# Patient Record
Sex: Male | Born: 1960 | Race: White | Hispanic: No | Marital: Married | Smoking: Never smoker
Health system: Southern US, Community
[De-identification: ages and names within clinical notes are randomized; demographics above are authoritative.]

## PROBLEM LIST (undated history)

## (undated) DIAGNOSIS — R1084 Generalized abdominal pain: Secondary | ICD-10-CM

## (undated) DIAGNOSIS — K759 Inflammatory liver disease, unspecified: Secondary | ICD-10-CM

## (undated) DIAGNOSIS — E7800 Pure hypercholesterolemia, unspecified: Secondary | ICD-10-CM

## (undated) DIAGNOSIS — C801 Malignant (primary) neoplasm, unspecified: Secondary | ICD-10-CM

## (undated) DIAGNOSIS — K219 Gastro-esophageal reflux disease without esophagitis: Secondary | ICD-10-CM

## (undated) DIAGNOSIS — I739 Peripheral vascular disease, unspecified: Secondary | ICD-10-CM

## (undated) DIAGNOSIS — E785 Hyperlipidemia, unspecified: Secondary | ICD-10-CM

## (undated) DIAGNOSIS — E43 Unspecified severe protein-calorie malnutrition: Secondary | ICD-10-CM

## (undated) DIAGNOSIS — J189 Pneumonia, unspecified organism: Secondary | ICD-10-CM

## (undated) DIAGNOSIS — Z8614 Personal history of Methicillin resistant Staphylococcus aureus infection: Secondary | ICD-10-CM

## (undated) DIAGNOSIS — Z98811 Dental restoration status: Secondary | ICD-10-CM

## (undated) DIAGNOSIS — R221 Localized swelling, mass and lump, neck: Secondary | ICD-10-CM

## (undated) DIAGNOSIS — I10 Essential (primary) hypertension: Secondary | ICD-10-CM

## (undated) DIAGNOSIS — J302 Other seasonal allergic rhinitis: Secondary | ICD-10-CM

## (undated) DIAGNOSIS — E559 Vitamin D deficiency, unspecified: Secondary | ICD-10-CM

## (undated) DIAGNOSIS — R011 Cardiac murmur, unspecified: Secondary | ICD-10-CM

## (undated) DIAGNOSIS — R2231 Localized swelling, mass and lump, right upper limb: Secondary | ICD-10-CM

## (undated) DIAGNOSIS — Z8619 Personal history of other infectious and parasitic diseases: Secondary | ICD-10-CM

## (undated) DIAGNOSIS — Z973 Presence of spectacles and contact lenses: Secondary | ICD-10-CM

## (undated) DIAGNOSIS — E039 Hypothyroidism, unspecified: Secondary | ICD-10-CM

## (undated) HISTORY — DX: Hyperlipidemia, unspecified: E78.5

## (undated) HISTORY — DX: Hypothyroidism, unspecified: E03.9

## (undated) HISTORY — DX: Cardiac murmur, unspecified: R01.1

## (undated) HISTORY — PX: HYDROCELE EXCISION: SHX482

## (undated) HISTORY — DX: Essential (primary) hypertension: I10

## (undated) HISTORY — DX: Pure hypercholesterolemia, unspecified: E78.00

---
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
## 1998-03-13 ENCOUNTER — Ambulatory Visit (HOSPITAL_COMMUNITY): Admission: RE | Admit: 1998-03-13 | Discharge: 1998-03-13 | Payer: Self-pay | Admitting: *Deleted

## 2000-05-07 ENCOUNTER — Ambulatory Visit (HOSPITAL_COMMUNITY): Admission: RE | Admit: 2000-05-07 | Discharge: 2000-05-07 | Payer: Self-pay | Admitting: Gastroenterology

## 2000-05-07 ENCOUNTER — Encounter: Payer: Self-pay | Admitting: Gastroenterology

## 2001-08-03 ENCOUNTER — Encounter: Payer: Self-pay | Admitting: Surgery

## 2001-08-03 ENCOUNTER — Encounter: Admission: RE | Admit: 2001-08-03 | Discharge: 2001-08-03 | Payer: Self-pay | Admitting: Surgery

## 2003-04-16 HISTORY — PX: HYDROCELE EXCISION: SHX482

## 2004-01-06 ENCOUNTER — Ambulatory Visit (HOSPITAL_BASED_OUTPATIENT_CLINIC_OR_DEPARTMENT_OTHER): Admission: RE | Admit: 2004-01-06 | Discharge: 2004-01-06 | Payer: Self-pay | Admitting: Urology

## 2004-01-06 ENCOUNTER — Ambulatory Visit (HOSPITAL_COMMUNITY): Admission: RE | Admit: 2004-01-06 | Discharge: 2004-01-06 | Payer: Self-pay | Admitting: Urology

## 2010-11-15 ENCOUNTER — Other Ambulatory Visit: Payer: Self-pay | Admitting: Emergency Medicine

## 2012-05-27 ENCOUNTER — Other Ambulatory Visit: Payer: Self-pay | Admitting: Internal Medicine

## 2012-05-27 DIAGNOSIS — R7989 Other specified abnormal findings of blood chemistry: Secondary | ICD-10-CM

## 2012-06-01 ENCOUNTER — Other Ambulatory Visit: Payer: Self-pay

## 2013-02-11 ENCOUNTER — Encounter: Payer: Self-pay | Admitting: Internal Medicine

## 2013-02-22 ENCOUNTER — Ambulatory Visit: Payer: BC Managed Care – PPO | Admitting: Internal Medicine

## 2013-02-22 ENCOUNTER — Encounter: Payer: Self-pay | Admitting: Internal Medicine

## 2013-02-22 VITALS — BP 122/84 | HR 64 | Temp 98.2°F | Resp 18 | Ht 68.5 in | Wt 151.2 lb

## 2013-02-22 DIAGNOSIS — K589 Irritable bowel syndrome without diarrhea: Secondary | ICD-10-CM

## 2013-02-22 DIAGNOSIS — E039 Hypothyroidism, unspecified: Secondary | ICD-10-CM | POA: Insufficient documentation

## 2013-02-22 DIAGNOSIS — Q2381 Bicuspid aortic valve: Secondary | ICD-10-CM | POA: Insufficient documentation

## 2013-02-22 DIAGNOSIS — Q231 Congenital insufficiency of aortic valve: Secondary | ICD-10-CM | POA: Insufficient documentation

## 2013-02-22 DIAGNOSIS — Z8774 Personal history of (corrected) congenital malformations of heart and circulatory system: Secondary | ICD-10-CM

## 2013-02-22 DIAGNOSIS — R7401 Elevation of levels of liver transaminase levels: Secondary | ICD-10-CM

## 2013-02-22 DIAGNOSIS — Z113 Encounter for screening for infections with a predominantly sexual mode of transmission: Secondary | ICD-10-CM

## 2013-02-22 DIAGNOSIS — Z1212 Encounter for screening for malignant neoplasm of rectum: Secondary | ICD-10-CM

## 2013-02-22 DIAGNOSIS — R030 Elevated blood-pressure reading, without diagnosis of hypertension: Secondary | ICD-10-CM

## 2013-02-22 DIAGNOSIS — E559 Vitamin D deficiency, unspecified: Secondary | ICD-10-CM | POA: Insufficient documentation

## 2013-02-22 DIAGNOSIS — Z0000 Encounter for general adult medical examination without abnormal findings: Secondary | ICD-10-CM

## 2013-02-22 LAB — CBC WITH DIFFERENTIAL/PLATELET
Basophils Absolute: 0 10*3/uL (ref 0.0–0.1)
Basophils Relative: 0 % (ref 0–1)
Eosinophils Absolute: 0.1 10*3/uL (ref 0.0–0.7)
Eosinophils Relative: 3 % (ref 0–5)
HCT: 44.5 % (ref 39.0–52.0)
Hemoglobin: 15.5 g/dL (ref 13.0–17.0)
Lymphocytes Relative: 25 % (ref 12–46)
Lymphs Abs: 1.3 10*3/uL (ref 0.7–4.0)
MCH: 30.5 pg (ref 26.0–34.0)
MCHC: 34.8 g/dL (ref 30.0–36.0)
MCV: 87.4 fL (ref 78.0–100.0)
Monocytes Absolute: 0.3 10*3/uL (ref 0.1–1.0)
Monocytes Relative: 6 % (ref 3–12)
Neutro Abs: 3.4 10*3/uL (ref 1.7–7.7)
Neutrophils Relative %: 66 % (ref 43–77)
Platelets: 227 10*3/uL (ref 150–400)
RBC: 5.09 MIL/uL (ref 4.22–5.81)
RDW: 13.6 % (ref 11.5–15.5)
WBC: 5.2 10*3/uL (ref 4.0–10.5)

## 2013-02-22 NOTE — Progress Notes (Signed)
Patient ID: Randy Allen, male   DOB: 22-Apr-1960, 52 y.o.   MRN: [id number]   HPI Very nice 52 yo MWM presents for complete physical.  Patient's BP has been controlled at home. Patient denies any cardiac Symptoms as chest pain, palpitations, shortness of breath, dizziness or ankle swelling.  Patient's hyperlipidemia is controlled with diet and medications. Patient denies myalgias or other medication SE's. Last cholesterol last visit was 183 , triglycerides 83 , and LDL 112 .   Patient has no hx/0  prediabetes/insulin resistance with last A1c  5.5% in 2012. Patient denies reactive hypoglycemic symptoms, visual blurring, diabetic polys, or paresthesias.   Other problems include Vitamin D Deficiency for which patient is supplementing Vitamin D.    Current outpatient prescriptions:ALPRAZolam (XANAX) 1 MG tablet, Take 1 mg by mouth 3 (three) times daily as needed for sleep., Disp: , Rfl: ;  atorvastatin (LIPITOR) 40 MG tablet, Take 20 mg by mouth every other day. , Disp: , Rfl: ;  cetirizine (ZYRTEC) 10 MG tablet, Take 10 mg by mouth as needed for allergies., Disp: , Rfl: ;  enalapril (VASOTEC) 20 MG tablet, Take 10 mg by mouth daily. , Disp: , Rfl:  levothyroxine (SYNTHROID, LEVOTHROID) 50 MCG tablet, Take 50 mcg by mouth daily before breakfast., Disp: , Rfl: .   Allergies  Allergen Reactions  . Lexapro [Escitalopram Oxalate]   . Zoloft [Sertraline Hcl]     Past Medical History  Diagnosis Date  . Hepatitis A     age 73    Past Surgical History  Procedure Laterality Date  . Eye surgery  1966    lazy eye  . Wrist surgery  1996    Family History  Problem Relation Age of Onset  . Arthritis Mother   . Depression Mother   . Migraines Mother   . Cancer Father     prostate  . Arthritis Father   . Ulcers Father   . Heart attack Father     History   Social History  . Marital Status: Married    Spouse Name: N/A    Number of Children: N/A  . Years of Education: N/A    Occupational History  . Not on file.   Social History Main Topics  . Smoking status: Never Smoker   . Smokeless tobacco: Not on file  . Alcohol Use: 1 - 1.5 oz/week    2-3 drink(s) per week  . Drug Use: Not on file  . Sexual Activity: Not on file   Other Topics Concern  . Not on file   Social History Narrative  . No narrative on file    SYSTEMS REVIEW Constitutional: Denies fever, chills, weight loss/gain, headaches, insomnia, fatigue, night sweats, and change in appetite. Eyes: Denies redness, blurred vision, diplopia, discharge, itchy, watery eyes.  ENT: Denies discharge, congestion, post nasal drip, epistaxis, sore throat, earache, hearing loss, dental pain, Tinnitus, Vertigo, Sinus pain, snoring.  Cardio: Denies chest pain, palpitations, irregular heartbeat, syncope, dyspnea, diaphoresis, orthopnea, PND, claudication, edema Respiratory: denies cough, dyspnea, DOE, pleurisy, hoarseness, laryngitis, wheezing.  Gastrointestinal: Denies dysphagia, heartburn, reflux, water brash, pain, cramps, nausea, vomiting, bloating, diarrhea, constipation, hematemesis, melena, hematochezia, jaundice, hemorrhoids Genitourinary: Denies dysuria, frequency, urgency, nocturia, hesitancy, discharge, hematuria, flank pain Musculoskeletal: Denies arthralgia, myalgia, stiffness, Jt. Swelling, pain, limp, and strain/sprain. Skin: Denies puritis, rash, hives, warts, acne, eczema, changing in skin lesion Neuro: Weakness, tremor, incoordination, spasms, paresthesia, pain Psychiatric: Denies confusion, memory loss, sensory loss Endocrine: Denies change in weight, skin,  hair change, nocturia, and paresthesia, Diabetic Polys, visual blurring, hyper /hypo glycemic episodes.  Heme/Lymph: Excessive bleeding, bruising, enlarged lymph node   Physical Exam: Filed Vitals:   02/22/13 1104  BP: 122/84  Pulse: 64  Temp: 98.2 F (36.8 C)  Resp: 18    General Appearance: Well nourished, in no apparent  distress. Eyes: PERRLA, EOMs, conjunctiva no swelling or erythema, normal fundi and vessels. Sinuses: No Frontal/maxillary tenderness ENT/Mouth: EACs Patent / TMs  nl. Nares clear without erythema, swelling, mucoid exudates. Good dentition. No erythema, swelling, or exudate on post pharynx. Tongue normal, non-obstructing. Tonsils not swollen or erythematous. Hearing normal.  Neck: Supple, thyroid normal. No bruits or JVD. Respiratory: Respiratory effort normal.  BS equal bilaterally without rales, rhonci, wheezing or stridor. Cardio: Heart sounds normal, regular rate and rhythm without murmurs, rubs or gallops. Peripheral pulses brisk and equal bilaterally, without edema. No aortic or femoral bruits. Chest: symmetric, with normal excursions and percussion. Abdomen: Flat, soft, with bowl sounds. Nontender, no guarding, rebound, hernias, masses, or organomegaly.  Lymphatics: Non tender without lymphadenopathy.  Genitourinary: No hernias. Testes nl. DRE-prostate nl for age Musculoskeletal: Full ROM all peripheral extremities, joint stability, 5/5 strength, and normal gait. Skin: Warm, dry without rashes, lesions, ecchymosis.  Neuro: Cranial nerves intact, reflexes equal bilaterally. Normal muscle tone, no cerebellar symptoms. Sensation intact.  Pysch: Awake and oriented X 3, normal affect, Insight and Judgment appropriate.   Assessment and Plan  1.Hypertension - continue diet, exercise and meds as discussed  2. Hyperlipidemia - continue low cholesterol diet, exercise and meds as discussed  3. Prediabetes/Insulin Resistance - diet, exercise, weight loss as discussed  4. Vitamin D Deficiency - Supplement as discussed   Plan routine screening labs, EKG, hemoccult, Aortoscan

## 2013-02-22 NOTE — Patient Instructions (Signed)
Continue diet and meds as discussed. Further disposition pending results of labs.   Vitamin D Deficiency Vitamin D is an important vitamin that your body needs. Having too little of it in your body is called a deficiency. A very bad deficiency can make your bones soft and can cause a condition called rickets.  Vitamin D is important to your body for different reasons, such as:   It helps your body absorb 2 minerals called calcium and phosphorus.  It helps make your bones healthy.  It may prevent some diseases, such as diabetes and multiple sclerosis.  It helps your muscles and heart. You can get vitamin D in several ways. It is a natural part of some foods. The vitamin is also added to some dairy products and cereals. Some people take vitamin D supplements. Also, your body makes vitamin D when you are in the sun. It changes the sun's rays into a form of the vitamin that your body can use. CAUSES   Not eating enough foods that contain vitamin D.  Not getting enough sunlight.  Having certain digestive system diseases that make it hard to absorb vitamin D. These diseases include Crohn's disease, chronic pancreatitis, and cystic fibrosis.  Having a surgery in which part of the stomach or small intestine is removed.  Being obese. Fat cells pull vitamin D out of your blood. That means that obese people may not have enough vitamin D left in their blood and in other body tissues.  Having chronic kidney or liver disease. RISK FACTORS Risk factors are things that make you more likely to develop a vitamin D deficiency. They include:  Being older.  Not being able to get outside very much.  Living in a nursing home.  Having had broken bones.  Having weak or thin bones (osteoporosis).  Having a disease or condition that changes how your body absorbs vitamin D.  Having dark skin.  Some medicines such as seizure medicines or steroids.  Being overweight or obese. SYMPTOMS Mild cases  of vitamin D deficiency may not have any symptoms. If you have a very bad case, symptoms may include:  Bone pain.  Muscle pain.  Falling often.  Broken bones caused by a minor injury, due to osteoporosis. DIAGNOSIS A blood test is the [place] way to tell if you have a vitamin D deficiency. TREATMENT Vitamin D deficiency can be treated in different ways. Treatment for vitamin D deficiency depends on what is causing it. Options include:  Taking vitamin D supplements.  Taking a calcium supplement. Your caregiver will suggest what dose is [place] for you. HOME CARE INSTRUCTIONS  Take any supplements that your caregiver prescribes. Follow the directions carefully. Take only the suggested amount.  Have your blood tested 2 months after you start taking supplements.  Eat foods that contain vitamin D. Healthy choices include:  Fortified dairy products, cereals, or juices. Fortified means vitamin D has been added to the food. Check the label on the package to be sure.  Fatty fish like salmon or trout.  Eggs.  Oysters.  Do not use a tanning bed.  Keep your weight at a healthy level. Lose weight if you need to.  Keep all follow-up appointments. Your caregiver will need to perform blood tests to make sure your vitamin D deficiency is going away. SEEK MEDICAL CARE IF:  You have any questions about your treatment.  You continue to have symptoms of vitamin D deficiency.  You have nausea or vomiting.  You are   constipated.  You feel confused.  You have severe abdominal or back pain. MAKE SURE YOU:  Understand these instructions.  Will watch your condition.  Will get help right away if you are not doing well or get worse. Document Released: 06/24/2011 Document Revised: 07/27/2012 Document Reviewed: 06/24/2011 [employer] Patient Information 2014 [employer].   Cholesterol Cholesterol is a white, waxy, fat-like protein needed by your body in small amounts. The liver makes all  the cholesterol you need. It is carried from the liver by the blood through the blood vessels. Deposits (plaque) may build up on blood vessel walls. This makes the arteries narrower and stiffer. Plaque increases the risk for heart attack and stroke. You cannot feel your cholesterol level even if it is very high. The only way to know is by a blood test to check your lipid (fats) levels. Once you know your cholesterol levels, you should keep a record of the test results. Work with your caregiver to to keep your levels in the desired range. WHAT THE RESULTS MEAN:  Total cholesterol is a rough measure of all the cholesterol in your blood.  LDL is the so-called bad cholesterol. This is the type that deposits cholesterol in the walls of the arteries. You want this level to be low.  HDL is the good cholesterol because it cleans the arteries and carries the LDL away. You want this level to be high.  Triglycerides are fat that the body can either burn for energy or store. High levels are closely linked to heart disease. DESIRED LEVELS:  Total cholesterol below 200.  LDL below 100 for people at risk, below 70 for very high risk.  HDL above 50 is good, above 60 is [place].  Triglycerides below 150. HOW TO LOWER YOUR CHOLESTEROL:  Diet.  Choose fish or white meat chicken and turkey, roasted or baked. Limit fatty cuts of red meat, fried foods, and processed meats, such as sausage and lunch meat.  Eat lots of fresh fruits and vegetables. Choose whole grains, beans, pasta, potatoes and cereals.  Use only small amounts of olive, corn or canola oils. Avoid butter, mayonnaise, shortening or palm kernel oils. Avoid foods with trans-fats.  Use skim/nonfat milk and low-fat/nonfat yogurt and cheeses. Avoid whole milk, cream, ice cream, egg yolks and cheeses. Healthy desserts include angel food cake, ginger snaps, animal crackers, hard candy, popsicles, and low-fat/nonfat frozen yogurt. Avoid pastries, cakes,  pies and cookies.  Exercise.  A regular program helps decrease LDL and raises HDL.  Helps with weight control.  Do things that increase your activity level like gardening, walking, or taking the stairs.  Medication.  May be prescribed by your caregiver to help lowering cholesterol and the risk for heart disease.  You may need medicine even if your levels are normal if you have several risk factors. HOME CARE INSTRUCTIONS   Follow your diet and exercise programs as suggested by your caregiver.  Take medications as directed.  Have blood work done when your caregiver feels it is necessary. MAKE SURE YOU:   Understand these instructions.  Will watch your condition.  Will get help right away if you are not doing well or get worse. Document Released: 12/25/2000 Document Revised: 06/24/2011 Document Reviewed: 06/17/2007 Exit[employer] Patient Information 2014 [employer].   Hypertension As your heart beats, it forces blood through your arteries. This force is your blood pressure. If the pressure is too high, it is called hypertension (HTN) or high blood pressure. HTN is dangerous because   you may have it and not know it. High blood pressure may mean that your heart has to work harder to pump blood. Your arteries may be narrow or stiff. The extra work puts you at risk for heart disease, stroke, and other problems.  Blood pressure consists of two numbers, a higher number over a lower, 110/72, for example. It is stated as "110 over 72." The ideal is below 120 for the top number (systolic) and under 80 for the bottom (diastolic). Write down your blood pressure today. You should pay close attention to your blood pressure if you have certain conditions such as:  Heart failure.  Prior heart attack.  Diabetes  Chronic kidney disease.  Prior stroke.  Multiple risk factors for heart disease. To see if you have HTN, your blood pressure should be measured while you are seated with your arm  held at the level of the heart. It should be measured at least twice. A one-time elevated blood pressure reading (especially in the Emergency Department) does not mean that you need treatment. There may be conditions in which the blood pressure is different between your right and left arms. It is important to see your caregiver soon for a recheck. Most people have essential hypertension which means that there is not a specific cause. This type of high blood pressure may be lowered by changing lifestyle factors such as:  Stress.  Smoking.  Lack of exercise.  Excessive weight.  Drug/tobacco/alcohol use.  Eating less salt. Most people do not have symptoms from high blood pressure until it has caused damage to the body. Effective treatment can often prevent, delay or reduce that damage. TREATMENT  When a cause has been identified, treatment for high blood pressure is directed at the cause. There are a large number of medications to treat HTN. These fall into several categories, and your caregiver will help you select the medicines that are [place] for you. Medications may have side effects. You should review side effects with your caregiver. If your blood pressure stays high after you have made lifestyle changes or started on medicines,   Your medication(s) may need to be changed.  Other problems may need to be addressed.  Be certain you understand your prescriptions, and know how and when to take your medicine.  Be sure to follow up with your caregiver within the time frame advised (usually within two weeks) to have your blood pressure rechecked and to review your medications.  If you are taking more than one medicine to lower your blood pressure, make sure you know how and at what times they should be taken. Taking two medicines at the same time can result in blood pressure that is too low. SEEK IMMEDIATE MEDICAL CARE IF:  You develop a severe headache, blurred or changing vision, or  confusion.  You have unusual weakness or numbness, or a faint feeling.  You have severe chest or abdominal pain, vomiting, or breathing problems. MAKE SURE YOU:   Understand these instructions.  Will watch your condition.  Will get help right away if you are not doing well or get worse. Document Released: 04/01/2005 Document Revised: 06/24/2011 Document Reviewed: 11/20/2007 [employer] Patient Information 2014 [employer].  

## 2013-02-23 ENCOUNTER — Ambulatory Visit: Payer: Self-pay | Admitting: Gastroenterology

## 2013-02-23 LAB — LIPID PANEL
Cholesterol: 140 mg/dL (ref 0–200)
HDL: 57 mg/dL (ref >39)
LDL Cholesterol: 70 mg/dL (ref 0–99)
Total CHOL/HDL Ratio: 2.5 Ratio
Triglycerides: 64 mg/dL (ref <150)
VLDL: 13 mg/dL (ref 0–40)

## 2013-02-23 LAB — HIV ANTIBODY (ROUTINE TESTING W REFLEX): HIV: NONREACTIVE

## 2013-02-23 LAB — HEPATIC FUNCTION PANEL
ALT: 22 U/L (ref 0–53)
AST: 21 U/L (ref 0–37)
Albumin: 4.6 g/dL (ref 3.5–5.2)
Alkaline Phosphatase: 62 U/L (ref 39–117)
Bilirubin, Direct: 0.2 mg/dL (ref 0.0–0.3)
Indirect Bilirubin: 0.5 mg/dL (ref 0.0–0.9)
Total Bilirubin: 0.7 mg/dL (ref 0.3–1.2)
Total Protein: 6.8 g/dL (ref 6.0–8.3)

## 2013-02-23 LAB — HEMOGLOBIN A1C
Hgb A1c MFr Bld: 5.5 % (ref <5.7)
Mean Plasma Glucose: 111 mg/dL (ref <117)

## 2013-02-23 LAB — URINALYSIS, COMPLETE
Bacteria, UA: NONE SEEN
Bilirubin Urine: NEGATIVE
Casts: NONE SEEN
Crystals: NONE SEEN
Glucose, UA: NEGATIVE mg/dL
Hgb urine dipstick: NEGATIVE
Ketones, ur: NEGATIVE mg/dL
Leukocytes, UA: NEGATIVE
Nitrite: NEGATIVE
Protein, ur: NEGATIVE mg/dL
Specific Gravity, Urine: 1.009 (ref 1.005–1.030)
Squamous Epithelial / HPF: NONE SEEN
Urobilinogen, UA: 0.2 mg/dL (ref 0.0–1.0)
pH: 6.5 (ref 5.0–8.0)

## 2013-02-23 LAB — MICROALBUMIN / CREATININE URINE RATIO
Creatinine, Urine: 31.7 mg/dL
Microalb Creat Ratio: 15.8 mg/g (ref 0.0–30.0)
Microalb, Ur: 0.5 mg/dL (ref 0.00–1.89)

## 2013-02-23 LAB — MAGNESIUM: Magnesium: 2 mg/dL (ref 1.5–2.5)

## 2013-02-23 LAB — PSA: PSA: 0.47 ng/mL (ref <=4.00)

## 2013-02-23 LAB — HEPATITIS A ANTIBODY, TOTAL: Hep A Total Ab: REACTIVE — AB

## 2013-02-23 LAB — BASIC METABOLIC PANEL WITH GFR
BUN: 13 mg/dL (ref 6–23)
CO2: 28 mEq/L (ref 19–32)
Calcium: 9.4 mg/dL (ref 8.4–10.5)
Chloride: 104 mEq/L (ref 96–112)
Creat: 0.95 mg/dL (ref 0.50–1.35)
GFR, Est African American: >89 mL/min
GFR, Est Non African American: >89 mL/min
Glucose, Bld: 85 mg/dL (ref 70–99)
Potassium: 4 mEq/L (ref 3.5–5.3)
Sodium: 139 mEq/L (ref 135–145)

## 2013-02-23 LAB — VITAMIN D 25 HYDROXY (VIT D DEFICIENCY, FRACTURES): Vit D, 25-Hydroxy: 57 ng/mL (ref 30–89)

## 2013-02-23 LAB — VITAMIN B12: Vitamin B-12: 475 pg/mL (ref 211–911)

## 2013-02-23 LAB — RPR

## 2013-02-23 LAB — TSH: TSH: 4.266 u[IU]/mL (ref 0.350–4.500)

## 2013-02-23 LAB — HEPATITIS B CORE ANTIBODY, TOTAL: Hep B Core Total Ab: NONREACTIVE

## 2013-02-23 LAB — TESTOSTERONE: Testosterone: 246 ng/dL — ABNORMAL LOW (ref 300–890)

## 2013-02-23 LAB — INSULIN, FASTING: Insulin fasting, serum: 10 u[IU]/mL (ref 3–28)

## 2013-02-23 LAB — HEPATITIS C ANTIBODY: HCV Ab: NEGATIVE

## 2013-02-23 LAB — HEPATITIS B SURFACE ANTIBODY,QUALITATIVE: Hep B S Ab: NEGATIVE

## 2013-02-24 LAB — TB SKIN TEST
Induration: 0 mm
TB Skin Test: NEGATIVE

## 2013-02-25 LAB — HEPATITIS B E ANTIBODY: Hepatitis Be Antibody: NEGATIVE

## 2013-03-26 ENCOUNTER — Ambulatory Visit: Payer: Self-pay | Admitting: Gastroenterology

## 2013-04-15 ENCOUNTER — Other Ambulatory Visit: Payer: Self-pay | Admitting: Internal Medicine

## 2013-04-15 DIAGNOSIS — Z8614 Personal history of Methicillin resistant Staphylococcus aureus infection: Secondary | ICD-10-CM

## 2013-04-15 HISTORY — DX: Personal history of Methicillin resistant Staphylococcus aureus infection: Z86.14

## 2013-05-18 ENCOUNTER — Other Ambulatory Visit: Payer: Self-pay | Admitting: Emergency Medicine

## 2013-05-18 MED ORDER — ALPRAZOLAM 1 MG PO TABS: ORAL_TABLET | ORAL | Status: DC

## 2013-05-31 ENCOUNTER — Ambulatory Visit: Payer: Self-pay | Admitting: Internal Medicine

## 2013-06-01 ENCOUNTER — Ambulatory Visit: Payer: Self-pay | Admitting: Internal Medicine

## 2013-07-17 ENCOUNTER — Other Ambulatory Visit: Payer: Self-pay | Admitting: Internal Medicine

## 2013-07-20 ENCOUNTER — Other Ambulatory Visit: Payer: Self-pay | Admitting: Emergency Medicine

## 2013-07-20 MED ORDER — ALPRAZOLAM 1 MG PO TABS: ORAL_TABLET | ORAL | Status: DC

## 2013-08-16 ENCOUNTER — Ambulatory Visit (INDEPENDENT_AMBULATORY_CARE_PROVIDER_SITE_OTHER): Payer: BC Managed Care – PPO | Admitting: Internal Medicine

## 2013-08-16 ENCOUNTER — Encounter: Payer: Self-pay | Admitting: Internal Medicine

## 2013-08-16 VITALS — BP 122/84 | HR 64 | Temp 98.2°F | Resp 16 | Ht 69.75 in | Wt 156.0 lb

## 2013-08-16 DIAGNOSIS — Z79899 Other long term (current) drug therapy: Secondary | ICD-10-CM | POA: Insufficient documentation

## 2013-08-16 DIAGNOSIS — R7309 Other abnormal glucose: Secondary | ICD-10-CM | POA: Insufficient documentation

## 2013-08-16 DIAGNOSIS — I10 Essential (primary) hypertension: Secondary | ICD-10-CM | POA: Insufficient documentation

## 2013-08-16 DIAGNOSIS — E782 Mixed hyperlipidemia: Secondary | ICD-10-CM | POA: Insufficient documentation

## 2013-08-16 DIAGNOSIS — E559 Vitamin D deficiency, unspecified: Secondary | ICD-10-CM

## 2013-08-16 LAB — CBC WITH DIFFERENTIAL/PLATELET
Basophils Absolute: 0 10*3/uL (ref 0.0–0.1)
Basophils Relative: 0 % (ref 0–1)
Eosinophils Absolute: 0.1 10*3/uL (ref 0.0–0.7)
Eosinophils Relative: 2 % (ref 0–5)
HCT: 42.2 % (ref 39.0–52.0)
Hemoglobin: 14.8 g/dL (ref 13.0–17.0)
Lymphocytes Relative: 29 % (ref 12–46)
Lymphs Abs: 1.7 10*3/uL (ref 0.7–4.0)
MCH: 30.3 pg (ref 26.0–34.0)
MCHC: 35.1 g/dL (ref 30.0–36.0)
MCV: 86.5 fL (ref 78.0–100.0)
Monocytes Absolute: 0.3 10*3/uL (ref 0.1–1.0)
Monocytes Relative: 6 % (ref 3–12)
Neutro Abs: 3.7 10*3/uL (ref 1.7–7.7)
Neutrophils Relative %: 63 % (ref 43–77)
Platelets: 209 10*3/uL (ref 150–400)
RBC: 4.88 MIL/uL (ref 4.22–5.81)
RDW: 14 % (ref 11.5–15.5)
WBC: 5.8 10*3/uL (ref 4.0–10.5)

## 2013-08-16 NOTE — Patient Instructions (Signed)
 Hypertension As your heart beats, it forces blood through your arteries. This force is your blood pressure. If the pressure is too high, it is called hypertension (HTN) or high blood pressure. HTN is dangerous because you may have it and not know it. High blood pressure may mean that your heart has to work harder to pump blood. Your arteries may be narrow or stiff. The extra work puts you at risk for heart disease, stroke, and other problems.  Blood pressure consists of two numbers, a higher number over a lower, 110/72, for example. It is stated as "110 over 72." The ideal is below 120 for the top number (systolic) and under 80 for the bottom (diastolic). Write down your blood pressure today. You should pay close attention to your blood pressure if you have certain conditions such as:  Heart failure.  Prior heart attack.  Diabetes  Chronic kidney disease.  Prior stroke.  Multiple risk factors for heart disease. To see if you have HTN, your blood pressure should be measured while you are seated with your arm held at the level of the heart. It should be measured at least twice. A one-time elevated blood pressure reading (especially in the Emergency Department) does not mean that you need treatment. There may be conditions in which the blood pressure is different between your right and left arms. It is important to see your caregiver soon for a recheck. Most people have essential hypertension which means that there is not a specific cause. This type of high blood pressure may be lowered by changing lifestyle factors such as:  Stress.  Smoking.  Lack of exercise.  Excessive weight.  Drug/tobacco/alcohol use.  Eating less salt. Most people do not have symptoms from high blood pressure until it has caused damage to the body. Effective treatment can often prevent, delay or reduce that damage. TREATMENT  When a cause has been identified, treatment for high blood pressure is directed at  the cause. There are a large number of medications to treat HTN. These fall into several categories, and your caregiver will help you select the medicines that are [place] for you. Medications may have side effects. You should review side effects with your caregiver. If your blood pressure stays high after you have made lifestyle changes or started on medicines,   Your medication(s) may need to be changed.  Other problems may need to be addressed.  Be certain you understand your prescriptions, and know how and when to take your medicine.  Be sure to follow up with your caregiver within the time frame advised (usually within two weeks) to have your blood pressure rechecked and to review your medications.  If you are taking more than one medicine to lower your blood pressure, make sure you know how and at what times they should be taken. Taking two medicines at the same time can result in blood pressure that is too low. SEEK IMMEDIATE MEDICAL CARE IF:  You develop a severe headache, blurred or changing vision, or confusion.  You have unusual weakness or numbness, or a faint feeling.  You have severe chest or abdominal pain, vomiting, or breathing problems. MAKE SURE YOU:   Understand these instructions.  Will watch your condition.  Will get help right away if you are not doing well or get worse.   Diabetes and Exercise Exercising regularly is important. It is not just about losing weight. It has many health benefits, such as:  Improving your overall fitness, flexibility, and   endurance.  Increasing your bone density.  Helping with weight control.  Decreasing your body fat.  Increasing your muscle strength.  Reducing stress and tension.  Improving your overall health. People with diabetes who exercise gain additional benefits because exercise:  Reduces appetite.  Improves the body's use of blood sugar (glucose).  Helps lower or control blood glucose.  Decreases blood  pressure.  Helps control blood lipids (such as cholesterol and triglycerides).  Improves the body's use of the hormone insulin by:  Increasing the body's insulin sensitivity.  Reducing the body's insulin needs.  Decreases the risk for heart disease because exercising:  Lowers cholesterol and triglycerides levels.  Increases the levels of good cholesterol (such as high-density lipoproteins [HDL]) in the body.  Lowers blood glucose levels. YOUR ACTIVITY PLAN  Choose an activity that you enjoy and set realistic goals. Your health care provider or diabetes educator can help you make an activity plan that works for you. You can break activities into 2 or 3 sessions throughout the day. Doing so is as good as one long session. Exercise ideas include:  Taking the dog for a walk.  Taking the stairs instead of the elevator.  Dancing to your favorite song.  Doing your favorite exercise with a friend. RECOMMENDATIONS FOR EXERCISING WITH TYPE 1 OR TYPE 2 DIABETES   Check your blood glucose before exercising. If blood glucose levels are greater than 240 mg/dL, check for urine ketones. Do not exercise if ketones are present.  Avoid injecting insulin into areas of the body that are going to be exercised. For example, avoid injecting insulin into:  The arms when playing tennis.  The legs when jogging.  Keep a record of:  Food intake before and after you exercise.  Expected peak times of insulin action.  Blood glucose levels before and after you exercise.  The type and amount of exercise you have done.  Review your records with your health care provider. Your health care provider will help you to develop guidelines for adjusting food intake and insulin amounts before and after exercising.  If you take insulin or oral hypoglycemic agents, watch for signs and symptoms of hypoglycemia. They include:  Dizziness.  Shaking.  Sweating.  Chills.  Confusion.  Drink plenty of water  while you exercise to prevent dehydration or heat stroke. Body water is lost during exercise and must be replaced.  Talk to your health care provider before starting an exercise program to make sure it is safe for you. Remember, almost any type of activity is better than none.    Cholesterol Cholesterol is a white, waxy, fat-like protein needed by your body in small amounts. The liver makes all the cholesterol you need. It is carried from the liver by the blood through the blood vessels. Deposits (plaque) may build up on blood vessel walls. This makes the arteries narrower and stiffer. Plaque increases the risk for heart attack and stroke. You cannot feel your cholesterol level even if it is very high. The only way to know is by a blood test to check your lipid (fats) levels. Once you know your cholesterol levels, you should keep a record of the test results. Work with your caregiver to to keep your levels in the desired range. WHAT THE RESULTS MEAN:  Total cholesterol is a rough measure of all the cholesterol in your blood.  LDL is the so-called bad cholesterol. This is the type that deposits cholesterol in the walls of the arteries. You want this   level to be low.  HDL is the good cholesterol because it cleans the arteries and carries the LDL away. You want this level to be high.  Triglycerides are fat that the body can either burn for energy or store. High levels are closely linked to heart disease. DESIRED LEVELS:  Total cholesterol below 200.  LDL below 100 for people at risk, below 70 for very high risk.  HDL above 50 is good, above 60 is [place].  Triglycerides below 150. HOW TO LOWER YOUR CHOLESTEROL:  Diet.  Choose fish or white meat chicken and turkey, roasted or baked. Limit fatty cuts of red meat, fried foods, and processed meats, such as sausage and lunch meat.  Eat lots of fresh fruits and vegetables. Choose whole grains, beans, pasta, potatoes and cereals.  Use only  small amounts of olive, corn or canola oils. Avoid butter, mayonnaise, shortening or palm kernel oils. Avoid foods with trans-fats.  Use skim/nonfat milk and low-fat/nonfat yogurt and cheeses. Avoid whole milk, cream, ice cream, egg yolks and cheeses. Healthy desserts include angel food cake, ginger snaps, animal crackers, hard candy, popsicles, and low-fat/nonfat frozen yogurt. Avoid pastries, cakes, pies and cookies.  Exercise.  A regular program helps decrease LDL and raises HDL.  Helps with weight control.  Do things that increase your activity level like gardening, walking, or taking the stairs.  Medication.  May be prescribed by your caregiver to help lowering cholesterol and the risk for heart disease.  You may need medicine even if your levels are normal if you have several risk factors. HOME CARE INSTRUCTIONS   Follow your diet and exercise programs as suggested by your caregiver.  Take medications as directed.  Have blood work done when your caregiver feels it is necessary. MAKE SURE YOU:   Understand these instructions.  Will watch your condition.  Will get help right away if you are not doing well or get worse.      Vitamin D Deficiency Vitamin D is an important vitamin that your body needs. Having too little of it in your body is called a deficiency. A very bad deficiency can make your bones soft and can cause a condition called rickets.  Vitamin D is important to your body for different reasons, such as:   It helps your body absorb 2 minerals called calcium and phosphorus.  It helps make your bones healthy.  It may prevent some diseases, such as diabetes and multiple sclerosis.  It helps your muscles and heart. You can get vitamin D in several ways. It is a natural part of some foods. The vitamin is also added to some dairy products and cereals. Some people take vitamin D supplements. Also, your body makes vitamin D when you are in the sun. It changes the  sun's rays into a form of the vitamin that your body can use. CAUSES   Not eating enough foods that contain vitamin D.  Not getting enough sunlight.  Having certain digestive system diseases that make it hard to absorb vitamin D. These diseases include Crohn's disease, chronic pancreatitis, and cystic fibrosis.  Having a surgery in which part of the stomach or small intestine is removed.  Being obese. Fat cells pull vitamin D out of your blood. That means that obese people may not have enough vitamin D left in their blood and in other body tissues.  Having chronic kidney or liver disease. RISK FACTORS Risk factors are things that make you more likely to develop a vitamin   D deficiency. They include:  Being older.  Not being able to get outside very much.  Living in a nursing home.  Having had broken bones.  Having weak or thin bones (osteoporosis).  Having a disease or condition that changes how your body absorbs vitamin D.  Having dark skin.  Some medicines such as seizure medicines or steroids.  Being overweight or obese. SYMPTOMS Mild cases of vitamin D deficiency may not have any symptoms. If you have a very bad case, symptoms may include:  Bone pain.  Muscle pain.  Falling often.  Broken bones caused by a minor injury, due to osteoporosis. DIAGNOSIS A blood test is the [place] way to tell if you have a vitamin D deficiency. TREATMENT Vitamin D deficiency can be treated in different ways. Treatment for vitamin D deficiency depends on what is causing it. Options include:  Taking vitamin D supplements.  Taking a calcium supplement. Your caregiver will suggest what dose is [place] for you. HOME CARE INSTRUCTIONS  Take any supplements that your caregiver prescribes. Follow the directions carefully. Take only the suggested amount.  Have your blood tested 2 months after you start taking supplements.  Eat foods that contain vitamin D. Healthy choices  include:  Fortified dairy products, cereals, or juices. Fortified means vitamin D has been added to the food. Check the label on the package to be sure.  Fatty fish like salmon or trout.  Eggs.  Oysters.  Do not use a tanning bed.  Keep your weight at a healthy level. Lose weight if you need to.  Keep all follow-up appointments. Your caregiver will need to perform blood tests to make sure your vitamin D deficiency is going away. SEEK MEDICAL CARE IF:  You have any questions about your treatment.  You continue to have symptoms of vitamin D deficiency.  You have nausea or vomiting.  You are constipated.  You feel confused.  You have severe abdominal or back pain. MAKE SURE YOU:  Understand these instructions.  Will watch your condition.  Will get help right away if you are not doing well or get worse.   

## 2013-08-16 NOTE — Progress Notes (Signed)
Patient ID: Randy Allen, male   DOB: 1960-09-30, 53 y.o.   MRN: [id number]    This very nice 53 y.o. MWM presents for 3 month follow up with Hypertension, Hyperlipidemia, Pre-Diabetes and Vitamin D Deficiency.    HTN predates since 1999. BP has been controlled at home. Today's BP: 122/84 mmHg. Patient also has MVP and bicuspid Aortic valve and has been followed in the past by Dr Rex Kras. Patient denies any cardiac type chest pain, palpitations, dyspnea/orthopnea/PND, dizziness, claudication, or dependent edema.   Hyperlipidemia is controlled with diet & meds. Last Lipid Profile as below was at goal. Patient denies myalgias or other med SE's.    Also, the patient is screened for  PreDiabetes and insulin resistance and last A1c was 5.5% in Oct 2014. Patient denies any symptoms of reactive hypoglycemia, diabetic polys, paresthesias or visual blurring.   Further, Patient has history of Vitamin D Deficiency of 12 in 2008with last vitamin D of49 in Oct 2014. Patient supplements vitamin D without any suspected side-effects.  Medication Sig  . ALPRAZolam (XANAX) 1 MG tablet TAKE ONE-HALF TO ONE TABLET THREE TIMES DAILY AS NEEDED  . atorvastatin (LIPITOR) 40 MG tablet TAKE ONE TABLET  EVERY DAY  . cetirizine (ZYRTEC) 10 MG tablet Take 10 mg  as needed for allergies.  Marland Kitchen enalapril (VASOTEC) 20 MG tablet TAKE ONE TABLET  EVERY DAY  . levothyroxine 50 MCG tablet TAKE ONE TABLET  EVERY DAY    Allergies  Allergen Reactions  . Lexapro [Escitalopram Oxalate]   . Zoloft [Sertraline Hcl]    PMHx:   Past Medical History  Diagnosis Date  . Hepatitis A     age 70   FHx:    Reviewed / unchanged  SHx:    Reviewed / unchanged   Systems Review: Constitutional: Denies fever, chills, wt changes, headaches, insomnia, fatigue, night sweats, change in appetite. Eyes: Denies redness, blurred vision, diplopia, discharge, itchy, watery eyes.  ENT: Denies discharge, congestion, post nasal drip, epistaxis, sore  throat, earache, hearing loss, dental pain, tinnitus, vertigo, sinus pain, snoring.  CV: Denies chest pain, palpitations, irregular heartbeat, syncope, dyspnea, diaphoresis, orthopnea, PND, claudication, edema. Respiratory: denies cough, dyspnea, DOE, pleurisy, hoarseness, laryngitis, wheezing.  Gastrointestinal: Denies dysphagia, odynophagia, heartburn, reflux, water brash, abdominal pain or cramps, nausea, vomiting, bloating, diarrhea, constipation, hematemesis, melena, hematochezia,  or hemorrhoids. Genitourinary: Denies dysuria, frequency, urgency, nocturia, hesitancy, discharge, hematuria, flank pain. Musculoskeletal: Denies arthralgias, myalgias, stiffness, jt. swelling, pain, limp, strain/sprain.  Skin: Denies pruritus, rash, hives, warts, acne, eczema, change in skin lesion(s). Neuro: No weakness, tremor, incoordination, spasms, paresthesia, or pain. Psychiatric: Denies confusion, memory loss, or sensory loss. Endo: Denies change in weight, skin, hair change.  Heme/Lymph: No excessive bleeding, bruising, orenlarged lymph nodes.  Exam:  BP 122/84  Pulse 64  Temp(Src) 98.2 F (36.8 C) (Temporal)  Resp 16  Ht 5' 9.75" (1.772 m)  Wt 156 lb (70.761 kg)  BMI 22.54 kg/m2  Appears well nourished - in no distress. Eyes: PERRLA, EOMs, conjunctiva no swelling or erythema. Sinuses: No frontal/maxillary tenderness ENT/Mouth: EAC's clear, TM's nl w/o erythema, bulging. Nares clear w/o erythema, swelling, exudates. Oropharynx clear without erythema or exudates. Oral hygiene is good. Tongue normal, non obstructing. Hearing intact.  Neck: Supple. Thyroid nl. Car 2+/2+ without bruits, nodes or JVD. Chest: Respirations nl with BS clear & equal w/o rales, rhonchi, wheezing or stridor.  Cor: Heart sounds normal w/ regular rate and rhythm with a YT0/1 systolic [place] radiating widely and  otherwise no gallops, clicks or rubs. Peripheral pulses normal and equal  without edema.  Lymphatics:  Unremarkable.  Musculoskeletal: Full ROM all peripheral extremities, joint stability, 5/5 strength, and normal gait.  Skin: Warm, dry without exposed rashes, lesions, ecchymosis apparent.  Neuro: Cranial nerves intact, reflexes equal bilaterally. Sensory-motor testing grossly intact. Tendon reflexes grossly intact.  Pysch: Alert & oriented x 3. Insight and judgement nl & appropriate. No ideations.  Assessment and Plan:  1. Hypertension - Continue monitor blood pressure at home. Continue diet/meds same  2. Hyperlipidemia - Continue diet/meds, exercise,& lifestyle modifications. Continue monitor periodic cholesterol/liver & renal functions   3. Pre-diabetes - Screening - Continue diet, exercise, lifestyle modifications. Monitor appropriate labs  4. Vitamin D Deficiency - Continue supplementation.  Recommended regular exercise, BP monitoring, weight control, and discussed med and SE's. Recommended labs to assess and monitor clinical status. Further disposition pending results of labs

## 2013-08-17 LAB — HEPATIC FUNCTION PANEL
ALT: 18 U/L (ref 0–53)
AST: 20 U/L (ref 0–37)
Albumin: 4.3 g/dL (ref 3.5–5.2)
Alkaline Phosphatase: 66 U/L (ref 39–117)
Bilirubin, Direct: 0.1 mg/dL (ref 0.0–0.3)
Indirect Bilirubin: 0.5 mg/dL (ref 0.2–1.2)
Total Bilirubin: 0.6 mg/dL (ref 0.2–1.2)
Total Protein: 6.9 g/dL (ref 6.0–8.3)

## 2013-08-17 LAB — LIPID PANEL
Cholesterol: 183 mg/dL (ref 0–200)
HDL: 57 mg/dL (ref >39)
LDL Cholesterol: 108 mg/dL — ABNORMAL HIGH (ref 0–99)
Total CHOL/HDL Ratio: 3.2 Ratio
Triglycerides: 91 mg/dL (ref <150)
VLDL: 18 mg/dL (ref 0–40)

## 2013-08-17 LAB — MAGNESIUM: Magnesium: 2.1 mg/dL (ref 1.5–2.5)

## 2013-08-17 LAB — BASIC METABOLIC PANEL WITH GFR
BUN: 16 mg/dL (ref 6–23)
CO2: 29 mEq/L (ref 19–32)
Calcium: 9.9 mg/dL (ref 8.4–10.5)
Chloride: 103 mEq/L (ref 96–112)
Creat: 1.04 mg/dL (ref 0.50–1.35)
GFR, Est African American: >89 mL/min
GFR, Est Non African American: 82 mL/min
Glucose, Bld: 81 mg/dL (ref 70–99)
Potassium: 4.1 mEq/L (ref 3.5–5.3)
Sodium: 140 mEq/L (ref 135–145)

## 2013-08-17 LAB — VITAMIN D 25 HYDROXY (VIT D DEFICIENCY, FRACTURES): Vit D, 25-Hydroxy: 70 ng/mL (ref 30–89)

## 2013-08-17 LAB — HEMOGLOBIN A1C
Hgb A1c MFr Bld: 5.2 % (ref <5.7)
Mean Plasma Glucose: 103 mg/dL (ref <117)

## 2013-08-17 LAB — INSULIN, FASTING: Insulin fasting, serum: 4 u[IU]/mL (ref 3–28)

## 2013-08-17 LAB — TSH: TSH: 4.406 u[IU]/mL (ref 0.350–4.500)

## 2013-08-30 ENCOUNTER — Ambulatory Visit (INDEPENDENT_AMBULATORY_CARE_PROVIDER_SITE_OTHER): Payer: BC Managed Care – PPO | Admitting: Emergency Medicine

## 2013-08-30 ENCOUNTER — Encounter: Payer: Self-pay | Admitting: Emergency Medicine

## 2013-08-30 VITALS — BP 130/98 | HR 78 | Temp 99.8°F | Resp 16 | Ht 69.5 in | Wt 154.0 lb

## 2013-08-30 DIAGNOSIS — T6391XA Toxic effect of contact with unspecified venomous animal, accidental (unintentional), initial encounter: Secondary | ICD-10-CM

## 2013-08-30 DIAGNOSIS — T63301A Toxic effect of unspecified spider venom, accidental (unintentional), initial encounter: Secondary | ICD-10-CM

## 2013-08-30 MED ORDER — DEXAMETHASONE SODIUM PHOSPHATE 10 MG/ML IJ SOLN
10.0000 mg | Freq: Once | INTRAMUSCULAR | Status: AC
  Administered 2013-08-30: 10 mg via INTRAMUSCULAR

## 2013-08-30 MED ORDER — DOXYCYCLINE HYCLATE 100 MG PO TABS: 100.0000 mg | ORAL_TABLET | Freq: Two times a day (BID) | ORAL | Status: DC

## 2013-08-30 MED ORDER — PREDNISONE 10 MG PO TABS: ORAL_TABLET | ORAL | Status: DC

## 2013-08-30 MED ORDER — CEFTRIAXONE SODIUM 1 G IJ SOLR
1.0000 g | Freq: Once | INTRAMUSCULAR | Status: AC
  Administered 2013-08-30: 1 g via INTRAMUSCULAR

## 2013-08-30 NOTE — Patient Instructions (Signed)
Brown Recluse Spider Bite The brown recluse spider is dark brown or light tan. It has a dark spot shaped like a violin on its back. The whole spider (with legs) can grow to be 1 inch (2.5 centimeters) long. Brown recluse spider bites can be serious and life-threatening. HOME CARE  Do not scratch the bite. Keep the bite clean and covered with a bandage.  Wash the bite every day with warm, soapy water.  Put ice on the bite.  Put ice in a plastic bag.  Place a towel between your skin and the bag.  Leave the ice on for 20 to 30 minutes, 3 to 4 times a day, or as told by your doctor.  Keep the bite raised (elevated) above the level of your heart.  Only take medicines as told by your doctor. You may need a tetanus shot if:  You cannot remember when you had your last tetanus shot.  You have never had a tetanus shot.  The injury broke your skin. If you need a tetanus shot and you choose not to have one, you may get tetanus. Sickness from tetanus can be serious. GET HELP RIGHT AWAY IF:  Your sore (lesion) is getting larger (more than  inch [5 millimeters]), growing deeper, or looks infected.  You have chills or a fever.  You feel sick to your stomach (nauseous) or throw up (vomit).  You have muscle aches, shaking (convulsions), or a red rash.  You feel weak or very tired.  You are producing less pee (urine).  You have blood in your pee, or you notice other bleeding.  Your skin turns yellow.  Your problems do not get better after 24 hours, or they get worse.  You have more pain in the bite area. MAKE SURE YOU:   Understand these instructions.  Will watch your condition.  Will get help right away if you are not doing well or get worse. Document Released: 03/21/2011 Document Revised: 06/24/2011 Document Reviewed: 03/21/2011 [facility] Patient Information 2014 [place], [place]. Spider Bite Most spider bites do not cause serious problems. HOME CARE  Do not scratch the  bite.  Keep the bite clean and dry. Wash the bite with soap and water as told by your doctor.  Put ice on the bite.  Put ice in a plastic bag.  Place a towel between your skin and the bag.  Leave the ice on for 20 minutes. Do this 4 times a day for the first 2 to 3 days or as told by your doctor.  Raise (elevate) the bite above your heart.  Only take medicine as told by your doctor.  If you are given medicines (antibiotics), take them as told. Finish them even if you start to feel better. You may need a tetanus shot if:  You cannot remember when you had your last tetanus shot.  You have never had a tetanus shot.  The bite broke your skin. If you need a tetanus shot and you choose not to have one, you may get tetanus. Sickness from tetanus can be serious. GET HELP RIGHT AWAY IF:  Your bite turns purple.  Your bite gets more puffy (swollen), painful, or red.  You are short of breath or have chest pain.  You have muscle cramps or painful muscle spasms.  You have belly (abdominal) pain.  You feel sick to your stomach (nauseous) or throw up (vomit).  You feel very tired or sleepy.  Your bite is not better after 3 days  of treatment. MAKE SURE YOU:  Understand these instructions.  Will watch your condition.  Will get help right away if you are not doing well or get worse. Document Released: 05/04/2010 Document Revised: 06/24/2011 Document Reviewed: 10/31/2010 [facility] Patient Information 2014 [place], [place].

## 2013-08-30 NOTE — Progress Notes (Signed)
   Subjective:    Patient ID: Randy Allen, male    DOB: 10-04-1960, 53 y.o.   MRN: [id number]  HPI Comments: 53 YO WM with concern for probable spider bite >2 days ago on left index finger. He noticed bite in evening with mild irritation but has had increased swelling and discomfort over last 2 days. He denies fever or loss of sensation but notes mild difficulty with ROM with increased swelling around bite.  He has cleaned area and notes actual bite opening does not seem to be getting worse.   He notes BP elevated with concern over bite.    Medication List       This list is accurate as of: 08/30/13  2:02 PM.  Always use your most recent med list.               ALPRAZolam 1 MG tablet  Commonly known as:  XANAX  TAKE ONE-HALF TO ONE TABLET BY MOUTH THREE TIMES DAILY AS NEEDED     atorvastatin 40 MG tablet  Commonly known as:  LIPITOR  TAKE ONE TABLET BY MOUTH EVERY DAY     cetirizine 10 MG tablet  Commonly known as:  ZYRTEC  Take 10 mg by mouth as needed for allergies.     enalapril 20 MG tablet  Commonly known as:  VASOTEC  TAKE ONE TABLET BY MOUTH EVERY DAY     levothyroxine 50 MCG tablet  Commonly known as:  SYNTHROID, LEVOTHROID  TAKE ONE TABLET BY MOUTH EVERY DAY       Allergies  Allergen Reactions  . Lexapro [Escitalopram Oxalate]   . Zoloft [Sertraline Hcl]    Past Medical History  Diagnosis Date  . Hepatitis A     age 29     Review of Systems  Skin: Positive for wound.  All other systems reviewed and are negative.  BP 130/98  Pulse 78  Temp(Src) 99.8 F (37.7 C) (Temporal)  Resp 16  Ht 5' 9.5" (1.765 m)  Wt 154 lb (69.854 kg)  BMI 22.42 kg/m2     Objective:   Physical Exam  Nursing note and vitals reviewed. Constitutional: He is oriented to person, place, and time. He appears well-developed and well-nourished.  Musculoskeletal: He exhibits edema and tenderness.       Hands: Decreased ROM of left index finger from MIP to palm with edema    Neurological: He is alert and oriented to person, place, and time. He has normal reflexes. No cranial nerve deficit. Coordination normal.  Skin: Skin is warm. There is erythema.  Psychiatric: He has a normal mood and affect. Judgment normal.          Assessment & Plan:  Probable Spider Bite- Rocephin 1 gm/ Dexa !0 mg given in office. Start Doxy 100 mg BID AD, If no change add Pred DP 10 mg AD tomorrow. If symptoms increase ER ASAP. Advised epsom salt soaks BID, followed by ICE and Elevation.

## 2013-09-05 ENCOUNTER — Encounter: Payer: Self-pay | Admitting: Emergency Medicine

## 2013-09-14 ENCOUNTER — Other Ambulatory Visit: Payer: Self-pay

## 2013-09-14 DIAGNOSIS — Z1211 Encounter for screening for malignant neoplasm of colon: Secondary | ICD-10-CM

## 2013-09-30 ENCOUNTER — Other Ambulatory Visit: Payer: Self-pay | Admitting: Emergency Medicine

## 2013-09-30 MED ORDER — SILDENAFIL CITRATE 100 MG PO TABS: 100.0000 mg | ORAL_TABLET | ORAL | Status: DC | PRN

## 2013-10-05 ENCOUNTER — Ambulatory Visit (AMBULATORY_SURGERY_CENTER): Payer: BC Managed Care – PPO | Admitting: *Deleted

## 2013-10-05 VITALS — Ht 69.0 in | Wt 157.6 lb

## 2013-10-05 DIAGNOSIS — Z1211 Encounter for screening for malignant neoplasm of colon: Secondary | ICD-10-CM

## 2013-10-05 MED ORDER — NA SULFATE-K SULFATE-MG SULF 17.5-3.13-1.6 GM/177ML PO SOLN: 1.0000 | Freq: Once | ORAL | Status: DC

## 2013-10-05 NOTE — Progress Notes (Signed)
No allergies to eggs or soy. No problems with anesthesia.  Pt given Emmi instructions for colonoscopy  No oxygen use  No diet drug use  

## 2013-10-06 ENCOUNTER — Encounter: Payer: Self-pay | Admitting: Gastroenterology

## 2013-10-21 ENCOUNTER — Ambulatory Visit (AMBULATORY_SURGERY_CENTER): Payer: BC Managed Care – PPO | Admitting: Gastroenterology

## 2013-10-21 ENCOUNTER — Encounter: Payer: Self-pay | Admitting: Gastroenterology

## 2013-10-21 VITALS — BP 106/79 | HR 65 | Temp 97.6°F | Resp 16 | Ht 69.0 in | Wt 157.0 lb

## 2013-10-21 DIAGNOSIS — Z1211 Encounter for screening for malignant neoplasm of colon: Secondary | ICD-10-CM

## 2013-10-21 DIAGNOSIS — K5730 Diverticulosis of large intestine without perforation or abscess without bleeding: Secondary | ICD-10-CM

## 2013-10-21 HISTORY — PX: COLONOSCOPY WITH PROPOFOL: SHX5780

## 2013-10-21 MED ORDER — SODIUM CHLORIDE 0.9 % IV SOLN: 500.0000 mL | INTRAVENOUS | Status: DC

## 2013-10-21 NOTE — Op Note (Addendum)
[place]  [address] [place], [address]   COLONOSCOPY PROCEDURE REPORT  PATIENT: Randy, Allen  MR#: [id number] BIRTHDATE: 1961-01-03 , 52  yrs. old GENDER: Male ENDOSCOPIST: Inda Castle, MD REFERRED MB:[id number] Melford Aase, M.D. PROCEDURE DATE:  10/21/2013 PROCEDURE:   Colonoscopy, diagnostic First Screening Colonoscopy - Avg.  risk and is 50 yrs.  old or older - No.  Prior Negative Screening - Now for repeat screening. 10 or more years since last screening  History of Adenoma - Now for follow-up colonoscopy & has been > or = to 3 yrs.  N/A  Polyps Removed Today? No.  Recommend repeat exam, <10 yrs? No. ASA CLASS:   Class II INDICATIONS:Average risk patient for colon cancer. MEDICATIONS: MAC sedation, administered by CRNA and Propofol (Diprivan) 240 mg IV  DESCRIPTION OF PROCEDURE:   After the risks benefits and alternatives of the procedure were thoroughly explained, informed consent was obtained.  A digital rectal exam revealed several skin tags.   The LB 1528  endoscope was introduced through the anus and advanced to the terminal ileum which was intubated for a short distance. No adverse events experienced.   The quality of the prep was excellent using Suprep  The instrument was then slowly withdrawn as the colon was fully examined.      COLON FINDINGS: Mild diverticulosis was noted in the sigmoid colon. The colon was otherwise normal.  There was no diverticulosis, inflammation, polyps or cancers unless previously stated. Retroflexed views revealed no abnormalities. The time to cecum=1 minutes 47 seconds.  Withdrawal time=6 minutes 03 seconds.  The scope was withdrawn and the procedure completed. COMPLICATIONS: There were no complications.  ENDOSCOPIC IMPRESSION: 1.   Mild diverticulosis was noted in the sigmoid colon 2.   The colon was otherwise normal  RECOMMENDATIONS: Continue current colorectal screening recommendations for  "routine risk" patients with a repeat colonoscopy in 10 years.   eSigned:  Inda Castle, MD 10/21/2013 3:01 PM   cc:   PATIENT NAME:  Randy, Allen MR#: [id number]

## 2013-10-21 NOTE — Progress Notes (Signed)
A/ox3, pleased with MAC, report to RN 

## 2013-10-21 NOTE — Patient Instructions (Signed)
YOU HAD AN ENDOSCOPIC PROCEDURE TODAY AT [facility]: Refer to the procedure report that was given to you for any specific questions about what was found during the examination.  If the procedure report does not answer your questions, please call your gastroenterologist to clarify.  If you requested that your care partner not be given the details of your procedure findings, then the procedure report has been included in a sealed envelope for you to review at your convenience later.  YOU SHOULD EXPECT: Some feelings of bloating in the abdomen. Passage of more gas than usual.  Walking can help get rid of the air that was put into your GI tract during the procedure and reduce the bloating. If you had a lower endoscopy (such as a colonoscopy or flexible sigmoidoscopy) you may notice spotting of blood in your stool or on the toilet paper. If you underwent a bowel prep for your procedure, then you may not have a normal bowel movement for a few days.  DIET: Your first meal following the procedure should be a light meal and then it is ok to progress to your normal diet.  A half-sandwich or bowl of soup is an example of a good first meal.  Heavy or fried foods are harder to digest and may make you feel nauseous or bloated.  Likewise meals heavy in dairy and vegetables can cause extra gas to form and this can also increase the bloating.  Drink plenty of fluids but you should avoid alcoholic beverages for 24 hours. Try to eat plenty of fiber.  ACTIVITY: Your care partner should take you home directly after the procedure.  You should plan to take it easy, moving slowly for the rest of the day.  You can resume normal activity the day after the procedure however you should NOT DRIVE or use heavy machinery for 24 hours (because of the sedation medicines used during the test).    SYMPTOMS TO REPORT IMMEDIATELY: A gastroenterologist can be reached at any hour.  During normal business hours, 8:30 AM to 5:00  PM Monday through Friday, call [phone].  After hours and on weekends, please call the GI answering service at [phone] who will take a message and have the physician on call contact you.   Following lower endoscopy (colonoscopy or flexible sigmoidoscopy):  Excessive amounts of blood in the stool  Significant tenderness or worsening of abdominal pains  Swelling of the abdomen that is new, acute  Fever of 100F or higher  FOLLOW UP: If any biopsies were taken you will be contacted by phone or by letter within the next 1-3 weeks.  Call your gastroenterologist if you have not heard about the biopsies in 3 weeks.  Our staff will call the home number listed on your records the next business day following your procedure to check on you and address any questions or concerns that you may have at that time regarding the information given to you following your procedure. This is a courtesy call and so if there is no answer at the home number and we have not heard from you through the emergency physician on call, we will assume that you have returned to your regular daily activities without incident.  Read handouts given to you by your recovery room nurse.  SIGNATURES/CONFIDENTIALITY: You and/or your care partner have signed paperwork which will be entered into your electronic medical record.  These signatures attest to the fact that that the information above on your  After Visit Summary has been reviewed and is understood.  Full responsibility of the confidentiality of this discharge information lies with you and/or your care-partner.

## 2013-10-22 ENCOUNTER — Telehealth: Payer: Self-pay | Admitting: *Deleted

## 2013-10-22 NOTE — Telephone Encounter (Signed)
  Follow up Call-  Call back number 10/21/2013  Post procedure Call Back phone  # [phone]  Permission to leave phone message Yes     Patient questions:  Do you have a fever, pain , or abdominal swelling? No. Pain Score  0 *  Have you tolerated food without any problems?yes  Have you been able to return to your normal activities? Yes.    Do you have any questions about your discharge instructions: Diet   No. Medications  No. Follow up visit  No.  Do you have questions or concerns about your Care? No.  Actions: * If pain score is 4 or above: No action needed, pain <4.

## 2013-10-27 ENCOUNTER — Encounter: Payer: Self-pay | Admitting: Emergency Medicine

## 2013-10-27 ENCOUNTER — Ambulatory Visit (INDEPENDENT_AMBULATORY_CARE_PROVIDER_SITE_OTHER): Payer: BC Managed Care – PPO | Admitting: Emergency Medicine

## 2013-10-27 VITALS — BP 126/88 | HR 74 | Temp 98.6°F | Resp 16 | Ht 69.75 in | Wt 155.0 lb

## 2013-10-27 DIAGNOSIS — T63301A Toxic effect of unspecified spider venom, accidental (unintentional), initial encounter: Secondary | ICD-10-CM

## 2013-10-27 DIAGNOSIS — N390 Urinary tract infection, site not specified: Secondary | ICD-10-CM

## 2013-10-27 MED ORDER — DEXAMETHASONE SODIUM PHOSPHATE 100 MG/10ML IJ SOLN
10.0000 mg | Freq: Once | INTRAMUSCULAR | Status: AC
  Administered 2013-10-27: 10 mg via INTRAMUSCULAR

## 2013-10-27 MED ORDER — CEFTRIAXONE SODIUM 1 G IJ SOLR
1.0000 g | Freq: Once | INTRAMUSCULAR | Status: AC
  Administered 2013-10-27: 1 g via INTRAMUSCULAR

## 2013-10-27 MED ORDER — DOXYCYCLINE HYCLATE 100 MG PO TABS: 100.0000 mg | ORAL_TABLET | Freq: Two times a day (BID) | ORAL | Status: DC

## 2013-10-27 NOTE — Patient Instructions (Signed)
Brown Recluse Spider Bite A brown recluse spider may be dark brown to light tan in color. It has a band of darker color shaped like a violin on its back. The whole spider (with legs) may grow to the size of 1 inch (2.5 cm). These spiders live undercover, outdoors, and in out-of-the-way places indoors. They can be found in the [place] on the [place], [place], and mostly in the [place]. Brown recluse spider bites can be serious and life-threatening. SYMPTOMS  Symptoms can get worse over several days and may include:  Pain at the bite site. This may begin as a small, painful blister with redness around it. The pain and sore (lesion) caused by the bite can increase and spread over time. This may result in an area of tissue death up to 12 inches (30 cm) wide.  General feeling of illness (malaise).  Nausea and vomiting.  Fever.  Body aches. TREATMENT  Your caregiver may prescribe a drug to prevent tissue death or to treat your lesion. If a large lesion develops, surgery may be needed to remove the damaged tissue. Certain medicines and antibiotics may be prescribed depending on the severity of your illness. However, there is no single antidote to treat this bite. Treatment focuses on caring for your wound. HOME CARE INSTRUCTIONS   Do not scratch the bite area. Keep the area clean and covered with an adhesive bandage or sterile gauze bandage.  Wash the area daily in warm, soapy water.  Put ice or cool compresses on the bite area.  Put ice in a plastic bag.  Place a towel between your skin and the bag.  Leave the ice on for 20 to 30 minutes, 3 to 4 times a day or as directed.  Keep the bite area elevated above the level of your heart. This helps reduce swelling.  Take medicines as directed by your caregiver. You may need a tetanus shot if:  You cannot remember when you had your last tetanus shot.  You have never had a tetanus shot.  The injury broke your skin. If you get a tetanus  shot, your arm may swell, get red, and feel warm to the touch. This is common and not a problem. If you need a tetanus shot and you choose not to have one, there is a rare chance of getting tetanus. Sickness from tetanus can be serious. SEEK MEDICAL CARE IF:   Your symptoms do not improve in 24 hours or are getting worse.  You have increasing pain in the bite area. SEEK IMMEDIATE MEDICAL CARE IF:   Your lesion appears to be getting larger (more than  inch [5 mm]), growing deeper, or looks infected.  You have chills or a fever.  You feel nauseous, vomit, have muscle aches, weakness, extreme tiredness, convulsions, or a red rash.  Your urine output decreases.  You have blood in your urine or notice other unusual bleeding.  Your skin turns yellow. MAKE SURE YOU:   Understand these instructions.  Will watch your condition.  Will get help right away if you are not doing well or get worse. Document Released: 04/01/2005 Document Revised: 06/24/2011 Document Reviewed: 10/31/2010 [facility] Patient Information 2015 [place], [place]. This information is not intended to replace advice given to you by your health care provider. Make sure you discuss any questions you have with your health care provider.

## 2013-10-27 NOTE — Progress Notes (Signed)
   Subjective:    Patient ID: Randy Allen, male    DOB: 10/20/1960, 53 y.o.   MRN: [id number]  HPI Comments: 53 yo male with history of probable spider bite on hand 2 months ago now with bite on right great toe. He noticed swelling on Monday and redness with pus on TUE. He thinks he may have been bitten on SUN when he was working in basement. He notes full recovery from previous bite.     Medication List       This list is accurate as of: 10/27/13 12:12 PM.  Always use your most recent med list.               ALPRAZolam 1 MG tablet  Commonly known as:  XANAX  TAKE ONE-HALF TO ONE TABLET BY MOUTH THREE TIMES DAILY AS NEEDED     atorvastatin 40 MG tablet  Commonly known as:  LIPITOR  TAKE ONE TABLET BY MOUTH EVERY DAY     cetirizine 10 MG tablet  Commonly known as:  ZYRTEC  Take 10 mg by mouth as needed for allergies.     doxycycline 100 MG tablet  Commonly known as:  VIBRA-TABS  Take 1 tablet (100 mg total) by mouth 2 (two) times daily.     enalapril 20 MG tablet  Commonly known as:  VASOTEC  TAKE ONE TABLET BY MOUTH EVERY DAY     levothyroxine 50 MCG tablet  Commonly known as:  SYNTHROID, LEVOTHROID  TAKE ONE TABLET BY MOUTH EVERY DAY       Allergies  Allergen Reactions  . Zoloft [Sertraline Hcl] Shortness Of Breath    jittery  . Lexapro [Escitalopram Oxalate]     Per pt: unknown   Past Medical History  Diagnosis Date  . Hepatitis A     age 63  . Allergy   . Heart murmur   . Hypertension   . Hyperlipidemia   . Hypothyroidism       Review of Systems  Skin: Positive for wound.  All other systems reviewed and are negative.  BP 126/88  Pulse 74  Temp(Src) 98.6 F (37 C) (Temporal)  Resp 16  Ht 5' 9.75" (1.772 m)  Wt 155 lb (70.308 kg)  BMI 22.39 kg/m2     Objective:   Physical Exam  Nursing note and vitals reviewed. Constitutional: He appears well-developed and well-nourished.  Musculoskeletal: Normal range of motion. He exhibits edema.   Neurological: He is alert.  Skin: Skin is warm and dry. There is erythema.  Right great toe with erythematous with edema of entire toe, with 5 mm area of yellowing mid toe          Assessment & Plan:  1. Right great toe probably spider bite- area cleaned with Alcohol and #10 blade used to make small incision with yellow exudate expressed and sent to pathology. Rocephin 1 gm, Decadron 10 mg IM given. Doxy 100 mg AD, continue epsom salt soaks. w/c if SX increase or ER. Advised get house sprayed for spiders, keep leg elevated as much as possible.

## 2013-10-30 LAB — WOUND CULTURE
Gram Stain: NONE SEEN
Gram Stain: NONE SEEN
Gram Stain: NONE SEEN

## 2013-10-31 ENCOUNTER — Other Ambulatory Visit: Payer: Self-pay | Admitting: Emergency Medicine

## 2013-10-31 MED ORDER — MUPIROCIN CALCIUM 2 % EX CREA: TOPICAL_CREAM | CUTANEOUS | Status: DC

## 2013-11-16 ENCOUNTER — Ambulatory Visit: Payer: Self-pay | Admitting: Emergency Medicine

## 2013-11-22 ENCOUNTER — Ambulatory Visit (INDEPENDENT_AMBULATORY_CARE_PROVIDER_SITE_OTHER): Payer: BC Managed Care – PPO | Admitting: Physician Assistant

## 2013-11-22 ENCOUNTER — Encounter: Payer: Self-pay | Admitting: Physician Assistant

## 2013-11-22 VITALS — BP 110/78 | HR 68 | Temp 98.2°F | Resp 16 | Ht 69.0 in | Wt 155.0 lb

## 2013-11-22 DIAGNOSIS — E559 Vitamin D deficiency, unspecified: Secondary | ICD-10-CM

## 2013-11-22 DIAGNOSIS — E782 Mixed hyperlipidemia: Secondary | ICD-10-CM

## 2013-11-22 DIAGNOSIS — E039 Hypothyroidism, unspecified: Secondary | ICD-10-CM

## 2013-11-22 DIAGNOSIS — R7309 Other abnormal glucose: Secondary | ICD-10-CM

## 2013-11-22 DIAGNOSIS — I10 Essential (primary) hypertension: Secondary | ICD-10-CM

## 2013-11-22 DIAGNOSIS — Z79899 Other long term (current) drug therapy: Secondary | ICD-10-CM

## 2013-11-22 LAB — CBC WITH DIFFERENTIAL/PLATELET
Basophils Absolute: 0 10*3/uL (ref 0.0–0.1)
Basophils Relative: 0 % (ref 0–1)
Eosinophils Absolute: 0.2 10*3/uL (ref 0.0–0.7)
Eosinophils Relative: 4 % (ref 0–5)
HCT: 43.7 % (ref 39.0–52.0)
Hemoglobin: 14.9 g/dL (ref 13.0–17.0)
Lymphocytes Relative: 40 % (ref 12–46)
Lymphs Abs: 2.2 10*3/uL (ref 0.7–4.0)
MCH: 30.1 pg (ref 26.0–34.0)
MCHC: 34.1 g/dL (ref 30.0–36.0)
MCV: 88.3 fL (ref 78.0–100.0)
Monocytes Absolute: 0.3 10*3/uL (ref 0.1–1.0)
Monocytes Relative: 6 % (ref 3–12)
Neutro Abs: 2.8 10*3/uL (ref 1.7–7.7)
Neutrophils Relative %: 50 % (ref 43–77)
Platelets: 215 10*3/uL (ref 150–400)
RBC: 4.95 MIL/uL (ref 4.22–5.81)
RDW: 13.9 % (ref 11.5–15.5)
WBC: 5.5 10*3/uL (ref 4.0–10.5)

## 2013-11-22 NOTE — Progress Notes (Signed)
Assessment and Plan:  Hypertension: Continue medication, monitor blood pressure at home. Continue DASH diet. Cholesterol: Continue diet and exercise. Check cholesterol.  Pre-diabetes-Continue diet and exercise. Check A1C Vitamin D Def- check level and continue medications.  Allergies- will add nasonex to zyrtec  Continue diet and meds as discussed. Further disposition pending results of labs.  HPI 53 y.o. male  presents for 3 month follow up with hypertension, hyperlipidemia, prediabetes and vitamin D. His blood pressure has been controlled at home, today their BP is BP: 110/78 mmHg He does workout, walks every other day. He denies chest pain, shortness of breath, dizziness.  He is on cholesterol medication and denies myalgias. His cholesterol is at goal. The cholesterol last visit was:   Lab Results  Component Value Date   CHOL 183 08/16/2013   HDL 57 08/16/2013   LDLCALC 108* 08/16/2013   TRIG 91 08/16/2013   CHOLHDL 3.2 08/16/2013    Last A1C in the office was:  Lab Results  Component Value Date   HGBA1C 5.2 08/16/2013   Patient is on Vitamin D supplement.   Lab Results  Component Value Date   VD25OH 67 08/16/2013     He is on thyroid medication. His medication was not changed last visit. Patient denies nervousness, palpitations and weight changes.  Lab Results  Component Value Date   TSH 4.406 08/16/2013  .  Recent doxycyline use for spider bite which has resolved.   Current Medications:  Current Outpatient Prescriptions on File Prior to Visit  Medication Sig Dispense Refill  . ALPRAZolam (XANAX) 1 MG tablet TAKE ONE-HALF TO ONE TABLET BY MOUTH THREE TIMES DAILY AS NEEDED  60 tablet  0  . atorvastatin (LIPITOR) 40 MG tablet TAKE ONE TABLET BY MOUTH EVERY DAY  30 tablet  0  . cetirizine (ZYRTEC) 10 MG tablet Take 10 mg by mouth as needed for allergies.      Marland Kitchen doxycycline (VIBRA-TABS) 100 MG tablet Take 1 tablet (100 mg total) by mouth 2 (two) times daily.  20 tablet  0  . enalapril  (VASOTEC) 20 MG tablet TAKE ONE TABLET BY MOUTH EVERY DAY  30 tablet  0  . levothyroxine (SYNTHROID, LEVOTHROID) 50 MCG tablet TAKE ONE TABLET BY MOUTH EVERY DAY  30 tablet  0  . mupirocin cream (BACTROBAN) 2 % Apply to affected area and each nostril twice daily x 5 days  30 g  0   No current facility-administered medications on file prior to visit.   Medical History:  Past Medical History  Diagnosis Date  . Hepatitis A     age 15  . Allergy   . Heart murmur   . Hypertension   . Hyperlipidemia   . Hypothyroidism    Allergies:  Allergies  Allergen Reactions  . Zoloft [Sertraline Hcl] Shortness Of Breath    jittery  . Lexapro [Escitalopram Oxalate]     Per pt: unknown     Review of Systems: [X]  = complains of  [ ]  = denies  General: Fatigue [ ]  Fever [ ]  Chills [ ]  Weakness [ ]   Insomnia [ ]  Eyes: Redness [ ]  Blurred vision [ ]  Diplopia [ ]   ENT: Congestion [ ]  Sinus Pain [ ]  Post Nasal Drip Valu.Nieves ] Sore Throat [ ]  Earache [ ]   Cardiac: Chest pain/pressure [ ]  SOB [ ]  Orthopnea [ ]   Palpitations [ ]   Paroxysmal nocturnal dyspnea[ ]  Claudication [ ]  Edema [ ]   Pulmonary: Cough [ ]  Wheezing[ ]   SOB [ ]   Snoring [ ]   GI: Nausea [ ]  Vomiting[ ]  Dysphagia[ ]  Heartburn[ ]  Abdominal pain [ ]  Constipation [ ] ; Diarrhea [ ] ; BRBPR [ ]  Melena[ ]  GU: Hematuria[ ]  Dysuria [ ]  Nocturia[ ]  Urgency [ ]   Hesitancy [ ]  Discharge [ ]  Neuro: Headaches[ ]  Vertigo[ ]  Paresthesias[ ]  Spasm [ ]  Speech changes [ ]  Incoordination [ ]   Ortho: Arthritis [ ]  Joint pain [ ]  Muscle pain [ ]  Joint swelling [ ]  Back Pain [ ]  Skin:  Rash [ ]   Pruritis [ ]  Change in skin lesion [ ]   Psych: Depression[ ]  Anxiety[ ]  Confusion [ ]  Memory loss [ ]   Heme/Lypmh: Bleeding [ ]  Bruising [ ]  Enlarged lymph nodes [ ]   Endocrine: Visual blurring [ ]  Paresthesia [ ]  Polyuria [ ]  Polydypsea [ ]    Heat/cold intolerance [ ]  Hypoglycemia [ ]   Family history- Review and unchanged Social history- Review and unchanged Physical  Exam: BP 110/78  Pulse 68  Temp(Src) 98.2 F (36.8 C)  Resp 16  Ht 5\' 9"  (1.753 m)  Wt 155 lb (70.308 kg)  BMI 22.88 kg/m2 Wt Readings from Last 3 Encounters:  11/22/13 155 lb (70.308 kg)  10/27/13 155 lb (70.308 kg)  10/21/13 157 lb (71.215 kg)   General Appearance: Well nourished, in no apparent distress. Eyes: PERRLA, EOMs, conjunctiva no swelling or erythema Sinuses: No Frontal/maxillary tenderness ENT/Mouth: Ext aud canals clear, TMs without erythema, bulging. No erythema, swelling, or exudate on post pharynx.  Tonsils not swollen or erythematous. Hearing normal.  Neck: Supple, thyroid normal.  Respiratory: Respiratory effort normal, BS equal bilaterally without rales, rhonchi, wheezing or stridor.  Cardio: RRR with no MRGs. Brisk peripheral pulses without edema.  Abdomen: Soft, + BS.  Non tender, no guarding, rebound, hernias, masses. Lymphatics: Non tender without lymphadenopathy.  Musculoskeletal: Full ROM, 5/5 strength, normal gait.  Skin: Warm, dry without rashes, lesions, ecchymosis.  Neuro: Cranial nerves intact. Normal muscle tone, no cerebellar symptoms. Sensation intact.  Psych: Awake and oriented X 3, normal affect, Insight and Judgment appropriate.    Vicie Mutters 12:19 PM

## 2013-11-22 NOTE — Patient Instructions (Addendum)
Allergies  Allergies may happen from anything your body is sensitive to. This may be food, medicines, pollens, chemicals, and many other things. Food allergies can be severe and deadly.  HOME CARE  If you do not know what causes a reaction, keep a diary. Write down the foods you ate and the symptoms that followed. Avoid foods that cause reactions.  If you have red raised spots (hives) or a rash:  Take medicine as told by your doctor.  Use medicines for red raised spots and itching as needed.  Apply cold cloths (compresses) to the skin. Take a cool bath. Avoid hot baths or showers.  If you are severely allergic:  It is often necessary to go to the hospital after you have treated your reaction.  Wear your medical alert jewelry.  You and your family must learn how to give a allergy shot or use an allergy kit (anaphylaxis kit).  Always carry your allergy kit or shot with you. Use this medicine as told by your doctor if a severe reaction is occurring. GET HELP RIGHT AWAY IF:  You have trouble breathing or are making high-pitched whistling sounds (wheezing).  You have a tight feeling in your chest or throat.  You have a puffy (swollen) mouth.  You have red raised spots, puffiness (swelling), or itching all over your body.  You have had a severe reaction that was helped by your allergy kit or shot. The reaction can return once the medicine has worn off.  You think you are having a food allergy. Symptoms most often happen within 30 minutes of eating a food.  Your symptoms have not gone away within 2 days or are getting worse.  You have new symptoms.  You want to retest yourself with a food or drink you think causes an allergic reaction. Only do this under the care of a doctor. MAKE SURE YOU:   Understand these instructions.  Will watch your condition.  Will get help right away if you are not doing well or get worse. Document Released: 07/27/2012 Document Reviewed:  07/27/2012 [facility] Patient Information 2015 [place]. This information is not intended to replace advice given to you by your health care provider. Make sure you discuss any questions you have with your health care provider.  Your LDL is the bad cholesterol that can lead to heart attack and stroke. To lower your number you can decrease your fatty foods, red meat, cheese, milk and increase fiber like whole grains and veggies. You can also add a fiber supplement like Metamucil or Benefiber.

## 2013-11-23 LAB — LIPID PANEL
Cholesterol: 129 mg/dL (ref 0–200)
HDL: 53 mg/dL (ref >39)
LDL Cholesterol: 60 mg/dL (ref 0–99)
Total CHOL/HDL Ratio: 2.4 Ratio
Triglycerides: 82 mg/dL (ref <150)
VLDL: 16 mg/dL (ref 0–40)

## 2013-11-23 LAB — BASIC METABOLIC PANEL WITH GFR
BUN: 15 mg/dL (ref 6–23)
CO2: 29 mEq/L (ref 19–32)
Calcium: 9.2 mg/dL (ref 8.4–10.5)
Chloride: 104 mEq/L (ref 96–112)
Creat: 0.99 mg/dL (ref 0.50–1.35)
GFR, Est African American: >89 mL/min
GFR, Est Non African American: 87 mL/min
Glucose, Bld: 77 mg/dL (ref 70–99)
Potassium: 4.1 mEq/L (ref 3.5–5.3)
Sodium: 140 mEq/L (ref 135–145)

## 2013-11-23 LAB — HEPATIC FUNCTION PANEL
ALT: 26 U/L (ref 0–53)
AST: 23 U/L (ref 0–37)
Albumin: 4.4 g/dL (ref 3.5–5.2)
Alkaline Phosphatase: 66 U/L (ref 39–117)
Bilirubin, Direct: 0.1 mg/dL (ref 0.0–0.3)
Indirect Bilirubin: 0.5 mg/dL (ref 0.2–1.2)
Total Bilirubin: 0.6 mg/dL (ref 0.2–1.2)
Total Protein: 6.6 g/dL (ref 6.0–8.3)

## 2013-11-23 LAB — TSH: TSH: 5.852 u[IU]/mL — ABNORMAL HIGH (ref 0.350–4.500)

## 2013-11-23 LAB — VITAMIN D 25 HYDROXY (VIT D DEFICIENCY, FRACTURES): Vit D, 25-Hydroxy: 51 ng/mL (ref 30–89)

## 2013-11-23 LAB — MAGNESIUM: Magnesium: 2 mg/dL (ref 1.5–2.5)

## 2014-02-24 ENCOUNTER — Ambulatory Visit (INDEPENDENT_AMBULATORY_CARE_PROVIDER_SITE_OTHER): Payer: BC Managed Care – PPO | Admitting: Internal Medicine

## 2014-02-24 ENCOUNTER — Encounter: Payer: Self-pay | Admitting: Internal Medicine

## 2014-02-24 VITALS — BP 136/82 | HR 72 | Temp 98.1°F | Resp 16 | Ht 69.5 in | Wt 161.8 lb

## 2014-02-24 DIAGNOSIS — R7989 Other specified abnormal findings of blood chemistry: Secondary | ICD-10-CM

## 2014-02-24 DIAGNOSIS — Z125 Encounter for screening for malignant neoplasm of prostate: Secondary | ICD-10-CM

## 2014-02-24 DIAGNOSIS — R6889 Other general symptoms and signs: Secondary | ICD-10-CM

## 2014-02-24 DIAGNOSIS — E782 Mixed hyperlipidemia: Secondary | ICD-10-CM

## 2014-02-24 DIAGNOSIS — Z113 Encounter for screening for infections with a predominantly sexual mode of transmission: Secondary | ICD-10-CM

## 2014-02-24 DIAGNOSIS — I10 Essential (primary) hypertension: Secondary | ICD-10-CM

## 2014-02-24 DIAGNOSIS — R945 Abnormal results of liver function studies: Secondary | ICD-10-CM

## 2014-02-24 DIAGNOSIS — E559 Vitamin D deficiency, unspecified: Secondary | ICD-10-CM

## 2014-02-24 DIAGNOSIS — E039 Hypothyroidism, unspecified: Secondary | ICD-10-CM

## 2014-02-24 DIAGNOSIS — Z111 Encounter for screening for respiratory tuberculosis: Secondary | ICD-10-CM

## 2014-02-24 DIAGNOSIS — Z79899 Other long term (current) drug therapy: Secondary | ICD-10-CM

## 2014-02-24 DIAGNOSIS — Z1212 Encounter for screening for malignant neoplasm of rectum: Secondary | ICD-10-CM

## 2014-02-24 DIAGNOSIS — R7309 Other abnormal glucose: Secondary | ICD-10-CM

## 2014-02-24 DIAGNOSIS — Z0001 Encounter for general adult medical examination with abnormal findings: Secondary | ICD-10-CM

## 2014-02-24 LAB — CBC WITH DIFFERENTIAL/PLATELET
Basophils Absolute: 0 10*3/uL (ref 0.0–0.1)
Basophils Relative: 0 % (ref 0–1)
Eosinophils Absolute: 0.2 10*3/uL (ref 0.0–0.7)
Eosinophils Relative: 4 % (ref 0–5)
HCT: 43.4 % (ref 39.0–52.0)
Hemoglobin: 14.7 g/dL (ref 13.0–17.0)
Lymphocytes Relative: 34 % (ref 12–46)
Lymphs Abs: 1.9 10*3/uL (ref 0.7–4.0)
MCH: 30.2 pg (ref 26.0–34.0)
MCHC: 33.9 g/dL (ref 30.0–36.0)
MCV: 89.3 fL (ref 78.0–100.0)
Monocytes Absolute: 0.3 10*3/uL (ref 0.1–1.0)
Monocytes Relative: 6 % (ref 3–12)
Neutro Abs: 3.1 10*3/uL (ref 1.7–7.7)
Neutrophils Relative %: 56 % (ref 43–77)
Platelets: 202 10*3/uL (ref 150–400)
RBC: 4.86 MIL/uL (ref 4.22–5.81)
RDW: 14 % (ref 11.5–15.5)
WBC: 5.5 10*3/uL (ref 4.0–10.5)

## 2014-02-24 NOTE — Progress Notes (Signed)
Patient ID: Randy Allen, male   DOB: Jan 15, 1961, 53 y.o.   MRN: [id number]  Annual Screening Comprehensive Examination  This very nice 53 y.o.MWM presents for complete physical.  Patient has been followed for HTN,  Prediabetes, Hyperlipidemia, and Vitamin D Deficiency. Patient has been on thyroid replacement 06/2009.    HTN predates since 1999. Patient's BP has been controlled and today's BP: 136/82 mmHg.  Patient does have hx/o bicuspid AoV. Patient denies any cardiac symptoms as chest pain, palpitations, shortness of breath, dizziness or ankle swelling.   Patient's hyperlipidemia is controlled with diet and medications. Patient denies myalgias or other medication SE's. Last lipids were at goal - Total Chol 129; HDL 53; LDL 60; Trig 82 on  11/22/2013.   Patient is screened for  prediabetes and patient denies reactive hypoglycemic symptoms, visual blurring, diabetic polys or paresthesias. Last A1c was 5.2% on  08/16/2013.    Finally, patient has history of Vitamin D Deficiency of 12 in 2008 and last vitamin D was  51 on  11/22/2013.  Medication Sig  . ALPRAZolam (XANAX) 1 MG tablet TAKE ONE-HALF TO ONE TABLET BY MOUTH THREE TIMES DAILY AS NEEDED  . atorvastatin (LIPITOR) 40 MG tablet TAKE ONE TABLET BY MOUTH EVERY DAY  . cetirizine (ZYRTEC) 10 MG tablet Take 10 mg by mouth as needed for allergies.  Marland Kitchen doxycycline (VIBRA-TABS) 100 MG tablet Take 1 tablet (100 mg total) by mouth 2 (two) times daily.  . enalapril (VASOTEC) 20 MG tablet TAKE ONE TABLET BY MOUTH EVERY DAY  . levothyroxine (SYNTHROID, LEVOTHROID) 50 MCG tablet TAKE ONE TABLET BY MOUTH EVERY DAY  . mupirocin cream (BACTROBAN) 2 % Apply to affected area and each nostril twice daily x 5 days   Allergies  Allergen Reactions  . Zoloft [Sertraline Hcl] Shortness Of Breath    jittery  . Lexapro [Escitalopram Oxalate]     Per pt: unknown   Past Medical History  Diagnosis Date  . Hepatitis A     age 41  . Allergy   . Heart murmur   .  Hypertension   . Hyperlipidemia   . Hypothyroidism    Health Maintenance  Topic Date Due  . INFLUENZA VACCINE  11/13/2013  . COLONOSCOPY  10/27/2023  . TETANUS/TDAP  10/28/2023   Immunization History  Administered Date(s) Administered  . PPD Test 02/22/2013, 02/24/2014  . Td 04/15/2005  . Tdap 10/27/2013   Past Surgical History  Procedure Laterality Date  . Eye surgery Right 1966    lazy eye  . Wrist surgery Right 1996  . Hydrocele excision     Family History  Problem Relation Age of Onset  . Arthritis Mother   . Depression Mother   . Migraines Mother   . Cancer Father     prostate  . Arthritis Father   . Ulcers Father   . Heart attack Father   . Colon cancer Maternal Uncle 62   History   Social History  . Marital Status: Married    Spouse Name: N/A    Number of Children: N/A  . Years of Education: N/A   Occupational History  . Not on file.   Social History Main Topics  . Smoking status: Never Smoker   . Smokeless tobacco: Never Used  . Alcohol Use: 1.0 - 1.5 oz/week    2-3 drink(s) per week  . Drug Use: No  . Sexual Activity: Not on file    ROS Constitutional: Denies fever, chills, weight loss/gain, headaches,  insomnia, fatigue, night sweats or change in appetite. Eyes: Denies redness, blurred vision, diplopia, discharge, itchy or watery eyes.  ENT: Denies discharge, congestion, post nasal drip, epistaxis, sore throat, earache, hearing loss, dental pain, Tinnitus, Vertigo, Sinus pain or snoring.  Cardio: Denies chest pain, palpitations, irregular heartbeat, syncope, dyspnea, diaphoresis, orthopnea, PND, claudication or edema Respiratory: denies cough, dyspnea, DOE, pleurisy, hoarseness, laryngitis or wheezing.  Gastrointestinal: Denies dysphagia, heartburn, reflux, water brash, pain, cramps, nausea, vomiting, bloating, diarrhea, constipation, hematemesis, melena, hematochezia, jaundice or hemorrhoids Genitourinary: Denies dysuria, frequency, urgency,  nocturia, hesitancy, discharge, hematuria or flank pain Musculoskeletal: Denies arthralgia, myalgia, stiffness, Jt. Swelling, pain, limp or strain/sprain. Denies Falls. Skin: Denies puritis, rash, hives, warts, acne, eczema or change in skin lesion Neuro: No weakness, tremor, incoordination, spasms, paresthesia or pain Psychiatric: Denies confusion, memory loss or sensory loss. Denies Depression. Endocrine: Denies change in weight, skin, hair change, nocturia, and paresthesia, diabetic polys, visual blurring or hyper / hypo glycemic episodes.  Heme/Lymph: No excessive bleeding, bruising or enlarged lymph nodes.  Physical Exam  BP 136/82  Pulse 72  Temp 98.1 F   Resp 16  Ht 5' 9.5"   Wt 161 lb 12.8 oz   BMI 23.56 kg/m2  General Appearance: Well nourished, in no apparent distress. Eyes: PERRLA, EOMs, conjunctiva no swelling or erythema, normal fundi and vessels. Sinuses: No frontal/maxillary tenderness ENT/Mouth: EACs patent / TMs  nl. Nares clear without erythema, swelling, mucoid exudates. Oral hygiene is good. No erythema, swelling, or exudate. Tongue normal, non-obstructing. Tonsils not swollen or erythematous. Hearing normal.  Neck: Supple, thyroid normal. No bruits, nodes or JVD. Respiratory: Respiratory effort normal.  BS equal and clear bilateral without rales, rhonci, wheezing or stridor. Cardio: Heart sounds are normal with regular rate and rhythm and no murmurs, rubs or gallops. Peripheral pulses are normal and equal bilaterally without edema. No aortic or femoral bruits. Chest: symmetric with normal excursions and percussion.  Abdomen: Flat, soft, with bowl sounds. Nontender, no guarding, rebound, hernias, masses, or organomegaly.  Lymphatics: Non tender without lymphadenopathy.  Genitourinary: No hernias.Testes nl. Colonoscopy in July was negative & patient deferred DRE. Musculoskeletal: Full ROM all peripheral extremities, joint stability, 5/5 strength, and normal  gait. Skin: Warm and dry without rashes, lesions, cyanosis, clubbing or  ecchymosis.  Neuro: Cranial nerves intact, reflexes equal bilaterally. Normal muscle tone, no cerebellar symptoms. Sensation intact.  Pysch: Awake and oriented X 3with normal affect, insight and judgment appropriate.   Assessment and Plan  1. Annual Screening Examination 2. Hypertension  3. Hyperlipidemia 4. Pre Diabetes 5. Vitamin D Deficiency 6. Hypothyroidism   Continue prudent diet as discussed, weight control, BP monitoring, regular exercise, and medications as discussed.  Discussed med effects and SE's. Routine screening labs and tests as requested with regular follow-up as recommended.

## 2014-02-24 NOTE — Patient Instructions (Signed)

## 2014-02-25 LAB — LIPID PANEL
Cholesterol: 121 mg/dL (ref 0–200)
HDL: 56 mg/dL (ref >39)
LDL Cholesterol: 52 mg/dL (ref 0–99)
Total CHOL/HDL Ratio: 2.2 Ratio
Triglycerides: 65 mg/dL (ref <150)
VLDL: 13 mg/dL (ref 0–40)

## 2014-02-25 LAB — BASIC METABOLIC PANEL WITH GFR
BUN: 12 mg/dL (ref 6–23)
CO2: 32 mEq/L (ref 19–32)
Calcium: 9.3 mg/dL (ref 8.4–10.5)
Chloride: 105 mEq/L (ref 96–112)
Creat: 0.8 mg/dL (ref 0.50–1.35)
GFR, Est African American: >89 mL/min
GFR, Est Non African American: >89 mL/min
Glucose, Bld: 91 mg/dL (ref 70–99)
Potassium: 4.3 mEq/L (ref 3.5–5.3)
Sodium: 139 mEq/L (ref 135–145)

## 2014-02-25 LAB — MAGNESIUM: Magnesium: 2 mg/dL (ref 1.5–2.5)

## 2014-02-25 LAB — INSULIN, FASTING: Insulin fasting, serum: 4.5 u[IU]/mL (ref 2.0–19.6)

## 2014-02-25 LAB — URINALYSIS, MICROSCOPIC ONLY
Bacteria, UA: NONE SEEN
Casts: NONE SEEN
Crystals: NONE SEEN
Squamous Epithelial / LPF: NONE SEEN

## 2014-02-25 LAB — MICROALBUMIN / CREATININE URINE RATIO
Creatinine, Urine: 74.6 mg/dL
Microalb Creat Ratio: 2.7 mg/g (ref 0.0–30.0)
Microalb, Ur: 0.2 mg/dL (ref <2.0)

## 2014-02-25 LAB — TSH: TSH: 2.146 u[IU]/mL (ref 0.350–4.500)

## 2014-02-25 LAB — HEPATIC FUNCTION PANEL
ALT: 39 U/L (ref 0–53)
AST: 28 U/L (ref 0–37)
Albumin: 4.3 g/dL (ref 3.5–5.2)
Alkaline Phosphatase: 68 U/L (ref 39–117)
Bilirubin, Direct: 0.2 mg/dL (ref 0.0–0.3)
Indirect Bilirubin: 0.5 mg/dL (ref 0.2–1.2)
Total Bilirubin: 0.7 mg/dL (ref 0.2–1.2)
Total Protein: 6.7 g/dL (ref 6.0–8.3)

## 2014-02-25 LAB — HEMOGLOBIN A1C
Hgb A1c MFr Bld: 5.6 % (ref <5.7)
Mean Plasma Glucose: 114 mg/dL (ref <117)

## 2014-02-25 LAB — PSA: PSA: 0.47 ng/mL (ref <=4.00)

## 2014-02-25 LAB — VITAMIN B12: Vitamin B-12: 437 pg/mL (ref 211–911)

## 2014-02-25 LAB — VITAMIN D 25 HYDROXY (VIT D DEFICIENCY, FRACTURES): Vit D, 25-Hydroxy: 59 ng/mL (ref 30–89)

## 2014-02-25 LAB — TESTOSTERONE: Testosterone: 371 ng/dL (ref 300–890)

## 2014-02-28 LAB — TB SKIN TEST
Induration: 0 mm
TB Skin Test: NEGATIVE

## 2014-05-02 ENCOUNTER — Other Ambulatory Visit: Payer: Self-pay | Admitting: Internal Medicine

## 2014-05-02 DIAGNOSIS — F411 Generalized anxiety disorder: Secondary | ICD-10-CM

## 2014-05-02 DIAGNOSIS — E782 Mixed hyperlipidemia: Secondary | ICD-10-CM

## 2014-05-02 DIAGNOSIS — I10 Essential (primary) hypertension: Secondary | ICD-10-CM

## 2014-05-02 MED ORDER — ENALAPRIL MALEATE 20 MG PO TABS: 20.0000 mg | ORAL_TABLET | Freq: Every day | ORAL | Status: DC

## 2014-05-02 MED ORDER — ATORVASTATIN CALCIUM 40 MG PO TABS: 40.0000 mg | ORAL_TABLET | Freq: Every day | ORAL | Status: DC

## 2014-05-02 MED ORDER — ALPRAZOLAM 1 MG PO TABS: ORAL_TABLET | ORAL | Status: AC

## 2014-06-02 ENCOUNTER — Ambulatory Visit (INDEPENDENT_AMBULATORY_CARE_PROVIDER_SITE_OTHER): Payer: BLUE CROSS/BLUE SHIELD | Admitting: Physician Assistant

## 2014-06-02 ENCOUNTER — Encounter: Payer: Self-pay | Admitting: Physician Assistant

## 2014-06-02 VITALS — BP 122/78 | HR 68 | Temp 98.1°F | Resp 16 | Ht 69.5 in | Wt 163.0 lb

## 2014-06-02 DIAGNOSIS — E039 Hypothyroidism, unspecified: Secondary | ICD-10-CM

## 2014-06-02 DIAGNOSIS — Z8774 Personal history of (corrected) congenital malformations of heart and circulatory system: Secondary | ICD-10-CM

## 2014-06-02 DIAGNOSIS — I10 Essential (primary) hypertension: Secondary | ICD-10-CM

## 2014-06-02 DIAGNOSIS — E559 Vitamin D deficiency, unspecified: Secondary | ICD-10-CM

## 2014-06-02 DIAGNOSIS — Z79899 Other long term (current) drug therapy: Secondary | ICD-10-CM

## 2014-06-02 DIAGNOSIS — R7309 Other abnormal glucose: Secondary | ICD-10-CM

## 2014-06-02 DIAGNOSIS — E782 Mixed hyperlipidemia: Secondary | ICD-10-CM

## 2014-06-02 NOTE — Progress Notes (Signed)
Assessment and Plan:  Hypertension: Continue medication, monitor blood pressure at home. Continue DASH diet.  Reminder to go to the ER if any CP, SOB, nausea, dizziness, severe HA, changes vision/speech, left arm numbness and tingling, and jaw pain. Cholesterol: Continue diet and exercise. Check cholesterol.  Hypothyroidism-check TSH level, continue medications the same, reminded to take on an empty stomach 30-65mins before food.  Vitamin D Def- check level and continue medications.  Bicuspid valve/AS- no CV sx's at this time, monitor  Continue diet and meds as discussed. Further disposition pending results of labs.  HPI 54 y.o. male  presents for 3 month follow up with hypertension, hyperlipidemia, prediabetes and vitamin D.  His blood pressure has been controlled at home, today their BP is BP: 122/78 mmHg  He does not workout. He denies chest pain, shortness of breath, dizziness.  He is on cholesterol medication, lipitor 40 1/2 QD and denies myalgias. His cholesterol is at goal. The cholesterol last visit was:   Lab Results  Component Value Date   CHOL 121 02/24/2014   HDL 56 02/24/2014   LDLCALC 52 02/24/2014   TRIG 65 02/24/2014   CHOLHDL 2.2 02/24/2014  Last A1C in the office was:  Lab Results  Component Value Date   HGBA1C 5.6 02/24/2014  Patient is on Vitamin D supplement.   Lab Results  Component Value Date   VD25OH 49 02/24/2014   He  is on thyroid medication. His medication was not changed last visit.   Lab Results  Component Value Date   TSH 2.146 02/24/2014  .    Current Medications:  Current Outpatient Prescriptions on File Prior to Visit  Medication Sig Dispense Refill  . ALPRAZolam (XANAX) 1 MG tablet TAKE ONE-HALF TO ONE TABLET BY MOUTH THREE TIMES DAILY AS NEEDED 90 tablet 0  . atorvastatin (LIPITOR) 40 MG tablet Take 1 tablet (40 mg total) by mouth daily. 30 tablet 0  . cetirizine (ZYRTEC) 10 MG tablet Take 10 mg by mouth as needed for allergies.    Marland Kitchen  enalapril (VASOTEC) 20 MG tablet Take 1 tablet (20 mg total) by mouth daily. 30 tablet 0  . levothyroxine (SYNTHROID, LEVOTHROID) 50 MCG tablet TAKE ONE TABLET BY MOUTH EVERY DAY 30 tablet 0   No current facility-administered medications on file prior to visit.   Medical History:  Past Medical History  Diagnosis Date  . Hepatitis A     age 54  . Allergy   . Heart murmur   . Hypertension   . Hyperlipidemia   . Hypothyroidism    Allergies:  Allergies  Allergen Reactions  . Zoloft [Sertraline Hcl] Shortness Of Breath    jittery  . Lexapro [Escitalopram Oxalate]     Per pt: unknown     Review of Systems:  Review of Systems  Constitutional: Negative.   HENT: Negative.   Eyes: Negative.   Respiratory: Negative.   Cardiovascular: Negative.   Gastrointestinal: Negative.   Genitourinary: Negative.   Musculoskeletal: Negative.   Skin: Negative.   Neurological: Negative.   Endo/Heme/Allergies: Negative.   Psychiatric/Behavioral: Negative.     Family history- Review and unchanged Social history- Review and unchanged Physical Exam: BP 122/78 mmHg  Pulse 68  Temp(Src) 98.1 F (36.7 C)  Resp 16  Ht 5' 9.5" (1.765 m)  Wt 163 lb (73.936 kg)  BMI 23.73 kg/m2 Wt Readings from Last 3 Encounters:  06/02/14 163 lb (73.936 kg)  02/24/14 161 lb 12.8 oz (73.392 kg)  11/22/13 155 lb (  70.308 kg)   General Appearance: Well nourished, in no apparent distress. Eyes: PERRLA, EOMs, conjunctiva no swelling or erythema Sinuses: No Frontal/maxillary tenderness ENT/Mouth: Ext aud canals clear, TMs without erythema, bulging. No erythema, swelling, or exudate on post pharynx.  Tonsils not swollen or erythematous. Hearing normal.  Neck: Supple, thyroid normal.  Respiratory: Respiratory effort normal, BS equal bilaterally without rales, rhonchi, wheezing or stridor.  Cardio: RRR with 4/6 systolic murmur. Brisk peripheral pulses without edema.  Abdomen: Soft, + BS,  Non tender, no guarding,  rebound, hernias, masses. Lymphatics: Non tender without lymphadenopathy.  Musculoskeletal: Full ROM, 5/5 strength, Normal gait Skin: Warm, dry without rashes, lesions, ecchymosis.  Neuro: Cranial nerves intact. Normal muscle tone, no cerebellar symptoms. Psych: Awake and oriented X 3, normal affect, Insight and Judgment appropriate.    Vicie Mutters, PA-C 11:52 AM [facility]

## 2014-06-02 NOTE — Patient Instructions (Signed)
Benefiber is good for constipation/diarrhea/irritable bowel syndrome, it helps with weight loss and can help lower your bad cholesterol. Please do 1-2 TBSP in the morning in water, coffee, or tea. It can take up to a month before you can see a difference with your bowel movements. It is cheapest from [employer], [employer], [employer].      Bad carbs also include fruit juice, alcohol, and sweet tea. These are empty calories that do not signal to your brain that you are full.   Please remember the good carbs are still carbs which convert into sugar. So please measure them out no more than 1/2-1 cup of rice, oatmeal, pasta, and beans  Veggies are however free foods! Pile them on.   Not all fruit is created equal. Please see the list below, the fruit at the bottom is higher in sugars than the fruit at the top. Please avoid all dried fruits.

## 2014-06-03 LAB — CBC WITH DIFFERENTIAL/PLATELET
Basophils Absolute: 0 10*3/uL (ref 0.0–0.1)
Basophils Relative: 0 % (ref 0–1)
Eosinophils Absolute: 0.2 10*3/uL (ref 0.0–0.7)
Eosinophils Relative: 4 % (ref 0–5)
HCT: 44 % (ref 39.0–52.0)
Hemoglobin: 15.1 g/dL (ref 13.0–17.0)
Lymphocytes Relative: 30 % (ref 12–46)
Lymphs Abs: 1.8 10*3/uL (ref 0.7–4.0)
MCH: 30.5 pg (ref 26.0–34.0)
MCHC: 34.3 g/dL (ref 30.0–36.0)
MCV: 88.9 fL (ref 78.0–100.0)
MPV: 9 fL (ref 8.6–12.4)
Monocytes Absolute: 0.4 10*3/uL (ref 0.1–1.0)
Monocytes Relative: 6 % (ref 3–12)
Neutro Abs: 3.5 10*3/uL (ref 1.7–7.7)
Neutrophils Relative %: 60 % (ref 43–77)
Platelets: 223 10*3/uL (ref 150–400)
RBC: 4.95 MIL/uL (ref 4.22–5.81)
RDW: 14 % (ref 11.5–15.5)
WBC: 5.9 10*3/uL (ref 4.0–10.5)

## 2014-06-03 LAB — HEMOGLOBIN A1C
Hgb A1c MFr Bld: 5.4 % (ref <5.7)
Mean Plasma Glucose: 108 mg/dL (ref <117)

## 2014-06-03 LAB — BASIC METABOLIC PANEL WITH GFR
BUN: 12 mg/dL (ref 6–23)
CO2: 27 mEq/L (ref 19–32)
Calcium: 9.4 mg/dL (ref 8.4–10.5)
Chloride: 102 mEq/L (ref 96–112)
Creat: 0.98 mg/dL (ref 0.50–1.35)
GFR, Est African American: >89 mL/min
GFR, Est Non African American: 88 mL/min
Glucose, Bld: 79 mg/dL (ref 70–99)
Potassium: 4.1 mEq/L (ref 3.5–5.3)
Sodium: 141 mEq/L (ref 135–145)

## 2014-06-03 LAB — HEPATIC FUNCTION PANEL
ALT: 51 U/L (ref 0–53)
AST: 34 U/L (ref 0–37)
Albumin: 4.3 g/dL (ref 3.5–5.2)
Alkaline Phosphatase: 64 U/L (ref 39–117)
Bilirubin, Direct: 0.1 mg/dL (ref 0.0–0.3)
Indirect Bilirubin: 0.5 mg/dL (ref 0.2–1.2)
Total Bilirubin: 0.6 mg/dL (ref 0.2–1.2)
Total Protein: 6.8 g/dL (ref 6.0–8.3)

## 2014-06-03 LAB — VITAMIN D 25 HYDROXY (VIT D DEFICIENCY, FRACTURES): Vit D, 25-Hydroxy: 48 ng/mL (ref 30–100)

## 2014-06-03 LAB — LIPID PANEL
Cholesterol: 130 mg/dL (ref 0–200)
HDL: 50 mg/dL (ref >39)
LDL Cholesterol: 62 mg/dL (ref 0–99)
Total CHOL/HDL Ratio: 2.6 Ratio
Triglycerides: 88 mg/dL (ref <150)
VLDL: 18 mg/dL (ref 0–40)

## 2014-06-03 LAB — TSH: TSH: 3.458 u[IU]/mL (ref 0.350–4.500)

## 2014-06-03 LAB — MAGNESIUM: Magnesium: 1.9 mg/dL (ref 1.5–2.5)

## 2014-08-23 ENCOUNTER — Encounter: Payer: Self-pay | Admitting: Internal Medicine

## 2014-08-23 ENCOUNTER — Ambulatory Visit (INDEPENDENT_AMBULATORY_CARE_PROVIDER_SITE_OTHER): Payer: BLUE CROSS/BLUE SHIELD | Admitting: Internal Medicine

## 2014-08-23 VITALS — BP 138/92 | HR 60 | Temp 98.4°F | Resp 16 | Ht 69.5 in | Wt 160.0 lb

## 2014-08-23 DIAGNOSIS — J029 Acute pharyngitis, unspecified: Secondary | ICD-10-CM

## 2014-08-23 LAB — POCT RAPID STREP A (OFFICE): Rapid Strep A Screen: NEGATIVE

## 2014-08-23 MED ORDER — PREDNISONE 20 MG PO TABS: ORAL_TABLET | ORAL | Status: DC

## 2014-08-23 MED ORDER — LIDOCAINE VISCOUS 2 % MT SOLN: 20.0000 mL | OROMUCOSAL | Status: DC | PRN

## 2014-08-23 MED ORDER — FLUTICASONE PROPIONATE 50 MCG/ACT NA SUSP: 2.0000 | Freq: Every day | NASAL | Status: DC

## 2014-08-23 MED ORDER — AZITHROMYCIN 250 MG PO TABS: ORAL_TABLET | ORAL | Status: AC

## 2014-08-23 NOTE — Progress Notes (Signed)
   Subjective:    Patient ID: Randy Allen, male    DOB: [birth year], 54 y.o.   MRN: [id number]  Sore Throat  This is a new problem. The current episode started in the past 7 days. The problem has been unchanged. The pain is worse on the right side. Maximum temperature: tactile fever. The pain is at a severity of 5/10. The pain is moderate. Associated symptoms include congestion, coughing, ear pain, headaches, a plugged ear sensation, swollen glands and trouble swallowing (painful to swallow but able to tolerate eating and drinking). Pertinent negatives include no abdominal pain, ear discharge, hoarse voice, neck pain, shortness of breath, stridor or vomiting. He has had no exposure to strep or mono. Treatments tried: allergy medicine. The treatment provided no relief.      Review of Systems  Constitutional: Negative for fever, chills and fatigue.  HENT: Positive for congestion, ear pain and trouble swallowing (painful to swallow but able to tolerate eating and drinking). Negative for ear discharge and hoarse voice.   Respiratory: Positive for cough. Negative for shortness of breath and stridor.   Cardiovascular: Negative for chest pain, palpitations and leg swelling.  Gastrointestinal: Negative for nausea, vomiting and abdominal pain.  Musculoskeletal: Negative for neck pain.  Neurological: Positive for headaches.  All other systems reviewed and are negative.      Objective:   Physical Exam  Constitutional: He is oriented to person, place, and time. He appears well-developed and well-nourished. No distress.  HENT:  Head: Normocephalic and atraumatic.  Mouth/Throat: Oropharynx is clear and moist. No oropharyngeal exudate.  Eyes: Conjunctivae and EOM are normal. Pupils are equal, round, and reactive to light. No scleral icterus.  Neck: Normal range of motion. Neck supple. No JVD present. No thyromegaly present.  Cardiovascular: Normal rate, regular rhythm, normal heart sounds and intact  distal pulses.  Exam reveals no gallop and no friction rub.   No murmur heard. Pulmonary/Chest: Effort normal and breath sounds normal. No respiratory distress. He has no wheezes. He has no rales. He exhibits no tenderness.  Musculoskeletal: Normal range of motion.  Lymphadenopathy:    He has no cervical adenopathy.  Neurological: He is alert and oriented to person, place, and time.  Skin: Skin is warm and dry. He is not diaphoretic.  Psychiatric: He has a normal mood and affect. His behavior is normal. Judgment and thought content normal.  Nursing note and vitals reviewed.   Filed Vitals:   08/23/14 0853  BP: 138/92  Pulse: 60  Temp: 98.4 F (36.9 C)  Resp: 16          Assessment & Plan:    1. Acute pharyngitis, unspecified pharyngitis type  -rapid strep negative -cont allergy meds -nasal saline -salt water gargles -return for trismus or difficulty with swallowing. - predniSONE (DELTASONE) 20 MG tablet; 3 tabs po day one, then 2 tabs daily x 4 days  Dispense: 11 tablet; Refill: 0 - lidocaine (XYLOCAINE) 2 % solution; Use as directed 20 mLs in the mouth or throat as needed for mouth pain.  Dispense: 100 mL; Refill: 0 - fluticasone (FLONASE) 50 MCG/ACT nasal spray; Place 2 sprays into both nostrils daily.  Dispense: 16 g; Refill: 0 - azithromycin (ZITHROMAX Z-PAK) 250 MG tablet; Take 2 tablets (500 mg) on  Day 1,  followed by 1 tablet (250 mg) once daily on Days 2 through 5.  Dispense: 6 each; Refill: 0

## 2014-08-23 NOTE — Addendum Note (Signed)
Addended by: Gregary Blackard A on: 08/23/2014 09:29 AM   Modules accepted: Orders

## 2014-08-23 NOTE — Patient Instructions (Signed)

## 2014-09-01 ENCOUNTER — Ambulatory Visit (INDEPENDENT_AMBULATORY_CARE_PROVIDER_SITE_OTHER): Payer: BLUE CROSS/BLUE SHIELD | Admitting: Internal Medicine

## 2014-09-01 ENCOUNTER — Encounter: Payer: Self-pay | Admitting: Internal Medicine

## 2014-09-01 VITALS — BP 116/84 | HR 72 | Temp 97.0°F | Resp 16 | Ht 69.5 in | Wt 159.2 lb

## 2014-09-01 DIAGNOSIS — R7309 Other abnormal glucose: Secondary | ICD-10-CM

## 2014-09-01 DIAGNOSIS — Z79899 Other long term (current) drug therapy: Secondary | ICD-10-CM

## 2014-09-01 DIAGNOSIS — E782 Mixed hyperlipidemia: Secondary | ICD-10-CM

## 2014-09-01 DIAGNOSIS — L989 Disorder of the skin and subcutaneous tissue, unspecified: Secondary | ICD-10-CM

## 2014-09-01 DIAGNOSIS — I10 Essential (primary) hypertension: Secondary | ICD-10-CM

## 2014-09-01 DIAGNOSIS — E559 Vitamin D deficiency, unspecified: Secondary | ICD-10-CM

## 2014-09-01 DIAGNOSIS — E039 Hypothyroidism, unspecified: Secondary | ICD-10-CM

## 2014-09-01 LAB — BASIC METABOLIC PANEL WITH GFR
BUN: 12 mg/dL (ref 6–23)
CO2: 28 mEq/L (ref 19–32)
Calcium: 9.8 mg/dL (ref 8.4–10.5)
Chloride: 102 mEq/L (ref 96–112)
Creat: 1.01 mg/dL (ref 0.50–1.35)
GFR, Est African American: >89 mL/min
GFR, Est Non African American: 85 mL/min
Glucose, Bld: 88 mg/dL (ref 70–99)
Potassium: 4.4 mEq/L (ref 3.5–5.3)
Sodium: 139 mEq/L (ref 135–145)

## 2014-09-01 LAB — CBC WITH DIFFERENTIAL/PLATELET
Basophils Absolute: 0 10*3/uL (ref 0.0–0.1)
Basophils Relative: 0 % (ref 0–1)
Eosinophils Absolute: 0.2 10*3/uL (ref 0.0–0.7)
Eosinophils Relative: 3 % (ref 0–5)
HCT: 43.4 % (ref 39.0–52.0)
Hemoglobin: 14.9 g/dL (ref 13.0–17.0)
Lymphocytes Relative: 28 % (ref 12–46)
Lymphs Abs: 1.7 10*3/uL (ref 0.7–4.0)
MCH: 30.3 pg (ref 26.0–34.0)
MCHC: 34.3 g/dL (ref 30.0–36.0)
MCV: 88.2 fL (ref 78.0–100.0)
MPV: 8.8 fL (ref 8.6–12.4)
Monocytes Absolute: 0.4 10*3/uL (ref 0.1–1.0)
Monocytes Relative: 7 % (ref 3–12)
Neutro Abs: 3.8 10*3/uL (ref 1.7–7.7)
Neutrophils Relative %: 62 % (ref 43–77)
Platelets: 281 10*3/uL (ref 150–400)
RBC: 4.92 MIL/uL (ref 4.22–5.81)
RDW: 13.8 % (ref 11.5–15.5)
WBC: 6.2 10*3/uL (ref 4.0–10.5)

## 2014-09-01 LAB — HEPATIC FUNCTION PANEL
ALT: 27 U/L (ref 0–53)
AST: 24 U/L (ref 0–37)
Albumin: 4.4 g/dL (ref 3.5–5.2)
Alkaline Phosphatase: 74 U/L (ref 39–117)
Bilirubin, Direct: 0.2 mg/dL (ref 0.0–0.3)
Indirect Bilirubin: 0.4 mg/dL (ref 0.2–1.2)
Total Bilirubin: 0.6 mg/dL (ref 0.2–1.2)
Total Protein: 7.5 g/dL (ref 6.0–8.3)

## 2014-09-01 LAB — LIPID PANEL
Cholesterol: 110 mg/dL (ref 0–200)
HDL: 48 mg/dL (ref >=40)
LDL Cholesterol: 49 mg/dL (ref 0–99)
Total CHOL/HDL Ratio: 2.3 Ratio
Triglycerides: 65 mg/dL (ref <150)
VLDL: 13 mg/dL (ref 0–40)

## 2014-09-01 LAB — TSH: TSH: 3.051 u[IU]/mL (ref 0.350–4.500)

## 2014-09-01 LAB — MAGNESIUM: Magnesium: 2.1 mg/dL (ref 1.5–2.5)

## 2014-09-01 LAB — HEMOGLOBIN A1C
Hgb A1c MFr Bld: 5.5 % (ref <5.7)
Mean Plasma Glucose: 111 mg/dL (ref <117)

## 2014-09-01 NOTE — Progress Notes (Signed)
Patient ID: Randy Allen, male   DOB: 09-26-1960, 54 y.o.   MRN: [id number]   This very nice 54 y.o. MWM presents for 3 month follow up with Hypertension, Hyperlipidemia, Pre-Diabetes and Vitamin D Deficiency.    Patient is treated for HTN & BP has been controlled at home. Today's BP: 116/84 mmHg. Patient has had no complaints of any cardiac type chest pain, palpitations, dyspnea/orthopnea/PND, dizziness, claudication, or dependent edema.   Hyperlipidemia is controlled with diet & meds. Patient denies myalgias or other med SE's. Last Lipids were Total  Chol 130; HDL 50; LDL 62; Trig 88 on 06/02/2014. He reports no excessive muscle aches or myalgias.  He does feel that he does have some more fatigue.   Also, the patient has history of T2_NIDDM PreDiabetes and has had no symptoms of reactive hypoglycemia, diabetic polys, paresthesias or visual blurring.  Last A1c was  5.4% on 06/02/2014.    Further, the patient also has history of Vitamin D Deficiency and supplements vitamin D without any suspected side-effects. Last vitamin D was  48 on 06/02/2014.  Medication Sig  . atorvastatin  40 MG tablet Take 1 tablet (40 mg total) by mouth daily.  Marland Kitchen VITAMIN B COMPLEX  Take by mouth daily.  . cetirizine  10 MG tablet Take 10 mg by mouth as needed for allergies.  Marland Kitchen VITAMIN D  Take 5,000 Int'l Units by mouth daily.  . enalapril  20 MG tablet Take 1 tablet (20 mg total) by mouth daily.  . fluticasone (FLONASE)  nasal spray Place 2 sprays into both nostrils daily.  Marland Kitchen levothyroxine  50 MCG tablet TAKE ONE TABLET BY MOUTH EVERY DAY   Allergies  Allergen Reactions  . Zoloft [Sertraline Hcl] Shortness Of Breath    jittery  . Lexapro [Escitalopram Oxalate]     Per pt: unknown   PMHx:   Past Medical History  Diagnosis Date  . Hepatitis A     age 51  . Allergy   . Heart murmur   . Hypertension   . Hyperlipidemia   . Hypothyroidism    Immunization History  Administered Date(s) Administered  . PPD Test  02/22/2013, 02/24/2014  . Td 04/15/2005  . Tdap 10/27/2013   Past Surgical History  Procedure Laterality Date  . Eye surgery Right 1966    lazy eye  . Wrist surgery Right 1996  . Hydrocele excision     FHx:    Reviewed / unchanged  SHx:    Reviewed / unchanged   Review of Systems  Constitutional: Negative for fever, chills and malaise/fatigue.  HENT: Positive for sore throat. Negative for congestion and ear pain.   Respiratory: Negative for cough, shortness of breath and wheezing.   Cardiovascular: Negative for chest pain, palpitations and leg swelling.  Gastrointestinal: Negative for heartburn, nausea, vomiting, diarrhea, constipation, blood in stool and melena.  Genitourinary: Negative for dysuria, urgency and frequency.  Musculoskeletal: Negative.   Skin: Negative.   Neurological: Negative for dizziness, sensory change, loss of consciousness and headaches.  Psychiatric/Behavioral: Negative for depression. The patient is not nervous/anxious and does not have insomnia.      Physical Exam  BP 116/84 mmHg  Pulse 72  Temp(Src) 97 F (36.1 C)  Resp 16  Ht 5' 9.5" (1.765 m)  Wt 159 lb 3.2 oz (72.213 kg)  BMI 23.18 kg/m2  Appears well nourished and in no distress. Eyes: PERRLA, EOMs, conjunctiva no swelling or erythema. Sinuses: No frontal/maxillary tenderness ENT/Mouth: EAC's clear, TM's  nl w/o erythema, bulging. Nares clear w/o erythema, swelling, exudates. Oropharynx clear without erythema or exudates. Oral hygiene is good. Tongue normal, non obstructing. Hearing intact.  Neck: Supple. Thyroid nl. Car 2+/2+ without bruits, nodes or JVD. Chest: Respirations nl with BS clear & equal w/o rales, rhonchi, wheezing or stridor.  Cor: Heart sounds normal w/ regular rate and rhythm without sig. murmurs, gallops, clicks, or rubs. Peripheral pulses normal and equal  without edema.  Abdomen: Soft & bowel sounds normal. Non-tender w/o guarding, rebound, hernias, masses, or  organomegaly.  Lymphatics: Unremarkable.  Musculoskeletal: Full ROM all peripheral extremities, joint stability, 5/5 strength, and normal gait.  Skin: 2 large soft well demarcated mobile cysts greater than 5 cm which are located in the central posterior neck and also on the right trapezius.  Warm, dry without exposed rashes, lesions or ecchymosis apparent.  Neuro: Cranial nerves intact, reflexes equal bilaterally. Sensory-motor testing grossly intact. Tendon reflexes grossly intact.  Pysch: Alert & oriented x 3.  Insight and judgement nl & appropriate. No ideations.  Assessment and Plan:   1. Essential hypertension -well controlled -cont meds -monitor at home -Diet and exercise - TSH  2. Hyperlipidemia -change zocor to T,Th, Sat - Lipid panel  3. Abnormal glucose  - Hemoglobin A1c - Insulin, random  4. Vitamin D deficiency -cont supplmement - Vit D  25 hydroxy (rtn osteoporosis monitoring)  5. Hypothyroidism, unspecified hypothyroidism type -TSH  6. Medication management  - CBC with Differential/Platelet - BASIC METABOLIC PANEL WITH GFR - Hepatic function panel - Magnesium  7. Lesion of neck -two soft cystic lesions which have already been evaluated by Dr. Hassell Done who felt that due to the closeness to the neck and the brachial plexus that it likely needed to be removed by neurosurgery. - Ambulatory referral to Neurosurgery      Recommended regular exercise, BP monitoring, weight control, and discussed med and SE's. Recommended labs to assess and monitor clinical status. Further disposition pending results of labs. Over 30 minutes of exam, counseling, chart review was performed

## 2014-09-02 LAB — VITAMIN D 25 HYDROXY (VIT D DEFICIENCY, FRACTURES): Vit D, 25-Hydroxy: 63 ng/mL (ref 30–100)

## 2014-09-02 LAB — INSULIN, RANDOM: Insulin: 4.6 u[IU]/mL (ref 2.0–19.6)

## 2014-12-05 ENCOUNTER — Ambulatory Visit (INDEPENDENT_AMBULATORY_CARE_PROVIDER_SITE_OTHER): Payer: BLUE CROSS/BLUE SHIELD | Admitting: Internal Medicine

## 2014-12-05 ENCOUNTER — Encounter: Payer: Self-pay | Admitting: Internal Medicine

## 2014-12-05 ENCOUNTER — Other Ambulatory Visit: Payer: Self-pay | Admitting: Internal Medicine

## 2014-12-05 VITALS — BP 128/84 | HR 72 | Temp 97.8°F | Resp 18 | Ht 69.5 in | Wt 160.0 lb

## 2014-12-05 DIAGNOSIS — R7309 Other abnormal glucose: Secondary | ICD-10-CM

## 2014-12-05 DIAGNOSIS — G4700 Insomnia, unspecified: Secondary | ICD-10-CM

## 2014-12-05 DIAGNOSIS — E039 Hypothyroidism, unspecified: Secondary | ICD-10-CM

## 2014-12-05 DIAGNOSIS — E559 Vitamin D deficiency, unspecified: Secondary | ICD-10-CM

## 2014-12-05 DIAGNOSIS — Z79899 Other long term (current) drug therapy: Secondary | ICD-10-CM

## 2014-12-05 DIAGNOSIS — E782 Mixed hyperlipidemia: Secondary | ICD-10-CM

## 2014-12-05 DIAGNOSIS — I10 Essential (primary) hypertension: Secondary | ICD-10-CM

## 2014-12-05 LAB — HEPATIC FUNCTION PANEL
ALT: 20 U/L (ref 9–46)
AST: 20 U/L (ref 10–35)
Albumin: 4.2 g/dL (ref 3.6–5.1)
Alkaline Phosphatase: 64 U/L (ref 40–115)
Bilirubin, Direct: 0.1 mg/dL (ref <=0.2)
Indirect Bilirubin: 0.5 mg/dL (ref 0.2–1.2)
Total Bilirubin: 0.6 mg/dL (ref 0.2–1.2)
Total Protein: 7.1 g/dL (ref 6.1–8.1)

## 2014-12-05 LAB — LIPID PANEL
Cholesterol: 219 mg/dL — ABNORMAL HIGH (ref 125–200)
HDL: 55 mg/dL (ref >=40)
LDL Cholesterol: 144 mg/dL — ABNORMAL HIGH (ref <130)
Total CHOL/HDL Ratio: 4 Ratio (ref <=5.0)
Triglycerides: 101 mg/dL (ref <150)
VLDL: 20 mg/dL (ref <30)

## 2014-12-05 LAB — TSH: TSH: 6.612 u[IU]/mL — ABNORMAL HIGH (ref 0.350–4.500)

## 2014-12-05 LAB — BASIC METABOLIC PANEL WITH GFR
BUN: 16 mg/dL (ref 7–25)
CO2: 27 mmol/L (ref 20–31)
Calcium: 9.3 mg/dL (ref 8.6–10.3)
Chloride: 105 mmol/L (ref 98–110)
Creat: 0.97 mg/dL (ref 0.70–1.33)
GFR, Est African American: >89 mL/min (ref >=60)
GFR, Est Non African American: 89 mL/min (ref >=60)
Glucose, Bld: 89 mg/dL (ref 65–99)
Potassium: 4.4 mmol/L (ref 3.5–5.3)
Sodium: 140 mmol/L (ref 135–146)

## 2014-12-05 LAB — CBC WITH DIFFERENTIAL/PLATELET
Basophils Absolute: 0 10*3/uL (ref 0.0–0.1)
Basophils Relative: 0 % (ref 0–1)
Eosinophils Absolute: 0.2 10*3/uL (ref 0.0–0.7)
Eosinophils Relative: 3 % (ref 0–5)
HCT: 43.2 % (ref 39.0–52.0)
Hemoglobin: 14.8 g/dL (ref 13.0–17.0)
Lymphocytes Relative: 31 % (ref 12–46)
Lymphs Abs: 1.7 10*3/uL (ref 0.7–4.0)
MCH: 30.3 pg (ref 26.0–34.0)
MCHC: 34.3 g/dL (ref 30.0–36.0)
MCV: 88.3 fL (ref 78.0–100.0)
MPV: 9.1 fL (ref 8.6–12.4)
Monocytes Absolute: 0.3 10*3/uL (ref 0.1–1.0)
Monocytes Relative: 6 % (ref 3–12)
Neutro Abs: 3.2 10*3/uL (ref 1.7–7.7)
Neutrophils Relative %: 60 % (ref 43–77)
Platelets: 207 10*3/uL (ref 150–400)
RBC: 4.89 MIL/uL (ref 4.22–5.81)
RDW: 13.9 % (ref 11.5–15.5)
WBC: 5.4 10*3/uL (ref 4.0–10.5)

## 2014-12-05 LAB — HEMOGLOBIN A1C
Hgb A1c MFr Bld: 5.4 % (ref <5.7)
Mean Plasma Glucose: 108 mg/dL (ref <117)

## 2014-12-05 LAB — MAGNESIUM: Magnesium: 1.9 mg/dL (ref 1.5–2.5)

## 2014-12-05 MED ORDER — TRAZODONE HCL 50 MG PO TABS: 50.0000 mg | ORAL_TABLET | Freq: Every day | ORAL | Status: DC

## 2014-12-05 NOTE — Patient Instructions (Signed)
Trazodone tablets What is this medicine? TRAZODONE (TRAZ oh done) is used to treat depression. This medicine may be used for other purposes; ask your health care provider or pharmacist if you have questions. COMMON BRAND NAME(S): Desyrel What should I tell my health care provider before I take this medicine? They need to know if you have any of these conditions: -attempted suicide or thinking about it -bipolar disorder -bleeding problems -glaucoma -heart disease, or previous heart attack -irregular heart beat -kidney or liver disease -low levels of sodium in the blood -an unusual or allergic reaction to trazodone, other medicines, foods, dyes or preservatives -pregnant or trying to get pregnant -breast-feeding How should I use this medicine? Take this medicine by mouth with a glass of water. Follow the directions on the prescription label. Take this medicine shortly after a meal or a light snack. Take your medicine at regular intervals. Do not take your medicine more often than directed. Do not stop taking this medicine suddenly except upon the advice of your doctor. Stopping this medicine too quickly may cause serious side effects or your condition may worsen. A special MedGuide will be given to you by the pharmacist with each prescription and refill. Be sure to read this information carefully each time. Talk to your pediatrician regarding the use of this medicine in children. Special care may be needed. Overdosage: If you think you have taken too much of this medicine contact a poison control center or emergency room at once. NOTE: This medicine is only for you. Do not share this medicine with others. What if I miss a dose? If you miss a dose, take it as soon as you can. If it is almost time for your next dose, take only that dose. Do not take double or extra doses. What may interact with this medicine? Do not take this medicine with any of the following medications: -certain medicines  for fungal infections like fluconazole, itraconazole, ketoconazole, posaconazole, voriconazole -cisapride -dofetilide -dronedarone -linezolid -MAOIs like Carbex, Eldepryl, Marplan, Nardil, and Parnate -mesoridazine -methylene blue (injected into a vein) -pimozide -saquinavir -thioridazine -ziprasidone This medicine may also interact with the following medications: -alcohol -antiviral medicines for HIV or AIDS -aspirin and aspirin-like medicines -barbiturates like phenobarbital -certain medicines for blood pressure, heart disease, irregular heart beat -certain medicines for depression, anxiety, or psychotic disturbances -certain medicines for migraine headache like almotriptan, eletriptan, frovatriptan, naratriptan, rizatriptan, sumatriptan, zolmitriptan -certain medicines for seizures like carbamazepine and phenytoin -certain medicines for sleep -certain medicines that treat or prevent blood clots like dalteparin, enoxaparin, warfarin -digoxin -fentanyl -lithium -NSAIDS, medicines for pain and inflammation, like ibuprofen or naproxen -other medicines that prolong the QT interval (cause an abnormal heart rhythm) -rasagiline -supplements like St. John's wort, kava kava, valerian -tramadol -tryptophan This list may not describe all possible interactions. Give your health care provider a list of all the medicines, herbs, non-prescription drugs, or dietary supplements you use. Also tell them if you smoke, drink alcohol, or use illegal drugs. Some items may interact with your medicine. What should I watch for while using this medicine? Tell your doctor if your symptoms do not get better or if they get worse. Visit your doctor or health care professional for regular checks on your progress. Because it may take several weeks to see the full effects of this medicine, it is important to continue your treatment as prescribed by your doctor. Patients and their families should watch out for new  or worsening thoughts of suicide or depression. Also   watch out for sudden changes in feelings such as feeling anxious, agitated, panicky, irritable, hostile, aggressive, impulsive, severely restless, overly excited and hyperactive, or not being able to sleep. If this happens, especially at the beginning of treatment or after a change in dose, call your health care professional. Dennis Bast may get drowsy or dizzy. Do not drive, use machinery, or do anything that needs mental alertness until you know how this medicine affects you. Do not stand or sit up quickly, especially if you are an older patient. This reduces the risk of dizzy or fainting spells. Alcohol may interfere with the effect of this medicine. Avoid alcoholic drinks. This medicine may cause dry eyes and blurred vision. If you wear contact lenses you may feel some discomfort. Lubricating drops may help. See your eye doctor if the problem does not go away or is severe. Your mouth may get dry. Chewing sugarless gum, sucking hard candy and drinking plenty of water may help. Contact your doctor if the problem does not go away or is severe. What side effects may I notice from receiving this medicine? Side effects that you should report to your doctor or health care professional as soon as possible: -allergic reactions like skin rash, itching or hives, swelling of the face, lips, or tongue -fast, irregular heartbeat -feeling faint or lightheaded, falls -painful erections or other sexual dysfunction -suicidal thoughts or other mood changes -trembling Side effects that usually do not require medical attention (report to your doctor or health care professional if they continue or are bothersome): -constipation -headache -muscle aches or pains -nausea, vomiting -unusually weak or tired This list may not describe all possible side effects. Call your doctor for medical advice about side effects. You may report side effects to FDA at 1-800-FDA-1088. Where  should I keep my medicine? Keep out of the reach of children. Store at room temperature between 15 and 30 degrees C (59 to 86 degrees F). Protect from light. Keep container tightly closed. Throw away any unused medicine after the expiration date. NOTE: This sheet is a summary. It may not cover all possible information. If you have questions about this medicine, talk to your doctor, pharmacist, or health care provider.  2015, [employer]vier/Gold Standard. (2012-11-02 15:46:28)

## 2014-12-05 NOTE — Progress Notes (Signed)
Patient ID: Randy Allen, male   DOB: 01/15/1961, 54 y.o.   MRN: [id number]  Assessment and Plan:  Hypertension:  -Continue medication,  -monitor blood pressure at home.  -Continue DASH diet.   -Reminder to go to the ER if any CP, SOB, nausea, dizziness, severe HA, changes vision/speech, left arm numbness and tingling, and jaw pain.  Cholesterol: -Continue diet and exercise.  -Check cholesterol.   Pre-diabetes: -Continue diet and exercise.  -Check A1C  Vitamin D Def: -check level -continue medications.   Insomnia -trazodone  Continue diet and meds as discussed. Further disposition pending results of labs.  HPI 54 y.o. male  presents for 3 month follow up with hypertension, hyperlipidemia, prediabetes and vitamin D.   His blood pressure has been controlled at home, today their BP is BP: 128/84 mmHg.   He does not workout. He denies chest pain, shortness of breath, dizziness.  He has been trying to add in a little bit of walking.     He is on cholesterol medication and denies myalgias. His cholesterol is at goal. The cholesterol last visit was:   Lab Results  Component Value Date   CHOL 110 09/01/2014   HDL 48 09/01/2014   LDLCALC 49 09/01/2014   TRIG 65 09/01/2014   CHOLHDL 2.3 09/01/2014     He has been working on diet and exercise for prediabetes, and denies foot ulcerations, hyperglycemia, hypoglycemia , increased appetite, nausea, paresthesia of the feet, polydipsia, polyuria, visual disturbances, vomiting and weight loss. Last A1C in the office was:  Lab Results  Component Value Date   HGBA1C 5.5 09/01/2014    Patient is on Vitamin D supplement.  Lab Results  Component Value Date   VD25OH 63 09/01/2014     Patient reports that he is having a hard time falling asleep and staying asleep.  He is having a hard time with daytime sleepiness due to this.     Current Medications:  Current Outpatient Prescriptions on File Prior to Visit  Medication Sig Dispense  Refill  . atorvastatin (LIPITOR) 40 MG tablet Take 1 tablet (40 mg total) by mouth daily. 30 tablet 0  . B Complex Vitamins (VITAMIN B COMPLEX PO) Take by mouth daily.    . cetirizine (ZYRTEC) 10 MG tablet Take 10 mg by mouth as needed for allergies.    . Cholecalciferol (VITAMIN D PO) Take 5,000 Int'l Units by mouth daily.    . enalapril (VASOTEC) 20 MG tablet Take 1 tablet (20 mg total) by mouth daily. 30 tablet 0  . fluticasone (FLONASE) 50 MCG/ACT nasal spray Place 2 sprays into both nostrils daily. 16 g 0  . levothyroxine (SYNTHROID, LEVOTHROID) 50 MCG tablet TAKE ONE TABLET BY MOUTH EVERY DAY 30 tablet 0   No current facility-administered medications on file prior to visit.    Medical History:  Past Medical History  Diagnosis Date  . Hepatitis A     age 55  . Allergy   . Heart murmur   . Hypertension   . Hyperlipidemia   . Hypothyroidism     Allergies:  Allergies  Allergen Reactions  . Zoloft [Sertraline Hcl] Shortness Of Breath    jittery  . Lexapro [Escitalopram Oxalate]     Per pt: unknown     Review of Systems:  Review of Systems  Constitutional: Negative for fever, chills and malaise/fatigue.  HENT: Negative for congestion, ear pain and sore throat.   Eyes: Negative.   Respiratory: Negative for cough, shortness of  breath and wheezing.   Cardiovascular: Negative for chest pain, palpitations and leg swelling.  Gastrointestinal: Negative for heartburn, diarrhea, constipation, blood in stool and melena.  Genitourinary: Negative.   Skin: Negative.   Neurological: Negative for dizziness, sensory change, loss of consciousness and headaches.  Psychiatric/Behavioral: Negative for depression. The patient is not nervous/anxious and does not have insomnia.     Family history- Review and unchanged  Social history- Review and unchanged  Physical Exam: BP 128/84 mmHg  Pulse 72  Temp(Src) 97.8 F (36.6 C) (Temporal)  Resp 18  Ht 5' 9.5" (1.765 m)  Wt 160 lb (72.576  kg)  BMI 23.30 kg/m2 Wt Readings from Last 3 Encounters:  12/05/14 160 lb (72.576 kg)  09/01/14 159 lb 3.2 oz (72.213 kg)  08/23/14 160 lb (72.576 kg)    General Appearance: Well nourished well developed, in no apparent distress. Eyes: PERRLA, EOMs, conjunctiva no swelling or erythema ENT/Mouth: Ear canals normal without obstruction, swelling, erythma, discharge.  TMs normal bilaterally.  Oropharynx moist, clear, without exudate, or postoropharyngeal swelling. Neck: Supple, thyroid normal,no cervical adenopathy  Respiratory: Respiratory effort normal, Breath sounds clear A&P without rhonchi, wheeze, or rale.  No retractions, no accessory usage. Cardio: RRR with no MRGs. Brisk peripheral pulses without edema.  Abdomen: Soft, + BS,  Non tender, no guarding, rebound, hernias, masses. Musculoskeletal: Full ROM, 5/5 strength, Normal gait Skin: Warm, dry without rashes, lesions, ecchymosis.  Neuro: Awake and oriented X 3, Cranial nerves intact. Normal muscle tone, no cerebellar symptoms. Psych: Normal affect, Insight and Judgment appropriate.    Starlyn Skeans, PA-C 12:10 PM [facility]

## 2014-12-06 ENCOUNTER — Other Ambulatory Visit: Payer: Self-pay | Admitting: Internal Medicine

## 2014-12-06 LAB — INSULIN, RANDOM: Insulin: 4.9 u[IU]/mL (ref 2.0–19.6)

## 2014-12-06 LAB — VITAMIN D 25 HYDROXY (VIT D DEFICIENCY, FRACTURES): Vit D, 25-Hydroxy: 41 ng/mL (ref 30–100)

## 2014-12-06 MED ORDER — LEVOTHYROXINE SODIUM 75 MCG PO TABS: 75.0000 ug | ORAL_TABLET | Freq: Every day | ORAL | Status: DC

## 2015-03-08 ENCOUNTER — Encounter: Payer: Self-pay | Admitting: Internal Medicine

## 2015-03-08 ENCOUNTER — Ambulatory Visit (INDEPENDENT_AMBULATORY_CARE_PROVIDER_SITE_OTHER): Payer: BLUE CROSS/BLUE SHIELD | Admitting: Internal Medicine

## 2015-03-08 VITALS — BP 136/90 | HR 72 | Temp 97.5°F | Resp 16 | Ht 69.25 in | Wt 160.0 lb

## 2015-03-08 DIAGNOSIS — R7309 Other abnormal glucose: Secondary | ICD-10-CM

## 2015-03-08 DIAGNOSIS — E559 Vitamin D deficiency, unspecified: Secondary | ICD-10-CM | POA: Diagnosis not present

## 2015-03-08 DIAGNOSIS — E039 Hypothyroidism, unspecified: Secondary | ICD-10-CM

## 2015-03-08 DIAGNOSIS — D170 Benign lipomatous neoplasm of skin and subcutaneous tissue of head, face and neck: Secondary | ICD-10-CM

## 2015-03-08 DIAGNOSIS — Z1212 Encounter for screening for malignant neoplasm of rectum: Secondary | ICD-10-CM

## 2015-03-08 DIAGNOSIS — Z111 Encounter for screening for respiratory tuberculosis: Secondary | ICD-10-CM

## 2015-03-08 DIAGNOSIS — Z79899 Other long term (current) drug therapy: Secondary | ICD-10-CM

## 2015-03-08 DIAGNOSIS — Z8774 Personal history of (corrected) congenital malformations of heart and circulatory system: Secondary | ICD-10-CM

## 2015-03-08 DIAGNOSIS — Z125 Encounter for screening for malignant neoplasm of prostate: Secondary | ICD-10-CM

## 2015-03-08 DIAGNOSIS — Z0001 Encounter for general adult medical examination with abnormal findings: Secondary | ICD-10-CM

## 2015-03-08 DIAGNOSIS — Z0000 Encounter for general adult medical examination without abnormal findings: Secondary | ICD-10-CM | POA: Diagnosis not present

## 2015-03-08 DIAGNOSIS — Z6823 Body mass index (BMI) 23.0-23.9, adult: Secondary | ICD-10-CM

## 2015-03-08 DIAGNOSIS — I10 Essential (primary) hypertension: Secondary | ICD-10-CM

## 2015-03-08 DIAGNOSIS — E782 Mixed hyperlipidemia: Secondary | ICD-10-CM

## 2015-03-08 DIAGNOSIS — R5383 Other fatigue: Secondary | ICD-10-CM

## 2015-03-08 DIAGNOSIS — Z6824 Body mass index (BMI) 24.0-24.9, adult: Secondary | ICD-10-CM | POA: Insufficient documentation

## 2015-03-08 DIAGNOSIS — K589 Irritable bowel syndrome without diarrhea: Secondary | ICD-10-CM

## 2015-03-08 LAB — BASIC METABOLIC PANEL WITH GFR
BUN: 14 mg/dL (ref 7–25)
CO2: 27 mmol/L (ref 20–31)
Calcium: 9.5 mg/dL (ref 8.6–10.3)
Chloride: 102 mmol/L (ref 98–110)
Creat: 0.87 mg/dL (ref 0.70–1.33)
GFR, Est African American: >89 mL/min (ref >=60)
GFR, Est Non African American: >89 mL/min (ref >=60)
Glucose, Bld: 87 mg/dL (ref 65–99)
Potassium: 4.4 mmol/L (ref 3.5–5.3)
Sodium: 139 mmol/L (ref 135–146)

## 2015-03-08 LAB — CBC WITH DIFFERENTIAL/PLATELET
Basophils Absolute: 0 10*3/uL (ref 0.0–0.1)
Basophils Relative: 0 % (ref 0–1)
Eosinophils Absolute: 0.2 10*3/uL (ref 0.0–0.7)
Eosinophils Relative: 4 % (ref 0–5)
HCT: 46 % (ref 39.0–52.0)
Hemoglobin: 15.7 g/dL (ref 13.0–17.0)
Lymphocytes Relative: 31 % (ref 12–46)
Lymphs Abs: 1.7 10*3/uL (ref 0.7–4.0)
MCH: 30.5 pg (ref 26.0–34.0)
MCHC: 34.1 g/dL (ref 30.0–36.0)
MCV: 89.5 fL (ref 78.0–100.0)
MPV: 9.6 fL (ref 8.6–12.4)
Monocytes Absolute: 0.3 10*3/uL (ref 0.1–1.0)
Monocytes Relative: 6 % (ref 3–12)
Neutro Abs: 3.2 10*3/uL (ref 1.7–7.7)
Neutrophils Relative %: 59 % (ref 43–77)
Platelets: 220 10*3/uL (ref 150–400)
RBC: 5.14 MIL/uL (ref 4.22–5.81)
RDW: 13.6 % (ref 11.5–15.5)
WBC: 5.4 10*3/uL (ref 4.0–10.5)

## 2015-03-08 LAB — MAGNESIUM: Magnesium: 2.1 mg/dL (ref 1.5–2.5)

## 2015-03-08 LAB — HEPATIC FUNCTION PANEL
ALT: 33 U/L (ref 9–46)
AST: 25 U/L (ref 10–35)
Albumin: 4.5 g/dL (ref 3.6–5.1)
Alkaline Phosphatase: 69 U/L (ref 40–115)
Bilirubin, Direct: 0.1 mg/dL (ref <=0.2)
Indirect Bilirubin: 0.6 mg/dL (ref 0.2–1.2)
Total Bilirubin: 0.7 mg/dL (ref 0.2–1.2)
Total Protein: 7.4 g/dL (ref 6.1–8.1)

## 2015-03-08 LAB — HEMOGLOBIN A1C
Hgb A1c MFr Bld: 5 % (ref <5.7)
Mean Plasma Glucose: 97 mg/dL (ref <117)

## 2015-03-08 LAB — IRON AND TIBC
%SAT: 30 % (ref 15–60)
Iron: 106 ug/dL (ref 50–180)
TIBC: 355 ug/dL (ref 250–425)
UIBC: 249 ug/dL (ref 125–400)

## 2015-03-08 LAB — VITAMIN B12: Vitamin B-12: 420 pg/mL (ref 211–911)

## 2015-03-08 LAB — TSH: TSH: 3.83 u[IU]/mL (ref 0.350–4.500)

## 2015-03-08 LAB — LIPID PANEL
Cholesterol: 133 mg/dL (ref 125–200)
HDL: 59 mg/dL (ref >=40)
LDL Cholesterol: 57 mg/dL (ref <130)
Total CHOL/HDL Ratio: 2.3 Ratio (ref <=5.0)
Triglycerides: 84 mg/dL (ref <150)
VLDL: 17 mg/dL (ref <30)

## 2015-03-08 NOTE — Patient Instructions (Signed)

## 2015-03-08 NOTE — Progress Notes (Signed)
Patient ID: Randy Allen, male   DOB: May 15, 1960, 54 y.o.   MRN: [id number]  Annual  Screening/Preventative Visit And Comprehensive Evaluation & Examination  This very nice 54 y.o. MWM presents for presents for a Wellness/Preventative Visit & comprehensive evaluation and management of multiple medical co-morbidities.  Patient has been followed for HTN, Prediabetes screening, Hyperlipidemia, Hypothyroidism and Vitamin D Deficiency. Patient also requests referral for excision of 2 very large lipomas of his lower posterior neck & left shoulder areas which occasionally are uncomfortable.   HTN predates since 1999. Patient's BP has been controlled and today's BP: 136/90 mmHg. Patient denies any cardiac symptoms as chest pain, palpitations, shortness of breath, dizziness or ankle swelling.   Patient's hyperlipidemia is generally controlled with diet and medications, but he had been off of meds at last lipid check. Patient denies myalgias or other medication SE's. Last lipids were not at goal off meds with Cholesterol 219*; HDL 55; LDL 144*; Triglycerides 101 on 12/05/2014.   Patient is screened proactively for prediabetes and patient denies reactive hypoglycemic symptoms, visual blurring, diabetic polys or paresthesias. Last A1c was 12/05/2014: Hgb A1c MFr Bld 5.4% on      Patient has been on Thyroid replacement since 2011. Finally, patient has history of Vitamin D Deficiency of "12" in 2008 and last vitamin D was still relatively low at 41 on 12/05/2014.   Medication Sig  . atorvastatin  40 MG tablet Take 1 tablet (40 mg total) by mouth daily.  . B Complex Vitamins  Take by mouth daily.  . Cetirizine 10 MG tablet Take 10 mg by mouth as needed for allergies.  Marland Kitchen VITAMIN D  Take 5,000 Int'l Units by mouth daily.  . enalapril  20 MG tablet Take 1 tablet (20 mg total) by mouth daily.  Marland Kitchen FLONASE  nasal spray Place 2 sprays into both nostrils daily.  Marland Kitchen levothyroxine 75 MCG tablet Take 1 tablet (75 mcg total) by  mouth daily before breakfast.   Allergies  Allergen Reactions  . Zoloft [Sertraline Hcl] Shortness Of Breath    jittery  . Lexapro [Escitalopram Oxalate]     Per pt: unknown   Past Medical History  Diagnosis Date  . Hepatitis A     age 29  . Allergy   . Heart murmur   . Hypertension   . Hyperlipidemia   . Hypothyroidism    Health Maintenance  Topic Date Due  . INFLUENZA VACCINE  11/14/2014  . COLONOSCOPY  10/27/2023  . TETANUS/TDAP  10/28/2023  . Hepatitis C Screening  Completed  . HIV Screening  Completed   Immunization History  Administered Date(s) Administered  . PPD Test 02/22/2013, 02/24/2014  . Td 04/15/2005  . Tdap 10/27/2013   Past Surgical History  Procedure Laterality Date  . Eye surgery Right 1966    lazy eye  . Wrist surgery Right 1996  . Hydrocele excision     Family History  Problem Relation Age of Onset  . Arthritis Mother   . Depression Mother   . Migraines Mother   . Cancer Father     prostate  . Arthritis Father   . Ulcers Father   . Heart attack Father   . Colon cancer Maternal Uncle 61    Social History   Social History  . Marital Status: Married    Spouse Name: N/A  . Number of Children: N/A  . Years of Education: N/A   Occupational History  . Not on file.   Social  History Main Topics  . Smoking status: Never Smoker   . Smokeless tobacco: Never Used  . Alcohol Use: 1.0 - 1.5 oz/week    2-3 drink(s) per week  . Drug Use: No  . Sexual Activity: Active    ROS Constitutional: Denies fever, chills, weight loss/gain, headaches, insomnia,  night sweats or change in appetite. Does c/o fatigue. Eyes: Denies redness, blurred vision, diplopia, discharge, itchy or watery eyes.  ENT: Denies discharge, congestion, post nasal drip, epistaxis, sore throat, earache, hearing loss, dental pain, Tinnitus, Vertigo, Sinus pain or snoring.  Cardio: Denies chest pain, palpitations, irregular heartbeat, syncope, dyspnea, diaphoresis, orthopnea,  PND, claudication or edema Respiratory: denies cough, dyspnea, DOE, pleurisy, hoarseness, laryngitis or wheezing.  Gastrointestinal: Denies dysphagia, heartburn, reflux, water brash, pain, cramps, nausea, vomiting, bloating, diarrhea, constipation, hematemesis, melena, hematochezia, jaundice or hemorrhoids Genitourinary: Denies dysuria, frequency, urgency, nocturia, hesitancy, discharge, hematuria or flank pain Musculoskeletal: Denies arthralgia, myalgia, stiffness, Jt. Swelling, pain, limp or strain/sprain. Denies Falls. Skin: Denies puritis, rash, hives, warts, acne, eczema or change in skin lesion Neuro: No weakness, tremor, incoordination, spasms, paresthesia or pain Psychiatric: Denies confusion, memory loss or sensory loss. Denies Depression. Endocrine: Denies change in weight, skin, hair change, nocturia, and paresthesia, diabetic polys, visual blurring or hyper / hypo glycemic episodes.  Heme/Lymph: No excessive bleeding, bruising or enlarged lymph nodes.  Physical Exam  BP 136/90 mmHg  Pulse 72  Temp(Src) 97.5 F (36.4 C)  Resp 16  Ht 5' 9.25" (1.759 m)  Wt 160 lb (72.576 kg)  BMI 23.46 kg/m2  General Appearance: Well nourished, in no apparent distress. Eyes: PERRLA, EOMs, conjunctiva no swelling or erythema, normal fundi and vessels. Sinuses: No frontal/maxillary tenderness ENT/Mouth: EACs patent / TMs  nl. Nares clear without erythema, swelling, mucoid exudates. Oral hygiene is good. No erythema, swelling, or exudate. Tongue normal, non-obstructing. Tonsils not swollen or erythematous. Hearing normal.  Neck: Supple, thyroid normal. No bruits, nodes or JVD. Respiratory: Respiratory effort normal.  BS equal and clear bilateral without rales, rhonci, wheezing or stridor. Cardio: Heart sounds are normal with regular rate and rhythm and no murmurs, rubs or gallops. Peripheral pulses are normal and equal bilaterally without edema. No aortic or femoral bruits. Chest: symmetric with  normal excursions and percussion.  Abdomen: Flat, soft, with bowl sounds. Nontender, no guarding, rebound, hernias, masses, or organomegaly.  Lymphatics: Non tender without lymphadenopathy.  Genitourinary: No hernias.Testes nl. DRE - prostate nl for age - smooth & firm w/o nodules. Musculoskeletal: Full ROM all peripheral extremities, joint stability, 5/5 strength, and normal gait. Skin: Warm and dry without rashes, lesions, cyanosis, clubbing or  ecchymosis. Has 2 large soft mobile lipomas  approx 3"+  x  3"+  over posterior base of neck & left shoulder area.  Neuro: Cranial nerves intact, reflexes equal bilaterally. Normal muscle tone, no cerebellar symptoms. Sensation intact.  Pysch: Awake and oriented X 3 with normal affect, insight and judgment appropriate.   Assessment and Plan  1. Annual Preventative/Screening Exam   - Ambulatory referral to General Surgery - Microalbumin / creatinine urine ratio - EKG 12-Lead - [place], RETROPERITNL ABD,  LTD - POC Hemoccult Bld/Stl  - Urinalysis, Routine w reflex microscopic  - Vitamin B12 - Iron and TIBC - PSA - Testosterone - CBC with Differential/Platelet - BASIC METABOLIC PANEL WITH GFR - Hepatic function panel - Magnesium - Lipid panel - TSH - Hemoglobin A1c - Insulin, random - VITAMIN D 25 Hydroxy   2. Essential hypertension  -  Microalbumin / creatinine urine ratio - EKG 12-Lead - [place], RETROPERITNL ABD,  LTD - TSH  3. Hyperlipidemia  - Lipid panel - TSH  4. Abnormal glucose  - Hemoglobin A1c - Insulin, random  5. Vitamin D deficiency  - VITAMIN D 25 Hydroxy   6. Hypothyroidism, unspecified hypothyroidism type   7. Irritable bowel syndrome, unspecified type   8. History of bicuspid aortic valve   9. Body mass index (BMI) of 23.0-23.9 in adult   10. Screening for rectal cancer  - POC Hemoccult Bld/Stl   11. Prostate cancer screening  - PSA  12. Other fatigue  - Vitamin B12 - Iron and TIBC -  Testosterone - TSH  13. Medication management  - Urinalysis, Routine w reflex microscopic  - CBC with Differential/Platelet - BASIC METABOLIC PANEL WITH GFR - Hepatic function panel - Magnesium  14. Lipoma of neck  - Ambulatory referral to General Surgery  15. Screening examination for pulmonary tuberculosis  - PPD   Continue prudent diet as discussed, weight control, BP monitoring, regular exercise, and medications as discussed.  Discussed med effects and SE's. Routine screening labs and tests as requested with regular follow-up as recommended.

## 2015-03-09 LAB — PSA: PSA: 0.84 ng/mL (ref <=4.00)

## 2015-03-09 LAB — URINALYSIS, ROUTINE W REFLEX MICROSCOPIC
Bilirubin Urine: NEGATIVE
Glucose, UA: NEGATIVE
Hgb urine dipstick: NEGATIVE
Ketones, ur: NEGATIVE
Leukocytes, UA: NEGATIVE
Nitrite: NEGATIVE
Protein, ur: NEGATIVE
Specific Gravity, Urine: 1.005 (ref 1.001–1.035)
pH: 6.5 (ref 5.0–8.0)

## 2015-03-09 LAB — MICROALBUMIN / CREATININE URINE RATIO
Creatinine, Urine: 30 mg/dL (ref 20–370)
Microalb, Ur: <0.2 mg/dL

## 2015-03-09 LAB — VITAMIN D 25 HYDROXY (VIT D DEFICIENCY, FRACTURES): Vit D, 25-Hydroxy: 58 ng/mL (ref 30–100)

## 2015-03-09 LAB — TESTOSTERONE: Testosterone: 413 ng/dL (ref 300–890)

## 2015-03-10 LAB — INSULIN, RANDOM: Insulin: 5 u[IU]/mL (ref 2.0–19.6)

## 2015-03-13 ENCOUNTER — Encounter: Payer: Self-pay | Admitting: Internal Medicine

## 2015-04-11 ENCOUNTER — Other Ambulatory Visit: Payer: Self-pay | Admitting: *Deleted

## 2015-04-11 DIAGNOSIS — Z1212 Encounter for screening for malignant neoplasm of rectum: Secondary | ICD-10-CM

## 2015-04-11 DIAGNOSIS — Z0001 Encounter for general adult medical examination with abnormal findings: Secondary | ICD-10-CM

## 2015-04-11 LAB — POC HEMOCCULT BLD/STL (HOME/3-CARD/SCREEN)
Card #2 Fecal Occult Blod, POC: NEGATIVE
Card #3 Fecal Occult Blood, POC: NEGATIVE
Fecal Occult Blood, POC: NEGATIVE

## 2015-04-16 DIAGNOSIS — R221 Localized swelling, mass and lump, neck: Secondary | ICD-10-CM

## 2015-04-16 DIAGNOSIS — R2231 Localized swelling, mass and lump, right upper limb: Secondary | ICD-10-CM

## 2015-04-16 HISTORY — DX: Localized swelling, mass and lump, neck: R22.1

## 2015-04-16 HISTORY — DX: Localized swelling, mass and lump, right upper limb: R22.31

## 2015-04-20 ENCOUNTER — Encounter (HOSPITAL_BASED_OUTPATIENT_CLINIC_OR_DEPARTMENT_OTHER): Payer: Self-pay | Admitting: *Deleted

## 2015-04-25 ENCOUNTER — Ambulatory Visit: Payer: Self-pay | Admitting: Surgery

## 2015-04-25 NOTE — H&P (Signed)
Randy Allen 04/06/2015 2:45 PM Location: [facility] Patient #: [id number] DOB: 1961/03/18 Married / Language: Cleophus Molt / Race: White Male   History of Present Illness Randy Key B. Hassell Done MD; 04/06/2015 3:45 PM) The patient is a 55 year old male who presents with a soft tissue mass. The patient is referred by a primary care provider, Dr. Melford Aase. He had two lipomata when I saw him in the office when these were small. They have increased in size and they are now uncomfortable. Ready to have them removed. Will do under general at day surgery.    Other Problems Elbert Ewings, CMA; 04/06/2015 2:45 PM) Heart murmur Hepatitis High blood pressure Hypercholesterolemia Thyroid Disease  Diagnostic Studies History Elbert Ewings, CMA; 04/06/2015 2:45 PM) Colonoscopy 1-5 years ago  Allergies Elbert Ewings, CMA; 04/06/2015 2:46 PM) Zoloft *ANTIDEPRESSANTS* Lexapro *ANTIDEPRESSANTS*  Medication History Elbert Ewings, CMA; 04/06/2015 2:47 PM) Atorvastatin Calcium (40MG  Tablet, Oral) Active. Cetirizine HCl (10MG  Tablet, Oral) Active. Enalapril Maleate (20MG  Tablet, Oral) Active. Levothyroxine Sodium (75MCG Capsule, Oral) Active. Vitamin B1 (100MG  Tablet, Oral) Active. Vitamin D (2000UNIT Tablet, Oral) Active. Flonase (50MCG/ACT Suspension, Nasal) Active. Medications Reconciled  Social History Elbert Ewings, [place]; 04/06/2015 2:45 PM) Alcohol use Moderate alcohol use. Caffeine use Carbonated beverages, Coffee, Tea. No drug use Tobacco use Never smoker.  Family History Elbert Ewings, [place]; 04/06/2015 2:45 PM) Arthritis Father. Cancer Father, Mother. Depression Mother. Heart Disease Father. Heart disease in male family member before age 6 Hypertension Father. Migraine Headache Mother. Prostate Cancer Father. Respiratory Condition Father, Mother. Thyroid problems Mother.    Review of Systems Elbert Ewings CMA; 04/06/2015 2:45 PM) General Not  Present- Appetite Loss, Chills, Fatigue, Fever, Night Sweats, Weight Gain and Weight Loss. Skin Not Present- Change in Wart/Mole, Dryness, Hives, Jaundice, New Lesions, Non-Healing Wounds, Rash and Ulcer. HEENT Present- Seasonal Allergies and Wears glasses/contact lenses. Not Present- Earache, Hearing Loss, Hoarseness, Nose Bleed, Oral Ulcers, Ringing in the Ears, Sinus Pain, Sore Throat, Visual Disturbances and Yellow Eyes. Respiratory Not Present- Bloody sputum, Chronic Cough, Difficulty Breathing, Snoring and Wheezing. Breast Not Present- Breast Mass, Breast Pain, Nipple Discharge and Skin Changes. Cardiovascular Not Present- Chest Pain, Difficulty Breathing Lying Down, Leg Cramps, Palpitations, Rapid Heart Rate, Shortness of Breath and Swelling of Extremities. Gastrointestinal Not Present- Abdominal Pain, Bloating, Bloody Stool, Change in Bowel Habits, Chronic diarrhea, Constipation, Difficulty Swallowing, Excessive gas, Gets full quickly at meals, Hemorrhoids, Indigestion, Nausea, Rectal Pain and Vomiting. Male Genitourinary Not Present- Blood in Urine, Change in Urinary Stream, Frequency, Impotence, Nocturia, Painful Urination, Urgency and Urine Leakage. Musculoskeletal Not Present- Back Pain, Joint Pain, Joint Stiffness, Muscle Pain, Muscle Weakness and Swelling of Extremities. Neurological Not Present- Decreased Memory, Fainting, Headaches, Numbness, Seizures, Tingling, Tremor, Trouble walking and Weakness. Psychiatric Not Present- Anxiety, Bipolar, Change in Sleep Pattern, Depression, Fearful and Frequent crying. Endocrine Not Present- Cold Intolerance, Excessive Hunger, Hair Changes, Heat Intolerance, Hot flashes and New Diabetes. Hematology Not Present- Easy Bruising, Excessive bleeding, Gland problems, HIV and Persistent Infections.  Vitals Elbert Ewings CMA; 04/06/2015 2:48 PM) 04/06/2015 2:47 PM Weight: 155 lb Height: 69in Body Surface Area: 1.85 m Body Mass Index: 22.89 kg/m   Temp.: 98.19F(Temporal)  Pulse: 84 (Regular)  BP: 126/76 (Sitting, Left Arm, Standard)       Physical Exam (Lauro Manlove B. Hassell Done MD; 04/06/2015 3:51 PM) General Note: WDthin WM NAD 4 cm masses -- one is in the midline posteriorly and the other is on the right shoulder-soft and moveable. Lungs clear Heart SR  Assessment & Plan Randy Key B. 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

## 2015-04-26 ENCOUNTER — Ambulatory Visit (HOSPITAL_BASED_OUTPATIENT_CLINIC_OR_DEPARTMENT_OTHER): Payer: BLUE CROSS/BLUE SHIELD | Admitting: Anesthesiology

## 2015-04-26 ENCOUNTER — Encounter (HOSPITAL_BASED_OUTPATIENT_CLINIC_OR_DEPARTMENT_OTHER)
Admission: RE | Disposition: A | Discharge status: Home / Self Care | Payer: Self-pay | Source: Ambulatory Visit | Attending: Surgery

## 2015-04-26 ENCOUNTER — Ambulatory Visit (HOSPITAL_BASED_OUTPATIENT_CLINIC_OR_DEPARTMENT_OTHER)
Admission: RE | Admit: 2015-04-26 | Discharge: 2015-04-26 | Disposition: A | Discharge status: Home / Self Care | Payer: BLUE CROSS/BLUE SHIELD | Source: Ambulatory Visit | Attending: Surgery | Admitting: Surgery

## 2015-04-26 ENCOUNTER — Encounter (HOSPITAL_BASED_OUTPATIENT_CLINIC_OR_DEPARTMENT_OTHER): Payer: Self-pay | Admitting: Anesthesiology

## 2015-04-26 DIAGNOSIS — D171 Benign lipomatous neoplasm of skin and subcutaneous tissue of trunk: Secondary | ICD-10-CM | POA: Insufficient documentation

## 2015-04-26 DIAGNOSIS — Z79899 Other long term (current) drug therapy: Secondary | ICD-10-CM | POA: Insufficient documentation

## 2015-04-26 DIAGNOSIS — E7800 Pure hypercholesterolemia, unspecified: Secondary | ICD-10-CM | POA: Insufficient documentation

## 2015-04-26 DIAGNOSIS — I10 Essential (primary) hypertension: Secondary | ICD-10-CM | POA: Insufficient documentation

## 2015-04-26 DIAGNOSIS — E039 Hypothyroidism, unspecified: Secondary | ICD-10-CM | POA: Insufficient documentation

## 2015-04-26 DIAGNOSIS — D170 Benign lipomatous neoplasm of skin and subcutaneous tissue of head, face and neck: Secondary | ICD-10-CM | POA: Insufficient documentation

## 2015-04-26 HISTORY — DX: Localized swelling, mass and lump, right upper limb: R22.31

## 2015-04-26 HISTORY — DX: Vitamin D deficiency, unspecified: E55.9

## 2015-04-26 HISTORY — DX: Localized swelling, mass and lump, neck: R22.1

## 2015-04-26 HISTORY — DX: Personal history of other infectious and parasitic diseases: Z86.19

## 2015-04-26 HISTORY — DX: Personal history of Methicillin resistant Staphylococcus aureus infection: Z86.14

## 2015-04-26 HISTORY — DX: Dental restoration status: Z98.811

## 2015-04-26 HISTORY — DX: Other seasonal allergic rhinitis: J30.2

## 2015-04-26 HISTORY — PX: MASS EXCISION: SHX2000

## 2015-04-26 SURGERY — EXCISION MASS
Anesthesia: General | Site: Back | Laterality: Right

## 2015-04-26 MED ORDER — PROPOFOL 10 MG/ML IV BOLUS
INTRAVENOUS | Status: DC | PRN
  Administered 2015-04-26: 200 mg via INTRAVENOUS

## 2015-04-26 MED ORDER — ONDANSETRON HCL 4 MG/2ML IJ SOLN: 4.0000 mg | Freq: Once | INTRAMUSCULAR | Status: DC | PRN

## 2015-04-26 MED ORDER — FENTANYL CITRATE (PF) 100 MCG/2ML IJ SOLN: 25.0000 ug | INTRAMUSCULAR | Status: DC | PRN

## 2015-04-26 MED ORDER — GLYCOPYRROLATE 0.2 MG/ML IJ SOLN: 0.2000 mg | Freq: Once | INTRAMUSCULAR | Status: DC | PRN

## 2015-04-26 MED ORDER — CHLORHEXIDINE GLUCONATE 4 % EX LIQD: 1.0000 "application " | Freq: Once | CUTANEOUS | Status: DC

## 2015-04-26 MED ORDER — MIDAZOLAM HCL 2 MG/2ML IJ SOLN
INTRAMUSCULAR | Status: AC
  Filled 2015-04-26: qty 2

## 2015-04-26 MED ORDER — LIDOCAINE HCL (CARDIAC) 20 MG/ML IV SOLN
INTRAVENOUS | Status: DC | PRN
  Administered 2015-04-26: 50 mg via INTRAVENOUS

## 2015-04-26 MED ORDER — FENTANYL CITRATE (PF) 100 MCG/2ML IJ SOLN
INTRAMUSCULAR | Status: AC
  Filled 2015-04-26: qty 2

## 2015-04-26 MED ORDER — CEFAZOLIN SODIUM-DEXTROSE 2-3 GM-% IV SOLR
INTRAVENOUS | Status: DC | PRN
  Administered 2015-04-26: 2 g via INTRAVENOUS

## 2015-04-26 MED ORDER — DEXAMETHASONE SODIUM PHOSPHATE 10 MG/ML IJ SOLN
INTRAMUSCULAR | Status: AC
  Filled 2015-04-26: qty 1

## 2015-04-26 MED ORDER — PHENYLEPHRINE HCL 10 MG/ML IJ SOLN
INTRAMUSCULAR | Status: DC | PRN
  Administered 2015-04-26: 40 ug via INTRAVENOUS

## 2015-04-26 MED ORDER — DEXAMETHASONE SODIUM PHOSPHATE 4 MG/ML IJ SOLN
INTRAMUSCULAR | Status: DC | PRN
  Administered 2015-04-26: 10 mg via INTRAVENOUS

## 2015-04-26 MED ORDER — LACTATED RINGERS IV SOLN
INTRAVENOUS | Status: DC
  Administered 2015-04-26 (×2): via INTRAVENOUS

## 2015-04-26 MED ORDER — MIDAZOLAM HCL 5 MG/5ML IJ SOLN
INTRAMUSCULAR | Status: DC | PRN
Start: 2015-04-26 — End: 2015-04-26
  Administered 2015-04-26: 2 mg via INTRAVENOUS

## 2015-04-26 MED ORDER — PHENYLEPHRINE 40 MCG/ML (10ML) SYRINGE FOR IV PUSH (FOR BLOOD PRESSURE SUPPORT)
PREFILLED_SYRINGE | INTRAVENOUS | Status: AC
  Filled 2015-04-26: qty 10

## 2015-04-26 MED ORDER — PROPOFOL 10 MG/ML IV BOLUS
INTRAVENOUS | Status: AC
  Filled 2015-04-26: qty 20

## 2015-04-26 MED ORDER — ONDANSETRON HCL 4 MG/2ML IJ SOLN
INTRAMUSCULAR | Status: DC | PRN
  Administered 2015-04-26: 4 mg via INTRAVENOUS

## 2015-04-26 MED ORDER — HYDROCODONE-ACETAMINOPHEN 5-325 MG PO TABS: 1.0000 | ORAL_TABLET | ORAL | Status: DC | PRN

## 2015-04-26 MED ORDER — SUCCINYLCHOLINE CHLORIDE 20 MG/ML IJ SOLN
INTRAMUSCULAR | Status: AC
  Filled 2015-04-26: qty 1

## 2015-04-26 MED ORDER — OXYCODONE HCL 5 MG PO TABS: 5.0000 mg | ORAL_TABLET | Freq: Once | ORAL | Status: DC | PRN

## 2015-04-26 MED ORDER — CEFAZOLIN SODIUM 1 G IJ SOLR
INTRAMUSCULAR | Status: AC
  Filled 2015-04-26: qty 10

## 2015-04-26 MED ORDER — SODIUM CHLORIDE 0.9 % IJ SOLN
INTRAMUSCULAR | Status: AC
  Filled 2015-04-26: qty 10

## 2015-04-26 MED ORDER — FENTANYL CITRATE (PF) 100 MCG/2ML IJ SOLN
INTRAMUSCULAR | Status: DC | PRN
  Administered 2015-04-26: 100 ug via INTRAVENOUS

## 2015-04-26 MED ORDER — LIDOCAINE HCL (CARDIAC) 20 MG/ML IV SOLN
INTRAVENOUS | Status: AC
  Filled 2015-04-26: qty 5

## 2015-04-26 MED ORDER — SUCCINYLCHOLINE CHLORIDE 20 MG/ML IJ SOLN
INTRAMUSCULAR | Status: DC | PRN
  Administered 2015-04-26: 50 mg via INTRAVENOUS

## 2015-04-26 MED ORDER — BUPIVACAINE HCL (PF) 0.5 % IJ SOLN
INTRAMUSCULAR | Status: DC | PRN
  Administered 2015-04-26: 10 mL

## 2015-04-26 MED ORDER — SCOPOLAMINE 1 MG/3DAYS TD PT72: 1.0000 | MEDICATED_PATCH | Freq: Once | TRANSDERMAL | Status: DC

## 2015-04-26 MED ORDER — OXYCODONE HCL 5 MG/5ML PO SOLN: 5.0000 mg | Freq: Once | ORAL | Status: DC | PRN

## 2015-04-26 SURGICAL SUPPLY — 51 items
APL SKNCLS STERI-STRIP NONHPOA (GAUZE/BANDAGES/DRESSINGS)
BENZOIN TINCTURE PRP APPL 2/3 (GAUZE/BANDAGES/DRESSINGS) IMPLANT
BLADE CLIPPER SURG (BLADE) IMPLANT
BLADE SURG 15 STRL LF DISP TIS (BLADE) ×1 IMPLANT
BLADE SURG 15 STRL SS (BLADE) ×2
CANISTER SUCT 1200ML W/VALVE (MISCELLANEOUS) IMPLANT
CLEANER CAUTERY TIP 5X5 PAD (MISCELLANEOUS) ×1 IMPLANT
COVER BACK TABLE 60X90IN (DRAPES) ×2 IMPLANT
COVER MAYO STAND STRL (DRAPES) ×2 IMPLANT
DECANTER SPIKE VIAL GLASS SM (MISCELLANEOUS) IMPLANT
DRAPE LAPAROTOMY 100X72 PEDS (DRAPES) ×1 IMPLANT
DRAPE U-SHAPE 76X120 STRL (DRAPES) IMPLANT
DRSG TEGADERM 2-3/8X2-3/4 SM (GAUZE/BANDAGES/DRESSINGS) IMPLANT
ELECT REM PT RETURN 9FT ADLT (ELECTROSURGICAL) ×2
ELECTRODE REM PT RTRN 9FT ADLT (ELECTROSURGICAL) ×1 IMPLANT
GLOVE BIO SURGEON STRL SZ7 (GLOVE) ×1 IMPLANT
GLOVE BIO SURGEON STRL SZ8 (GLOVE) ×2 IMPLANT
GLOVE BIOGEL PI IND STRL 7.0 (GLOVE) IMPLANT
GLOVE BIOGEL PI IND STRL 7.5 (GLOVE) IMPLANT
GLOVE BIOGEL PI INDICATOR 7.0 (GLOVE) ×2
GLOVE BIOGEL PI INDICATOR 7.5 (GLOVE) ×1
GOWN STRL REUS W/ TWL LRG LVL3 (GOWN DISPOSABLE) ×1 IMPLANT
GOWN STRL REUS W/ TWL XL LVL3 (GOWN DISPOSABLE) ×1 IMPLANT
GOWN STRL REUS W/TWL LRG LVL3 (GOWN DISPOSABLE) ×2
GOWN STRL REUS W/TWL XL LVL3 (GOWN DISPOSABLE) ×2
LIQUID BAND (GAUZE/BANDAGES/DRESSINGS) ×1 IMPLANT
NDL HYPO 25X1 1.5 SAFETY (NEEDLE) ×1 IMPLANT
NDL PRECISIONGLIDE 27X1.5 (NEEDLE) IMPLANT
NEEDLE HYPO 25X1 1.5 SAFETY (NEEDLE) ×2 IMPLANT
NEEDLE PRECISIONGLIDE 27X1.5 (NEEDLE) IMPLANT
NS IRRIG 1000ML POUR BTL (IV SOLUTION) IMPLANT
PACK BASIN DAY SURGERY FS (CUSTOM PROCEDURE TRAY) ×2 IMPLANT
PAD CLEANER CAUTERY TIP 5X5 (MISCELLANEOUS) ×1
PENCIL BUTTON HOLSTER BLD 10FT (ELECTRODE) ×2 IMPLANT
SPONGE GAUZE 2X2 8PLY STRL LF (GAUZE/BANDAGES/DRESSINGS) ×4 IMPLANT
SPONGE GAUZE 4X4 12PLY STER LF (GAUZE/BANDAGES/DRESSINGS) ×2 IMPLANT
STRIP CLOSURE SKIN 1/2X4 (GAUZE/BANDAGES/DRESSINGS) IMPLANT
SUT ETHILON 3 0 FSL (SUTURE) IMPLANT
SUT ETHILON 5 0 PS 2 18 (SUTURE) IMPLANT
SUT VIC AB 3-0 SH 27 (SUTURE)
SUT VIC AB 3-0 SH 27X BRD (SUTURE) IMPLANT
SUT VIC AB 4-0 SH 18 (SUTURE) ×2 IMPLANT
SUT VIC AB 5-0 PS2 18 (SUTURE) IMPLANT
SUT VICRYL 3-0 CR8 SH (SUTURE) IMPLANT
SYR BULB 3OZ (MISCELLANEOUS) ×2 IMPLANT
SYR CONTROL 10ML LL (SYRINGE) ×2 IMPLANT
TOWEL OR 17X24 6PK STRL BLUE (TOWEL DISPOSABLE) ×2 IMPLANT
TRAY DSU PREP LF (CUSTOM PROCEDURE TRAY) ×2 IMPLANT
TUBE CONNECTING 20X1/4 (TUBING) ×1 IMPLANT
UNDERPAD 30X30 (UNDERPADS AND DIAPERS) IMPLANT
YANKAUER SUCT BULB TIP NO VENT (SUCTIONS) ×2 IMPLANT

## 2015-04-26 NOTE — Transfer of Care (Signed)
Immediate Anesthesia Transfer of Care Note  Patient: Randy Allen  Procedure(s) Performed: Procedure(s): EXCISION MASS NECK AND RIGHT SHOULDER  (Right)  Patient Location: PACU  Anesthesia Type:General  Level of Consciousness: awake and patient cooperative  Airway & Oxygen Therapy: Patient Spontanous Breathing and Patient connected to face mask oxygen  Post-op Assessment: Report given to RN and Post -op Vital signs reviewed and stable  Post vital signs: Reviewed and stable  Last Vitals:  Filed Vitals:   04/26/15 0843  BP: 125/85  Pulse: 61  Temp: 36.6 C  Resp: 16    Complications: No apparent anesthesia complications

## 2015-04-26 NOTE — Anesthesia Procedure Notes (Signed)
Procedure Name: Intubation Performed by: Tawni Millers Pre-anesthesia Checklist: Patient identified, Emergency Drugs available, Suction available and Patient being monitored Patient Re-evaluated:Patient Re-evaluated prior to inductionOxygen Delivery Method: Circle System Utilized Preoxygenation: Pre-oxygenation with 100% oxygen Intubation Type: IV induction Ventilation: Mask ventilation without difficulty Laryngoscope Size: Miller and 2 Grade View: Grade I Tube type: Oral Tube size: 7.0 mm Number of attempts: 1 Airway Equipment and Method: Stylet and Oral airway Placement Confirmation: ETT inserted through vocal cords under direct vision,  positive ETCO2 and breath sounds checked- equal and bilateral Tube secured with: Tape Dental Injury: Teeth and Oropharynx as per pre-operative assessment

## 2015-04-26 NOTE — Op Note (Signed)
Surgeon: Kaylyn Lim, MD, FACS  Asst:  none  Anes:  General in the prone position  Procedure: Excision of a 4 cm lipoma from the mid neck and a 6 cm lipoma from the right shoulder  Diagnosis: lipomata  Complications: none  EBL:   minimal cc  Drains: none  Description of Procedure:  The patient was taken to OR 8 at [facility].  After anesthesia was administered and the patient was prepped a timeout was performed.  The areas had been marked and I attended the nect mass first.  A transverse incision was made and a well encapsulated lipoma was shelled out in toto.  It was 4 cm in greatest dimension.  The wound was closed with 4-0 vicryl and subsequently Liquiban.  In the same operative field, I made a slightly obliquie incision over the large lipoma and encountered a capsule but it was more adherent to the surrounding tissue and I used blunt dissection to remove the mass.  The wound was closed with 4-0 vicryl.  In both excisions the bovie was used for hemostasis and Liquiban was applied at the end.    The patient tolerated the procedure well and was taken to the PACU in stable condition.     Matt B. Hassell Done, [place], [facility], [place]

## 2015-04-26 NOTE — Interval H&P Note (Signed)
History and Physical Interval Note:  04/26/2015 9:39 AM  Randy Allen  has presented today for surgery, with the diagnosis of Posterior neck and right shoulder mass   The various methods of treatment have been discussed with the patient and family. After consideration of risks, benefits and other options for treatment, the patient has consented to  Procedure(s): EXCISION MASS NECK AND RIGHT SHOULDER  (Right) as a surgical intervention .  The patient's history has been reviewed, patient examined, no change in status, stable for surgery.  I have reviewed the patient's chart and labs.  Questions were answered to the patient's satisfaction.     Kainoa Swoboda B

## 2015-04-26 NOTE — Discharge Instructions (Signed)
May shower tomorrow    Post Anesthesia Home Care Instructions  Activity: Get plenty of rest for the remainder of the day. A responsible adult should stay with you for 24 hours following the procedure.  For the next 24 hours, DO NOT: -Drive a car -Paediatric nurse -Drink alcoholic beverages -Take any medication unless instructed by your physician -Make any legal decisions or sign important papers.  Meals: Start with liquid foods such as gelatin or soup. Progress to regular foods as tolerated. Avoid greasy, spicy, heavy foods. If nausea and/or vomiting occur, drink only clear liquids until the nausea and/or vomiting subsides. Call your physician if vomiting continues.  Special Instructions/Symptoms: Your throat may feel dry or sore from the anesthesia or the breathing tube placed in your throat during surgery. If this causes discomfort, gargle with warm salt water. The discomfort should disappear within 24 hours.  If you had a scopolamine patch placed behind your ear for the management of post- operative nausea and/or vomiting:  1. The medication in the patch is effective for 72 hours, after which it should be removed.  Wrap patch in a tissue and discard in the trash. Wash hands thoroughly with soap and water. 2. You may remove the patch earlier than 72 hours if you experience unpleasant side effects which may include dry mouth, dizziness or visual disturbances. 3. Avoid touching the patch. Wash your hands with soap and water after contact with the patch.

## 2015-04-26 NOTE — Anesthesia Preprocedure Evaluation (Addendum)
Anesthesia Evaluation  Patient identified by MRN, date of birth, ID band Patient awake    Reviewed: Allergy & Precautions, NPO status , Patient's Chart, lab work & pertinent test results  History of Anesthesia Complications Negative for: history of anesthetic complications  Airway Mallampati: II  TM Distance: >3 FB Neck ROM: Full    Dental no notable dental hx. (+) Dental Advisory Given   Pulmonary neg pulmonary ROS,    Pulmonary exam normal breath sounds clear to auscultation       Cardiovascular hypertension, Pt. on medications Normal cardiovascular exam Rhythm:Regular Rate:Normal     Neuro/Psych negative neurological ROS  negative psych ROS   GI/Hepatic negative GI ROS, Neg liver ROS,   Endo/Other  Hypothyroidism   Renal/GU negative Renal ROS  negative genitourinary   Musculoskeletal negative musculoskeletal ROS (+)   Abdominal   Peds negative pediatric ROS (+)  Hematology negative hematology ROS (+)   Anesthesia Other Findings   Reproductive/Obstetrics negative OB ROS                            Anesthesia Physical Anesthesia Plan  ASA: II  Anesthesia Plan: General   Post-op Pain Management:    Induction: Intravenous  Airway Management Planned: Oral ETT  Additional Equipment:   Intra-op Plan:   Post-operative Plan: Extubation in OR  Informed Consent: I have reviewed the patients History and Physical, chart, labs and discussed the procedure including the risks, benefits and alternatives for the proposed anesthesia with the patient or authorized representative who has indicated his/her understanding and acceptance.   Dental advisory given  Plan Discussed with: CRNA  Anesthesia Plan Comments: (His "allergy" to propofol sounds more like an adverse reaction. He reports that he has had prior general anesthetics with no problems but during a colonoscopy, he reported feeling  burning in his arm while the propofol was being administered and then feeling like it was difficult to continue to breath and then he was "out". He did not wake up with any problems. Denied swelling, hives, or any additional reactions. He was told by the CRNA or anesthesiologist that these symptoms were "normal" and was not advised to avoid this medication in the future by that anesthesiologist. Given these symptoms, I am treating this as more of an adverse reaction and will use propofol for the case.)       Anesthesia Quick Evaluation

## 2015-04-26 NOTE — Anesthesia Postprocedure Evaluation (Signed)
Anesthesia Post Note  Patient: Randy Allen  Procedure(s) Performed: Procedure(s) (LRB): EXCISION MASS NECK AND RIGHT SHOULDER  (Right)  Patient location during evaluation: PACU Anesthesia Type: General Level of consciousness: awake and alert Pain management: pain level controlled Vital Signs Assessment: post-procedure vital signs reviewed and stable Respiratory status: spontaneous breathing, nonlabored ventilation, respiratory function stable and patient connected to nasal cannula oxygen Cardiovascular status: blood pressure returned to baseline and stable Postop Assessment: no signs of nausea or vomiting Anesthetic complications: no    Last Vitals:  Filed Vitals:   04/26/15 1115 04/26/15 1130  BP: 126/92   Pulse: 72 77  Temp: 36.4 C   Resp: 18 13    Last Pain: There were no vitals filed for this visit.               Mattea Seger JENNETTE

## 2015-04-26 NOTE — H&P (View-Only) (Signed)
Randy Allen 04/06/2015 2:45 PM Location: [facility] Patient #: [id number] DOB: [birth year] Married / Language: Randy Allen / Race: White Male   History of Present Illness Randy Key B. Hassell Done MD; 04/06/2015 3:45 PM) The patient is a 55 year old male who presents with a soft tissue mass. The patient is referred by a primary care provider, Dr. Melford Aase. He had two lipomata when I saw him in the office when these were small. They have increased in size and they are now uncomfortable. Ready to have them removed. Will do under general at day surgery.    Other Problems Elbert Ewings, CMA; 04/06/2015 2:45 PM) Heart murmur Hepatitis High blood pressure Hypercholesterolemia Thyroid Disease  Diagnostic Studies History Elbert Ewings, CMA; 04/06/2015 2:45 PM) Colonoscopy 1-5 years ago  Allergies Elbert Ewings, CMA; 04/06/2015 2:46 PM) Zoloft *ANTIDEPRESSANTS* Lexapro *ANTIDEPRESSANTS*  Medication History Elbert Ewings, CMA; 04/06/2015 2:47 PM) Atorvastatin Calcium (40MG  Tablet, Oral) Active. Cetirizine HCl (10MG  Tablet, Oral) Active. Enalapril Maleate (20MG  Tablet, Oral) Active. Levothyroxine Sodium (75MCG Capsule, Oral) Active. Vitamin B1 (100MG  Tablet, Oral) Active. Vitamin D (2000UNIT Tablet, Oral) Active. Flonase (50MCG/ACT Suspension, Nasal) Active. Medications Reconciled  Social History Elbert Ewings, [place]; 04/06/2015 2:45 PM) Alcohol use Moderate alcohol use. Caffeine use Carbonated beverages, Coffee, Tea. No drug use Tobacco use Never smoker.  Family History Elbert Ewings, [place]; 04/06/2015 2:45 PM) Arthritis Father. Cancer Father, Mother. Depression Mother. Heart Disease Father. Heart disease in male family member before age 49 Hypertension Father. Migraine Headache Mother. Prostate Cancer Father. Respiratory Condition Father, Mother. Thyroid problems Mother.    Review of Systems Elbert Ewings CMA; 04/06/2015 2:45 PM) General Not  Present- Appetite Loss, Chills, Fatigue, Fever, Night Sweats, Weight Gain and Weight Loss. Skin Not Present- Change in Wart/Mole, Dryness, Hives, Jaundice, New Lesions, Non-Healing Wounds, Rash and Ulcer. HEENT Present- Seasonal Allergies and Wears glasses/contact lenses. Not Present- Earache, Hearing Loss, Hoarseness, Nose Bleed, Oral Ulcers, Ringing in the Ears, Sinus Pain, Sore Throat, Visual Disturbances and Yellow Eyes. Respiratory Not Present- Bloody sputum, Chronic Cough, Difficulty Breathing, Snoring and Wheezing. Breast Not Present- Breast Mass, Breast Pain, Nipple Discharge and Skin Changes. Cardiovascular Not Present- Chest Pain, Difficulty Breathing Lying Down, Leg Cramps, Palpitations, Rapid Heart Rate, Shortness of Breath and Swelling of Extremities. Gastrointestinal Not Present- Abdominal Pain, Bloating, Bloody Stool, Change in Bowel Habits, Chronic diarrhea, Constipation, Difficulty Swallowing, Excessive gas, Gets full quickly at meals, Hemorrhoids, Indigestion, Nausea, Rectal Pain and Vomiting. Male Genitourinary Not Present- Blood in Urine, Change in Urinary Stream, Frequency, Impotence, Nocturia, Painful Urination, Urgency and Urine Leakage. Musculoskeletal Not Present- Back Pain, Joint Pain, Joint Stiffness, Muscle Pain, Muscle Weakness and Swelling of Extremities. Neurological Not Present- Decreased Memory, Fainting, Headaches, Numbness, Seizures, Tingling, Tremor, Trouble walking and Weakness. Psychiatric Not Present- Anxiety, Bipolar, Change in Sleep Pattern, Depression, Fearful and Frequent crying. Endocrine Not Present- Cold Intolerance, Excessive Hunger, Hair Changes, Heat Intolerance, Hot flashes and New Diabetes. Hematology Not Present- Easy Bruising, Excessive bleeding, Gland problems, HIV and Persistent Infections.  Vitals Elbert Ewings CMA; 04/06/2015 2:48 PM) 04/06/2015 2:47 PM Weight: 155 lb Height: 69in Body Surface Area: 1.85 m Body Mass Index: 22.89 kg/m   Temp.: 98.68F(Temporal)  Pulse: 84 (Regular)  BP: 126/76 (Sitting, Left Arm, Standard)       Physical Exam (Randy Allen B. Hassell Done MD; 04/06/2015 3:51 PM) General Note: WDthin WM NAD 4 cm masses -- one is in the midline posteriorly and the other is on the right shoulder-soft and moveable. Lungs clear Heart SR  Assessment & Plan Randy Key B. 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

## 2015-04-27 ENCOUNTER — Encounter (HOSPITAL_BASED_OUTPATIENT_CLINIC_OR_DEPARTMENT_OTHER): Payer: Self-pay | Admitting: Surgery

## 2015-06-08 ENCOUNTER — Ambulatory Visit: Payer: Self-pay | Admitting: Internal Medicine

## 2015-09-08 ENCOUNTER — Ambulatory Visit: Payer: Self-pay | Admitting: Internal Medicine

## 2016-04-18 ENCOUNTER — Ambulatory Visit (INDEPENDENT_AMBULATORY_CARE_PROVIDER_SITE_OTHER): Payer: BLUE CROSS/BLUE SHIELD | Admitting: Internal Medicine

## 2016-04-18 ENCOUNTER — Encounter: Payer: Self-pay | Admitting: Internal Medicine

## 2016-04-18 VITALS — BP 142/100 | HR 68 | Temp 97.5°F | Resp 16 | Ht 69.5 in | Wt 167.0 lb

## 2016-04-18 DIAGNOSIS — Z125 Encounter for screening for malignant neoplasm of prostate: Secondary | ICD-10-CM

## 2016-04-18 DIAGNOSIS — Z0000 Encounter for general adult medical examination without abnormal findings: Secondary | ICD-10-CM

## 2016-04-18 DIAGNOSIS — E559 Vitamin D deficiency, unspecified: Secondary | ICD-10-CM | POA: Diagnosis not present

## 2016-04-18 DIAGNOSIS — Z136 Encounter for screening for cardiovascular disorders: Secondary | ICD-10-CM

## 2016-04-18 DIAGNOSIS — Z111 Encounter for screening for respiratory tuberculosis: Secondary | ICD-10-CM

## 2016-04-18 DIAGNOSIS — R5383 Other fatigue: Secondary | ICD-10-CM

## 2016-04-18 DIAGNOSIS — B351 Tinea unguium: Secondary | ICD-10-CM

## 2016-04-18 DIAGNOSIS — R7303 Prediabetes: Secondary | ICD-10-CM

## 2016-04-18 DIAGNOSIS — Z79899 Other long term (current) drug therapy: Secondary | ICD-10-CM | POA: Diagnosis not present

## 2016-04-18 DIAGNOSIS — Z0001 Encounter for general adult medical examination with abnormal findings: Secondary | ICD-10-CM

## 2016-04-18 DIAGNOSIS — Z1212 Encounter for screening for malignant neoplasm of rectum: Secondary | ICD-10-CM

## 2016-04-18 DIAGNOSIS — I10 Essential (primary) hypertension: Secondary | ICD-10-CM

## 2016-04-18 DIAGNOSIS — E782 Mixed hyperlipidemia: Secondary | ICD-10-CM

## 2016-04-18 LAB — LIPID PANEL
Cholesterol: 132 mg/dL (ref <200)
HDL: 52 mg/dL (ref >40)
LDL Cholesterol: 56 mg/dL (ref <100)
Total CHOL/HDL Ratio: 2.5 Ratio (ref <5.0)
Triglycerides: 118 mg/dL (ref <150)
VLDL: 24 mg/dL (ref <30)

## 2016-04-18 LAB — HEMOGLOBIN A1C
Hgb A1c MFr Bld: 5 % (ref <5.7)
Mean Plasma Glucose: 97 mg/dL

## 2016-04-18 LAB — CBC WITH DIFFERENTIAL/PLATELET
Basophils Absolute: 0 cells/uL (ref 0–200)
Basophils Relative: 0 %
Eosinophils Absolute: 272 cells/uL (ref 15–500)
Eosinophils Relative: 4 %
HCT: 48.1 % (ref 38.5–50.0)
Hemoglobin: 16.4 g/dL (ref 13.2–17.1)
Lymphocytes Relative: 30 %
Lymphs Abs: 2040 cells/uL (ref 850–3900)
MCH: 30.3 pg (ref 27.0–33.0)
MCHC: 34.1 g/dL (ref 32.0–36.0)
MCV: 88.7 fL (ref 80.0–100.0)
MPV: 9 fL (ref 7.5–12.5)
Monocytes Absolute: 544 cells/uL (ref 200–950)
Monocytes Relative: 8 %
Neutro Abs: 3944 cells/uL (ref 1500–7800)
Neutrophils Relative %: 58 %
Platelets: 211 10*3/uL (ref 140–400)
RBC: 5.42 MIL/uL (ref 4.20–5.80)
RDW: 14.1 % (ref 11.0–15.0)
WBC: 6.8 10*3/uL (ref 3.8–10.8)

## 2016-04-18 LAB — URINALYSIS, ROUTINE W REFLEX MICROSCOPIC
Bilirubin Urine: NEGATIVE
Glucose, UA: NEGATIVE
Hgb urine dipstick: NEGATIVE
Ketones, ur: NEGATIVE
Leukocytes, UA: NEGATIVE
Nitrite: NEGATIVE
Protein, ur: NEGATIVE
Specific Gravity, Urine: 1.014 (ref 1.001–1.035)
pH: 5.5 (ref 5.0–8.0)

## 2016-04-18 LAB — HEPATIC FUNCTION PANEL
ALT: 49 U/L — ABNORMAL HIGH (ref 9–46)
AST: 31 U/L (ref 10–35)
Albumin: 4.4 g/dL (ref 3.6–5.1)
Alkaline Phosphatase: 71 U/L (ref 40–115)
Bilirubin, Direct: 0.2 mg/dL (ref <=0.2)
Indirect Bilirubin: 0.5 mg/dL (ref 0.2–1.2)
Total Bilirubin: 0.7 mg/dL (ref 0.2–1.2)
Total Protein: 7 g/dL (ref 6.1–8.1)

## 2016-04-18 LAB — BASIC METABOLIC PANEL WITH GFR
BUN: 18 mg/dL (ref 7–25)
CO2: 23 mmol/L (ref 20–31)
Calcium: 9.5 mg/dL (ref 8.6–10.3)
Chloride: 105 mmol/L (ref 98–110)
Creat: 1.05 mg/dL (ref 0.70–1.33)
GFR, Est African American: >89 mL/min (ref >=60)
GFR, Est Non African American: 80 mL/min (ref >=60)
Glucose, Bld: 78 mg/dL (ref 65–99)
Potassium: 4.1 mmol/L (ref 3.5–5.3)
Sodium: 141 mmol/L (ref 135–146)

## 2016-04-18 LAB — IRON AND TIBC
%SAT: 34 % (ref 15–60)
Iron: 110 ug/dL (ref 50–180)
TIBC: 321 ug/dL (ref 250–425)
UIBC: 211 ug/dL (ref 125–400)

## 2016-04-18 LAB — PSA: PSA: 0.6 ng/mL (ref <=4.0)

## 2016-04-18 LAB — TSH: TSH: 6.22 mIU/L — ABNORMAL HIGH (ref 0.40–4.50)

## 2016-04-18 LAB — VITAMIN B12: Vitamin B-12: 463 pg/mL (ref 200–1100)

## 2016-04-18 LAB — MAGNESIUM: Magnesium: 2 mg/dL (ref 1.5–2.5)

## 2016-04-18 MED ORDER — TERBINAFINE HCL 250 MG PO TABS
250.0000 mg | ORAL_TABLET | Freq: Every day | ORAL | 1 refills | Status: DC
Start: 2016-04-18 — End: 2016-07-17

## 2016-04-18 NOTE — Progress Notes (Signed)
[facility]   Unk Pinto, M.D.    Randy Allen. Silverio Lay, P.A.-C      Starlyn Skeans, P.A.-C  [facility]                [address] [place], [place] [id number] Telephone [phone] Telefax [phone] Annual  Screening/Preventative Visit  & Comprehensive Evaluation & Examination     This very nice 56 y.o. MWM presents for a Screening/Preventative Visit & comprehensive evaluation and management of multiple medical co-morbidities.  Patient has been followed for HTN, Prediabetes, Hyperlipidemia, Hypothyroidism  and Vitamin D Deficiency.     HTN predates circa 1999. Patient's BP has been controlled at home.  Today's BP is elevated at 142/100. Patient denies any cardiac symptoms as chest pain, palpitations, shortness of breath, dizziness or ankle swelling. Patient was dx'd with a bicuspid AoV in 1988 with a 2D echo by Dr Aldona Bar and has a neg ETT at that time.      Patient's hyperlipidemia is controlled with diet and medications. Patient denies myalgias or other medication SE's. Last lipids were at goal: Lab Results  Component Value Date   CHOL 133 03/08/2015   HDL 59 03/08/2015   LDLCALC 57 03/08/2015   TRIG 84 03/08/2015   CHOLHDL 2.3 03/08/2015      Patient has been on Thyroid rep;lacement since 2011. Patient has prediabetes since    and patient denies reactive hypoglycemic symptoms, visual blurring, diabetic polys or paresthesias. Last A1c was nat goal:  Lab Results  Component Value Date   HGBA1C 5.0 03/08/2015       Finally, patient has history of Vitamin D Deficiency in 2008  of "12" and last vitamin D was at goal: Lab Results  Component Value Date   VD25OH 58 03/08/2015   Current Outpatient Prescriptions on File Prior to Visit  Medication Sig  . atorvastatin (LIPITOR) 20 MG tablet Take 20 mg by mouth daily.  . B Complex Vitamins (VITAMIN B COMPLEX PO) Take by mouth daily.  .  Cholecalciferol (VITAMIN D PO) Take 5,000 Int'l Units by mouth 2 (two) times daily.   . enalapril (VASOTEC) 10 MG tablet Take 10 mg by mouth daily.  Marland Kitchen levothyroxine (SYNTHROID, LEVOTHROID) 75 MCG tablet Take 1 tablet (75 mcg total) by mouth daily before breakfast.   No current facility-administered medications on file prior to visit.    Allergies  Allergen Reactions  . Propofol Shortness Of Breath    CHEST TIGHTNESS  . Zoloft [Sertraline Hcl] Other (See Comments)    HYPERACTIVE   Past Medical History:  Diagnosis Date  . Dental crowns present   . Heart murmur    states need for antibiotics prior to dental procedures; no cardiologist  . History of hepatitis A    age 16  . History of MRSA infection 2015   right great toe  . Hyperlipidemia   . Hypertension    states under control with med., has been on med. > 10 yr.  . Hypothyroidism   . Mass of skin of right shoulder 04/2015  . Neck mass 04/2015   posterior  . Seasonal allergies   . Vitamin D deficiency    Health Maintenance  Topic Date Due  . INFLUENZA VACCINE  11/14/2015  . COLONOSCOPY  10/27/2023  . TETANUS/TDAP  10/28/2023  . Hepatitis C Screening  Completed  . HIV Screening  Completed   Immunization History  Administered Date(s) Administered  . PPD Test 02/22/2013, 02/24/2014, 03/08/2015  . Td 04/15/2005  . Tdap 10/27/2013   Past Surgical History:  Procedure Laterality Date  . COLONOSCOPY WITH PROPOFOL  10/21/2013  . EYE SURGERY Right 1966  . HYDROCELE EXCISION    . MASS EXCISION Right 04/26/2015   Procedure: EXCISION MASS NECK AND RIGHT SHOULDER ;  Surgeon: Johnathan Hausen, MD;  Location: [facility];  Service: General;  Laterality: Right;   Family History  Problem Relation Age of Onset  . Arthritis Mother   . Depression Mother   . Migraines Mother   . Cancer Father     prostate  . Arthritis Father   . Ulcers Father   . Heart attack Father   . Colon cancer Maternal Uncle 66   Social History    Social History  . Marital status: Married    Spouse name: N/A  . Number of children: N/A  . Years of education: N/A   Occupational History  . Not on file.   Social History Main Topics  . Smoking status: Never Smoker  . Smokeless tobacco: Never Used  . Alcohol use Yes     Comment: socially  . Drug use: No  . Sexual activity: Not on file    ROS Constitutional: Denies fever, chills, weight loss/gain, headaches, insomnia,  night sweats or change in appetite. Does c/o fatigue. Eyes: Denies redness, blurred vision, diplopia, discharge, itchy or watery eyes.  ENT: Denies discharge, congestion, post nasal drip, epistaxis, sore throat, earache, hearing loss, dental pain, Tinnitus, Vertigo, Sinus pain or snoring.  Cardio: Denies chest pain, palpitations, irregular heartbeat, syncope, dyspnea, diaphoresis, orthopnea, PND, claudication or edema Respiratory: denies cough, dyspnea, DOE, pleurisy, hoarseness, laryngitis or wheezing.  Gastrointestinal: Denies dysphagia, heartburn, reflux, water brash, pain, cramps, nausea, vomiting, bloating, diarrhea, constipation, hematemesis, melena, hematochezia, jaundice or hemorrhoids Genitourinary: Denies dysuria, frequency, urgency, nocturia, hesitancy, discharge, hematuria or flank pain Musculoskeletal: Denies arthralgia, myalgia, stiffness, Jt. Swelling, pain, limp or strain/sprain. Denies Falls. Skin: Denies puritis, rash, hives, warts, acne, eczema or change in skin lesion Neuro: No weakness, tremor, incoordination, spasms, paresthesia or pain Psychiatric: Denies confusion, memory loss or sensory loss. Denies Depression. Endocrine: Denies change in weight, skin, hair change, nocturia, and paresthesia, diabetic polys, visual blurring or hyper / hypo glycemic episodes.  Heme/Lymph: No excessive bleeding, bruising or enlarged lymph nodes.  Physical Exam  BP (!) 142/100   Pulse 68   Temp 97.5 F (36.4 C)   Resp 16   Ht 5' 9.5" (1.765 m)   Wt 167  lb (75.8 kg)   BMI 24.31 kg/m   General Appearance: Well nourished, in no apparent distress.  Eyes: PERRLA, EOMs, conjunctiva no swelling or erythema, normal fundi and vessels. Sinuses: No frontal/maxillary tenderness ENT/Mouth: EACs patent / TMs  nl. Nares clear without erythema, swelling, mucoid exudates. Oral hygiene is good. No erythema, swelling, or exudate. Tongue normal, non-obstructing. Tonsils not swollen or erythematous. Hearing normal.  Neck: Supple, thyroid normal. No bruits, nodes or JVD. Respiratory: Respiratory effort normal.  BS equal and clear bilateral without rales, rhonci, wheezing or stridor. Cardio: Heart sounds are normal with regular rate and rhythm and soft Gr 1-2/4 blowing AoSys M max at the 2sd R ICS. Peripheral pulses are normal and equal bilaterally without edema. No aortic or femoral bruits. Chest: symmetric with normal excursions and percussion.  Abdomen: Soft, with Nl bowel sounds. Nontender, no guarding, rebound, hernias, masses, or  organomegaly.  Lymphatics: Non tender without lymphadenopathy.  Genitourinary: No hernias.Testes nl. DRE - prostate nl for age - smooth & firm w/o nodules. Musculoskeletal: Full ROM all peripheral extremities, joint stability, 5/5 strength, and normal gait. Skin: Warm and dry without rashes, lesions, cyanosis, clubbing or  ecchymosis. Dystrophic thickened chalky toenails 1, 2  & 5th toes bilat.  Neuro: Cranial nerves intact, reflexes equal bilaterally. Normal muscle tone, no cerebellar symptoms. Sensation intact.  Pysch: Alert and oriented X 3 with normal affect, insight and judgment appropriate.   Assessment and Plan  1. Annual Preventative/Screening Exam    2. Essential hypertension  - Microalbumin / creatinine urine ratio - EKG 12-Lead - Urinalysis, Routine w reflex microscopic - CBC with Differential/Platelet - BASIC METABOLIC PANEL WITH GFR - TSH  3. Hyperlipidemia  - EKG 12-Lead - Hepatic function panel - Lipid  panel - TSH  4. Prediabetes  - EKG 12-Lead - Hemoglobin A1c - Insulin, random  5. Vitamin D deficiency  - VITAMIN D 25 Hydroxy   6. Screening examination for pulmonary tuberculosis  - PPD  7. Onychomycosis  - terbinafine (LAMISIL) 250 MG tablet; Take 1 tablet (250 mg total) by mouth daily.  Dispense: 90 tablet; Refill: 1  8. Screening for rectal cancer  - POC Hemoccult Bld/Stl   9. Prostate cancer screening  - PSA  10. Screening for ischemic heart disease  - EKG 12-Lead  11. Screening for AAA (aortic abdominal aneurysm)   12. Fatigue, unspecified type  - Vitamin B12 - Iron and TIBC - Testosterone - CBC with Differential/Platelet  13. Medication management  - Urinalysis, Routine w reflex microscopic - CBC with Differential/Platelet - BASIC METABOLIC PANEL WITH GFR - Hepatic function panel - Magnesium       Continue prudent diet as discussed, weight control, BP monitoring, regular exercise, and medications as discussed.  Discussed med effects and SE's. Routine screening labs and tests as requested with regular follow-up as recommended. Over 40 minutes of exam, counseling, chart review and high complex critical decision making was performed

## 2016-04-18 NOTE — Patient Instructions (Signed)

## 2016-04-19 LAB — MICROALBUMIN / CREATININE URINE RATIO
Creatinine, Urine: 100 mg/dL (ref 20–370)
Microalb Creat Ratio: 3 mcg/mg creat (ref <30)
Microalb, Ur: 0.3 mg/dL

## 2016-04-19 LAB — VITAMIN D 25 HYDROXY (VIT D DEFICIENCY, FRACTURES): Vit D, 25-Hydroxy: 62 ng/mL (ref 30–100)

## 2016-04-19 LAB — TESTOSTERONE: Testosterone: 551 ng/dL (ref 250–827)

## 2016-04-19 LAB — INSULIN, RANDOM: Insulin: 5.9 u[IU]/mL (ref 2.0–19.6)

## 2016-04-22 ENCOUNTER — Encounter: Payer: Self-pay | Admitting: Internal Medicine

## 2016-04-22 LAB — TB SKIN TEST
Induration: 0 mm
TB Skin Test: NEGATIVE

## 2016-05-30 ENCOUNTER — Other Ambulatory Visit: Payer: Self-pay | Admitting: Internal Medicine

## 2016-05-30 ENCOUNTER — Other Ambulatory Visit: Payer: Self-pay

## 2016-05-30 ENCOUNTER — Other Ambulatory Visit: Payer: BLUE CROSS/BLUE SHIELD

## 2016-05-30 DIAGNOSIS — R7989 Other specified abnormal findings of blood chemistry: Secondary | ICD-10-CM

## 2016-05-30 DIAGNOSIS — E039 Hypothyroidism, unspecified: Secondary | ICD-10-CM

## 2016-05-30 DIAGNOSIS — I10 Essential (primary) hypertension: Secondary | ICD-10-CM | POA: Diagnosis not present

## 2016-05-30 DIAGNOSIS — R945 Abnormal results of liver function studies: Secondary | ICD-10-CM

## 2016-05-30 DIAGNOSIS — E782 Mixed hyperlipidemia: Secondary | ICD-10-CM | POA: Diagnosis not present

## 2016-05-30 DIAGNOSIS — Z79899 Other long term (current) drug therapy: Secondary | ICD-10-CM

## 2016-05-30 LAB — HEPATIC FUNCTION PANEL
ALT: 38 U/L (ref 9–46)
AST: 25 U/L (ref 10–35)
Albumin: 4.3 g/dL (ref 3.6–5.1)
Alkaline Phosphatase: 72 U/L (ref 40–115)
Bilirubin, Direct: 0.1 mg/dL (ref <=0.2)
Indirect Bilirubin: 0.3 mg/dL (ref 0.2–1.2)
Total Bilirubin: 0.4 mg/dL (ref 0.2–1.2)
Total Protein: 7 g/dL (ref 6.1–8.1)

## 2016-05-30 LAB — TSH: TSH: 4.03 mIU/L (ref 0.40–4.50)

## 2016-05-30 LAB — GAMMA GT: GGT: 49 U/L (ref 7–51)

## 2016-06-10 ENCOUNTER — Other Ambulatory Visit: Payer: Self-pay | Admitting: Internal Medicine

## 2016-07-17 ENCOUNTER — Ambulatory Visit (INDEPENDENT_AMBULATORY_CARE_PROVIDER_SITE_OTHER): Payer: BLUE CROSS/BLUE SHIELD | Admitting: Internal Medicine

## 2016-07-17 ENCOUNTER — Encounter: Payer: Self-pay | Admitting: Internal Medicine

## 2016-07-17 VITALS — BP 126/78 | HR 68 | Temp 98.2°F | Resp 14 | Ht 69.5 in | Wt 170.0 lb

## 2016-07-17 DIAGNOSIS — Z79899 Other long term (current) drug therapy: Secondary | ICD-10-CM | POA: Diagnosis not present

## 2016-07-17 DIAGNOSIS — I10 Essential (primary) hypertension: Secondary | ICD-10-CM

## 2016-07-17 DIAGNOSIS — E039 Hypothyroidism, unspecified: Secondary | ICD-10-CM

## 2016-07-17 DIAGNOSIS — B351 Tinea unguium: Secondary | ICD-10-CM

## 2016-07-17 DIAGNOSIS — J309 Allergic rhinitis, unspecified: Secondary | ICD-10-CM

## 2016-07-17 DIAGNOSIS — E782 Mixed hyperlipidemia: Secondary | ICD-10-CM | POA: Diagnosis not present

## 2016-07-17 DIAGNOSIS — R0683 Snoring: Secondary | ICD-10-CM | POA: Diagnosis not present

## 2016-07-17 LAB — CBC WITH DIFFERENTIAL/PLATELET
Basophils Absolute: 57 cells/uL (ref 0–200)
Basophils Relative: 1 %
Eosinophils Absolute: 285 cells/uL (ref 15–500)
Eosinophils Relative: 5 %
HCT: 42.2 % (ref 38.5–50.0)
Hemoglobin: 14.3 g/dL (ref 13.2–17.1)
Lymphocytes Relative: 28 %
Lymphs Abs: 1596 cells/uL (ref 850–3900)
MCH: 30.4 pg (ref 27.0–33.0)
MCHC: 33.9 g/dL (ref 32.0–36.0)
MCV: 89.6 fL (ref 80.0–100.0)
MPV: 9 fL (ref 7.5–12.5)
Monocytes Absolute: 399 cells/uL (ref 200–950)
Monocytes Relative: 7 %
Neutro Abs: 3363 cells/uL (ref 1500–7800)
Neutrophils Relative %: 59 %
Platelets: 221 10*3/uL (ref 140–400)
RBC: 4.71 MIL/uL (ref 4.20–5.80)
RDW: 14 % (ref 11.0–15.0)
WBC: 5.7 10*3/uL (ref 3.8–10.8)

## 2016-07-17 LAB — BASIC METABOLIC PANEL WITH GFR
BUN: 12 mg/dL (ref 7–25)
CO2: 27 mmol/L (ref 20–31)
Calcium: 9.4 mg/dL (ref 8.6–10.3)
Chloride: 106 mmol/L (ref 98–110)
Creat: 0.99 mg/dL (ref 0.70–1.33)
GFR, Est African American: >89 mL/min (ref >=60)
GFR, Est Non African American: 85 mL/min (ref >=60)
Glucose, Bld: 97 mg/dL (ref 65–99)
Potassium: 4.4 mmol/L (ref 3.5–5.3)
Sodium: 140 mmol/L (ref 135–146)

## 2016-07-17 LAB — HEPATIC FUNCTION PANEL
ALT: 30 U/L (ref 9–46)
AST: 25 U/L (ref 10–35)
Albumin: 4.1 g/dL (ref 3.6–5.1)
Alkaline Phosphatase: 67 U/L (ref 40–115)
Bilirubin, Direct: 0.1 mg/dL (ref <=0.2)
Indirect Bilirubin: 0.3 mg/dL (ref 0.2–1.2)
Total Bilirubin: 0.4 mg/dL (ref 0.2–1.2)
Total Protein: 6.6 g/dL (ref 6.1–8.1)

## 2016-07-17 MED ORDER — PROMETHAZINE-DM 6.25-15 MG/5ML PO SYRP: ORAL_SOLUTION | ORAL | 1 refills | Status: DC

## 2016-07-17 MED ORDER — AZELASTINE-FLUTICASONE 137-50 MCG/ACT NA SUSP: 2.0000 | Freq: Every day | NASAL | 2 refills | Status: DC

## 2016-07-17 MED ORDER — TERBINAFINE HCL 250 MG PO TABS: 250.0000 mg | ORAL_TABLET | Freq: Every day | ORAL | 1 refills | Status: DC

## 2016-07-17 MED ORDER — TRAZODONE HCL 100 MG PO TABS: 100.0000 mg | ORAL_TABLET | Freq: Every day | ORAL | 1 refills | Status: DC

## 2016-07-17 NOTE — Progress Notes (Signed)
Assessment and Plan:  Hypertension:  -well controlled -Continue medication,  -monitor blood pressure at home.  -Continue DASH diet.   -Reminder to go to the ER if any CP, SOB, nausea, dizziness, severe HA, changes vision/speech, left arm numbness and tingling, and jaw pain.  Cholesterol: -cont lipitor -at goal -Continue diet and exercise.  -Check cholesterol.   Onychomycosis -stop lamasil for 1 month, then take every other month -stop after next 90 day supply finished  Hypothyroidism -TSH -cont levothyroxine -dose adjust if necessary  Allergic rhinitis -cont daily anithistamine -dymista -if not covered may need to split to astelin and flonase  Snoring -nasal saline prior to bed -tighter control of allergies -trazodone for sleep instead of benzodiazepines -at home sleep study for sleep apnea -avoid alcohol  Vitamin D Def: -continue medications.     Continue diet and meds as discussed. Further disposition pending results of labs.  HPI 56 y.o. male  presents for 3 month follow up with hypertension, hyperlipidemia, prediabetes and vitamin D.   His blood pressure has been controlled at home, today their BP is BP: 126/78.   He does workout. He denies chest pain, shortness of breath, dizziness.   He is on cholesterol medication and denies myalgias. His cholesterol is at goal. The cholesterol last visit was:   Lab Results  Component Value Date   CHOL 132 04/18/2016   HDL 52 04/18/2016   LDLCALC 56 04/18/2016   TRIG 118 04/18/2016   CHOLHDL 2.5 04/18/2016  He is taking his lipitor.  He does not need refills.  He reports that he has a stock pile of medications.     He is taking the lamasil for his toenails.  He is getting clearance at the bottom of his nails.  He has been on this for the last 3 months.   Patient is on Vitamin D supplement.  Lab Results  Component Value Date   VD25OH 19 04/18/2016      He reports that his allergies are bothering him.  He reports  that he is having congestion, clear runny nose, and dry sore throat.  He reports that he rubs his eyes lot.  Her has been using flonase but he did run out.  He also is taking zyrtec.    He reports that his wife tells him that he is snoring a lot.  He feels like it is due to congestion.  The snoring is positional sometimes.  He does not wake himself up.  He reports that he doesn't gasp for air.  His wife reports some apneic episodes.  He does not have morning headaches.  He generally sleeps on 1 pillow.  He has a movable base.  He just got it.  He reports that he does occasionally drink alcohol prior to bed.  He has 1-2 glasses of wine at the most.  He reports that he has had to take ambien in the past.  He reports that he has tried other stuff.  He has also tried xanax.    He is still taking thyroid medication first thing on an empty stomach.  He occasionally forgets it on the weekends.        Current Medications:  Current Outpatient Prescriptions on File Prior to Visit  Medication Sig Dispense Refill  . atorvastatin (LIPITOR) 20 MG tablet Take 20 mg by mouth daily.    . B Complex Vitamins (VITAMIN B COMPLEX PO) Take by mouth daily.    . Cholecalciferol (VITAMIN D PO) Take 5,000  Int'l Units by mouth 2 (two) times daily.     . enalapril (VASOTEC) 10 MG tablet Take 10 mg by mouth daily.    Marland Kitchen levothyroxine (SYNTHROID, LEVOTHROID) 50 MCG tablet TAKE ONE TABLET BY MOUTH EVERY DAY 90 tablet 0  . terbinafine (LAMISIL) 250 MG tablet Take 1 tablet (250 mg total) by mouth daily. 90 tablet 1   No current facility-administered medications on file prior to visit.     Medical History:  Past Medical History:  Diagnosis Date  . Dental crowns present   . Heart murmur    states need for antibiotics prior to dental procedures; no cardiologist  . History of hepatitis A    age 62  . History of MRSA infection 2015   right great toe  . Hyperlipidemia   . Hypertension    states under control with med., has  been on med. > 10 yr.  . Hypothyroidism   . Mass of skin of right shoulder 04/2015  . Neck mass 04/2015   posterior  . Seasonal allergies   . Vitamin D deficiency     Allergies:  Allergies  Allergen Reactions  . Propofol Shortness Of Breath    CHEST TIGHTNESS  . Zoloft [Sertraline Hcl] Other (See Comments)    HYPERACTIVE     Review of Systems:  Review of Systems  Constitutional: Negative for chills, fever and malaise/fatigue.  HENT: Negative for congestion, ear pain and sore throat.   Eyes: Negative.   Respiratory: Negative for cough, shortness of breath and wheezing.   Cardiovascular: Negative for chest pain, palpitations and leg swelling.  Gastrointestinal: Negative for abdominal pain, blood in stool, constipation, diarrhea, heartburn and melena.  Genitourinary: Negative.   Skin: Negative.   Neurological: Negative for dizziness, sensory change, loss of consciousness and headaches.  Psychiatric/Behavioral: Negative for depression. The patient is not nervous/anxious and does not have insomnia.     Family history- Review and unchanged  Social history- Review and unchanged  Physical Exam: BP 126/78   Pulse 68   Temp 98.2 F (36.8 C) (Temporal)   Resp 14   Ht 5' 9.5" (1.765 m)   Wt 170 lb (77.1 kg)   BMI 24.74 kg/m  Wt Readings from Last 3 Encounters:  07/17/16 170 lb (77.1 kg)  04/18/16 167 lb (75.8 kg)  04/26/15 157 lb 4 oz (71.3 kg)    General Appearance: Well nourished well developed, in no apparent distress. Eyes: PERRLA, EOMs, conjunctiva no swelling or erythema ENT/Mouth: Ear canals normal without obstruction, swelling, erythma, discharge.  TMs normal bilaterally.  Oropharynx moist, clear, without exudate, or postoropharyngeal swelling. Neck: Supple, thyroid normal,no cervical adenopathy  Respiratory: Respiratory effort normal, Breath sounds clear A&P without rhonchi, wheeze, or rale.  No retractions, no accessory usage. Cardio: RRR with no MRGs. Brisk  peripheral pulses without edema.  Abdomen: Soft, + BS,  Non tender, no guarding, rebound, hernias, masses. Musculoskeletal: Full ROM, 5/5 strength, Normal gait Skin: Warm, dry without rashes, lesions, ecchymosis.  Neuro: Awake and oriented X 3, Cranial nerves intact. Normal muscle tone, no cerebellar symptoms. Psych: Normal affect, Insight and Judgment appropriate.    Starlyn Skeans, PA-C 5:08 PM [facility]

## 2016-07-18 LAB — TSH: TSH: 3.23 mIU/L (ref 0.40–4.50)

## 2016-10-22 ENCOUNTER — Ambulatory Visit: Payer: Self-pay | Admitting: Internal Medicine

## 2017-05-20 ENCOUNTER — Encounter: Payer: Self-pay | Admitting: Internal Medicine

## 2017-05-20 ENCOUNTER — Ambulatory Visit (INDEPENDENT_AMBULATORY_CARE_PROVIDER_SITE_OTHER): Payer: BLUE CROSS/BLUE SHIELD | Admitting: Internal Medicine

## 2017-05-20 VITALS — BP 152/94 | HR 60 | Temp 97.5°F | Resp 16 | Ht 69.0 in | Wt 169.6 lb

## 2017-05-20 DIAGNOSIS — E559 Vitamin D deficiency, unspecified: Secondary | ICD-10-CM

## 2017-05-20 DIAGNOSIS — I10 Essential (primary) hypertension: Secondary | ICD-10-CM

## 2017-05-20 DIAGNOSIS — Z0001 Encounter for general adult medical examination with abnormal findings: Secondary | ICD-10-CM

## 2017-05-20 DIAGNOSIS — Z8249 Family history of ischemic heart disease and other diseases of the circulatory system: Secondary | ICD-10-CM | POA: Insufficient documentation

## 2017-05-20 DIAGNOSIS — Z0000 Encounter for general adult medical examination without abnormal findings: Secondary | ICD-10-CM | POA: Diagnosis not present

## 2017-05-20 DIAGNOSIS — Z136 Encounter for screening for cardiovascular disorders: Secondary | ICD-10-CM

## 2017-05-20 DIAGNOSIS — E039 Hypothyroidism, unspecified: Secondary | ICD-10-CM

## 2017-05-20 DIAGNOSIS — R7303 Prediabetes: Secondary | ICD-10-CM

## 2017-05-20 DIAGNOSIS — E782 Mixed hyperlipidemia: Secondary | ICD-10-CM

## 2017-05-20 DIAGNOSIS — Z79899 Other long term (current) drug therapy: Secondary | ICD-10-CM

## 2017-05-20 DIAGNOSIS — R5383 Other fatigue: Secondary | ICD-10-CM

## 2017-05-20 LAB — HEPATIC FUNCTION PANEL
AG Ratio: 1.7 (calc) (ref 1.0–2.5)
ALT: 27 U/L (ref 9–46)
AST: 25 U/L (ref 10–35)
Albumin: 4.4 g/dL (ref 3.6–5.1)
Alkaline phosphatase (APISO): 75 U/L (ref 40–115)
Bilirubin, Direct: 0.1 mg/dL (ref 0.0–0.2)
Globulin: 2.6 g/dL (calc) (ref 1.9–3.7)
Indirect Bilirubin: 0.6 mg/dL (calc) (ref 0.2–1.2)
Total Bilirubin: 0.7 mg/dL (ref 0.2–1.2)
Total Protein: 7 g/dL (ref 6.1–8.1)

## 2017-05-20 LAB — LIPID PANEL
Cholesterol: 134 mg/dL (ref <200)
HDL: 52 mg/dL (ref >40)
LDL Cholesterol (Calc): 65 mg/dL (calc)
Non-HDL Cholesterol (Calc): 82 mg/dL (calc) (ref <130)
Total CHOL/HDL Ratio: 2.6 (calc) (ref <5.0)
Triglycerides: 83 mg/dL (ref <150)

## 2017-05-20 LAB — BASIC METABOLIC PANEL WITH GFR
BUN: 13 mg/dL (ref 7–25)
CO2: 32 mmol/L (ref 20–32)
Calcium: 9.6 mg/dL (ref 8.6–10.3)
Chloride: 106 mmol/L (ref 98–110)
Creat: 0.99 mg/dL (ref 0.70–1.33)
GFR, Est African American: 98 mL/min/{1.73_m2} (ref >=60)
GFR, Est Non African American: 85 mL/min/{1.73_m2} (ref >=60)
Glucose, Bld: 89 mg/dL (ref 65–99)
Potassium: 4.2 mmol/L (ref 3.5–5.3)
Sodium: 143 mmol/L (ref 135–146)

## 2017-05-20 LAB — TSH: TSH: 4.54 mIU/L — ABNORMAL HIGH (ref 0.40–4.50)

## 2017-05-20 NOTE — Progress Notes (Signed)
[facility]   Unk Pinto, M.D.     Uvaldo Bristle. Silverio Lay, P.A.-C Liane Comber, [place]                [address] [place] [address] Telephone [phone] Telefax [phone] Annual  Screening/Preventative Visit  & Comprehensive Evaluation & Examination     This very nice 57 y.o. MWM presents for a Screening/Preventative Visit & comprehensive evaluation and management of multiple medical co-morbidities.  Patient has been followed for HTN, Prediabetes, Hyperlipidemia and Vitamin D Deficiency.     HTN predates since 1999. Patient's BP has been controlled at home.  Today's BP is elevated at 152/94, altho he reports home BP's usu range about "128/84". Patient denies any cardiac symptoms as chest pain, palpitations, shortness of breath, dizziness or ankle swelling. Patient was dx'd with a bicuspid AoV in 1988 with a 2D echo by Dr Aldona Bar and has a neg ETT at that time.      Patient's hyperlipidemia is controlled with diet and medications. Patient denies myalgias or other medication SE's. Last lipids were  Lab Results  Component Value Date   CHOL 132 04/18/2016   HDL 52 04/18/2016   LDLCALC 56 04/18/2016   TRIG 118 04/18/2016   CHOLHDL 2.5 04/18/2016      Patient is followed expectantly for  prediabetes and patient denies reactive hypoglycemic symptoms, visual blurring, diabetic polys or paresthesias. Last A1c was Normal & at goal: Lab Results  Component Value Date   HGBA1C 5.0 04/18/2016       Patient has been on Thyroid replacement since 2011. Finally, patient has history of Vitamin D Deficiency ("12"/2008) and last vitamin D was at goal: Lab Results  Component Value Date   VD25OH 62 04/18/2016   Current Outpatient Medications on File Prior to Visit  Medication Sig  . atorvastatin (LIPITOR) 40 MG tablet Take 40 mg by mouth daily. Takes 1/2 to 1 daily  . Azelastine-Fluticasone  137-50 MCG/ACT SUSP Place 2 sprays into the nose daily.  . B Complex Vitamins (VITAMIN B COMPLEX PO) Take by mouth daily.  . cetirizine (ZYRTEC) 10 MG tablet Take 10 mg by mouth daily. OTC  . Cholecalciferol (VITAMIN D PO) Take 5,000 Int'l Units by mouth daily.   . enalapril (VASOTEC) 20 MG tablet Take 20 mg by mouth daily. Takes 1/2 to 1 daily  . famotidine (PEPCID) 20 MG tablet Take 20 mg by mouth daily. OTC  . levothyroxine (SYNTHROID, LEVOTHROID) 50 MCG tablet TAKE ONE TABLET BY MOUTH EVERY DAY   No current facility-administered medications on file prior to visit.    Allergies  Allergen Reactions  . Propofol Shortness Of Breath    CHEST TIGHTNESS  . Zoloft [Sertraline Hcl] Other (See Comments)    HYPERACTIVE   Past Medical History:  Diagnosis Date  . Dental crowns present   . Heart murmur    states need for antibiotics prior to dental procedures; no cardiologist  . History of hepatitis A    age 53  . History of MRSA infection 2015   right great toe  . Hyperlipidemia   . Hypertension    states under control with med., has been on med. > 10 yr.  . Hypothyroidism   . Mass of skin of right shoulder 04/2015  . Neck mass 04/2015   posterior  . Seasonal allergies   .  Vitamin D deficiency    Health Maintenance  Topic Date Due  . INFLUENZA VACCINE  11/13/2016  . COLONOSCOPY  10/27/2023  . TETANUS/TDAP  10/28/2023  . Hepatitis C Screening  Completed  . HIV Screening  Completed   Immunization History  Administered Date(s) Administered  . PPD Test 02/22/2013, 02/24/2014, 03/08/2015, 04/18/2016  . Td 04/15/2005  . Tdap 10/27/2013   Last Colon - 10/16/2013 - Dr Deatra Ina - recc 10 yr f/u  & due 7/ 2025. Past Surgical History:  Procedure Laterality Date  . COLONOSCOPY WITH PROPOFOL  10/21/2013  . EYE SURGERY Right 1966  . HYDROCELE EXCISION    . MASS EXCISION Right 04/26/2015   Procedure: EXCISION MASS NECK AND RIGHT SHOULDER ;  Surgeon: Johnathan Hausen, MD;  Location: [facility];  Service: General;  Laterality: Right;   Family History  Problem Relation Age of Onset  . Arthritis Mother   . Depression Mother   . Migraines Mother   . Cancer Father        prostate  . Arthritis Father   . Ulcers Father   . Heart attack Father   . Colon cancer Maternal Uncle 42   Social History   Socioeconomic History  . Marital status: Married    Spouse name: Geni Bers   Occupational History  . Not on file  Tobacco Use  . Smoking status: Never Smoker  . Smokeless tobacco: Never Used  Substance and Sexual Activity  . Alcohol use: Yes,  socially  . Drug use: No  . Sexual activity: Active    ROS Constitutional: Denies fever, chills, weight loss/gain, headaches, insomnia,  night sweats or change in appetite. Does c/o fatigue. Eyes: Denies redness, blurred vision, diplopia, discharge, itchy or watery eyes.  ENT: Denies discharge, congestion, post nasal drip, epistaxis, sore throat, earache, hearing loss, dental pain, Tinnitus, Vertigo, Sinus pain or snoring.  Cardio: Denies chest pain, palpitations, irregular heartbeat, syncope, dyspnea, diaphoresis, orthopnea, PND, claudication or edema Respiratory: denies cough, dyspnea, DOE, pleurisy, hoarseness, laryngitis or wheezing.  Gastrointestinal: Denies dysphagia, heartburn, reflux, water brash, pain, cramps, nausea, vomiting, bloating, diarrhea, constipation, hematemesis, melena, hematochezia, jaundice or hemorrhoids Genitourinary: Denies dysuria, frequency, urgency, nocturia, hesitancy, discharge, hematuria or flank pain Musculoskeletal: Denies arthralgia, myalgia, stiffness, Jt. Swelling, pain, limp or strain/sprain. Denies Falls. Skin: Denies puritis, rash, hives, warts, acne, eczema or change in skin lesion Neuro: No weakness, tremor, incoordination, spasms, paresthesia or pain Psychiatric: Denies confusion, memory loss or sensory loss. Denies Depression. Endocrine: Denies change in weight, skin, hair change,  nocturia, and paresthesia, diabetic polys, visual blurring or hyper / hypo glycemic episodes.  Heme/Lymph: No excessive bleeding, bruising or enlarged lymph nodes.  Physical Exam  BP (!) 152/94   Pulse 60   Temp (!) 97.5 F (36.4 C)   Resp 16   Ht 5\' 9"  (1.753 m)   Wt 169 lb 9.6 oz (76.9 kg)   BMI 25.05 kg/m   General Appearance: Well nourished and well groomed and in no apparent distress.  Eyes: PERRLA, EOMs, conjunctiva no swelling or erythema, normal fundi and vessels. Sinuses: No frontal/maxillary tenderness ENT/Mouth: EACs patent / TMs  nl. Nares clear without erythema, swelling, mucoid exudates. Oral hygiene is good. No erythema, swelling, or exudate. Tongue normal, non-obstructing. Tonsils not swollen or erythematous. Hearing normal.  Neck: Supple, thyroid normal. No bruits, nodes or JVD. Respiratory: Respiratory effort normal.  BS equal and clear bilateral without rales, rhonci, wheezing or stridor. Cardio: Heart sounds are normal with  regular rate and rhythm and no murmurs, rubs or gallops. Peripheral pulses are normal and equal bilaterally without edema. No aortic or femoral bruits. Chest: symmetric with normal excursions and percussion.  Abdomen: Soft, with Nl bowel sounds. Nontender, no guarding, rebound, hernias, masses, or organomegaly.  Lymphatics: Non tender without lymphadenopathy.  Genitourinary: No hernias. DRE - prostate nl for age - smooth & firm w/o nodules. Musculoskeletal: Full ROM all peripheral extremities, joint stability, 5/5 strength, and normal gait. Skin: Warm and dry without rashes, lesions, cyanosis, clubbing or  ecchymosis.  Neuro: Cranial nerves intact, reflexes equal bilaterally. Normal muscle tone, no cerebellar symptoms. Sensation intact.  Pysch: Alert and oriented X 3 with normal affect, insight and judgment appropriate.   Assessment and Plan  1. Annual Preventative/Screening Exam   2. Essential hypertension  - BASIC METABOLIC PANEL WITH  GFR - EKG 12-Lead - [place], RETROPERITNL ABD,  LTD - TSH  3. Hyperlipidemia, mixed  - Hepatic function panel - Lipid panel - EKG 12-Lead - [place], RETROPERITNL ABD,  LTD  4. Prediabetes  5. Vitamin D deficiency  6. Hypothyroidism  - TSH  7. Screening for ischemic heart disease  - EKG 12-Lead  8. Screening for AAA (aortic abdominal aneurysm)  - [place], RETROPERITNL ABD,  LTD  9. Family history of ischemic heart disease  - EKG 12-Lead - [place], RETROPERITNL ABD,  LTD  10. Fatigue  - TSH  11. Medication management  - Hepatic function panel - Lipid panel - BASIC METABOLIC PANEL WITH GFR - TSH         Patient was counseled in prudent diet, weight control to achieve/maintain BMI less than 25, BP monitoring, regular exercise and medications as discussed.  Discussed med effects and SE's. Routine screening labs and tests as allowed by his Insurance co. Over 40 minutes of exam, counseling, chart review and high complex critical decision making was performed

## 2017-05-20 NOTE — Patient Instructions (Addendum)

## 2017-06-26 ENCOUNTER — Other Ambulatory Visit: Payer: Self-pay | Admitting: *Deleted

## 2017-06-26 MED ORDER — ATORVASTATIN CALCIUM 40 MG PO TABS: 40.0000 mg | ORAL_TABLET | Freq: Every day | ORAL | 1 refills | Status: DC

## 2017-06-30 ENCOUNTER — Other Ambulatory Visit: Payer: Self-pay | Admitting: *Deleted

## 2017-06-30 MED ORDER — ATORVASTATIN CALCIUM 40 MG PO TABS: 40.0000 mg | ORAL_TABLET | Freq: Every day | ORAL | 1 refills | Status: DC

## 2017-11-18 ENCOUNTER — Ambulatory Visit: Payer: Self-pay | Admitting: Adult Health

## 2018-01-05 ENCOUNTER — Other Ambulatory Visit: Payer: Self-pay | Admitting: Internal Medicine

## 2018-01-05 ENCOUNTER — Other Ambulatory Visit: Payer: Self-pay | Admitting: Physician Assistant

## 2018-06-03 ENCOUNTER — Encounter: Payer: Self-pay | Admitting: *Deleted

## 2018-06-04 ENCOUNTER — Ambulatory Visit (INDEPENDENT_AMBULATORY_CARE_PROVIDER_SITE_OTHER): Payer: BLUE CROSS/BLUE SHIELD | Admitting: Internal Medicine

## 2018-06-04 ENCOUNTER — Other Ambulatory Visit: Payer: Self-pay | Admitting: *Deleted

## 2018-06-04 ENCOUNTER — Encounter: Payer: Self-pay | Admitting: Internal Medicine

## 2018-06-04 VITALS — BP 146/92 | HR 76 | Temp 97.1°F | Resp 16 | Ht 69.0 in | Wt 162.4 lb

## 2018-06-04 DIAGNOSIS — Z79899 Other long term (current) drug therapy: Secondary | ICD-10-CM | POA: Diagnosis not present

## 2018-06-04 DIAGNOSIS — Z13220 Encounter for screening for lipoid disorders: Secondary | ICD-10-CM | POA: Diagnosis not present

## 2018-06-04 DIAGNOSIS — Z1389 Encounter for screening for other disorder: Secondary | ICD-10-CM | POA: Diagnosis not present

## 2018-06-04 DIAGNOSIS — Z0001 Encounter for general adult medical examination with abnormal findings: Secondary | ICD-10-CM

## 2018-06-04 DIAGNOSIS — Z111 Encounter for screening for respiratory tuberculosis: Secondary | ICD-10-CM | POA: Diagnosis not present

## 2018-06-04 DIAGNOSIS — R350 Frequency of micturition: Secondary | ICD-10-CM | POA: Diagnosis not present

## 2018-06-04 DIAGNOSIS — R7309 Other abnormal glucose: Secondary | ICD-10-CM

## 2018-06-04 DIAGNOSIS — Z1211 Encounter for screening for malignant neoplasm of colon: Secondary | ICD-10-CM

## 2018-06-04 DIAGNOSIS — Z136 Encounter for screening for cardiovascular disorders: Secondary | ICD-10-CM | POA: Diagnosis not present

## 2018-06-04 DIAGNOSIS — Z1329 Encounter for screening for other suspected endocrine disorder: Secondary | ICD-10-CM | POA: Diagnosis not present

## 2018-06-04 DIAGNOSIS — Q2381 Bicuspid aortic valve: Secondary | ICD-10-CM

## 2018-06-04 DIAGNOSIS — Z0000 Encounter for general adult medical examination without abnormal findings: Secondary | ICD-10-CM

## 2018-06-04 DIAGNOSIS — Z125 Encounter for screening for malignant neoplasm of prostate: Secondary | ICD-10-CM | POA: Diagnosis not present

## 2018-06-04 DIAGNOSIS — Q231 Congenital insufficiency of aortic valve: Secondary | ICD-10-CM

## 2018-06-04 DIAGNOSIS — Z131 Encounter for screening for diabetes mellitus: Secondary | ICD-10-CM

## 2018-06-04 DIAGNOSIS — Z130 Encounter for screening for diseases of the blood and blood-forming organs and certain disorders involving the immune mechanism: Secondary | ICD-10-CM

## 2018-06-04 DIAGNOSIS — N401 Enlarged prostate with lower urinary tract symptoms: Secondary | ICD-10-CM | POA: Diagnosis not present

## 2018-06-04 DIAGNOSIS — R5383 Other fatigue: Secondary | ICD-10-CM

## 2018-06-04 DIAGNOSIS — E039 Hypothyroidism, unspecified: Secondary | ICD-10-CM

## 2018-06-04 DIAGNOSIS — E559 Vitamin D deficiency, unspecified: Secondary | ICD-10-CM | POA: Diagnosis not present

## 2018-06-04 DIAGNOSIS — K219 Gastro-esophageal reflux disease without esophagitis: Secondary | ICD-10-CM

## 2018-06-04 DIAGNOSIS — Z8249 Family history of ischemic heart disease and other diseases of the circulatory system: Secondary | ICD-10-CM

## 2018-06-04 DIAGNOSIS — I10 Essential (primary) hypertension: Secondary | ICD-10-CM | POA: Diagnosis not present

## 2018-06-04 DIAGNOSIS — Z1212 Encounter for screening for malignant neoplasm of rectum: Secondary | ICD-10-CM

## 2018-06-04 DIAGNOSIS — E782 Mixed hyperlipidemia: Secondary | ICD-10-CM

## 2018-06-04 MED ORDER — ENALAPRIL MALEATE 20 MG PO TABS: 20.0000 mg | ORAL_TABLET | Freq: Every day | ORAL | 0 refills | Status: DC

## 2018-06-04 MED ORDER — ATORVASTATIN CALCIUM 40 MG PO TABS: ORAL_TABLET | ORAL | 1 refills | Status: DC

## 2018-06-04 MED ORDER — ENALAPRIL MALEATE 20 MG PO TABS: 20.0000 mg | ORAL_TABLET | Freq: Every day | ORAL | 1 refills | Status: DC

## 2018-06-04 MED ORDER — LEVOTHYROXINE SODIUM 50 MCG PO TABS: 50.0000 ug | ORAL_TABLET | Freq: Every day | ORAL | 0 refills | Status: DC

## 2018-06-04 MED ORDER — LEVOTHYROXINE SODIUM 50 MCG PO TABS: 50.0000 ug | ORAL_TABLET | Freq: Every day | ORAL | 1 refills | Status: DC

## 2018-06-04 MED ORDER — ATORVASTATIN CALCIUM 40 MG PO TABS: ORAL_TABLET | ORAL | 0 refills | Status: DC

## 2018-06-04 NOTE — Patient Instructions (Signed)

## 2018-06-04 NOTE — Progress Notes (Signed)
[facility]   Unk Pinto, M.D.     Uvaldo Bristle. Silverio Lay, P.A.-C Liane Comber, Linda                [address] [place] [address] Telephone [phone] Telefax [phone] Annual  Screening/Preventative Visit  & Comprehensive Evaluation & Examination     This very nice 58 y.o. MWM presents for a Screening /Preventative Visit & comprehensive evaluation and management of multiple medical co-morbidities.  Patient has been followed for labile  HTN, HLD, Hypothyroidism, screening for Prediabetes and Vitamin D Deficiency. Patient has GERD controlled oin his OTC meds. Patient has not been seen in the last year as recommended as he had cancelled & failed to reschedule appointments.      HTN predates circa 1999. Patient's BP has been controlled at home.  Today's BP is not at goal at 146/92. Patient has hx/o Bicuspid AoV by Echo since 1988 by 2 D echocardiogram.  Patient denies any cardiac symptoms as chest pain, palpitations, shortness of breath, dizziness or ankle swelling.     Patient's hyperlipidemia is controlled with diet and medications. Patient denies myalgias or other medication SE's. Current lipids are at goal: Lab Results  Component Value Date   CHOL 134 05/20/2017   HDL 52 05/20/2017   LDLCALC 65 05/20/2017   TRIG 83 05/20/2017   CHOLHDL 2.6 05/20/2017      Patient is monitored expectantly for glucose intolerance and patient denies reactive hypoglycemic symptoms, visual blurring, diabetic polys or paresthesias. Last A1c was Normal & at goal: Lab Results  Component Value Date   HGBA1C 5.0 04/18/2016       Patient was initated on Thyroid replacement in 2011.      Finally, patient has history of Vitamin D Deficiency ("12" / 2008) and last vitamin D was  Lab Results  Component Value Date   VD25OH 80 04/18/2016   Current Outpatient Medications on File Prior to Visit   Medication Sig  . Azelastine-Fluticasone 137-50 MCG/ACT SUSP Place 2 sprays into the nose daily.  . B Complex Vitamins (VITAMIN B COMPLEX PO) Take by mouth daily.  . cetirizine (ZYRTEC) 10 MG tablet Take 10 mg by mouth daily. OTC  . Cholecalciferol (VITAMIN D PO) Take 5,000 Int'l Units by mouth daily.   . famotidine (PEPCID) 20 MG tablet Take 20 mg by mouth daily. OTC   No current facility-administered medications on file prior to visit.    Allergies  Allergen Reactions  . Propofol Shortness Of Breath    CHEST TIGHTNESS  . Zoloft [Sertraline Hcl] Other (See Comments)    HYPERACTIVE   Past Medical History:  Diagnosis Date  . Dental crowns present   . Heart murmur    states need for antibiotics prior to dental procedures; no cardiologist  . History of hepatitis A    age 3  . History of MRSA infection 2015   right great toe  . Hyperlipidemia   . Hypertension    states under control with med., has been on med. > 10 yr.  . Hypothyroidism   . Mass of skin of right shoulder 04/2015  . Neck mass 04/2015   posterior  . Seasonal allergies   . Vitamin D deficiency    Health Maintenance  Topic Date Due  . INFLUENZA VACCINE  11/13/2017  . COLONOSCOPY  10/27/2023  .  TETANUS/TDAP  10/28/2023  . Hepatitis C Screening  Completed  . HIV Screening  Completed   Immunization History  Administered Date(s) Administered  . PPD Test 02/22/2013, 02/24/2014, 03/08/2015, 04/18/2016  . Td 04/15/2005  . Tdap 10/27/2013   Last Colon - 10/26/2013 - Dr Deatra Ina - recc f/u 10 yrs - July 2025  Past Surgical History:  Procedure Laterality Date  . COLONOSCOPY WITH PROPOFOL  10/21/2013  . EYE SURGERY Right 1966  . HYDROCELE EXCISION    . MASS EXCISION Right 04/26/2015   Procedure: EXCISION MASS NECK AND RIGHT SHOULDER ;  Surgeon: Johnathan Hausen, MD;  Location: [facility];  Service: General;  Laterality: Right;   Family History  Problem Relation Age of Onset  . Arthritis Mother   .  Depression Mother   . Migraines Mother   . Cancer Father        prostate  . Arthritis Father   . Ulcers Father   . Heart attack Father   . Colon cancer Maternal Uncle 75   Social History   Socioeconomic History  . Marital status: Married    Spouse name: Jacqueine  . Number of children: None  Occupational History  . Wellsite geologist  Tobacco Use  . Smoking status: Never Smoker  . Smokeless tobacco: Never Used  Substance and Sexual Activity  . Alcohol use: Yes    Comment: socially  . Drug use: No  . Sexual activity: Not on file    ROS Constitutional: Denies fever, chills, weight loss/gain, headaches, insomnia,  night sweats or change in appetite. Does c/o fatigue. Eyes: Denies redness, blurred vision, diplopia, discharge, itchy or watery eyes.  ENT: Denies discharge, congestion, post nasal drip, epistaxis, sore throat, earache, hearing loss, dental pain, Tinnitus, Vertigo, Sinus pain or snoring.  Cardio: Denies chest pain, palpitations, irregular heartbeat, syncope, dyspnea, diaphoresis, orthopnea, PND, claudication or edema Respiratory: denies cough, dyspnea, DOE, pleurisy, hoarseness, laryngitis or wheezing.  Gastrointestinal: Denies dysphagia, heartburn, reflux, water brash, pain, cramps, nausea, vomiting, bloating, diarrhea, constipation, hematemesis, melena, hematochezia, jaundice or hemorrhoids Genitourinary: Denies dysuria, frequency, urgency, nocturia, hesitancy, discharge, hematuria or flank pain Musculoskeletal: Denies arthralgia, myalgia, stiffness, Jt. Swelling, pain, limp or strain/sprain. Denies Falls. Skin: Denies puritis, rash, hives, warts, acne, eczema or change in skin lesion Neuro: No weakness, tremor, incoordination, spasms, paresthesia or pain Psychiatric: Denies confusion, memory loss or sensory loss. Denies Depression. Endocrine: Denies change in weight, skin, hair change, nocturia, and paresthesia, diabetic polys, visual blurring or hyper / hypo  glycemic episodes.  Heme/Lymph: No excessive bleeding, bruising or enlarged lymph nodes.  Physical Exam  BP (!) 146/92   Pulse 76   Temp (!) 97.1 F (36.2 C)   Resp 16   Ht 5\' 9"  (1.753 m)   Wt 162 lb 6.4 oz (73.7 kg)   BMI 23.98 kg/m   General Appearance: Well nourished and well groomed and in no apparent distress.  Eyes: PERRLA, EOMs, conjunctiva no swelling or erythema, normal fundi and vessels. Sinuses: No frontal/maxillary tenderness ENT/Mouth: EACs patent / TMs  nl. Nares clear without erythema, swelling, mucoid exudates. Oral hygiene is good. No erythema, swelling, or exudate. Tongue normal, non-obstructing. Tonsils not swollen or erythematous. Hearing normal.  Neck: Supple, thyroid not palpable. No bruits, nodes or JVD. Respiratory: Respiratory effort normal.  BS equal and clear bilateral without rales, rhonci, wheezing or stridor. Cardio: Heart sounds are normal with regular rate and rhythm and a soft Gr 1-2/4 blowing AoSys M max at the  2sd Rt  ICS. Peripheral pulses are normal and equal bilaterally without edema. No aortic or femoral bruits. Chest: symmetric with normal excursions and percussion.  Abdomen: Soft, with Nl bowel sounds. Nontender, no guarding, rebound, hernias, masses, or organomegaly.  Lymphatics: Non tender without lymphadenopathy.  Genitourinary: No hernias.Testes nl. DRE - prostate nl for age - smooth & firm w/o nodules. Musculoskeletal: Full ROM all peripheral extremities, joint stability, 5/5 strength, and normal gait. Skin: Warm and dry without rashes, lesions, cyanosis, clubbing or  ecchymosis.  Neuro: Cranial nerves intact, reflexes equal bilaterally. Normal muscle tone, no cerebellar symptoms. Sensation intact.  Pysch: Alert and oriented X 3 with normal affect, insight and judgment appropriate.   Assessment and Plan  1. Annual Preventative/Screening Exam   2. Essential hypertension  - EKG 12-Lead - [place], RETROPERITNL ABD,  LTD - Urinalysis,  Routine w reflex microscopic - Microalbumin / creatinine urine ratio - CBC with Differential/Platelet - COMPLETE METABOLIC PANEL WITH GFR - Magnesium - TSH  3. Hyperlipidemia, mixed  - EKG 12-Lead - [place], RETROPERITNL ABD,  LTD - Lipid panel - TSH  4. Abnormal glucose  - EKG 12-Lead - [place], RETROPERITNL ABD,  LTD - Hemoglobin A1c - Insulin, random  5. Vitamin D deficiency  - VITAMIN D 25 Hydroxy  6. Bicuspid aortic valve  - discussed with patient need for f/u echocardiograms  - Ambulatory referral to Cardiology  7. Hypothyroidism  - TSH  8. Gastroesophageal reflux disease  - CBC with Differential/Platelet  9. Screening for colorectal cancer  - POC Hemoccult Bld/Stl  10. Prostate cancer screening  - PSA  11. Screening-pulmonary TB  - TB Skin Test  12. Screening for ischemic heart disease  - EKG 12-Lead  13. FHx: heart disease  - EKG 12-Lead - [place], RETROPERITNL ABD,  LTD  14. Screening for AAA (aortic abdominal aneurysm)  - [place], RETROPERITNL ABD,  LTD  15. Fatigue, unspecified type  - Iron,Total/Total Iron Binding Cap - Vitamin B12  16. Medication management  - Urinalysis, Routine w reflex microscopic - Microalbumin / creatinine urine ratio - CBC with Differential/Platelet - COMPLETE METABOLIC PANEL WITH GFR - Magnesium - Lipid panel - TSH - Hemoglobin A1c - Insulin, random - VITAMIN D 25 Hydroxy       Patient was counseled in prudent diet, weight control to achieve/maintain BMI less than 25, BP monitoring, regular exercise and medications as discussed.  Discussed med effects and SE's. Routine screening labs and tests as requested with regular follow-up as recommended. Over 40 minutes of exam, counseling, chart review and high complex critical decision making was performed

## 2018-06-05 LAB — COMPLETE METABOLIC PANEL WITH GFR
AG Ratio: 1.9 (calc) (ref 1.0–2.5)
ALT: 44 U/L (ref 9–46)
AST: 27 U/L (ref 10–35)
Albumin: 4.7 g/dL (ref 3.6–5.1)
Alkaline phosphatase (APISO): 82 U/L (ref 35–144)
BUN: 14 mg/dL (ref 7–25)
CO2: 28 mmol/L (ref 20–32)
Calcium: 9.9 mg/dL (ref 8.6–10.3)
Chloride: 105 mmol/L (ref 98–110)
Creat: 1 mg/dL (ref 0.70–1.33)
GFR, Est African American: 96 mL/min/{1.73_m2} (ref >=60)
GFR, Est Non African American: 83 mL/min/{1.73_m2} (ref >=60)
Globulin: 2.5 g/dL (calc) (ref 1.9–3.7)
Glucose, Bld: 77 mg/dL (ref 65–99)
Potassium: 4.2 mmol/L (ref 3.5–5.3)
Sodium: 141 mmol/L (ref 135–146)
Total Bilirubin: 0.6 mg/dL (ref 0.2–1.2)
Total Protein: 7.2 g/dL (ref 6.1–8.1)

## 2018-06-05 LAB — URINALYSIS, ROUTINE W REFLEX MICROSCOPIC
Bilirubin Urine: NEGATIVE
Glucose, UA: NEGATIVE
Hgb urine dipstick: NEGATIVE
Ketones, ur: NEGATIVE
Leukocytes,Ua: NEGATIVE
Nitrite: NEGATIVE
Protein, ur: NEGATIVE
Specific Gravity, Urine: 1.016 (ref 1.001–1.03)
pH: 6.5 (ref 5.0–8.0)

## 2018-06-05 LAB — MICROALBUMIN / CREATININE URINE RATIO
Creatinine, Urine: 97 mg/dL (ref 20–320)
Microalb Creat Ratio: 2 mcg/mg creat (ref <30)
Microalb, Ur: 0.2 mg/dL

## 2018-06-05 LAB — LIPID PANEL
Cholesterol: 121 mg/dL (ref <200)
HDL: 48 mg/dL (ref >=40)
LDL Cholesterol (Calc): 55 mg/dL (calc)
Non-HDL Cholesterol (Calc): 73 mg/dL (calc) (ref <130)
Total CHOL/HDL Ratio: 2.5 (calc) (ref <5.0)
Triglycerides: 99 mg/dL (ref <150)

## 2018-06-05 LAB — CBC WITH DIFFERENTIAL/PLATELET
Absolute Monocytes: 449 cells/uL (ref 200–950)
Basophils Absolute: 39 cells/uL (ref 0–200)
Basophils Relative: 0.6 %
Eosinophils Absolute: 228 cells/uL (ref 15–500)
Eosinophils Relative: 3.5 %
HCT: 48.3 % (ref 38.5–50.0)
Hemoglobin: 16.5 g/dL (ref 13.2–17.1)
Lymphs Abs: 1547 cells/uL (ref 850–3900)
MCH: 30.7 pg (ref 27.0–33.0)
MCHC: 34.2 g/dL (ref 32.0–36.0)
MCV: 89.8 fL (ref 80.0–100.0)
MPV: 9.5 fL (ref 7.5–12.5)
Monocytes Relative: 6.9 %
Neutro Abs: 4238 cells/uL (ref 1500–7800)
Neutrophils Relative %: 65.2 %
Platelets: 252 10*3/uL (ref 140–400)
RBC: 5.38 10*6/uL (ref 4.20–5.80)
RDW: 12.8 % (ref 11.0–15.0)
Total Lymphocyte: 23.8 %
WBC: 6.5 10*3/uL (ref 3.8–10.8)

## 2018-06-05 LAB — PSA: PSA: 0.9 ng/mL (ref <=4.0)

## 2018-06-05 LAB — VITAMIN D 25 HYDROXY (VIT D DEFICIENCY, FRACTURES): Vit D, 25-Hydroxy: 68 ng/mL (ref 30–100)

## 2018-06-05 LAB — TSH: TSH: 3.73 mIU/L (ref 0.40–4.50)

## 2018-06-05 LAB — IRON, TOTAL/TOTAL IRON BINDING CAP
%SAT: 34 % (calc) (ref 20–48)
Iron: 107 ug/dL (ref 50–180)
TIBC: 315 mcg/dL (calc) (ref 250–425)

## 2018-06-05 LAB — HEMOGLOBIN A1C
Hgb A1c MFr Bld: 5.1 % of total Hgb (ref <5.7)
Mean Plasma Glucose: 100 (calc)
eAG (mmol/L): 5.5 (calc)

## 2018-06-05 LAB — INSULIN, RANDOM: Insulin: 4.1 u[IU]/mL

## 2018-06-05 LAB — VITAMIN B12: Vitamin B-12: 396 pg/mL (ref 200–1100)

## 2018-06-05 LAB — MAGNESIUM: Magnesium: 2.1 mg/dL (ref 1.5–2.5)

## 2018-09-08 ENCOUNTER — Ambulatory Visit: Payer: Self-pay | Admitting: Adult Health

## 2018-09-14 ENCOUNTER — Ambulatory Visit: Payer: BLUE CROSS/BLUE SHIELD | Admitting: Internal Medicine

## 2018-12-14 ENCOUNTER — Ambulatory Visit: Payer: Self-pay | Admitting: Internal Medicine

## 2019-04-16 DIAGNOSIS — I350 Nonrheumatic aortic (valve) stenosis: Secondary | ICD-10-CM

## 2019-04-16 DIAGNOSIS — I714 Abdominal aortic aneurysm, without rupture, unspecified: Secondary | ICD-10-CM

## 2019-04-16 HISTORY — DX: Abdominal aortic aneurysm, without rupture, unspecified: I71.40

## 2019-04-16 HISTORY — DX: Nonrheumatic aortic (valve) stenosis: I35.0

## 2019-05-23 ENCOUNTER — Other Ambulatory Visit: Payer: Self-pay | Admitting: Internal Medicine

## 2019-05-24 ENCOUNTER — Other Ambulatory Visit: Payer: Self-pay | Admitting: Internal Medicine

## 2019-05-24 MED ORDER — AMOXICILLIN 500 MG PO CAPS: ORAL_CAPSULE | ORAL | 0 refills | Status: DC

## 2019-06-23 ENCOUNTER — Encounter: Payer: Self-pay | Admitting: Internal Medicine

## 2019-06-23 NOTE — Patient Instructions (Signed)
Vit D  & Vit C 1,000 mg   are recommended to help protect  against the Covid-19 and other Corona viruses.    Also it's recommended  to take  Zinc 50 mg  to help  protect against the Covid-19   and [place] place to get  is also on [employer]  and don't pay more than 6-8 cents /pill !  =============================== Coronavirus (COVID-19) Are you at risk?  Are you at risk for the Coronavirus (COVID-19)?  To be considered HIGH RISK for Coronavirus (COVID-19), you have to meet the following criteria:  . Traveled to [place], [place], [place], [place] or [place]; or in the [place] to [place], [place], [place]  . or [place]; and have fever, cough, and shortness of breath within the last 2 weeks of travel OR . Been in close contact with a person diagnosed with COVID-19 within the last 2 weeks and have  . fever, cough,and shortness of breath .  . IF YOU DO NOT MEET THESE CRITERIA, YOU ARE CONSIDERED LOW RISK FOR COVID-19.  What to do if you are HIGH RISK for COVID-19?  . If you are having a medical emergency, call 911. . Seek medical care right away. Before you go to a doctor's office, urgent care or emergency department, .  call ahead and tell them about your recent travel, contact with someone diagnosed with COVID-19  .  and your symptoms.  . You should receive instructions from your physician's office regarding next steps of care.  . When you arrive at healthcare provider, tell the healthcare staff immediately you have returned from  . visiting [place], [place], [place], [place] or [place]; or traveled in the [place] to [place], [place],  . [place] or [place] in the last two weeks or you have been in close contact with a person diagnosed with  . COVID-19 in the last 2 weeks.   . Tell the health care staff about your symptoms: fever, cough and shortness of breath. . After you have been seen by a medical provider, you will be either: o Tested for (COVID-19) and  discharged home on quarantine except to seek medical care if  o symptoms worsen, and asked to  - Stay home and avoid contact with others until you get your results (4-5 days)  - Avoid travel on public transportation if possible (such as bus, train, or airplane) or o Sent to the Emergency Department by EMS for evaluation, COVID-19 testing  and  o possible admission depending on your condition and test results.  What to do if you are LOW RISK for COVID-19?  Reduce your risk of any infection by using the same precautions used for avoiding the common cold or flu:  . Wash your hands often with soap and warm water for at least 20 seconds.  If soap and water are not readily available,  . use an alcohol-based hand sanitizer with at least 60% alcohol.  . If coughing or sneezing, cover your mouth and nose by coughing or sneezing into the elbow areas of your shirt or coat, .  into a tissue or into your sleeve (not your hands). . Avoid shaking hands with others and consider head nods or verbal greetings only. . Avoid touching your eyes, nose, or mouth with unwashed hands.  . Avoid close contact with people who are sick. . Avoid places or events with large numbers of people in one location, like concerts or sporting events. .   Carefully consider travel plans you have or are making. . If you are planning any travel outside or inside the [place], visit the CDC's Travelers' Health webpage for the latest health notices. . If you have some symptoms but not all symptoms, continue to monitor at home and seek medical attention  . if your symptoms worsen. . If you are having a medical emergency, call 911. >>>>>>>>>>>>>>>>>>>>>>>>>>>> Preventive Care for Adults  A healthy lifestyle and preventive care can promote health and wellness. Preventive health guidelines for men include the following key practices:  A routine yearly physical is a good way to check with your health care provider about your health and  preventative screening. It is a chance to share any concerns and updates on your health and to receive a thorough exam.  Visit your dentist for a routine exam and preventative care every 6 months. Brush your teeth twice a day and floss once a day. Good oral hygiene prevents tooth decay and gum disease.  The frequency of eye exams is based on your age, health, family medical history, use of contact lenses, and other factors. Follow your health care provider's recommendations for frequency of eye exams.  Eat a healthy diet. Foods such as vegetables, fruits, whole grains, low-fat dairy products, and lean protein foods contain the nutrients you need without too many calories. Decrease your intake of foods high in solid fats, added sugars, and salt. Eat the right amount of calories for you. Get information about a proper diet from your health care provider, if necessary.  Regular physical exercise is one of the most important things you can do for your health. Most adults should get at least 150 minutes of moderate-intensity exercise (any activity that increases your heart rate and causes you to sweat) each week. In addition, most adults need muscle-strengthening exercises on 2 or more days a week.  Maintain a healthy weight. The body mass index (BMI) is a screening tool to identify possible weight problems. It provides an estimate of body fat based on height and weight. Your health care provider can find your BMI and can help you achieve or maintain a healthy weight. For adults 20 years and older:  A BMI below 18.5 is considered underweight.  A BMI of 18.5 to 24.9 is normal.  A BMI of 25 to 29.9 is considered overweight.  A BMI of 30 and above is considered obese.  Maintain normal blood lipids and cholesterol levels by exercising and minimizing your intake of saturated fat. Eat a balanced diet with plenty of fruit and vegetables. Blood tests for lipids and cholesterol should begin at age 20 and be  repeated every 5 years. If your lipid or cholesterol levels are high, you are over 50, or you are at high risk for heart disease, you may need your cholesterol levels checked more frequently. Ongoing high lipid and cholesterol levels should be treated with medicines if diet and exercise are not working.  If you smoke, find out from your health care provider how to quit. If you do not use tobacco, do not start.  Lung cancer screening is recommended for adults aged 55-80 years who are at high risk for developing lung cancer because of a history of smoking. A yearly low-dose CT scan of the lungs is recommended for people who have at least a 30-pack-year history of smoking and are a current smoker or have quit within the past 15 years. A pack year of smoking is smoking an average of 1   pack of cigarettes a day for 1 year (for example: 1 pack a day for 30 years or 2 packs a day for 15 years). Yearly screening should continue until the smoker has stopped smoking for at least 15 years. Yearly screening should be stopped for people who develop a health problem that would prevent them from having lung cancer treatment.  If you choose to drink alcohol, do not have more than 2 drinks per day. One drink is considered to be 12 ounces (355 mL) of beer, 5 ounces (148 mL) of wine, or 1.5 ounces (44 mL) of liquor.  Avoid use of street drugs. Do not share needles with anyone. Ask for help if you need support or instructions about stopping the use of drugs.  High blood pressure causes heart disease and increases the risk of stroke. Your blood pressure should be checked at least every 1-2 years. Ongoing high blood pressure should be treated with medicines, if weight loss and exercise are not effective.  If you are 45-79 years old, ask your health care provider if you should take aspirin to prevent heart disease.  Diabetes screening involves taking a blood sample to check your fasting blood sugar level. This should be done  once every 3 years, after age 45, if you are within normal weight and without risk factors for diabetes. Testing should be considered at a younger age or be carried out more frequently if you are overweight and have at least 1 risk factor for diabetes.  Colorectal cancer can be detected and often prevented. Most routine colorectal cancer screening begins at the age of 50 and continues through age 75. However, your health care provider may recommend screening at an earlier age if you have risk factors for colon cancer. On a yearly basis, your health care provider may provide home test kits to check for hidden blood in the stool. Use of a small camera at the end of a tube to directly examine the colon (sigmoidoscopy or colonoscopy) can detect the earliest forms of colorectal cancer. Talk to your health care provider about this at age 50, when routine screening begins. Direct exam of the colon should be repeated every 5-10 years through age 75, unless early forms of precancerous polyps or small growths are found.   Talk with your health care provider about prostate cancer screening.  Testicular cancer screening isrecommended for adult males. Screening includes self-exam, a health care provider exam, and other screening tests. Consult with your health care provider about any symptoms you have or any concerns you have about testicular cancer.  Use sunscreen. Apply sunscreen liberally and repeatedly throughout the day. You should seek shade when your shadow is shorter than you. Protect yourself by wearing long sleeves, pants, a wide-brimmed hat, and sunglasses year round, whenever you are outdoors.  Once a month, do a whole-body skin exam, using a mirror to look at the skin on your back. Tell your health care provider about new moles, moles that have irregular borders, moles that are larger than a pencil eraser, or moles that have changed in shape or color.  Stay current with required vaccines  (immunizations).  Influenza vaccine. All adults should be immunized every year.  Tetanus, diphtheria, and acellular pertussis (Td, Tdap) vaccine. An adult who has not previously received Tdap or who does not know his vaccine status should receive 1 dose of Tdap. This initial dose should be followed by tetanus and diphtheria toxoids (Td) booster doses every 10 years. Adults with an   unknown or incomplete history of completing a 3-dose immunization series with Td-containing vaccines should begin or complete a primary immunization series including a Tdap dose. Adults should receive a Td booster every 10 years.  Varicella vaccine. An adult without evidence of immunity to varicella should receive 2 doses or a second dose if he has previously received 1 dose.  Human papillomavirus (HPV) vaccine. Males aged 13-21 years who have not received the vaccine previously should receive the 3-dose series. Males aged 22-26 years may be immunized. Immunization is recommended through the age of 26 years for any male who has sex with males and did not get any or all doses earlier. Immunization is recommended for any person with an immunocompromised condition through the age of 26 years if he did not get any or all doses earlier. During the 3-dose series, the second dose should be obtained 4-8 weeks after the first dose. The third dose should be obtained 24 weeks after the first dose and 16 weeks after the second dose.  Zoster vaccine. One dose is recommended for adults aged 60 years or older unless certain conditions are present.    PREVNAR  - Pneumococcal 13-valent conjugate (PCV13) vaccine. When indicated, a person who is uncertain of his immunization history and has no record of immunization should receive the PCV13 vaccine. An adult aged 19 years or older who has certain medical conditions and has not been previously immunized should receive 1 dose of PCV13 vaccine. This PCV13 should be followed with a dose of  pneumococcal polysaccharide (PPSV23) vaccine. The PPSV23 vaccine dose should be obtained at least 1 r more year(s) after the dose of PCV13 vaccine. An adult aged 19 years or older who has certain medical conditions and previously received 1 or more doses of PPSV23 vaccine should receive 1 dose of PCV13. The PCV13 vaccine dose should be obtained 1 or more years after the last PPSV23 vaccine dose.    PNEUMOVAX - Pneumococcal polysaccharide (PPSV23) vaccine. When PCV13 is also indicated, PCV13 should be obtained first. All adults aged 65 years and older should be immunized. An adult younger than age 65 years who has certain medical conditions should be immunized. Any person who resides in a nursing home or long-term care facility should be immunized. An adult smoker should be immunized. People with an immunocompromised condition and certain other conditions should receive both PCV13 and PPSV23 vaccines. People with human immunodeficiency virus (HIV) infection should be immunized as soon as possible after diagnosis. Immunization during chemotherapy or radiation therapy should be avoided. Routine use of PPSV23 vaccine is not recommended for American Indians, [place] Natives, or people younger than 65 years unless there are medical conditions that require PPSV23 vaccine. When indicated, people who have unknown immunization and have no record of immunization should receive PPSV23 vaccine. One-time revaccination 5 years after the first dose of PPSV23 is recommended for people aged 19-64 years who have chronic kidney failure, nephrotic syndrome, asplenia, or immunocompromised conditions. People who received 1-2 doses of PPSV23 before age 65 years should receive another dose of PPSV23 vaccine at age 65 years or later if at least 5 years have passed since the previous dose. Doses of PPSV23 are not needed for people immunized with PPSV23 at or after age 65 years.    Hepatitis A vaccine. Adults who wish to be protected  from this disease, have certain high-risk conditions, work with hepatitis A-infected animals, work in hepatitis A research labs, or travel to or work in countries with   a high rate of hepatitis A should be immunized. Adults who were previously unvaccinated and who anticipate close contact with an international adoptee during the first 60 days after arrival in the [place] from a country with a high rate of hepatitis A should be immunized.    Hepatitis B vaccine. Adults should be immunized if they wish to be protected from this disease, have certain high-risk conditions, may be exposed to blood or other infectious body fluids, are household contacts or sex partners of hepatitis B positive people, are clients or workers in certain care facilities, or travel to or work in countries with a high rate of hepatitis B.   Preventive Service / Frequency   Ages 40 to 64  Blood pressure check.  Lipid and cholesterol check  Lung cancer screening. / Every year if you are aged 55-80 years and have a 30-pack-year history of smoking and currently smoke or have quit within the past 15 years. Yearly screening is stopped once you have quit smoking for at least 15 years or develop a health problem that would prevent you from having lung cancer treatment.  Fecal occult blood test (FOBT) of stool. / Every year beginning at age 50 and continuing until age 75. You may not have to do this test if you get a colonoscopy every 10 years.  Flexible sigmoidoscopy** or colonoscopy.** / Every 5 years for a flexible sigmoidoscopy or every 10 years for a colonoscopy beginning at age 50 and continuing until age 75. Screening for abdominal aortic aneurysm (AAA)  by ultrasound is recommended for people who have history of high blood pressure or who are current or former smokers. +++++++++++ Recommend Adult Low Dose Aspirin or  coated  Aspirin 81 mg daily  To reduce risk of Colon Cancer 40 %,  Skin Cancer 26 % ,  Malignant  Melanoma 46%  and  Pancreatic cancer 60% ++++++++++++++++++++ Vitamin D goal  is between 70-100.  Please make sure that you are taking your Vitamin D as directed.  It is very important as a natural anti-inflammatory  helping hair, skin, and nails, as well as reducing stroke and heart attack risk.  It helps your bones and helps with mood. It also decreases numerous cancer risks so please take it as directed.  Low Vit D is associated with a 200-300% higher risk for CANCER  and 200-300% higher risk for HEART   ATTACK  &  STROKE.   ...................................... It is also associated with higher death rate at younger ages,  autoimmune diseases like Rheumatoid arthritis, Lupus, Multiple Sclerosis.    Also many other serious conditions, like depression, Alzheimer's Dementia, infertility, muscle aches, fatigue, fibromyalgia - just to name a few. +++++++++++++++++++++ Recommend the book "The END of DIETING" by Dr Joel Fuhrman  & the book "The END of DIABETES " by Dr Joel Fuhrman At [employer] - get book & Audio CD's    Being diabetic has a  300% increased risk for heart attack, stroke, cancer, and alzheimer- type vascular dementia. It is very important that you work harder with diet by avoiding all foods that are white. Avoid white rice (brown & wild rice is OK), white potatoes (sweetpotatoes in moderation is OK), White bread or wheat bread or anything made out of white flour like bagels, donuts, rolls, buns, biscuits, cakes, pastries, cookies, pizza crust, and pasta (made from white flour & egg whites) - vegetarian pasta or spinach or wheat pasta is OK. Multigrain breads like Arnold's or [employer],   or multigrain sandwich thins or flatbreads.  Diet, exercise and weight loss can reverse and cure diabetes in the early stages.  Diet, exercise and weight loss is very important in the control and prevention of complications of diabetes which affects every system in your body, ie. Brain -  dementia/stroke, eyes - glaucoma/blindness, heart - heart attack/heart failure, kidneys - dialysis, stomach - gastric paralysis, intestines - malabsorption, nerves - severe painful neuritis, circulation - gangrene & loss of a leg(s), and finally cancer and Alzheimers.    I recommend avoid fried & greasy foods,  sweets/candy, white rice (brown or wild rice or Quinoa is OK), white potatoes (sweet potatoes are OK) - anything made from white flour - bagels, doughnuts, rolls, buns, biscuits,white and wheat breads, pizza crust and traditional pasta made of white flour & egg white(vegetarian pasta or spinach or wheat pasta is OK).  Multi-grain bread is OK - like multi-grain flat bread or sandwich thins. Avoid alcohol in excess. Exercise is also important.    Eat all the vegetables you want - avoid meat, especially red meat and dairy - especially cheese.  Cheese is the most concentrated form of trans-fats which is the worst thing to clog up our arteries. Veggie cheese is OK which can be found in the fresh produce section at [employer] or [employer] or [employer]  ++++++++++++++++++++++ DASH Eating Plan  DASH stands for "Dietary Approaches to Stop Hypertension."   The DASH eating plan is a healthy eating plan that has been shown to reduce high blood pressure (hypertension). Additional health benefits may include reducing the risk of type 2 diabetes mellitus, heart disease, and stroke. The DASH eating plan may also help with weight loss. WHAT DO I NEED TO KNOW ABOUT THE DASH EATING PLAN? For the DASH eating plan, you will follow these general guidelines:  Choose foods with a percent daily value for sodium of less than 5% (as listed on the food label).  Use salt-free seasonings or herbs instead of table salt or sea salt.  Check with your health care provider or pharmacist before using salt substitutes.  Eat lower-sodium products, often labeled as "lower sodium" or "no salt added."  Eat fresh  foods.  Eat more vegetables, fruits, and low-fat dairy products.  Choose whole grains. Look for the word "whole" as the first word in the ingredient list.  Choose fish   Limit sweets, desserts, sugars, and sugary drinks.  Choose heart-healthy fats.  Eat veggie cheese   Eat more home-cooked food and less restaurant, buffet, and fast food.  Limit fried foods.  Cook foods using methods other than frying.  Limit canned vegetables. If you do use them, rinse them well to decrease the sodium.  When eating at a restaurant, ask that your food be prepared with less salt, or no salt if possible.                      WHAT FOODS CAN I EAT? Read Dr Joel Fuhrman's books on The End of Dieting & The End of Diabetes  Grains Whole grain or whole wheat bread. Brown rice. Whole grain or whole wheat pasta. Quinoa, bulgur, and whole grain cereals. Low-sodium cereals. Corn or whole wheat flour tortillas. Whole grain cornbread. Whole grain crackers. Low-sodium crackers.  Vegetables Fresh or frozen vegetables (raw, steamed, roasted, or grilled). Low-sodium or reduced-sodium tomato and vegetable juices. Low-sodium or reduced-sodium tomato sauce and paste. Low-sodium or reduced-sodium canned vegetables.   Fruits All fresh, canned (  in natural juice), or frozen fruits.  Protein Products  All fish and seafood.  Dried beans, peas, or lentils. Unsalted nuts and seeds. Unsalted canned beans.  Dairy Low-fat dairy products, such as skim or 1% milk, 2% or reduced-fat cheeses, low-fat ricotta or cottage cheese, or plain low-fat yogurt. Low-sodium or reduced-sodium cheeses.  Fats and Oils Tub margarines without trans fats. Light or reduced-fat mayonnaise and salad dressings (reduced sodium). Avocado. Safflower, olive, or canola oils. Natural peanut or almond butter.  Other Unsalted popcorn and pretzels. The items listed above may not be a complete list of recommended foods or beverages. Contact your  dietitian for more options.  +++++++++++++++++++  WHAT FOODS ARE NOT RECOMMENDED? Grains/ White flour or wheat flour White bread. White pasta. White rice. Refined cornbread. Bagels and croissants. Crackers that contain trans fat.  Vegetables  Creamed or fried vegetables. Vegetables in a . Regular canned vegetables. Regular canned tomato sauce and paste. Regular tomato and vegetable juices.  Fruits Dried fruits. Canned fruit in light or heavy syrup. Fruit juice.  Meat and Other Protein Products Meat in general - RED meat & White meat.  Fatty cuts of meat. Ribs, chicken wings, all processed meats as bacon, sausage, bologna, salami, fatback, hot dogs, bratwurst and packaged luncheon meats.  Dairy Whole or 2% milk, cream, half-and-half, and cream cheese. Whole-fat or sweetened yogurt. Full-fat cheeses or blue cheese. Non-dairy creamers and whipped toppings. Processed cheese, cheese spreads, or cheese curds.  Condiments Onion and garlic salt, seasoned salt, table salt, and sea salt. Canned and packaged gravies. Worcestershire sauce. Tartar sauce. Barbecue sauce. Teriyaki sauce. Soy sauce, including reduced sodium. Steak sauce. Fish sauce. Oyster sauce. Cocktail sauce. Horseradish. Ketchup and mustard. Meat flavorings and tenderizers. Bouillon cubes. Hot sauce. Tabasco sauce. Marinades. Taco seasonings. Relishes.  Fats and Oils Butter, stick margarine, lard, shortening and bacon fat. Coconut, palm kernel, or palm oils. Regular salad dressings.  Pickles and olives. Salted popcorn and pretzels.  The items listed above may not be a complete list of foods and beverages to avoid.    

## 2019-06-23 NOTE — Progress Notes (Signed)
Annual  Screening/Preventative Visit  & Comprehensive Evaluation & Examination     This very nice 59 y.o. MWM presents for a Screening /Preventative Visit & comprehensive evaluation and management of multiple medical co-morbidities.  Patient has been followed for labile HTN, HLD, hypothyroidism, Prediabetes screening and Vitamin D Deficiency. Patient has hx/o GERD controlled with diet & OTC meds.      HTN predates since 1999 and patient is on . In Feb 2020, his BP was recorded at 146/92. Today's BP is slightly elevated at 144/84.  In 1988, 2 D cardiac echo showed a Bicuspid Aortic Valve By Dr Aldona Bar).  Patient was referred to Cardiology last year, but he did not follow thru with scheduled app't (reports he deferred due to Covid & would like a re-evaluation).  Patient denies any cardiac symptoms as chest pain, palpitations, shortness of breath, dizziness or ankle swelling.     Patient's hyperlipidemia is controlled with diet and Atorvastatin. Patient has  not followed-up at recommended intervals as requested.  Last lipids were at goal:  Lab Results  Component Value Date   CHOL 121 06/04/2018   HDL 48 06/04/2018   LDLCALC 55 06/04/2018   TRIG 99 06/04/2018   CHOLHDL 2.5 06/04/2018       Patient is monitored proactively for glucose intolerance.  Patient denies reactive hypoglycemic symptoms, visual blurring, diabetic polys or paresthesias. Last A1c was Normal & at goal:  Lab Results  Component Value Date   HGBA1C 5.1 06/04/2018       Patient was dx'd Hypothyroid in 2011 and initiated on replacement.     Finally, patient has history of Vitamin D Deficiency ("12" / 2008)  and last vitamin D was at goal:  Lab Results  Component Value Date   VD25OH 68 06/04/2018    Current Outpatient Medications on File Prior to Visit  Medication Sig   amoxicillin (AMOXIL) 500 MG capsule Take 4 capsules 1 hour before Dental Procedure for Bicuspid Aortic Valve   aspirin EC 81 MG tablet Take 81 mg  by mouth daily.   atorvastatin (LIPITOR) 40 MG tablet Take 1 tablet Daily for Cholesterol   B Complex Vitamins (VITAMIN B COMPLEX PO) Take by mouth daily.   cetirizine (ZYRTEC) 10 MG tablet Take 10 mg by mouth daily. OTC   Cholecalciferol (VITAMIN D PO) Take 5,000 Int'l Units by mouth daily.    enalapril (VASOTEC) 20 MG tablet Take 1 tablet Daily for BP   famotidine (PEPCID) 20 MG tablet Take 20 mg by mouth daily. OTC   levothyroxine (SYNTHROID) 50 MCG tablet Take 1 tablet daily on an empty stomach with only water for 30 minutes & no Antacid meds, Calcium or Magnesium for 4 hours & avoid Biotin   OVER THE COUNTER MEDICATION OTC Flonase PRN   No current facility-administered medications on file prior to visit.   Allergies  Allergen Reactions   Propofol Shortness Of Breath    CHEST TIGHTNESS   Zoloft [Sertraline Hcl] Other (See Comments)    HYPERACTIVE   Past Medical History:  Diagnosis Date   Dental crowns present    Heart murmur    states need for antibiotics prior to dental procedures; no cardiologist   History of hepatitis A    age 21   History of MRSA infection 2015   right great toe   Hyperlipidemia    Hypertension    states under control with med., has been on med. > 10 yr.   Hypothyroidism  Mass of skin of right shoulder 04/2015   Neck mass 04/2015   posterior   Seasonal allergies    Vitamin D deficiency    Health Maintenance  Topic Date Due   INFLUENZA VACCINE  Never done   COLONOSCOPY  10/27/2023   TETANUS/TDAP  10/28/2023   Hepatitis C Screening  Completed   HIV Screening  Completed   Immunization History  Administered Date(s) Administered   PPD Test 02/22/2013, 02/24/2014, 03/08/2015, 04/18/2016, 06/04/2018, 06/24/2019   Pneumococcal-Unspecified 12/16/2008   Td 04/15/2005   Tdap 10/27/2013   Last Colon - 07.09.2015 - Dr Deatra Ina - Recc 10 year f/u due July 2025  Past Surgical History:  Procedure Laterality Date    COLONOSCOPY WITH PROPOFOL  10/21/2013   EYE SURGERY Right 1966   HYDROCELE EXCISION     MASS EXCISION Right 04/26/2015   Procedure: EXCISION MASS NECK AND RIGHT SHOULDER ;  Surgeon: Johnathan Hausen, MD;  Location: [facility];  Service: General;  Laterality: Right;   Family History  Problem Relation Age of Onset   Arthritis Mother    Depression Mother    Migraines Mother    Cancer Father        prostate   Arthritis Father    Ulcers Father    Heart attack Father    Colon cancer Maternal Uncle 62   Social History   Socioeconomic History   Marital status: Married    Spouse name: Geni Bers   Number of children: None  Occupational History   Psychologist, clinical  Tobacco Use   Smoking status: Never Smoker   Smokeless tobacco: Never Used  Substance and Sexual Activity   Alcohol use: Yes    Comment: socially   Drug use: No   Sexual activity: Not on file   ROS Constitutional: Denies fever, chills, weight loss/gain, headaches, insomnia,  night sweats or change in appetite. Does c/o fatigue. Eyes: Denies redness, blurred vision, diplopia, discharge, itchy or watery eyes.  ENT: Denies discharge, congestion, post nasal drip, epistaxis, sore throat, earache, hearing loss, dental pain, Tinnitus, Vertigo, Sinus pain or snoring.  Cardio: Denies chest pain, palpitations, irregular heartbeat, syncope, dyspnea, diaphoresis, orthopnea, PND, claudication or edema Respiratory: denies cough, dyspnea, DOE, pleurisy, hoarseness, laryngitis or wheezing.  Gastrointestinal: Denies dysphagia, heartburn, reflux, water brash, pain, cramps, nausea, vomiting, bloating, diarrhea, constipation, hematemesis, melena, hematochezia, jaundice or hemorrhoids Genitourinary: Denies dysuria, frequency, urgency, nocturia, hesitancy, discharge, hematuria or flank pain Musculoskeletal: Denies arthralgia, myalgia, stiffness, Jt. Swelling, pain, limp or strain/sprain. Denies Falls. Skin:  Denies puritis, rash, hives, warts, acne, eczema or change in skin lesion Neuro: No weakness, tremor, incoordination, spasms, paresthesia or pain Psychiatric: Denies confusion, memory loss or sensory loss. Denies Depression. Endocrine: Denies change in weight, skin, hair change, nocturia, and paresthesia, diabetic polys, visual blurring or hyper / hypo glycemic episodes.  Heme/Lymph: No excessive bleeding, bruising or enlarged lymph nodes.  Physical Exam  BP (!) 144/84    Pulse 68    Temp (!) 96.7 F (35.9 C)    Resp 16    Ht 5\' 9"  (1.753 m)    Wt 170 lb 3.2 oz (77.2 kg)    BMI 25.13 kg/m   General Appearance: Well nourished and well groomed and in no apparent distress.  Eyes: PERRLA, EOMs, conjunctiva no swelling or erythema, normal fundi and vessels. Sinuses: No frontal/maxillary tenderness ENT/Mouth: EACs patent / TMs  nl. Nares clear without erythema, swelling, mucoid exudates. Oral hygiene is good. No erythema, swelling, or  exudate. Tongue normal, non-obstructing. Tonsils not swollen or erythematous. Hearing normal.  Neck: Supple, thyroid not palpable. No bruits, nodes or JVD. Respiratory: Respiratory effort normal.  BS equal and clear bilateral without rales, rhonci, wheezing or stridor. Cardio: Heart sounds are normal with regular rate and rhythm with a soft short blowing Ao sys M. Peripheral pulses are normal and equal bilaterally without edema. No aortic or femoral bruits. Chest: symmetric with normal excursions and percussion.  Abdomen: Soft, with Nl bowel sounds. Nontender, no guarding, rebound, hernias, masses, or organomegaly.  Lymphatics: Non tender without lymphadenopathy.  Musculoskeletal: Full ROM all peripheral extremities, joint stability, 5/5 strength, and normal gait. Skin: Warm and dry without rashes, lesions, cyanosis, clubbing or  ecchymosis.  Neuro: Cranial nerves intact, reflexes equal bilaterally. Normal muscle tone, no cerebellar symptoms. Sensation intact.    Pysch: Alert and oriented X 3 with normal affect, insight and judgment appropriate.   Assessment and Plan  1. Annual Preventative/Screening Exam   2. Essential hypertension  - EKG 12-Lead - [place], RETROPERITNL ABD,  LTD - CBC with Differential/Platelet - COMPLETE METABOLIC PANEL WITH GFR - Magnesium - TSH  3. Hyperlipidemia, mixed  - EKG 12-Lead - [place], RETROPERITNL ABD,  LTD - Lipid panel - TSH  4. Abnormal glucose  - EKG 12-Lead - [place], RETROPERITNL ABD,  LTD - Hemoglobin A1c - Insulin, random  5. Vitamin D deficiency  - EKG 12-Lead  6. Bicuspid aortic valve  - EKG 12-Lead - Ambulatory referral to Cardiology  7. Hypothyroidism  8. Screening for colorectal cancer  - POC Hemoccult Bld/Stl  9. Prostate cancer screening  - PSA  10. Screening-pulmonary TB  - TB Skin Test  11. Screening for ischemic heart disease  - EKG 12-Lead  12. FHx: heart disease  - [place], RETROPERITNL ABD,  LTD  13. Screening for AAA (aortic abdominal aneurysm)  - [place], RETROPERITNL ABD,  LTD  14. Fatigue  - Urinalysis, Routine w reflex microscopic - Microalbumin / creatinine urine ratio - Iron,Total/Total Iron Binding Cap - Vitamin B12 - Testosterone - CBC with Differential/Platelet - TSH  15. Medication management  - CBC with Differential/Platelet - COMPLETE METABOLIC PANEL WITH GFR - Magnesium - Lipid panel - TSH - Hemoglobin A1c - Insulin, random - VITAMIN D 25 Hydroxy        Patient was counseled in prudent diet, weight control to achieve/maintain BMI less than 25, BP monitoring, regular exercise and medications as discussed.  Discussed med effects and SE's. Routine screening labs and tests as requested with regular follow-up as recommended. Over 40 minutes of exam, counseling, chart review and high complex critical decision making was performed   Kirtland Bouchard, MD

## 2019-06-24 ENCOUNTER — Other Ambulatory Visit: Payer: Self-pay

## 2019-06-24 ENCOUNTER — Ambulatory Visit (INDEPENDENT_AMBULATORY_CARE_PROVIDER_SITE_OTHER): Payer: BC Managed Care – PPO | Admitting: Internal Medicine

## 2019-06-24 VITALS — BP 144/84 | HR 68 | Temp 96.7°F | Resp 16 | Ht 69.0 in | Wt 170.2 lb

## 2019-06-24 DIAGNOSIS — R5383 Other fatigue: Secondary | ICD-10-CM

## 2019-06-24 DIAGNOSIS — Z136 Encounter for screening for cardiovascular disorders: Secondary | ICD-10-CM | POA: Diagnosis not present

## 2019-06-24 DIAGNOSIS — Z1329 Encounter for screening for other suspected endocrine disorder: Secondary | ICD-10-CM | POA: Diagnosis not present

## 2019-06-24 DIAGNOSIS — Z130 Encounter for screening for diseases of the blood and blood-forming organs and certain disorders involving the immune mechanism: Secondary | ICD-10-CM

## 2019-06-24 DIAGNOSIS — N401 Enlarged prostate with lower urinary tract symptoms: Secondary | ICD-10-CM

## 2019-06-24 DIAGNOSIS — Z0000 Encounter for general adult medical examination without abnormal findings: Secondary | ICD-10-CM | POA: Diagnosis not present

## 2019-06-24 DIAGNOSIS — Z8249 Family history of ischemic heart disease and other diseases of the circulatory system: Secondary | ICD-10-CM

## 2019-06-24 DIAGNOSIS — Z111 Encounter for screening for respiratory tuberculosis: Secondary | ICD-10-CM | POA: Diagnosis not present

## 2019-06-24 DIAGNOSIS — I10 Essential (primary) hypertension: Secondary | ICD-10-CM | POA: Diagnosis not present

## 2019-06-24 DIAGNOSIS — E782 Mixed hyperlipidemia: Secondary | ICD-10-CM

## 2019-06-24 DIAGNOSIS — R350 Frequency of micturition: Secondary | ICD-10-CM

## 2019-06-24 DIAGNOSIS — E559 Vitamin D deficiency, unspecified: Secondary | ICD-10-CM | POA: Diagnosis not present

## 2019-06-24 DIAGNOSIS — Z1211 Encounter for screening for malignant neoplasm of colon: Secondary | ICD-10-CM

## 2019-06-24 DIAGNOSIS — Z1389 Encounter for screening for other disorder: Secondary | ICD-10-CM | POA: Diagnosis not present

## 2019-06-24 DIAGNOSIS — Q231 Congenital insufficiency of aortic valve: Secondary | ICD-10-CM

## 2019-06-24 DIAGNOSIS — Z125 Encounter for screening for malignant neoplasm of prostate: Secondary | ICD-10-CM | POA: Diagnosis not present

## 2019-06-24 DIAGNOSIS — Z13220 Encounter for screening for lipoid disorders: Secondary | ICD-10-CM | POA: Diagnosis not present

## 2019-06-24 DIAGNOSIS — Z131 Encounter for screening for diabetes mellitus: Secondary | ICD-10-CM | POA: Diagnosis not present

## 2019-06-24 DIAGNOSIS — Z0001 Encounter for general adult medical examination with abnormal findings: Secondary | ICD-10-CM

## 2019-06-24 DIAGNOSIS — R7309 Other abnormal glucose: Secondary | ICD-10-CM

## 2019-06-24 DIAGNOSIS — Z79899 Other long term (current) drug therapy: Secondary | ICD-10-CM | POA: Diagnosis not present

## 2019-06-24 DIAGNOSIS — E039 Hypothyroidism, unspecified: Secondary | ICD-10-CM

## 2019-06-25 LAB — IRON, TOTAL/TOTAL IRON BINDING CAP
%SAT: 30 % (calc) (ref 20–48)
Iron: 89 ug/dL (ref 50–180)
TIBC: 293 mcg/dL (calc) (ref 250–425)

## 2019-06-25 LAB — VITAMIN B12: Vitamin B-12: 358 pg/mL (ref 200–1100)

## 2019-06-25 LAB — TESTOSTERONE: Testosterone: 348 ng/dL (ref 250–827)

## 2019-06-25 LAB — CBC WITH DIFFERENTIAL/PLATELET
Absolute Monocytes: 410 cells/uL (ref 200–950)
Basophils Absolute: 50 cells/uL (ref 0–200)
Basophils Relative: 0.8 %
Eosinophils Absolute: 277 cells/uL (ref 15–500)
Eosinophils Relative: 4.4 %
HCT: 45 % (ref 38.5–50.0)
Hemoglobin: 15.2 g/dL (ref 13.2–17.1)
Lymphs Abs: 1676 cells/uL (ref 850–3900)
MCH: 30.3 pg (ref 27.0–33.0)
MCHC: 33.8 g/dL (ref 32.0–36.0)
MCV: 89.8 fL (ref 80.0–100.0)
MPV: 9.4 fL (ref 7.5–12.5)
Monocytes Relative: 6.5 %
Neutro Abs: 3887 cells/uL (ref 1500–7800)
Neutrophils Relative %: 61.7 %
Platelets: 230 10*3/uL (ref 140–400)
RBC: 5.01 10*6/uL (ref 4.20–5.80)
RDW: 13.1 % (ref 11.0–15.0)
Total Lymphocyte: 26.6 %
WBC: 6.3 10*3/uL (ref 3.8–10.8)

## 2019-06-25 LAB — URINALYSIS, ROUTINE W REFLEX MICROSCOPIC
Bilirubin Urine: NEGATIVE
Glucose, UA: NEGATIVE
Hgb urine dipstick: NEGATIVE
Ketones, ur: NEGATIVE
Leukocytes,Ua: NEGATIVE
Nitrite: NEGATIVE
Protein, ur: NEGATIVE
Specific Gravity, Urine: 1.01 (ref 1.001–1.03)
pH: 6.5 (ref 5.0–8.0)

## 2019-06-25 LAB — COMPLETE METABOLIC PANEL WITH GFR
AG Ratio: 2 (calc) (ref 1.0–2.5)
ALT: 41 U/L (ref 9–46)
AST: 24 U/L (ref 10–35)
Albumin: 4.4 g/dL (ref 3.6–5.1)
Alkaline phosphatase (APISO): 75 U/L (ref 35–144)
BUN: 19 mg/dL (ref 7–25)
CO2: 31 mmol/L (ref 20–32)
Calcium: 10.1 mg/dL (ref 8.6–10.3)
Chloride: 106 mmol/L (ref 98–110)
Creat: 1.05 mg/dL (ref 0.70–1.33)
GFR, Est African American: 90 mL/min/{1.73_m2} (ref >=60)
GFR, Est Non African American: 78 mL/min/{1.73_m2} (ref >=60)
Globulin: 2.2 g/dL (calc) (ref 1.9–3.7)
Glucose, Bld: 86 mg/dL (ref 65–99)
Potassium: 5 mmol/L (ref 3.5–5.3)
Sodium: 142 mmol/L (ref 135–146)
Total Bilirubin: 0.6 mg/dL (ref 0.2–1.2)
Total Protein: 6.6 g/dL (ref 6.1–8.1)

## 2019-06-25 LAB — MICROALBUMIN / CREATININE URINE RATIO
Creatinine, Urine: 39 mg/dL (ref 20–320)
Microalb, Ur: <0.2 mg/dL

## 2019-06-25 LAB — LIPID PANEL
Cholesterol: 118 mg/dL (ref <200)
HDL: 46 mg/dL (ref >=40)
LDL Cholesterol (Calc): 53 mg/dL (calc)
Non-HDL Cholesterol (Calc): 72 mg/dL (calc) (ref <130)
Total CHOL/HDL Ratio: 2.6 (calc) (ref <5.0)
Triglycerides: 111 mg/dL (ref <150)

## 2019-06-25 LAB — PSA: PSA: 0.6 ng/mL (ref <=4.0)

## 2019-06-25 LAB — MAGNESIUM: Magnesium: 2.1 mg/dL (ref 1.5–2.5)

## 2019-06-25 LAB — HEMOGLOBIN A1C
Hgb A1c MFr Bld: 5.3 % of total Hgb (ref <5.7)
Mean Plasma Glucose: 105 (calc)
eAG (mmol/L): 5.8 (calc)

## 2019-06-25 LAB — INSULIN, RANDOM: Insulin: 16.4 u[IU]/mL

## 2019-06-25 LAB — VITAMIN D 25 HYDROXY (VIT D DEFICIENCY, FRACTURES): Vit D, 25-Hydroxy: 62 ng/mL (ref 30–100)

## 2019-06-25 LAB — TSH: TSH: 4.86 mIU/L — ABNORMAL HIGH (ref 0.40–4.50)

## 2019-06-29 LAB — TB SKIN TEST
Induration: 0 mm
TB Skin Test: NEGATIVE

## 2019-07-08 DIAGNOSIS — Z23 Encounter for immunization: Secondary | ICD-10-CM | POA: Diagnosis not present

## 2019-07-31 DIAGNOSIS — Z23 Encounter for immunization: Secondary | ICD-10-CM | POA: Diagnosis not present

## 2019-08-23 ENCOUNTER — Other Ambulatory Visit: Payer: Self-pay

## 2019-08-23 ENCOUNTER — Ambulatory Visit (INDEPENDENT_AMBULATORY_CARE_PROVIDER_SITE_OTHER): Payer: BC Managed Care – PPO | Admitting: Internal Medicine

## 2019-08-23 ENCOUNTER — Encounter: Payer: Self-pay | Admitting: Internal Medicine

## 2019-08-23 VITALS — BP 122/90 | HR 67 | Ht 69.0 in | Wt 169.2 lb

## 2019-08-23 DIAGNOSIS — Q231 Congenital insufficiency of aortic valve: Secondary | ICD-10-CM | POA: Diagnosis not present

## 2019-08-23 NOTE — Progress Notes (Signed)
Cardiology Office Note   Date:  08/23/2019   ID:  Randy Allen, DOB [birth year], MRN [id number]  PCP:  Unk Pinto, MD  Cardiologist:   Dorris Carnes, MD   Pt referred by Dr Melford Aase for eval of aortic valve     History of Present Illness: Randy Allen is a 59 y.o. male with a history of HTN, HL, GERD, hypothyroidism.   He follows with Dr Melford Aase.  He also has a history of bicuspid AV and was followed by [facility] in the past  Never had an echo done  Pt dids say Dr Melford Aase has imaged his descending aorta   No CP  No SOB  Pt sits in front a computer screen daily     Does mow yard with push mower   Some weed eating   No dizziness or presyncope      Current Meds  Medication Sig  . amoxicillin (AMOXIL) 500 MG capsule Take 4 capsules 1 hour before Dental Procedure for Bicuspid Aortic Valve  . aspirin EC 81 MG tablet Take 81 mg by mouth daily.  Marland Kitchen atorvastatin (LIPITOR) 40 MG tablet Take 1 tablet Daily for Cholesterol  . B Complex Vitamins (VITAMIN B COMPLEX PO) Take by mouth daily.  . cetirizine (ZYRTEC) 10 MG tablet Take 10 mg by mouth daily. OTC  . Cholecalciferol (VITAMIN D PO) Take 5,000 Int'l Units by mouth daily.   . enalapril (VASOTEC) 20 MG tablet Take 1 tablet Daily for BP  . famotidine (PEPCID) 20 MG tablet Take 20 mg by mouth daily. OTC  . levothyroxine (SYNTHROID) 50 MCG tablet Take 1 tablet daily on an empty stomach with only water for 30 minutes & no Antacid meds, Calcium or Magnesium for 4 hours & avoid Biotin  . OVER THE COUNTER MEDICATION OTC Flonase PRN     Allergies:   Propofol and Zoloft [sertraline hcl]   Past Medical History:  Diagnosis Date  . Dental crowns present   . Heart murmur    states need for antibiotics prior to dental procedures; no cardiologist  . History of hepatitis A    age 63  . History of MRSA infection 2015   right great toe  . Hyperlipidemia   . Hypertension    states under control with med., has been on med. > 10 yr.  .  Hypothyroidism   . Mass of skin of right shoulder 04/2015  . Neck mass 04/2015   posterior  . Seasonal allergies   . Vitamin D deficiency     Past Surgical History:  Procedure Laterality Date  . COLONOSCOPY WITH PROPOFOL  10/21/2013  . EYE SURGERY Right 1966  . HYDROCELE EXCISION    . MASS EXCISION Right 04/26/2015   Procedure: EXCISION MASS NECK AND RIGHT SHOULDER ;  Surgeon: Johnathan Hausen, MD;  Location: [facility];  Service: General;  Laterality: Right;     Social History:  The patient  reports that he has never smoked. He has never used smokeless tobacco. He reports current alcohol use. He reports that he does not use drugs.   Family History:  The patient's family history includes Arthritis in his father and mother; Cancer in his father; Colon cancer (age of onset: 47) in his maternal uncle; Depression in his mother; Heart attack in his father; Migraines in his mother; Ulcers in his father.    ROS:  Please see the history of present illness. All other systems are reviewed and  Negative to  the above problem except as noted.    PHYSICAL EXAM: VS:  BP 122/90   Pulse 67   Ht 5\' 9"  (1.753 m)   Wt 169 lb 3.2 oz (76.7 kg)   SpO2 95%   BMI 24.99 kg/m   GEN: Well nourished, well developed, in no acute distress  HEENT: normal  Neck: no JVD, carotid bruits Cardiac: RRR; Gr II-III/VI systolic murmur LSB to base   No LE  edema  Respiratory:  clear to auscultation bilaterally, normal work of breathing GI: soft, nontender, nondistended, + BS  No hepatomegaly  MS: no deformity Moving all extremities   Skin: warm and dry, no rash Neuro:  Strength and sensation are intact Psych: euthymic mood, full affect   EKG:  EKG is not ordered today.   Lipid Panel    Component Value Date/Time   CHOL 118 06/24/2019 0852   TRIG 111 06/24/2019 0852   HDL 46 06/24/2019 0852   CHOLHDL 2.6 06/24/2019 0852   VLDL 24 04/18/2016 1109   LDLCALC 53 06/24/2019 0852      Wt Readings  from Last 3 Encounters:  08/23/19 169 lb 3.2 oz (76.7 kg)  06/24/19 170 lb 3.2 oz (77.2 kg)  06/04/18 162 lb 6.4 oz (73.7 kg)      ASSESSMENT AND PLAN:  1  AV disease   Pt with hx reported bicuspid AV  Never had echo   [place] supports this   WOuld set up pt for an echo to evaluate valve and proximal aorta  2  HTN  Diastolic is a little high   Follow for now    Goal:   Systolic less that [id number]  Diastolic les than 90  3  HL  Excellent control  LDL 53   Follow  Stay active     F/U tentatively in 1 year     Current medicines are reviewed at length with the patient today.  The patient does not have concerns regarding medicines.  Signed, Dorris Carnes, MD  08/23/2019 2:37 PM    [address], [place], [place]  [id number] Phone: [phone]; Fax: [phone]

## 2019-08-23 NOTE — Patient Instructions (Addendum)
Will schedule for transthoracic echo to evaluate aortic valve and aorta.   Keep on same meds       Tentative f/u 1 year  Addendum: Message to medical records to get copy of ultrasound-aorta. 08/23/19 5:18 pm.

## 2019-08-27 ENCOUNTER — Telehealth: Payer: Self-pay | Admitting: Internal Medicine

## 2019-08-27 NOTE — Telephone Encounter (Signed)
Medical records requested from [facility] - Unk Pinto, MD. 08/27/19 vlm

## 2019-09-09 ENCOUNTER — Other Ambulatory Visit: Payer: Self-pay

## 2019-09-09 ENCOUNTER — Ambulatory Visit (HOSPITAL_COMMUNITY): Payer: BC Managed Care – PPO | Attending: Cardiology

## 2019-09-09 DIAGNOSIS — Q231 Congenital insufficiency of aortic valve: Secondary | ICD-10-CM

## 2019-09-15 ENCOUNTER — Telehealth: Payer: Self-pay | Admitting: Internal Medicine

## 2019-09-15 NOTE — Telephone Encounter (Signed)
Pt calling in regarding his Echocardiogram results. I let him know that Dr. Harrington Challenger has not reviewed the results yet but we would give him a call once she does.

## 2019-09-15 NOTE — Telephone Encounter (Signed)
Patient states he is returning a call to discuss results from echocardiogram completed on 09/09/19. Please call.

## 2019-09-16 ENCOUNTER — Other Ambulatory Visit: Payer: Self-pay

## 2019-09-16 ENCOUNTER — Telehealth: Payer: Self-pay | Admitting: *Deleted

## 2019-09-16 DIAGNOSIS — I77819 Aortic ectasia, unspecified site: Secondary | ICD-10-CM

## 2019-09-16 DIAGNOSIS — I350 Nonrheumatic aortic (valve) stenosis: Secondary | ICD-10-CM

## 2019-09-16 DIAGNOSIS — Q231 Congenital insufficiency of aortic valve: Secondary | ICD-10-CM

## 2019-09-16 DIAGNOSIS — I351 Nonrheumatic aortic (valve) insufficiency: Secondary | ICD-10-CM

## 2019-09-16 NOTE — Telephone Encounter (Signed)
RE: pt needs to have CT Chest/Aorta scheduled per Dr. Harrington Challenger Received: Today Message Contents  Elijio Miles, LPN; Margretta Ditty, NT   Cc: P [facility]  We can definitely do that. Can you put in order for labs. Pt has HTN and prior labs are from March    Thanks  Lattie Haw          Order for BMET placed, per CT Protocol. CT scheduler to call and arrange BMET prior to CT Angio.

## 2019-09-16 NOTE — Telephone Encounter (Signed)
Pt will come to our office for lab work to check a BMET on 09/17/19.  Pt will come to our office for his scheduled CT Angio Chest/Aorta, on 09/24/19 at 3:30 pm.  Pt made aware of both appt date and times by CT Scheduler.  Will route this information to Dr. Alan Ripper Primary RN as a general FYI.

## 2019-09-16 NOTE — Telephone Encounter (Signed)
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 Told pt someone from office would call to schedule

## 2019-09-16 NOTE — Telephone Encounter (Signed)
Order for CT Angio of Chest Aorta w/wo contrast placed and a staff message has been sent to our CT Scheduler as well as our [facility] pool, to call the pt back and arrange this test, as ordered by Dr. Harrington Challenger.  Per Dr. Alan Ripper result note, she already made the pt aware of his echo results and recommendations to have this needed test, and that someone from the office will call him soon, to arrange this.  Scheduling to call and arrange test accordingly.

## 2019-09-16 NOTE — Addendum Note (Signed)
Addended by: Nuala Alpha on: 09/16/2019 10:39 AM   Modules accepted: Orders

## 2019-09-17 ENCOUNTER — Other Ambulatory Visit: Payer: BC Managed Care – PPO | Admitting: *Deleted

## 2019-09-17 ENCOUNTER — Other Ambulatory Visit: Payer: Self-pay

## 2019-09-17 DIAGNOSIS — I77819 Aortic ectasia, unspecified site: Secondary | ICD-10-CM

## 2019-09-17 DIAGNOSIS — Q231 Congenital insufficiency of aortic valve: Secondary | ICD-10-CM

## 2019-09-17 DIAGNOSIS — I350 Nonrheumatic aortic (valve) stenosis: Secondary | ICD-10-CM

## 2019-09-17 DIAGNOSIS — I351 Nonrheumatic aortic (valve) insufficiency: Secondary | ICD-10-CM

## 2019-09-17 LAB — BASIC METABOLIC PANEL
BUN/Creatinine Ratio: 14 (ref 9–20)
BUN: 15 mg/dL (ref 6–24)
CO2: 22 mmol/L (ref 20–29)
Calcium: 10 mg/dL (ref 8.7–10.2)
Chloride: 103 mmol/L (ref 96–106)
Creatinine, Ser: 1.06 mg/dL (ref 0.76–1.27)
GFR calc Af Amer: 89 mL/min/{1.73_m2} (ref >59)
GFR calc non Af Amer: 77 mL/min/{1.73_m2} (ref >59)
Glucose: 102 mg/dL — ABNORMAL HIGH (ref 65–99)
Potassium: 4.1 mmol/L (ref 3.5–5.2)
Sodium: 141 mmol/L (ref 134–144)

## 2019-09-24 ENCOUNTER — Other Ambulatory Visit: Payer: Self-pay

## 2019-09-24 ENCOUNTER — Ambulatory Visit (INDEPENDENT_AMBULATORY_CARE_PROVIDER_SITE_OTHER)
Admission: RE | Admit: 2019-09-24 | Discharge: 2019-09-24 | Disposition: A | Discharge status: Home / Self Care | Payer: BC Managed Care – PPO | Source: Ambulatory Visit | Attending: Internal Medicine | Admitting: Internal Medicine

## 2019-09-24 DIAGNOSIS — Q231 Congenital insufficiency of aortic valve: Secondary | ICD-10-CM | POA: Diagnosis not present

## 2019-09-24 DIAGNOSIS — I351 Nonrheumatic aortic (valve) insufficiency: Secondary | ICD-10-CM

## 2019-09-24 DIAGNOSIS — I77819 Aortic ectasia, unspecified site: Secondary | ICD-10-CM | POA: Diagnosis not present

## 2019-09-24 DIAGNOSIS — I712 Thoracic aortic aneurysm, without rupture: Secondary | ICD-10-CM | POA: Diagnosis not present

## 2019-09-24 DIAGNOSIS — I350 Nonrheumatic aortic (valve) stenosis: Secondary | ICD-10-CM

## 2019-09-24 MED ORDER — IOHEXOL 350 MG/ML SOLN
100.0000 mL | Freq: Once | INTRAVENOUS | Status: AC | PRN
  Administered 2019-09-24: 100 mL via INTRAVENOUS

## 2019-09-30 ENCOUNTER — Telehealth: Payer: Self-pay | Admitting: *Deleted

## 2019-09-30 DIAGNOSIS — I77819 Aortic ectasia, unspecified site: Secondary | ICD-10-CM

## 2019-09-30 NOTE — Telephone Encounter (Signed)
-----   Message from Fay Records, MD sent at 09/29/2019  1:14 PM EDT ----- CT done because echo showed dilated aorta.    CT defines aorta  Dilitations is there   Measurement about the same 5.2 cm I tried to call pt to review  No answer Would recomm he be set up with an appt with Leonarda Salon at [facility] who will review, assess for treatment (repair)

## 2019-09-30 NOTE — Telephone Encounter (Signed)
Abdominal findings noted   Will also be reviewed by Dr Roxan Hockey, Follow up scans should include f/u of abdomen/pelvic arteries

## 2019-09-30 NOTE — Telephone Encounter (Signed)
Informed the patient of results/review from Dr. Harrington Challenger.  Adv of referral to cardiac surgeon.  He would like Dr. 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  I adv that the radiologist recommends f/u w CT abd/pelvis in 6 months.    Mobile number is preferred.  Referral for CT surgery placed.

## 2019-10-04 NOTE — Telephone Encounter (Signed)
Patient has been informed. Has appointment with Dr. Roxan Hockey on 10/19/19.

## 2019-10-05 NOTE — Progress Notes (Signed)
FOLLOW UP  Assessment and Plan:   Bicuspid aortic valve Newly following with Dr. Harrington Challenger; denies concerning sx  Ascending aortic aneurysm ([place]) 52 mm; pending evaluation by Dr. 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   Reminder to go to the ER if any CP, SOB, nausea, dizziness, severe HA, changes vision/speech, left arm numbness and tingling and jaw pain.  Cholesterol Currently at goal; continue statin Continue low cholesterol diet and exercise.  Check lipid panel.   Abnormal glucose Recent A1Cs at goal Discussed diet/exercise, weight management  Defer A1C; check CMP  Hypothyroidism continue medications the same pending lab results reminded to take on an empty stomach 30-50mins before food - take with water only, not with coffee check TSH level  BMI 24 Continue to recommend diet heavy in fruits and veggies and low in animal meats, cheeses, and dairy products, appropriate calorie intake Discuss exercise recommendations routinely Continue to monitor weight at each visit  Vitamin D Def At goal at last visit; continue supplementation to maintain goal of 60-100 Defer Vit D level  Continue diet and meds as discussed. Further disposition pending results of labs. Discussed med's effects and SE's.   Over 30 minutes of exam, counseling, chart review, and critical decision making was performed.   Future Appointments  Date Time Provider [place]  10/19/2019  4:00 PM Melrose Nakayama, MD [facility]  01/10/2020  4:30 PM Unk Pinto, MD [facility] None  07/04/2020  9:00 AM Unk Pinto, MD [facility] None     ----------------------------------------------------------------------------------------------------------------------  HPI 59 y.o. male  presents for 3 month follow up on hypertension, cholesterol, glucose, weight and vitamin D deficiency.   Had known bicuspid aortic valve since 1988 (diagnosed while in the [employer]), had no sx, was just monitoring,  more recently was referred to cardiology for monitoring and saw Dr. Dorris Carnes who ordered echo 09/09/19 which showed normal LV/RV function, mildly stenotic bicuspid AV, dilated aorta of 52 mm. CTA of chest was performed 09/24/19 which showed ascending thoracic aorta approx 5.2 cm, aortic root 4.4-4.5 cm. Also noted focal aneurysmal disease of superior mesenteric artery trunk and mild splenic artery dilation. Recommended for 6 month follow up CTA and referral to cardiothoracic surgery - pending appointment with Dr. Roxan Hockey 10/19/2019.   BMI is Body mass index is 24.75 kg/m., he has been working on diet, admits to minimal exercise.  Wt Readings from Last 3 Encounters:  10/06/19 167 lb 9.6 oz (76 kg)  08/23/19 169 lb 3.2 oz (76.7 kg)  06/24/19 170 lb 3.2 oz (77.2 kg)   His blood pressure has been controlled at home, today their BP is BP: 120/90  He does not workout. He denies chest pain, shortness of breath, dizziness, edema.   He is on cholesterol medication Atorvastatin 20 mg daily and denies myalgias. His cholesterol is at goal. The cholesterol last visit was:   Lab Results  Component Value Date   CHOL 118 06/24/2019   HDL 46 06/24/2019   LDLCALC 53 06/24/2019   TRIG 111 06/24/2019   CHOLHDL 2.6 06/24/2019    He has not been working on diet and exercise for glucose management, and denies increased appetite, nausea, paresthesia of the feet, polydipsia, polyuria and visual disturbances. Last A1C in  the office was:  Lab Results  Component Value Date   HGBA1C 5.3 06/24/2019   He is on thyroid medication. His medication was not changed last  visit. He is taking 50 mcg daily, takes first thing in the morning with coffee with creamer.  Lab Results  Component Value Date   TSH 4.86 (H) 06/24/2019   Patient is on Vitamin D supplement.   Lab Results  Component Value Date   VD25OH 62 06/24/2019        Current Medications:  Current Outpatient Medications on File Prior to Visit  Medication Sig  . amoxicillin (AMOXIL) 500 MG capsule Take 4 capsules 1 hour before Dental Procedure for Bicuspid Aortic Valve  . Ascorbic Acid (VITAMIN C PO) Take by mouth daily.  Marland Kitchen aspirin EC 81 MG tablet Take 81 mg by mouth daily.  Marland Kitchen atorvastatin (LIPITOR) 40 MG tablet Take 1 tablet Daily for Cholesterol  . B Complex Vitamins (VITAMIN B COMPLEX PO) Take by mouth daily.  . cetirizine (ZYRTEC) 10 MG tablet Take 10 mg by mouth daily. OTC  . Cholecalciferol (VITAMIN D PO) Take 5,000 Int'l Units by mouth daily.   . enalapril (VASOTEC) 20 MG tablet Take 1 tablet Daily for BP  . famotidine (PEPCID) 20 MG tablet Take 20 mg by mouth daily. OTC  . levothyroxine (SYNTHROID) 50 MCG tablet Take 1 tablet daily on an empty stomach with only water for 30 minutes & no Antacid meds, Calcium or Magnesium for 4 hours & avoid Biotin  . OVER THE COUNTER MEDICATION OTC Flonase PRN  . Zinc 50 MG CAPS Take by mouth daily.   No current facility-administered medications on file prior to visit.     Allergies:  Allergies  Allergen Reactions  . Propofol Shortness Of Breath    CHEST TIGHTNESS  . Zoloft [Sertraline Hcl] Other (See Comments)    HYPERACTIVE     Medical History:  Past Medical History:  Diagnosis Date  . Dental crowns present   . Heart murmur    states need for antibiotics prior to dental procedures; no cardiologist  . History of hepatitis A    age 28  . History of MRSA infection 2015   right great toe  . Hyperlipidemia   . Hypertension    states under control with med., has been on med. > 10 yr.  . Hypothyroidism   . Mass of skin of right shoulder  04/2015  . Neck mass 04/2015   posterior  . Seasonal allergies   . Vitamin D deficiency    Family history- Reviewed and unchanged Social history- Reviewed and unchanged   Review of Systems:  Review of Systems  Constitutional: Negative for malaise/fatigue and weight loss.  HENT: Negative for hearing loss and tinnitus.   Eyes: Negative for blurred vision and double vision.  Respiratory: Negative for cough, shortness of breath and wheezing.   Cardiovascular: Negative for chest pain, palpitations, orthopnea, claudication and leg swelling.  Gastrointestinal: Negative for abdominal pain, blood in stool, constipation, diarrhea, heartburn, melena, nausea and vomiting.  Genitourinary: Negative.   Musculoskeletal: Negative for joint pain and myalgias.  Skin: Negative for rash.  Neurological: Negative for dizziness, tingling, sensory change, weakness and headaches.  Endo/Heme/Allergies: Negative for polydipsia.  Psychiatric/Behavioral: Negative.   All other systems reviewed and are negative.   Physical Exam: BP 120/90   Pulse 66   Temp 97.7 F (36.5 C)   Wt 167 lb 9.6 oz (76 kg)   SpO2 99%   BMI  24.75 kg/m  Wt Readings from Last 3 Encounters:  10/06/19 167 lb 9.6 oz (76 kg)  08/23/19 169 lb 3.2 oz (76.7 kg)  06/24/19 170 lb 3.2 oz (77.2 kg)   General Appearance: Well nourished, in no apparent distress. Eyes: PERRLA, EOMs, conjunctiva no swelling or erythema Sinuses: No Frontal/maxillary tenderness ENT/Mouth: Ext aud canals clear, TMs without erythema, bulging. No erythema, swelling, or exudate on post pharynx.  Tonsils not swollen or erythematous. Hearing normal.  Neck: Supple, thyroid normal.  Respiratory: Respiratory effort normal, BS equal bilaterally without rales, rhonchi, wheezing or stridor.  Cardio: RRR with 3/6 harsh blowing systolic murmur. Brisk peripheral pulses without edema.  Abdomen: Soft, + BS.  Non tender, no guarding, rebound, hernias, masses. Lymphatics: Non  tender without lymphadenopathy.  Musculoskeletal: Full ROM, 5/5 strength, Normal gait Skin: Warm, dry without rashes, lesions, ecchymosis.  Neuro: Cranial nerves intact. No cerebellar symptoms.  Psych: Awake and oriented X 3, normal affect, Insight and Judgment appropriate.    Izora Ribas, NP 4:17 PM [facility]

## 2019-10-06 ENCOUNTER — Encounter: Payer: Self-pay | Admitting: Adult Health

## 2019-10-06 ENCOUNTER — Other Ambulatory Visit: Payer: Self-pay

## 2019-10-06 ENCOUNTER — Ambulatory Visit: Payer: BC Managed Care – PPO | Admitting: Adult Health

## 2019-10-06 VITALS — BP 120/90 | HR 66 | Temp 97.7°F | Wt 167.6 lb

## 2019-10-06 DIAGNOSIS — E039 Hypothyroidism, unspecified: Secondary | ICD-10-CM | POA: Diagnosis not present

## 2019-10-06 DIAGNOSIS — E559 Vitamin D deficiency, unspecified: Secondary | ICD-10-CM | POA: Diagnosis not present

## 2019-10-06 DIAGNOSIS — Z79899 Other long term (current) drug therapy: Secondary | ICD-10-CM

## 2019-10-06 DIAGNOSIS — I10 Essential (primary) hypertension: Secondary | ICD-10-CM | POA: Diagnosis not present

## 2019-10-06 DIAGNOSIS — Q231 Congenital insufficiency of aortic valve: Secondary | ICD-10-CM

## 2019-10-06 DIAGNOSIS — R7309 Other abnormal glucose: Secondary | ICD-10-CM

## 2019-10-06 DIAGNOSIS — E782 Mixed hyperlipidemia: Secondary | ICD-10-CM

## 2019-10-06 DIAGNOSIS — Z6824 Body mass index (BMI) 24.0-24.9, adult: Secondary | ICD-10-CM

## 2019-10-06 DIAGNOSIS — I712 Thoracic aortic aneurysm, without rupture: Secondary | ICD-10-CM

## 2019-10-06 DIAGNOSIS — I728 Aneurysm of other specified arteries: Secondary | ICD-10-CM | POA: Insufficient documentation

## 2019-10-06 DIAGNOSIS — I7121 Aneurysm of the ascending aorta, without rupture: Secondary | ICD-10-CM | POA: Insufficient documentation

## 2019-10-06 MED ORDER — ATORVASTATIN CALCIUM 40 MG PO TABS: ORAL_TABLET | ORAL | 0 refills | Status: DC

## 2019-10-06 NOTE — Patient Instructions (Addendum)
Goals    . Blood Pressure < 130/80      Recommend taking levothyroxine with water only on an empty stomach - very sensitive to other foods/drinks/meds in the stomach and may not absorb well or predictably otherwise    Thoracic Aortic Aneurysm  An aneurysm is a bulge in an artery. It happens when blood pushes up against a weakened or damaged artery wall. A thoracic aortic aneurysm is an aneurysm that occurs in the first part of the aorta, between the heart and the diaphragm. The aorta is the main artery of the body. It supplies blood from the heart to the rest of the body. Some aneurysms may not cause symptoms or problems. However, a thoracic aortic aneurysm can cause two serious problems:  It can enlarge and burst (rupture).  It can cause blood to flow between the layers of the wall of the aorta through a tear (aortic dissection). Both of these problems are medical emergencies. They can cause bleeding inside the body and can be life-threatening if they are not diagnosed and treated right away. What are the causes? The exact cause of this condition is not known. What increases the risk? The following factors may make you more likely to develop this condition:  Being 68 years of age or older.  Having a family history of aneurysms.  Using tobacco.  Having any of these conditions: ? Hardening of the arteries caused by the buildup of fat and other substances in the lining of a blood vessel (arteriosclerosis). ? Inflammation of the walls of an artery (arteritis). ? A genetic disease that weakens the body's connective tissue, such as Marfan syndrome. ? An injury or trauma to the aorta. ? High blood pressure (hypertension). ? High cholesterol. ? An infection from bacteria, such as syphilis or staphylococcus, in the wall of the aorta (infectious aortitis). What are the signs or symptoms? Symptoms of this condition vary depending on the size of the aneurysm and how fast it is growing. Most  grow slowly and do not cause symptoms. When symptoms do occur, they may include:  Pain in the chest, back, sides, or abdomen. The pain may vary in intensity. Sudden, severe pain may indicate that the aneurysm has ruptured.  Hoarseness.  Cough.  Shortness of breath.  Swallowing problems.  Swelling in the face, arms, or legs.  Fever.  Unexplained weight loss. How is this diagnosed? This condition may be diagnosed with:  An ultrasound.  X-rays.  CT scan.  MRI.  A test to check the arteries for damage or blockages (angiogram). Most unruptured thoracic aortic aneurysms cause no symptoms, so they are often found during exams for other conditions. How is this treated? Treatment for this condition depends on:  The size of the aneurysm.  How fast the aneurysm is growing.  Your age.  Risk factors for rupture. Small aneurysms (2.2 inches, or 5.5 cm, or less) may be managed with:  Medicines to: ? Control blood pressure. ? Manage pain. ? Fight infection.  Regular monitoring. This may include an ultrasound or CT scan every year or every 6 months to see if the aneurysm is getting bigger. Large or fast-growing aneurysms may be treated with surgery. Follow these instructions at home: Eating and drinking   Eat a healthy diet. Your health care provider may recommend that you: ? Lower your salt (sodium) intake. In some people, too much salt can raise blood pressure and increase the risk for thoracic aortic aneurysm. ? Avoid foods that are high  in saturated fat and cholesterol, such as red meat and full-fat dairy. ? Eat a diet that is low in sugar. ? Increase your fiber intake by including whole grains, vegetables, and fruits in your diet. Eating these foods may help to lower your blood pressure.  Do not drink alcohol if your health care provider tells you not to drink.  If you drink alcohol: ? Limit how much you use to:  0-1 drink a day for women.  0-2 drinks a day for  men. ? Be aware of how much alcohol is in your drink. In the [place] one drink equals one 12 oz bottle of beer (355 mL), one 5 oz glass of wine (148 mL), or one 1 oz glass of hard liquor (44 mL). Lifestyle  Do not use any products that contain nicotine or tobacco, such as cigarettes, e-cigarettes, and chewing tobacco. If you need help quitting, ask your health care provider.  Maintain a healthy weight.  Check your blood pressure regularly. Follow your health care provider's instructions on how to keep your blood pressure within normal limits.  Have your blood sugar (glucose) level and cholesterol levels checked regularly. Follow your health care provider's instructions on how to keep levels within normal limits. Activity   Stay physically active and exercise regularly. Talk with your health care provider about how often you should exercise and ask which types of exercise are [place] for you.  Avoid heavy lifting and activities that take a lot of effort. Ask your health care provider what activities are safe for you. General instructions  Take over-the-counter and prescription medicines only as told by your health care provider.  Talk with your health care provider about regular screenings to see if the aneurysm is getting bigger.  Keep all follow-up visits as told by your health care provider. This is important. Contact a health care provider if you have:  Unexplained weight loss. Get help right away if you have:  Pain in your upper back, neck, or abdomen. This pain may move into your chest and arms.  Trouble swallowing.  A cough or hoarseness.  Shortness of breath. Summary  A thoracic aortic aneurysm is an aneurysm that occurs in the first part of the aorta, between the heart and the diaphragm.  As a thoracic aortic aneurysm becomes larger, it can burst (rupture), or blood can flow between the layers of the wall of the aorta through a tear (aorticdissection). These conditions can  be life-threatening if they are not diagnosed and treated right away.  If you have a thoracic aortic aneurysm, its growth will be closely monitored. Surgical repair may be needed for larger or faster-growing aneurysms. This information is not intended to replace advice given to you by your health care provider. Make sure you discuss any questions you have with your health care provider. Document Revised: 11/18/2017 Document Reviewed: 11/19/2017 Elsevier Patient Education  [place].

## 2019-10-07 ENCOUNTER — Encounter: Payer: Self-pay | Admitting: Adult Health

## 2019-10-07 ENCOUNTER — Other Ambulatory Visit: Payer: Self-pay | Admitting: Adult Health

## 2019-10-07 DIAGNOSIS — N289 Disorder of kidney and ureter, unspecified: Secondary | ICD-10-CM

## 2019-10-07 DIAGNOSIS — N182 Chronic kidney disease, stage 2 (mild): Secondary | ICD-10-CM

## 2019-10-07 LAB — COMPLETE METABOLIC PANEL WITH GFR
AG Ratio: 1.8 (calc) (ref 1.0–2.5)
ALT: 30 U/L (ref 9–46)
AST: 21 U/L (ref 10–35)
Albumin: 4.3 g/dL (ref 3.6–5.1)
Alkaline phosphatase (APISO): 72 U/L (ref 35–144)
BUN: 16 mg/dL (ref 7–25)
CO2: 28 mmol/L (ref 20–32)
Calcium: 9.5 mg/dL (ref 8.6–10.3)
Chloride: 106 mmol/L (ref 98–110)
Creat: 1.23 mg/dL (ref 0.70–1.33)
GFR, Est African American: 75 mL/min/{1.73_m2} (ref >=60)
GFR, Est Non African American: 64 mL/min/{1.73_m2} (ref >=60)
Globulin: 2.4 g/dL (calc) (ref 1.9–3.7)
Glucose, Bld: 92 mg/dL (ref 65–99)
Potassium: 4.3 mmol/L (ref 3.5–5.3)
Sodium: 140 mmol/L (ref 135–146)
Total Bilirubin: 0.6 mg/dL (ref 0.2–1.2)
Total Protein: 6.7 g/dL (ref 6.1–8.1)

## 2019-10-07 LAB — CBC WITH DIFFERENTIAL/PLATELET
Absolute Monocytes: 494 cells/uL (ref 200–950)
Basophils Absolute: 39 cells/uL (ref 0–200)
Basophils Relative: 0.6 %
Eosinophils Absolute: 241 cells/uL (ref 15–500)
Eosinophils Relative: 3.7 %
HCT: 44.7 % (ref 38.5–50.0)
Hemoglobin: 15.4 g/dL (ref 13.2–17.1)
Lymphs Abs: 1833 cells/uL (ref 850–3900)
MCH: 30.9 pg (ref 27.0–33.0)
MCHC: 34.5 g/dL (ref 32.0–36.0)
MCV: 89.8 fL (ref 80.0–100.0)
MPV: 9.7 fL (ref 7.5–12.5)
Monocytes Relative: 7.6 %
Neutro Abs: 3894 cells/uL (ref 1500–7800)
Neutrophils Relative %: 59.9 %
Platelets: 213 10*3/uL (ref 140–400)
RBC: 4.98 10*6/uL (ref 4.20–5.80)
RDW: 12.5 % (ref 11.0–15.0)
Total Lymphocyte: 28.2 %
WBC: 6.5 10*3/uL (ref 3.8–10.8)

## 2019-10-07 LAB — LIPID PANEL
Cholesterol: 111 mg/dL (ref <200)
HDL: 45 mg/dL (ref >=40)
LDL Cholesterol (Calc): 48 mg/dL (calc)
Non-HDL Cholesterol (Calc): 66 mg/dL (calc) (ref <130)
Total CHOL/HDL Ratio: 2.5 (calc) (ref <5.0)
Triglycerides: 99 mg/dL (ref <150)

## 2019-10-07 LAB — TSH: TSH: 4.33 mIU/L (ref 0.40–4.50)

## 2019-10-07 LAB — MAGNESIUM: Magnesium: 2 mg/dL (ref 1.5–2.5)

## 2019-10-12 ENCOUNTER — Encounter: Payer: Self-pay | Admitting: Thoracic Surgery (Cardiothoracic Vascular Surgery)

## 2019-10-19 ENCOUNTER — Encounter: Payer: Self-pay | Admitting: Thoracic Surgery (Cardiothoracic Vascular Surgery)

## 2019-10-19 ENCOUNTER — Other Ambulatory Visit: Payer: Self-pay

## 2019-10-19 ENCOUNTER — Institutional Professional Consult (permissible substitution): Payer: BC Managed Care – PPO | Admitting: Thoracic Surgery (Cardiothoracic Vascular Surgery)

## 2019-10-19 VITALS — BP 145/90 | HR 76 | Resp 20 | Ht 69.0 in | Wt 165.0 lb

## 2019-10-19 DIAGNOSIS — I712 Thoracic aortic aneurysm, without rupture: Secondary | ICD-10-CM | POA: Diagnosis not present

## 2019-10-19 DIAGNOSIS — I7121 Aneurysm of the ascending aorta, without rupture: Secondary | ICD-10-CM

## 2019-10-19 NOTE — Progress Notes (Signed)
PCP is Unk Pinto, MD Referring Provider is Fay Records, MD  Chief Complaint  Patient presents with  . Thoracic Aortic Aneurysm    Surgical eval, CTA Chest 09/24/19, ECHO 09/09/19    HPI: Randy Allen is sent for consultation regarding an ascending aortic aneurysm.  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  He has had a heart murmur since he was a child.  He is known about a bicuspid aortic valve since 1988 when he was in the [employer].  He been having some mild shortness of breath with heavy exertion and also some faint tightness in his chest with exertion.  He was referred to Dr.Ross for evaluation given his known bicuspid aortic valve and no recent echocardiogram.  Dr. Harrington Challenger saw him in May.  She had 2D echocardiogram which showed moderate aortic stenosis with a valve area of 1.18 cm2 and a mean gradient of 25.  He was also noted to have a dilated ascending aorta.  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  He tells me that he does have some tightness in his chest and shortness of breath with heavy exertion.  He has a large lot does a lot of yard work.  He does not have that with walking or routine activities.  Denies orthopnea, paroxysmal nocturnal dyspnea, and peripheral edema.   Past Medical History:  Diagnosis Date  . Dental crowns present   . Heart murmur    states need for antibiotics prior to dental procedures; no cardiologist  . History of hepatitis A    age 56  . History of MRSA infection 2015   right great toe  . Hyperlipidemia   . Hypertension    states under control with med., has been on med. > 10 yr.  . Hypothyroidism   . Mass of skin of right shoulder 04/2015  . Neck mass 04/2015   posterior  . Seasonal allergies   . Vitamin D deficiency     Past Surgical History:   Procedure Laterality Date  . COLONOSCOPY WITH PROPOFOL  10/21/2013  . EYE SURGERY Right 1966  . HYDROCELE EXCISION    . MASS EXCISION Right 04/26/2015   Procedure: EXCISION MASS NECK AND RIGHT SHOULDER ;  Surgeon: Johnathan Hausen, MD;  Location: [facility];  Service: General;  Laterality: Right;    Family History  Problem Relation Age of Onset  . Arthritis Mother   . Depression Mother   . Migraines Mother   . Cancer Father        prostate  . Arthritis Father   . Ulcers Father   . Heart attack Father   . Colon cancer Maternal Uncle 62    Social History Social History   Tobacco Use  . Smoking status: Never Smoker  . Smokeless tobacco: Never Used  Substance Use Topics  . Alcohol use: Yes    Comment: socially  . Drug use: No    Current Outpatient Medications  Medication Sig Dispense Refill  . amoxicillin (AMOXIL) 500 MG capsule Take 4 capsules 1 hour before Dental Procedure for Bicuspid Aortic Valve 20 capsule 0  . Ascorbic Acid (VITAMIN C PO) Take by mouth daily.    Marland Kitchen aspirin EC 81 MG tablet Take 81 mg by mouth daily.    Marland Kitchen atorvastatin (LIPITOR) 40 MG tablet Take 1/2 tablet Daily  for Cholesterol 90 tablet 0  . B Complex Vitamins (VITAMIN B COMPLEX PO) Take by mouth daily.    . cetirizine (ZYRTEC) 10 MG tablet Take 10 mg by mouth daily. OTC    . Cholecalciferol (VITAMIN D PO) Take 5,000 Int'l Units by mouth daily.     . enalapril (VASOTEC) 20 MG tablet Take 1 tablet Daily for BP 90 tablet 0  . famotidine (PEPCID) 20 MG tablet Take 20 mg by mouth daily. OTC    . levothyroxine (SYNTHROID) 50 MCG tablet Take 1 tablet daily on an empty stomach with only water for 30 minutes & no Antacid meds, Calcium or Magnesium for 4 hours & avoid Biotin 90 tablet 0  . OVER THE COUNTER MEDICATION OTC Flonase PRN    . Zinc 50 MG CAPS Take by mouth daily. (Patient not taking: Reported on 10/19/2019)     No current facility-administered medications for this visit.    Allergies   Allergen Reactions  . Propofol Shortness Of Breath    CHEST TIGHTNESS  . Zoloft [Sertraline Hcl] Other (See Comments)    HYPERACTIVE    Review of Systems  Constitutional: Positive for fatigue. Negative for activity change, appetite change and unexpected weight change.  HENT: Negative for trouble swallowing and voice change.   Eyes: Negative for visual disturbance.  Respiratory: Positive for shortness of breath (Mild with exertion).   Cardiovascular: Positive for chest pain (Mild with exertion).  Gastrointestinal: Positive for abdominal pain (Reflux).  Genitourinary: Negative for difficulty urinating and dysuria.  Musculoskeletal: Negative for arthralgias and myalgias.  Neurological: Negative for dizziness and syncope.  All other systems reviewed and are negative.   BP (!) 145/90   Pulse 76   Resp 20   Ht 5\' 9"  (1.753 m)   Wt 165 lb (74.8 kg)   SpO2 95% Comment: RA  BMI 24.37 kg/m  Physical Exam Vitals reviewed.  Constitutional:      Appearance: Normal appearance.  HENT:     Head: Normocephalic and atraumatic.  Eyes:     General: No scleral icterus.    Extraocular Movements: Extraocular movements intact.  Cardiovascular:     Rate and Rhythm: Normal rate and regular rhythm.     Heart sounds: Murmur (3/6 systolic) heard.   Pulmonary:     Effort: Pulmonary effort is normal. No respiratory distress.     Breath sounds: No wheezing or rales.  Abdominal:     General: There is no distension.     Palpations: Abdomen is soft.     Tenderness: There is no abdominal tenderness.  Musculoskeletal:        General: No swelling.  Skin:    General: Skin is warm and dry.  Neurological:     General: No focal deficit present.     Mental Status: He is alert and oriented to person, place, and time.     Cranial Nerves: No cranial nerve deficit.     Motor: No weakness.     Gait: Gait normal.    Diagnostic Tests: IMPRESSIONS    1. Left ventricular ejection fraction, by  estimation, is 60 to 65%. The  left ventricle has normal function. The left ventricle has no regional  wall motion abnormalities. There is mild left ventricular hypertrophy of  the basal-septal segment. Left  ventricular diastolic parameters were normal.  2. Right ventricular systolic function is normal. The right ventricular  size is normal. There is normal pulmonary artery systolic pressure.  3. The mitral valve is  normal in structure. Trivial mitral valve  regurgitation. No evidence of mitral stenosis.  4. The aortic valve is bicuspid. Aortic valve regurgitation is mild.  Moderate aortic valve stenosis. Aortic valve area, by VTI measures 1.18  cm. Aortic valve mean gradient measures 25.0 mmHg. Aortic valve Vmax  measures 3.07 m/s.  5. Aortic dilatation noted. There is mild dilatation at the level of the  sinuses of Valsalva measuring 44 mm and severly dilated ascending aorta  measuring 52 mm.  6. The inferior vena cava is normal in size with greater than 50%  respiratory variability, suggesting right atrial pressure of 3 mmHg.   CT ANGIOGRAPHY CHEST WITH CONTRAST  TECHNIQUE: Multidetector CT imaging of the chest was performed using the standard protocol during bolus administration of intravenous contrast. Multiplanar CT image reconstructions and MIPs were obtained to evaluate the vascular anatomy.  CONTRAST:  146mL OMNIPAQUE IOHEXOL 350 MG/ML SOLN  COMPARISON:  Report from an echocardiogram dated 09/09/2019  FINDINGS: Cardiovascular: The aortic valve is degenerated and partially calcified towards the posterior aspect of the aortic root. The aortic root is dilated and measures approximately 4.4-4.5 cm. The ascending thoracic aorta shows significant dilatation and measures approximately 5.2 cm in greatest diameter. The proximal arch measures 4.0 cm and the distal arch 2.4 cm. The descending thoracic aorta measures 2.1 cm. No significant atherosclerotic plaque  is identified. There is no evidence of aortic dissection or intramural hemorrhage. Visualized proximal great vessels demonstrate normal patency and normal branching anatomy.  The heart size is within normal limits. No pericardial fluid identified. No significant calcified coronary artery plaque visualized. Central pulmonary arteries are of normal caliber and normally patent.  Mediastinum/Nodes: No enlarged mediastinal, hilar, or axillary lymph nodes. Thyroid gland, trachea, and esophagus demonstrate no significant findings.  Lungs/Pleura: There is no evidence of pulmonary edema, consolidation, pneumothorax, nodule or pleural fluid.  Upper Abdomen: Irregular focal outpouching of the superior mesenteric artery trunk is consistent with focal aneurysmal disease with area of outpouching measuring approximately 1.1 x 0.7 x 0.9 cm. Without any evidence of atherosclerosis, this most likely represents a small pseudoaneurysm forming at the level of a small area of spontaneous dissection. The native arterial lumen is not compromised and appears normally patent. No thrombus is identified in the area vessel outpouching. There also is a mild area of relative dilatation of the mid to distal splenic artery measuring approximately 8 mm in diameter. These findings could be followed later with a CTA of the abdomen and pelvis.  Musculoskeletal: No chest wall abnormality. No acute or significant osseous findings.  Review of the MIP images confirms the above findings.  IMPRESSION: 1. Aneurysmal disease of the aortic root and ascending thoracic aorta with maximal diameter of the ascending thoracic aorta of approximately 5.2 cm. The aortic root measures 4.4-4.5 cm. No evidence of aortic dissection. Ascending thoracic aortic aneurysm. Recommend semi-annual imaging followup by CTA or MRA and referral to cardiothoracic surgery if not already obtained. This recommendation follows 2010  ACCF/AHA/AATS/ACR/ASA/SCA/SCAI/SIR/STS/SVM Guidelines for the Diagnosis and Management of Patients With Thoracic Aortic Disease. Circulation. 2010; 121: H852-D782. Aortic aneurysm NOS (ICD10-I71.9) 2. Calcified and degenerated aortic valve. 3. 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 Without any evidence of atherosclerosis, this most likely represents a small pseudoaneurysm forming at the level of a small area of spontaneous dissection. The native arterial lumen is not compromised and appears normally patent. There is also a mild  area of relative dilatation of the mid to distal splenic artery measuring approximately 8 mm in diameter. These findings could be followed later with a CTA of the abdomen and pelvis. If there is another CTA to follow-up the thoracic aortic aneurysm in roughly 6 months, CTA of the abdomen and pelvis could also be performed at that time. 4. Aortic aneurysm NOS (ICD10-I71.9).   Electronically Signed   By: Aletta Edouard M.D.   On: 09/25/2019 09:22 I personally reviewed the CT images and concur with the findings noted above  Impression: Randy Allen is a 59 year old man with bicuspid aortic valve with moderate aortic stenosis, hypertension, hyperlipidemia, and hypothyroidism.  Recent echocardiogram showed an ascending aneurysm.  CT angio of the chest confirmed a 5.2 cm ascending aneurysm and also showed small outpouching on the SMA and possible mild dilatation of the distal splenic artery.  Bicuspid aortic valve with moderate aortic stenosis-no indication for surgery at this time.  If hewere to become more symptomatic or have progression surgery would be indicated.  Ascending aneurysm-5.2 cm.  Blood pressure control is of primary importance.  No indication for surgery at this time.  Indications would include a size of 5.5 cm or an increase of 5 mm in 6 months.  Needs  semiannual follow-up.  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  Small and no indication for intervention.  Will include abdomen on CTA 6 months to get a better look at that.  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  Hypertension-blood pressure mildly elevated today at 145/90.  Ideally we like the systolic less than 626 but absolutely need to keep it under 140.  Would also like the systolic lower than 90.  He has a cuff at home but has not been using it on a regular basis.  I recommended that he check himself a couple times a day and if he is seeing systolics in the 948 range he will need additional medication, probably a beta-blocker to start.  Plan: Check blood pressure on regular basis.  Notify MD if systolic above 546, diastolic above 90 Return in 6 months with 2D echocardiogram and CT angio of chest, abdomen and pelvis  Spent 45 minutes today in review of records, review of images and in consultation with Mr. and Mrs. Martire. Melrose Nakayama, MD [facility] [phone]

## 2019-11-10 ENCOUNTER — Other Ambulatory Visit: Payer: Self-pay | Admitting: Internal Medicine

## 2019-11-10 MED ORDER — LEVOTHYROXINE SODIUM 50 MCG PO TABS: ORAL_TABLET | ORAL | 0 refills | Status: DC

## 2019-12-07 ENCOUNTER — Other Ambulatory Visit: Payer: Self-pay | Admitting: Internal Medicine

## 2020-01-09 ENCOUNTER — Encounter: Payer: Self-pay | Admitting: Internal Medicine

## 2020-01-09 NOTE — Patient Instructions (Signed)

## 2020-01-09 NOTE — Progress Notes (Signed)
History of Present Illness:       This very nice 59 y.o.  MWM presents for 6 month follow up with HTN, HLD, Hypothyroidism, Pre-Diabetes and Vitamin D Deficiency.       Patient is treated for HTN  (1999)  & BP has been controlled at home. Patient  Has CKD-2 (GFR 64). Today's BP is at goal - 124/86. Patient has had no complaints of any cardiac type chest pain, palpitations, dyspnea / orthopnea / PND, dizziness, claudication, or dependent edema. Patient had Cardiology consultation in May by Dr Dorris Carnes re: Bicuspid AoV and cardiac echo showed moderate stenosis with a dilated Ao root 51 mm confirmed by CT aortogram and patient was referred to Dr Chauncey Cruel. [place]. Patient was recommended semi-annual monitoring.       Hyperlipidemia is controlled with diet & Atorvastatin. Patient denies myalgias or other med SE's.  Last Lipids were at goal:  Lab Results  Component Value Date   CHOL 111 10/06/2019   HDL 45 10/06/2019   LDLCALC 48 10/06/2019   TRIG 99 10/06/2019   CHOLHDL 2.5 10/06/2019    Also, the patient his followed expectantly for glucose intolerance and has had no symptoms of reactive hypoglycemia, diabetic polys, paresthesias or visual blurring.  Last A1c was Normal & at goal:  Lab Results  Component Value Date   HGBA1C 5.3 06/24/2019                                              Patient was initiated on thyroid replacement when dx'd Hypothyroid in 2011.        Further, the patient also has history of Vitamin D Deficiency ("12" /2008) and supplements vitamin D without any suspected side-effects. Last vitamin D was at goal:  Lab Results  Component Value Date   VD25OH 62 06/24/2019    Current Outpatient Medications on File Prior to Visit  Medication Sig  . amoxicillin 500 MG capsule Take 4 capsules 1 hour before Dental Procedure for Bicuspid Aortic Valve  . VITAMIN C Take daily.  Marland Kitchen aspirin EC 81 MG tablet Take  daily.  Marland Kitchen atorvastatin  40 MG tablet Take 1 tablet Daily     . B Complex Vitamins  Take daily.  . cetirizine  10 MG tablet Take 1 daily  . VITAMIN D  Take 5,000 Int'l Units  daily.   . Enalapril 20 MG tablet Take 1 tablet Daily for BP  . levothyroxine 50 MCG tablet Take 1 tablet daily  . OTC Flonase PRN      Allergies  Allergen Reactions  . Propofol Shortness Of Breath    CHEST TIGHTNESS  . Zoloft [Sertraline Hcl] Other (See Comments)    HYPERACTIVE    PMHx:   Past Medical History:  Diagnosis Date  . Dental crowns present   . Heart murmur    states need for antibiotics prior to dental procedures; no cardiologist  . History of hepatitis A    age 19  . History of MRSA infection 2015   right great toe  . Hyperlipidemia   . Hypertension    states under control with med., has been on med. > 10 yr.  . Hypothyroidism   . Mass of skin of right shoulder 04/2015  . Neck mass 04/2015   posterior  . Seasonal allergies   .  Vitamin D deficiency     Immunization History  Administered Date(s) Administered  . PPD Test 02/22/2013, 02/24/2014, 03/08/2015, 04/18/2016, 06/04/2018, 06/24/2019  . Pneumococcal-Unspecified 12/16/2008  . Td 04/15/2005  . Tdap 10/27/2013    Past Surgical History:  Procedure Laterality Date  . COLONOSCOPY WITH PROPOFOL  10/21/2013  . EYE SURGERY Right 1966  . HYDROCELE EXCISION    . MASS EXCISION Right 04/26/2015   Procedure: EXCISION MASS NECK AND RIGHT SHOULDER ;  Surgeon: Johnathan Hausen, MD;  Location: [facility];  Service: General;  Laterality: Right;    FHx:    Reviewed / unchanged  SHx:    Reviewed / unchanged   Systems Review:  Constitutional: Denies fever, chills, wt changes, headaches, insomnia, fatigue, night sweats, change in appetite. Eyes: Denies redness, blurred vision, diplopia, discharge, itchy, watery eyes.  ENT: Denies discharge, congestion, post nasal drip, epistaxis, sore throat, earache, hearing loss, dental pain, tinnitus, vertigo, sinus pain, snoring.  CV: Denies chest  pain, palpitations, irregular heartbeat, syncope, dyspnea, diaphoresis, orthopnea, PND, claudication or edema. Respiratory: denies cough, dyspnea, DOE, pleurisy, hoarseness, laryngitis, wheezing.  Gastrointestinal: Denies dysphagia, odynophagia, heartburn, reflux, water brash, abdominal pain or cramps, nausea, vomiting, bloating, diarrhea, constipation, hematemesis, melena, hematochezia  or hemorrhoids. Genitourinary: Denies dysuria, frequency, urgency, nocturia, hesitancy, discharge, hematuria or flank pain. Musculoskeletal: Denies arthralgias, myalgias, stiffness, jt. swelling, pain, limping or strain/sprain.  Skin: Denies pruritus, rash, hives, warts, acne, eczema or change in skin lesion(s). Neuro: No weakness, tremor, incoordination, spasms, paresthesia or pain. Psychiatric: Denies confusion, memory loss or sensory loss. Endo: Denies change in weight, skin or hair change.  Heme/Lymph: No excessive bleeding, bruising or enlarged lymph nodes.  Physical Exam  BP 124/86   Pulse 74   Temp 97.9 F (36.6 C)   Wt 169 lb 12.8 oz (77 kg)   SpO2 97%   BMI 25.08 kg/m   Appears  well nourished, well groomed  and in no distress.  Eyes: PERRLA, EOMs, conjunctiva no swelling or erythema. Sinuses: No frontal/maxillary tenderness ENT/Mouth: EAC's clear, TM's nl w/o erythema, bulging. Nares clear w/o erythema, swelling, exudates. Oropharynx clear without erythema or exudates. Oral hygiene is good. Tongue normal, non obstructing. Hearing intact.  Neck: Supple. Thyroid not palpable. Car 2+/2+ without bruits, nodes or JVD. Chest: Respirations nl with BS clear & equal w/o rales, rhonchi, wheezing or stridor.  Cor: Heart sounds normal w/ regular rate and rhythm with gr 2/4 soft slowing  sys  Murmur in the parasternal area. Peripheral pulses normal and equal  without edema.  Abdomen: Soft & bowel sounds normal. Non-tender w/o guarding, rebound, hernias, masses or organomegaly.  Lymphatics: Unremarkable.   Musculoskeletal: Full ROM all peripheral extremities, joint stability, 5/5 strength and normal gait.  Skin: Warm, dry without exposed rashes, lesions or ecchymosis apparent.  Neuro: Cranial nerves intact, reflexes equal bilaterally. Sensory-motor testing grossly intact. Tendon reflexes grossly intact.  Pysch: Alert & oriented x 3.  Insight and judgement nl & appropriate. No ideations.  Assessment and Plan:  1. Essential hypertension  - Continue medication, monitor blood pressure at home.  - Continue DASH diet.  Reminder to go to the ER if any CP,  SOB, nausea, dizziness, severe HA, changes vision/speech.  - COMPLETE METABOLIC PANEL WITH GFR - Magnesium - TSH  2. Hyperlipidemia, mixed  - Continue diet/meds, exercise,& lifestyle modifications.  - Continue monitor periodic cholesterol/liver & renal functions   - Lipid panel - TSH  3. Abnormal glucose  - Continue diet, exercise  -  Lifestyle modifications.  - Monitor appropriate labs.  - Hemoglobin A1c - Insulin, random  4. Hypothyroidism  - Continue supplementation.  - TSH  5. Vitamin D deficiency  - VITAMIN D 25 Hydroxy  6. Bicuspid aortic valve   7. Ascending aortic aneurysm ([place])   8. CKD (chronic kidney disease) stage 2, GFR 60-89 ml/min  - COMPLETE METABOLIC PANEL WITH GFR  9. Medication management  - CBC with Differential/Platelet - COMPLETE METABOLIC PANEL WITH GFR - Magnesium - Lipid panel - TSH - Hemoglobin A1c - Insulin, random - VITAMIN D 25 Hydroxy        Discussed  regular exercise, BP monitoring, weight control to achieve/maintain BMI less than 25 and discussed med and SE's. Recommended labs to assess and monitor clinical status with further disposition pending results of labs.  I discussed the assessment and treatment plan with the patient. The patient was provided an opportunity to ask questions and all were answered. The patient agreed with the plan and demonstrated an understanding of  the instructions.  I provided over 30 minutes of exam, counseling, chart review and  complex critical decision making.       The patient was advised to call back or seek an in-person evaluation if the symptoms worsen or if the condition fails to improve as anticipated.   Kirtland Bouchard, MD

## 2020-01-10 ENCOUNTER — Encounter: Payer: Self-pay | Admitting: Internal Medicine

## 2020-01-10 ENCOUNTER — Other Ambulatory Visit: Payer: Self-pay

## 2020-01-10 ENCOUNTER — Ambulatory Visit: Payer: BC Managed Care – PPO | Admitting: Internal Medicine

## 2020-01-10 VITALS — BP 124/86 | HR 74 | Temp 97.9°F | Ht 69.0 in | Wt 169.8 lb

## 2020-01-10 DIAGNOSIS — N182 Chronic kidney disease, stage 2 (mild): Secondary | ICD-10-CM

## 2020-01-10 DIAGNOSIS — Z79899 Other long term (current) drug therapy: Secondary | ICD-10-CM | POA: Diagnosis not present

## 2020-01-10 DIAGNOSIS — I10 Essential (primary) hypertension: Secondary | ICD-10-CM

## 2020-01-10 DIAGNOSIS — E039 Hypothyroidism, unspecified: Secondary | ICD-10-CM | POA: Diagnosis not present

## 2020-01-10 DIAGNOSIS — E559 Vitamin D deficiency, unspecified: Secondary | ICD-10-CM | POA: Diagnosis not present

## 2020-01-10 DIAGNOSIS — I7121 Aneurysm of the ascending aorta, without rupture: Secondary | ICD-10-CM

## 2020-01-10 DIAGNOSIS — R7309 Other abnormal glucose: Secondary | ICD-10-CM | POA: Diagnosis not present

## 2020-01-10 DIAGNOSIS — E782 Mixed hyperlipidemia: Secondary | ICD-10-CM

## 2020-01-10 DIAGNOSIS — Q231 Congenital insufficiency of aortic valve: Secondary | ICD-10-CM

## 2020-01-10 DIAGNOSIS — I712 Thoracic aortic aneurysm, without rupture: Secondary | ICD-10-CM

## 2020-01-11 LAB — CBC WITH DIFFERENTIAL/PLATELET
Absolute Monocytes: 550 cells/uL (ref 200–950)
Basophils Absolute: 32 cells/uL (ref 0–200)
Basophils Relative: 0.5 %
Eosinophils Absolute: 262 cells/uL (ref 15–500)
Eosinophils Relative: 4.1 %
HCT: 44.4 % (ref 38.5–50.0)
Hemoglobin: 15.1 g/dL (ref 13.2–17.1)
Lymphs Abs: 1830 cells/uL (ref 850–3900)
MCH: 30.6 pg (ref 27.0–33.0)
MCHC: 34 g/dL (ref 32.0–36.0)
MCV: 90.1 fL (ref 80.0–100.0)
MPV: 9.7 fL (ref 7.5–12.5)
Monocytes Relative: 8.6 %
Neutro Abs: 3725 cells/uL (ref 1500–7800)
Neutrophils Relative %: 58.2 %
Platelets: 223 10*3/uL (ref 140–400)
RBC: 4.93 10*6/uL (ref 4.20–5.80)
RDW: 12.9 % (ref 11.0–15.0)
Total Lymphocyte: 28.6 %
WBC: 6.4 10*3/uL (ref 3.8–10.8)

## 2020-01-11 LAB — COMPLETE METABOLIC PANEL WITH GFR
AG Ratio: 1.8 (calc) (ref 1.0–2.5)
ALT: 43 U/L (ref 9–46)
AST: 29 U/L (ref 10–35)
Albumin: 4.3 g/dL (ref 3.6–5.1)
Alkaline phosphatase (APISO): 84 U/L (ref 35–144)
BUN: 14 mg/dL (ref 7–25)
CO2: 29 mmol/L (ref 20–32)
Calcium: 9.5 mg/dL (ref 8.6–10.3)
Chloride: 105 mmol/L (ref 98–110)
Creat: 1.01 mg/dL (ref 0.70–1.33)
GFR, Est African American: 95 mL/min/{1.73_m2} (ref >=60)
GFR, Est Non African American: 82 mL/min/{1.73_m2} (ref >=60)
Globulin: 2.4 g/dL (calc) (ref 1.9–3.7)
Glucose, Bld: 94 mg/dL (ref 65–99)
Potassium: 4.3 mmol/L (ref 3.5–5.3)
Sodium: 140 mmol/L (ref 135–146)
Total Bilirubin: 0.5 mg/dL (ref 0.2–1.2)
Total Protein: 6.7 g/dL (ref 6.1–8.1)

## 2020-01-11 LAB — VITAMIN D 25 HYDROXY (VIT D DEFICIENCY, FRACTURES): Vit D, 25-Hydroxy: 69 ng/mL (ref 30–100)

## 2020-01-11 LAB — LIPID PANEL
Cholesterol: 136 mg/dL (ref <200)
HDL: 48 mg/dL (ref >=40)
LDL Cholesterol (Calc): 65 mg/dL (calc)
Non-HDL Cholesterol (Calc): 88 mg/dL (calc) (ref <130)
Total CHOL/HDL Ratio: 2.8 (calc) (ref <5.0)
Triglycerides: 157 mg/dL — ABNORMAL HIGH (ref <150)

## 2020-01-11 LAB — HEMOGLOBIN A1C
Hgb A1c MFr Bld: 5.2 % of total Hgb (ref <5.7)
Mean Plasma Glucose: 103 (calc)
eAG (mmol/L): 5.7 (calc)

## 2020-01-11 LAB — INSULIN, RANDOM: Insulin: 18.8 u[IU]/mL

## 2020-01-11 LAB — MAGNESIUM: Magnesium: 2.1 mg/dL (ref 1.5–2.5)

## 2020-01-11 LAB — TSH: TSH: 7.14 mIU/L — ABNORMAL HIGH (ref 0.40–4.50)

## 2020-01-11 NOTE — Progress Notes (Signed)
========================================================== -   Test results slightly outside the reference range are not unusual. If there is anything important, I will review this with you,  otherwise it is considered normal test values.  If you have further questions,  please do not hesitate to contact me at the office or via My Chart.  ==========================================================  -  Total Chol = 136 and LDL = 65 - Both  Excellent   - Very low risk for Heart Attack  / Stroke =========================================================  - A1c - Normal - Great  - No Diabetes ! ==========================================================  -  Vitamin D = 69 - Excellent Level ==========================================================  -  All Else - CBC - Kidneys - Electrolytes - Liver  - Magnesium & Thyroid    - all  Normal / OK ==========================================================   - Keep up the [place] Work ! ==========================================================  -

## 2020-01-13 ENCOUNTER — Other Ambulatory Visit: Payer: Self-pay | Admitting: Internal Medicine

## 2020-03-23 ENCOUNTER — Other Ambulatory Visit: Payer: Self-pay | Admitting: Internal Medicine

## 2020-03-23 MED ORDER — LEVOTHYROXINE SODIUM 50 MCG PO TABS
ORAL_TABLET | ORAL | 0 refills | Status: DC
Start: 2020-03-23 — End: 2020-07-04

## 2020-04-03 ENCOUNTER — Other Ambulatory Visit: Payer: Self-pay | Admitting: *Deleted

## 2020-04-03 DIAGNOSIS — I712 Thoracic aortic aneurysm, without rupture, unspecified: Secondary | ICD-10-CM

## 2020-04-03 DIAGNOSIS — I728 Aneurysm of other specified arteries: Secondary | ICD-10-CM

## 2020-04-03 DIAGNOSIS — I7121 Aneurysm of the ascending aorta, without rupture: Secondary | ICD-10-CM

## 2020-04-10 ENCOUNTER — Other Ambulatory Visit: Payer: Self-pay | Admitting: *Deleted

## 2020-04-10 DIAGNOSIS — I350 Nonrheumatic aortic (valve) stenosis: Secondary | ICD-10-CM

## 2020-04-10 NOTE — Progress Notes (Unsigned)
ec

## 2020-04-11 ENCOUNTER — Ambulatory Visit: Payer: BC Managed Care – PPO | Admitting: Adult Health Nurse Practitioner

## 2020-04-11 ENCOUNTER — Encounter: Payer: Self-pay | Admitting: Adult Health Nurse Practitioner

## 2020-04-11 ENCOUNTER — Other Ambulatory Visit: Payer: Self-pay

## 2020-04-11 VITALS — BP 140/90 | HR 63 | Temp 97.3°F | Wt 172.0 lb

## 2020-04-11 DIAGNOSIS — E782 Mixed hyperlipidemia: Secondary | ICD-10-CM

## 2020-04-11 DIAGNOSIS — I7121 Aneurysm of the ascending aorta, without rupture: Secondary | ICD-10-CM

## 2020-04-11 DIAGNOSIS — Q2381 Bicuspid aortic valve: Secondary | ICD-10-CM

## 2020-04-11 DIAGNOSIS — N182 Chronic kidney disease, stage 2 (mild): Secondary | ICD-10-CM

## 2020-04-11 DIAGNOSIS — E039 Hypothyroidism, unspecified: Secondary | ICD-10-CM

## 2020-04-11 DIAGNOSIS — Q231 Congenital insufficiency of aortic valve: Secondary | ICD-10-CM

## 2020-04-11 DIAGNOSIS — Z6824 Body mass index (BMI) 24.0-24.9, adult: Secondary | ICD-10-CM

## 2020-04-11 DIAGNOSIS — I728 Aneurysm of other specified arteries: Secondary | ICD-10-CM

## 2020-04-11 DIAGNOSIS — R7309 Other abnormal glucose: Secondary | ICD-10-CM

## 2020-04-11 DIAGNOSIS — E559 Vitamin D deficiency, unspecified: Secondary | ICD-10-CM

## 2020-04-11 DIAGNOSIS — I10 Essential (primary) hypertension: Secondary | ICD-10-CM

## 2020-04-11 DIAGNOSIS — Z79899 Other long term (current) drug therapy: Secondary | ICD-10-CM

## 2020-04-11 DIAGNOSIS — I712 Thoracic aortic aneurysm, without rupture: Secondary | ICD-10-CM

## 2020-04-11 MED ORDER — ATORVASTATIN CALCIUM 40 MG PO TABS: ORAL_TABLET | ORAL | 1 refills | Status: DC

## 2020-04-11 NOTE — Progress Notes (Signed)
FOLLOW UP 3 MONTH   Assessment and Plan:    Hypertension Well controlled systolic with current medications  Monitor blood pressure at home; patient to call if consistently greater than 130/80 Continue DASH diet.   Reminder to go to the ER if any CP, SOB, nausea, dizziness, severe HA, changes vision/speech, left arm numbness and tingling and jaw pain.  Hyperlipademia Currently at goal; continue statin Continue low cholesterol diet and exercise.  Check lipid panel.   Bicuspid aortic valve Newly following with Dr. Harrington Challenger; denies concerning sx  Ascending aortic aneurysm ([place]) 52 mm; pending evaluation by Dr. Roxan Hockey Planning 6 month CTA follow up, cardiology following, next 04/2019 Control blood pressure  Superior mesenteric artery aneurysm (HCC) Control BP, recheck CTA in 6 months  Abnormal glucose Recent A1Cs at goal Discussed diet/exercise, weight management  Defer A1C; check CMP  Hypothyroidism Taking levothyroxine 50 mcg daily Reminder to take on an empty stomach 30-58mins before first meal of the day. No antacid medications for 4 hours. check TSH level  BMI 25.4 Continue to recommend diet heavy in fruits and veggies and low in animal meats, cheeses, and dairy products, appropriate calorie intake Discuss exercise recommendations routinely Continue to monitor weight at each visit  Vitamin D Def At goal at last visit; continue supplementation to maintain goal of 60-100 Defer Vit D level Diagnoses and all orders for this visit:  CKD (chronic kidney disease) stage 2, GFR 60-89 ml/min Increase fluids  Avoid NSAIDS Blood pressure control Monitor sugars  Will continue to monitor  BMI 24.0-24.9, adult Discussed dietary and exercise modifications  Medication management Continued   Further disposition pending results if labs check today. Discussed med's effects and SE's.   Over 30 minutes of face to face interview, exam, counseling, chart review, and critical  decision making was performed.    Future Appointments  Date Time Provider [place]  05/02/2020  1:40 PM [facility] 1 [facility]  05/02/2020  2:00 PM [facility] 1 [facility]  05/09/2020  1:45 PM Melrose Nakayama, MD [facility]  07/04/2020  9:00 AM Unk Pinto, MD [facility] None    ----------------------------------------------------------------------------------------------------------------------  HPI 59 y.o. male  presents for 3 month follow up on hypertension, cholesterol, glucose, weight and vitamin D deficiency.  He reports overall he is doing well.  He has no health or medication concerns today.  Had known bicuspid aortic valve since 1988 (diagnosed while in the [employer]), had no sx, was just monitoring,  more recently was referred to cardiology for monitoring and saw Dr. Dorris Carnes who ordered echo 09/09/19 which showed normal LV/RV function, mildly stenotic bicuspid AV, dilated aorta of 52 mm. CTA of chest was performed 09/24/19 which showed ascending thoracic aorta approx 5.2 cm, aortic root 4.4-4.5 cm. Also noted focal aneurysmal disease of superior mesenteric artery trunk and mild splenic artery dilation. Recommended for 6 month follow up CTA and referral to cardiothoracic surgery - pending appointment with Dr. Roxan Hockey 10/19/2019.   BMI is Body mass index is 25.4 kg/m., he has been working on diet, admits to minimal exercise.  Wt Readings from Last 3 Encounters:  04/11/20 172 lb (78 kg)  01/10/20 169 lb 12.8 oz (77 kg)  10/19/19 165 lb (74.8 kg)   His blood pressure has been controlled at home, today their BP is BP: 140/90  He does not workout. He denies chest pain, shortness of breath, dizziness, edema.  He checks his blood pressure at home and reports ranges from 120's-130's over  80's.   He is on cholesterol medication Atorvastatin 20 mg daily and denies myalgias. His cholesterol is at goal. The cholesterol last visit was:   Lab Results   Component Value Date   CHOL 136 01/10/2020   HDL 48 01/10/2020   LDLCALC 65 01/10/2020   TRIG 157 (H) 01/10/2020   CHOLHDL 2.8 01/10/2020    He has not been working on diet and exercise for glucose management, and denies increased appetite, nausea, paresthesia of the feet, polydipsia, polyuria and visual disturbances. Last A1C in the office was:  Lab Results  Component Value Date   HGBA1C 5.2 01/10/2020   He is on thyroid medication. His medication was not changed last visit. He is taking 50 mcg daily, takes first thing in the morning with coffee with creamer.  Lab Results  Component Value Date   TSH 7.14 (H) 01/10/2020   Patient is on Vitamin D supplement.   Lab Results  Component Value Date   VD25OH 69 01/10/2020        Current Medications:  Current Outpatient Medications on File Prior to Visit  Medication Sig  . Ascorbic Acid (VITAMIN C PO) Take by mouth daily.  Marland Kitchen. aspirin EC 81 MG tablet Take 81 mg by mouth daily.  Marland Kitchen. atorvastatin (LIPITOR) 40 MG tablet Take 1 tablet Daily  for Cholesterol  . B Complex Vitamins (VITAMIN B COMPLEX PO) Take by mouth daily.  . cetirizine (ZYRTEC) 10 MG tablet Take 10 mg by mouth daily. OTC  . Cholecalciferol (VITAMIN D PO) Take 5,000 Int'l Units by mouth daily.   . enalapril (VASOTEC) 20 MG tablet Take      1 tablet      Daily       for BP  . famotidine (PEPCID) 20 MG tablet Take 20 mg by mouth daily. OTC  . levothyroxine (SYNTHROID) 50 MCG tablet Take 1 tablet daily on an empty stomach with only water for 30 minutes & no Antacid meds, Calcium or Magnesium for 4 hours & avoid Biotin  . OVER THE COUNTER MEDICATION OTC Flonase PRN  . Zinc 50 MG CAPS Take by mouth daily.  Marland Kitchen. amoxicillin (AMOXIL) 500 MG capsule Take 4 capsules 1 hour before Dental Procedure for Bicuspid Aortic Valve (Patient not taking: Reported on 04/11/2020)   No current facility-administered medications on file prior to visit.     Allergies:  Allergies  Allergen  Reactions  . Propofol Shortness Of Breath    CHEST TIGHTNESS  . Zoloft [Sertraline Hcl] Other (See Comments)    HYPERACTIVE     Medical History:  Past Medical History:  Diagnosis Date  . Dental crowns present   . Heart murmur    states need for antibiotics prior to dental procedures; no cardiologist  . History of hepatitis A    age 89  . History of MRSA infection 2015   right great toe  . Hyperlipidemia   . Hypertension    states under control with med., has been on med. > 10 yr.  . Hypothyroidism   . Mass of skin of right shoulder 04/2015  . Neck mass 04/2015   posterior  . Seasonal allergies   . Vitamin D deficiency    Family history- Reviewed and unchanged Social history- Reviewed and unchanged   Review of Systems:  Review of Systems  Constitutional: Negative for malaise/fatigue and weight loss.  HENT: Negative for hearing loss and tinnitus.   Eyes: Negative for blurred vision and double vision.  Respiratory: Negative  for cough, shortness of breath and wheezing.   Cardiovascular: Negative for chest pain, palpitations, orthopnea, claudication and leg swelling.  Gastrointestinal: Negative for abdominal pain, blood in stool, constipation, diarrhea, heartburn, melena, nausea and vomiting.  Genitourinary: Negative.   Musculoskeletal: Negative for joint pain and myalgias.  Skin: Negative for rash.  Neurological: Negative for dizziness, tingling, sensory change, weakness and headaches.  Endo/Heme/Allergies: Negative for polydipsia.  Psychiatric/Behavioral: Negative.   All other systems reviewed and are negative.   Physical Exam: BP 140/90   Pulse 63   Temp (!) 97.3 F (36.3 C)   Wt 172 lb (78 kg)   SpO2 98%   BMI 25.40 kg/m  Wt Readings from Last 3 Encounters:  04/11/20 172 lb (78 kg)  01/10/20 169 lb 12.8 oz (77 kg)  10/19/19 165 lb (74.8 kg)   General Appearance: Well nourished, in no apparent distress. Eyes: PERRLA, EOMs, conjunctiva no swelling or  erythema Sinuses: No Frontal/maxillary tenderness ENT/Mouth: Ext aud canals clear, TMs without erythema, bulging. No erythema, swelling, or exudate on post pharynx.  Tonsils not swollen or erythematous. Hearing normal.  Neck: Supple, thyroid normal.  Respiratory: Respiratory effort normal, BS equal bilaterally without rales, rhonchi, wheezing or stridor.  Cardio: RRR with 3/6 harsh blowing systolic murmur. Brisk peripheral pulses without edema.  Abdomen: Soft, + BS.  Non tender, no guarding, rebound, hernias, masses. Lymphatics: Non tender without lymphadenopathy.  Musculoskeletal: Full ROM, 5/5 strength, Normal gait Skin: Warm, dry without rashes, lesions, ecchymosis.  Neuro: Cranial nerves intact. No cerebellar symptoms.  Psych: Awake and oriented X 3, normal affect, Insight and Judgment appropriate.    Elder Negus, NP 4:09 PM [facility]

## 2020-04-12 LAB — COMPLETE METABOLIC PANEL WITH GFR
AG Ratio: 1.8 (calc) (ref 1.0–2.5)
ALT: 39 U/L (ref 9–46)
AST: 29 U/L (ref 10–35)
Albumin: 4.4 g/dL (ref 3.6–5.1)
Alkaline phosphatase (APISO): 74 U/L (ref 35–144)
BUN: 19 mg/dL (ref 7–25)
CO2: 29 mmol/L (ref 20–32)
Calcium: 9.9 mg/dL (ref 8.6–10.3)
Chloride: 104 mmol/L (ref 98–110)
Creat: 1.04 mg/dL (ref 0.70–1.33)
GFR, Est African American: 91 mL/min/{1.73_m2} (ref >=60)
GFR, Est Non African American: 78 mL/min/{1.73_m2} (ref >=60)
Globulin: 2.4 g/dL (calc) (ref 1.9–3.7)
Glucose, Bld: 86 mg/dL (ref 65–99)
Potassium: 4.6 mmol/L (ref 3.5–5.3)
Sodium: 139 mmol/L (ref 135–146)
Total Bilirubin: 0.6 mg/dL (ref 0.2–1.2)
Total Protein: 6.8 g/dL (ref 6.1–8.1)

## 2020-04-12 LAB — CBC WITH DIFFERENTIAL/PLATELET
Absolute Monocytes: 507 cells/uL (ref 200–950)
Basophils Absolute: 39 cells/uL (ref 0–200)
Basophils Relative: 0.6 %
Eosinophils Absolute: 241 cells/uL (ref 15–500)
Eosinophils Relative: 3.7 %
HCT: 43.1 % (ref 38.5–50.0)
Hemoglobin: 15 g/dL (ref 13.2–17.1)
Lymphs Abs: 1794 cells/uL (ref 850–3900)
MCH: 31.3 pg (ref 27.0–33.0)
MCHC: 34.8 g/dL (ref 32.0–36.0)
MCV: 90 fL (ref 80.0–100.0)
MPV: 9.6 fL (ref 7.5–12.5)
Monocytes Relative: 7.8 %
Neutro Abs: 3920 cells/uL (ref 1500–7800)
Neutrophils Relative %: 60.3 %
Platelets: 242 10*3/uL (ref 140–400)
RBC: 4.79 10*6/uL (ref 4.20–5.80)
RDW: 12.6 % (ref 11.0–15.0)
Total Lymphocyte: 27.6 %
WBC: 6.5 10*3/uL (ref 3.8–10.8)

## 2020-04-12 LAB — TSH: TSH: 4.67 mIU/L — ABNORMAL HIGH (ref 0.40–4.50)

## 2020-04-12 LAB — LIPID PANEL
Cholesterol: 124 mg/dL (ref <200)
HDL: 45 mg/dL (ref >=40)
LDL Cholesterol (Calc): 59 mg/dL (calc)
Non-HDL Cholesterol (Calc): 79 mg/dL (calc) (ref <130)
Total CHOL/HDL Ratio: 2.8 (calc) (ref <5.0)
Triglycerides: 121 mg/dL (ref <150)

## 2020-05-02 ENCOUNTER — Other Ambulatory Visit: Payer: BC Managed Care – PPO

## 2020-05-09 ENCOUNTER — Ambulatory Visit: Payer: BC Managed Care – PPO | Admitting: Thoracic Surgery (Cardiothoracic Vascular Surgery)

## 2020-05-18 ENCOUNTER — Other Ambulatory Visit: Payer: Self-pay

## 2020-05-18 ENCOUNTER — Ambulatory Visit (HOSPITAL_COMMUNITY): Payer: BC Managed Care – PPO | Attending: Cardiology

## 2020-05-18 DIAGNOSIS — I350 Nonrheumatic aortic (valve) stenosis: Secondary | ICD-10-CM | POA: Insufficient documentation

## 2020-05-18 LAB — ECHOCARDIOGRAM COMPLETE
AR max vel: 1.5 cm2
AV Area VTI: 1.63 cm2
AV Area mean vel: 1.45 cm2
AV Mean grad: 19 mmHg
AV Peak grad: 33.6 mmHg
Ao pk vel: 2.9 m/s
Area-P 1/2: 2.44 cm2
S' Lateral: 1.4 cm

## 2020-05-22 ENCOUNTER — Inpatient Hospital Stay: Admission: RE | Admit: 2020-05-22 | Payer: BC Managed Care – PPO | Source: Ambulatory Visit

## 2020-05-23 ENCOUNTER — Ambulatory Visit: Payer: BC Managed Care – PPO | Admitting: Thoracic Surgery (Cardiothoracic Vascular Surgery)

## 2020-06-13 ENCOUNTER — Telehealth: Payer: Self-pay | Admitting: Internal Medicine

## 2020-06-13 DIAGNOSIS — I712 Thoracic aortic aneurysm, without rupture: Secondary | ICD-10-CM

## 2020-06-13 DIAGNOSIS — I7121 Aneurysm of the ascending aorta, without rupture: Secondary | ICD-10-CM

## 2020-06-13 NOTE — Telephone Encounter (Signed)
PLease put in referral for patient to see either Dr Cyndia Bent or Dr Kipp Brood  Pt having trouble sched with Dr Roxan Hockey

## 2020-06-14 NOTE — Telephone Encounter (Signed)
Referral placed to [facility], Dr. Kipp Brood, thoracic aortic aneurysm.  Dr. Harrington Challenger replied to pt MyChart message.

## 2020-06-14 NOTE — Addendum Note (Signed)
Addended by: Rodman Key on: 06/14/2020 10:07 AM   Modules accepted: Orders

## 2020-06-21 ENCOUNTER — Telehealth: Payer: Self-pay | Admitting: *Deleted

## 2020-06-21 NOTE — Telephone Encounter (Signed)
Left Message - left numerous vm for pt to return call to r/s CT's for f/u appt with Dr Roxan Hockey    By Ned Clines

## 2020-06-27 ENCOUNTER — Other Ambulatory Visit: Payer: BC Managed Care – PPO

## 2020-06-27 ENCOUNTER — Ambulatory Visit: Payer: BC Managed Care – PPO | Admitting: Thoracic Surgery (Cardiothoracic Vascular Surgery)

## 2020-07-01 NOTE — Progress Notes (Signed)
Annual  Screening/Preventative Visit  & Comprehensive Evaluation & Examination      This very nice 60 y.o. MWM presents for a Screening /Preventative Visit & comprehensive evaluation and management of multiple medical co-morbidities.  Patient has been followed for HTN, HLD, Hx/o Bicuspid AoV, Hypothyroidism,  Prediabetes and Vitamin D Deficiency. Patient has GERD controlled with diet & his Pepcid.       HTN predates 44. Patient's BP has been controlled at home.  Today's BP is at goal -  124/82. Patient  Has CKD-2 (GFR 64).  Patient has a bicuspid AoV and ascending Aortic aneurysm followed by Cardiologist, Dr Dorris Carnes and has upcoming CT angiograms in April.  Patient denies any cardiac symptoms as chest pain, palpitations, shortness of breath, dizziness or ankle swelling.      Patient's hyperlipidemia is controlled with diet and Atorvastatin.. Patient denies myalgias or other medication SE's. Last lipids were at goal:  Lab Results  Component Value Date   CHOL 124 04/11/2020   HDL 45 04/11/2020   LDLCALC 59 04/11/2020   TRIG 121 04/11/2020   CHOLHDL 2.8 04/11/2020       In 2011, patient was dx'd Hypothyroidism and initiated on replacement therapy.     Patient is monitored expectantly for glucose intolerance and patient denies reactive hypoglycemic symptoms, visual blurring, diabetic polys or paresthesias. Last A1c was normal & at goal:  Lab Results  Component Value Date   HGBA1C 5.2 01/10/2020        Finally, patient has history of Vitamin D Deficiency ("12" /2008) and last vitamin D was at goal:  Lab Results  Component Value Date   VD25OH 69 01/10/2020    Current Outpatient Medications on File Prior to Visit  Medication Sig  . amoxicillin (AMOXIL) 500 MG capsule Take 4 caps 1 hr before Dental Procedure for Bicuspid Aortic Valve  . VITAMIN C  Take daily.  Marland Kitchen aspirin EC 81 MG tablet Take daily.  Marland Kitchen atorvastatin  40 MG tablet Take 1 tablet Daily  for Cholesterol  . B  Complex Vitamins  Take by mouth daily.  . cetirizine  10 MG tablet Take 1 daily.  Marland Kitchen VITAMIN D 5,000 Units Take  daily.   . enalapril  20 MG tablet Take 1 tablet Daily  . famotidine  20 MG tablet Take  daily  . levothyroxine  50 MCG tablet Take 1 tablet daily   . OTC Flonase  PRN     Allergies  Allergen Reactions  . Propofol Shortness Of Breath    CHEST TIGHTNESS  . Zoloft [Sertraline Hcl] Other (See Comments)    HYPERACTIVE    Past Medical History:  Diagnosis Date  . Dental crowns present   . Heart murmur    states need for antibiotics prior to dental procedures; no cardiologist  . History of hepatitis A    age 46  . History of MRSA infection 2015   right great toe  . Hyperlipidemia   . Hypertension    states under control with med., has been on med. > 10 yr.  . Hypothyroidism   . Mass of skin of right shoulder 04/2015  . Neck mass 04/2015   posterior  . Seasonal allergies   . Vitamin D deficiency     Health Maintenance  Topic Date Due  . INFLUENZA VACCINE  Never done  . COLONOSCOPY  10/27/2023  . TETANUS/TDAP  10/28/2023  . COVID-19 Vaccine  Completed  . Hepatitis C Screening  Completed  .  HIV Screening  Completed  . HPV VACCINES  Aged Out    Immunization History  Administered Date(s) Administered  . PFIZERSARS-COV-2 Vaccination 07/08/2019, 07/31/2019, 04/01/2020  . PPD Test 02/22/2013, 02/24/2014, 03/08/2015, 04/18/2016, 06/04/2018, 06/24/2019  . Pneumococcal-23 12/16/2008  . Td 04/15/2005  . Tdap 10/27/2013    Last Colon - 07.09.2015 - Dr Deatra Ina - Recc 10 year f/u due July 2025   Past Surgical History:  Procedure Laterality Date  . COLONOSCOPY WITH PROPOFOL  10/21/2013  . EYE SURGERY Right 1966  . HYDROCELE EXCISION    . MASS EXCISION Right 04/26/2015   Procedure: EXCISION MASS NECK AND RIGHT SHOULDER ;  Surgeon: Johnathan Hausen, MD;  Location: [facility];  Service: General;  Laterality: Right;    Family History  Problem Relation Age  of Onset  . Arthritis Mother   . Depression Mother   . Migraines Mother   . Cancer Father        prostate  . Arthritis Father   . Ulcers Father   . Heart attack Father   . Colon cancer Maternal Uncle 6    Social History   Socioeconomic History  . Marital status: Married    Spouse name: Geni Bers     . Number of children: None  Occupational History  . Not on file  Tobacco Use  . Smoking status: Never Smoker  . Smokeless tobacco: Never Used  Substance and Sexual Activity  . Alcohol use: Yes    Comment: socially  . Drug use: No  . Sexual activity: Not on file    ROS Constitutional: Denies fever, chills, weight loss/gain, headaches, insomnia,  night sweats or change in appetite. Does c/o fatigue. Eyes: Denies redness, blurred vision, diplopia, discharge, itchy or watery eyes.  ENT: Denies discharge, congestion, post nasal drip, epistaxis, sore throat, earache, hearing loss, dental pain, Tinnitus, Vertigo, Sinus pain or snoring.  Cardio: Denies chest pain, palpitations, irregular heartbeat, syncope, dyspnea, diaphoresis, orthopnea, PND, claudication or edema Respiratory: denies cough, dyspnea, DOE, pleurisy, hoarseness, laryngitis or wheezing.  Gastrointestinal: Denies dysphagia, heartburn, reflux, water brash, pain, cramps, nausea, vomiting, bloating, diarrhea, constipation, hematemesis, melena, hematochezia, jaundice or hemorrhoids Genitourinary: Denies dysuria, frequency, urgency, nocturia, hesitancy, discharge, hematuria or flank pain Musculoskeletal: Denies arthralgia, myalgia, stiffness, Jt. Swelling, pain, limp or strain/sprain. Denies Falls. Skin: Denies puritis, rash, hives, warts, acne, eczema or change in skin lesion Neuro: No weakness, tremor, incoordination, spasms, paresthesia or pain Psychiatric: Denies confusion, memory loss or sensory loss. Denies Depression. Endocrine: Denies change in weight, skin, hair change, nocturia, and paresthesia, diabetic polys,  visual blurring or hyper / hypo glycemic episodes.  Heme/Lymph: No excessive bleeding, bruising or enlarged lymph nodes.  Physical Exam  BP 124/82   Pulse 66   Temp 97.7 F (36.5 C)   Resp 16   Ht 5\' 9"  (1.753 m)   Wt 169 lb 6.4 oz (76.8 kg)   SpO2 99%   BMI 25.02 kg/m   General Appearance: Well nourished and well groomed and in no apparent distress.  Eyes: PERRLA, EOMs, conjunctiva no swelling or erythema, normal fundi and vessels. Sinuses: No frontal/maxillary tenderness ENT/Mouth: EACs patent / TMs  nl. Nares clear without erythema, swelling, mucoid exudates. Oral hygiene is good. No erythema, swelling, or exudate. Tongue normal, non-obstructing. Tonsils not swollen or erythematous. Hearing normal.  Neck: Supple, thyroid not palpable. No bruits, nodes or JVD. Respiratory: Respiratory effort normal.  BS equal and clear bilateral without rales, rhonci, wheezing or stridor. Cardio: Heart sounds  are normal with regular rate and rhythm and Gr 2-3/4 sys murmur at the LSB. Peripheral pulses are normal and equal bilaterally without edema. No aortic or femoral bruits. Chest: symmetric with normal excursions and percussion.  Abdomen: Soft, with Nl bowel sounds. Nontender, no guarding, rebound, hernias, masses, or organomegaly.  Lymphatics: Non tender without lymphadenopathy.  Musculoskeletal: Full ROM all peripheral extremities, joint stability, 5/5 strength, and normal gait. Skin: Warm and dry without rashes, lesions, cyanosis, clubbing or  ecchymosis.  Neuro: Cranial nerves intact, reflexes equal bilaterally. Normal muscle tone, no cerebellar symptoms. Sensation intact.  Pysch: Alert and oriented x 3 with normal affect, insight and judgment appropriate.   Assessment and Plan  1. Annual Preventative/Screening Exam    2. Essential hypertension  - EKG 12-Lead - Urinalysis, Routine w reflex microscopic - Microalbumin / creatinine urine ratio - CBC with Differential/Platelet -  COMPLETE METABOLIC PANEL WITH GFR - Magnesium - TSH  3. Hyperlipidemia, mixed  - EKG 12-Lead - Lipid panel - TSH  4. Abnormal glucose  - EKG 12-Lead - Hemoglobin A1c - Insulin, random  5. Vitamin D deficiency  - VITAMIN D 25 Hydroxy  6. Bicuspid aortic valve  - EKG 12-Lead  7. Ascending aortic aneurysm (HCC)  - EKG 12-Lead  8. Hypothyroidism, unspecified type  - TSH  9. CKD (chronic kidney disease) stage 2, GFR 60-89 ml/min   10. Prostate cancer screening  - PSA  11. Screening for colorectal cancer  - POC Hemoccult Bld/Stl   12. Screening-pulmonary TB  - TB Skin Test  13. Screening for ischemic heart disease  - EKG 12-Lead  14. FHx: heart disease  - EKG 12-Lead  15. Fatigue, unspecified type  - Iron,Total/Total Iron Binding Cap - Vitamin B12 - Testosterone - CBC with Differential/Platelet - TSH  16. Medication management  - Urinalysis, Routine w reflex microscopic - Microalbumin / creatinine urine ratio - CBC with Differential/Platelet - COMPLETE METABOLIC PANEL WITH GFR - Magnesium - Lipid panel - TSH - Hemoglobin A1c - Insulin, random - VITAMIN D 25 Hydroxy          Patient was counseled in prudent diet, weight control to achieve/maintain BMI less than 25, BP monitoring, regular exercise and medications as discussed.  Discussed med effects and SE's. Routine screening labs and tests as requested with regular follow-up as recommended. Over 40 minutes of exam, counseling, chart review and high complex critical decision making was performed   Kirtland Bouchard, MD

## 2020-07-03 ENCOUNTER — Encounter: Payer: Self-pay | Admitting: Internal Medicine

## 2020-07-03 NOTE — Patient Instructions (Signed)
Due to recent changes in healthcare laws, you may see the results of your imaging and laboratory studies on MyChart before your provider has had a chance to review them.  We understand that in some cases there may be results that are confusing or concerning to you. Not all laboratory results come back in the same time frame and the provider may be waiting for multiple results in order to interpret others.  Please give us 48 hours in order for your provider to thoroughly review all the results before contacting the office for clarification of your results.   ++++++++++++++++++++++++++++++++++++++  Vit D  & Vit C 1,000 mg   are recommended to help protect  against the Covid-19 and other Corona viruses.    Also it's recommended  to take  Zinc 50 mg  to help  protect against the Covid-19   and [facility] place to get  is also on [employer]  and don't pay more than 6-8 cents /pill !  =============================== Coronavirus (COVID-19) Are you at risk?  Are you at risk for the Coronavirus (COVID-19)?  To be considered HIGH RISK for Coronavirus (COVID-19), you have to meet the following criteria:  . Traveled to [place], [place], [place], [place] or [place]; or in the [place] to [place], [place], [place]  . or [place]; and have fever, cough, and shortness of breath within the last 2 weeks of travel OR . Been in close contact with a person diagnosed with COVID-19 within the last 2 weeks and have  . fever, cough,and shortness of breath .  . IF YOU DO NOT MEET THESE CRITERIA, YOU ARE CONSIDERED LOW RISK FOR COVID-19.  What to do if you are HIGH RISK for COVID-19?  . If you are having a medical emergency, call 911. . Seek medical care right away. Before you go to a doctor's office, urgent care or emergency department, .  call ahead and tell them about your recent travel, contact with someone diagnosed with COVID-19  .  and your symptoms.  . You should receive instructions from your  physician's office regarding next steps of care.  . When you arrive at healthcare provider, tell the healthcare staff immediately you have returned from  . visiting [place], [place], [place], [place] or [place]; or traveled in the [place] to [place], [place],  . [place] or [place] in the last two weeks or you have been in close contact with a person diagnosed with  . COVID-19 in the last 2 weeks.   . Tell the health care staff about your symptoms: fever, cough and shortness of breath. . After you have been seen by a medical provider, you will be either: o Tested for (COVID-19) and discharged home on quarantine except to seek medical care if  o symptoms worsen, and asked to  - Stay home and avoid contact with others until you get your results (4-5 days)  - Avoid travel on public transportation if possible (such as bus, train, or airplane) or o Sent to the Emergency Department by EMS for evaluation, COVID-19 testing  and  o possible admission depending on your condition and test results.  What to do if you are LOW RISK for COVID-19?  Reduce your risk of any infection by using the same precautions used for avoiding the common cold or flu:  . Wash your hands often with soap and warm water for at least 20 seconds.  If soap and water are not readily   available,  . use an alcohol-based hand sanitizer with at least 60% alcohol.  . If coughing or sneezing, cover your mouth and nose by coughing or sneezing into the elbow areas of your shirt or coat, .  into a tissue or into your sleeve (not your hands). . Avoid shaking hands with others and consider head nods or verbal greetings only. . Avoid touching your eyes, nose, or mouth with unwashed hands.  . Avoid close contact with people who are sick. . Avoid places or events with large numbers of people in one location, like concerts or sporting events. . Carefully consider travel plans you have or are making. . If you are planning any travel  outside or inside the [place], visit the CDC's Travelers' Health webpage for the latest health notices. . If you have some symptoms but not all symptoms, continue to monitor at home and seek medical attention  . if your symptoms worsen. . If you are having a medical emergency, call 911. >>>>>>>>>>>>>>>>>>>>>>>>>>>> Preventive Care for Adults  A healthy lifestyle and preventive care can promote health and wellness. Preventive health guidelines for men include the following key practices:  A routine yearly physical is a good way to check with your health care provider about your health and preventative screening. It is a chance to share any concerns and updates on your health and to receive a thorough exam.  Visit your dentist for a routine exam and preventative care every 6 months. Brush your teeth twice a day and floss once a day. Good oral hygiene prevents tooth decay and gum disease.  The frequency of eye exams is based on your age, health, family medical history, use of contact lenses, and other factors. Follow your health care provider's recommendations for frequency of eye exams.  Eat a healthy diet. Foods such as vegetables, fruits, whole grains, low-fat dairy products, and lean protein foods contain the nutrients you need without too many calories. Decrease your intake of foods high in solid fats, added sugars, and salt. Eat the right amount of calories for you. Get information about a proper diet from your health care provider, if necessary.  Regular physical exercise is one of the most important things you can do for your health. Most adults should get at least 150 minutes of moderate-intensity exercise (any activity that increases your heart rate and causes you to sweat) each week. In addition, most adults need muscle-strengthening exercises on 2 or more days a week.  Maintain a healthy weight. The body mass index (BMI) is a screening tool to identify possible weight problems. It provides an  estimate of body fat based on height and weight. Your health care provider can find your BMI and can help you achieve or maintain a healthy weight. For adults 20 years and older:  A BMI below 18.5 is considered underweight.  A BMI of 18.5 to 24.9 is normal.  A BMI of 25 to 29.9 is considered overweight.  A BMI of 30 and above is considered obese.  Maintain normal blood lipids and cholesterol levels by exercising and minimizing your intake of saturated fat. Eat a balanced diet with plenty of fruit and vegetables. Blood tests for lipids and cholesterol should begin at age 20 and be repeated every 5 years. If your lipid or cholesterol levels are high, you are over 50, or you are at high risk for heart disease, you may need your cholesterol levels checked more frequently. Ongoing high lipid and cholesterol levels should be treated   with medicines if diet and exercise are not working.  If you smoke, find out from your health care provider how to quit. If you do not use tobacco, do not start.  Lung cancer screening is recommended for adults aged 55-80 years who are at high risk for developing lung cancer because of a history of smoking. A yearly low-dose CT scan of the lungs is recommended for people who have at least a 30-pack-year history of smoking and are a current smoker or have quit within the past 15 years. A pack year of smoking is smoking an average of 1 pack of cigarettes a day for 1 year (for example: 1 pack a day for 30 years or 2 packs a day for 15 years). Yearly screening should continue until the smoker has stopped smoking for at least 15 years. Yearly screening should be stopped for people who develop a health problem that would prevent them from having lung cancer treatment.  If you choose to drink alcohol, do not have more than 2 drinks per day. One drink is considered to be 12 ounces (355 mL) of beer, 5 ounces (148 mL) of wine, or 1.5 ounces (44 mL) of liquor.  Avoid use of street  drugs. Do not share needles with anyone. Ask for help if you need support or instructions about stopping the use of drugs.  High blood pressure causes heart disease and increases the risk of stroke. Your blood pressure should be checked at least every 1-2 years. Ongoing high blood pressure should be treated with medicines, if weight loss and exercise are not effective.  If you are 45-79 years old, ask your health care provider if you should take aspirin to prevent heart disease.  Diabetes screening involves taking a blood sample to check your fasting blood sugar level. This should be done once every 3 years, after age 45, if you are within normal weight and without risk factors for diabetes. Testing should be considered at a younger age or be carried out more frequently if you are overweight and have at least 1 risk factor for diabetes.  Colorectal cancer can be detected and often prevented. Most routine colorectal cancer screening begins at the age of 50 and continues through age 75. However, your health care provider may recommend screening at an earlier age if you have risk factors for colon cancer. On a yearly basis, your health care provider may provide home test kits to check for hidden blood in the stool. Use of a small camera at the end of a tube to directly examine the colon (sigmoidoscopy or colonoscopy) can detect the earliest forms of colorectal cancer. Talk to your health care provider about this at age 50, when routine screening begins. Direct exam of the colon should be repeated every 5-10 years through age 75, unless early forms of precancerous polyps or small growths are found.   Talk with your health care provider about prostate cancer screening.  Testicular cancer screening isrecommended for adult males. Screening includes self-exam, a health care provider exam, and other screening tests. Consult with your health care provider about any symptoms you have or any concerns you have about  testicular cancer.  Use sunscreen. Apply sunscreen liberally and repeatedly throughout the day. You should seek shade when your shadow is shorter than you. Protect yourself by wearing long sleeves, pants, a wide-brimmed hat, and sunglasses year round, whenever you are outdoors.  Once a month, do a whole-body skin exam, using a mirror to look at   the skin on your back. Tell your health care provider about new moles, moles that have irregular borders, moles that are larger than a pencil eraser, or moles that have changed in shape or color.  Stay current with required vaccines (immunizations).  Influenza vaccine. All adults should be immunized every year.  Tetanus, diphtheria, and acellular pertussis (Td, Tdap) vaccine. An adult who has not previously received Tdap or who does not know his vaccine status should receive 1 dose of Tdap. This initial dose should be followed by tetanus and diphtheria toxoids (Td) booster doses every 10 years. Adults with an unknown or incomplete history of completing a 3-dose immunization series with Td-containing vaccines should begin or complete a primary immunization series including a Tdap dose. Adults should receive a Td booster every 10 years.  Varicella vaccine. An adult without evidence of immunity to varicella should receive 2 doses or a second dose if he has previously received 1 dose.  Human papillomavirus (HPV) vaccine. Males aged 13-21 years who have not received the vaccine previously should receive the 3-dose series. Males aged 22-26 years may be immunized. Immunization is recommended through the age of 26 years for any male who has sex with males and did not get any or all doses earlier. Immunization is recommended for any person with an immunocompromised condition through the age of 26 years if he did not get any or all doses earlier. During the 3-dose series, the second dose should be obtained 4-8 weeks after the first dose. The third dose should be obtained  24 weeks after the first dose and 16 weeks after the second dose.  Zoster vaccine. One dose is recommended for adults aged 60 years or older unless certain conditions are present.    PREVNAR  - Pneumococcal 13-valent conjugate (PCV13) vaccine. When indicated, a person who is uncertain of his immunization history and has no record of immunization should receive the PCV13 vaccine. An adult aged 19 years or older who has certain medical conditions and has not been previously immunized should receive 1 dose of PCV13 vaccine. This PCV13 should be followed with a dose of pneumococcal polysaccharide (PPSV23) vaccine. The PPSV23 vaccine dose should be obtained at least 1 r more year(s) after the dose of PCV13 vaccine. An adult aged 19 years or older who has certain medical conditions and previously received 1 or more doses of PPSV23 vaccine should receive 1 dose of PCV13. The PCV13 vaccine dose should be obtained 1 or more years after the last PPSV23 vaccine dose.    PNEUMOVAX - Pneumococcal polysaccharide (PPSV23) vaccine. When PCV13 is also indicated, PCV13 should be obtained first. All adults aged 65 years and older should be immunized. An adult younger than age 65 years who has certain medical conditions should be immunized. Any person who resides in a nursing home or long-term care facility should be immunized. An adult smoker should be immunized. People with an immunocompromised condition and certain other conditions should receive both PCV13 and PPSV23 vaccines. People with human immunodeficiency virus (HIV) infection should be immunized as soon as possible after diagnosis. Immunization during chemotherapy or radiation therapy should be avoided. Routine use of PPSV23 vaccine is not recommended for American Indians, [place] Natives, or people younger than 65 years unless there are medical conditions that require PPSV23 vaccine. When indicated, people who have unknown immunization and have no record of  immunization should receive PPSV23 vaccine. One-time revaccination 5 years after the first dose of PPSV23 is recommended for people aged   19-64 years who have chronic kidney failure, nephrotic syndrome, asplenia, or immunocompromised conditions. People who received 1-2 doses of PPSV23 before age 65 years should receive another dose of PPSV23 vaccine at age 65 years or later if at least 5 years have passed since the previous dose. Doses of PPSV23 are not needed for people immunized with PPSV23 at or after age 65 years.    Hepatitis A vaccine. Adults who wish to be protected from this disease, have certain high-risk conditions, work with hepatitis A-infected animals, work in hepatitis A research labs, or travel to or work in countries with a high rate of hepatitis A should be immunized. Adults who were previously unvaccinated and who anticipate close contact with an international adoptee during the first 60 days after arrival in the [place] from a country with a high rate of hepatitis A should be immunized.    Hepatitis B vaccine. Adults should be immunized if they wish to be protected from this disease, have certain high-risk conditions, may be exposed to blood or other infectious body fluids, are household contacts or sex partners of hepatitis B positive people, are clients or workers in certain care facilities, or travel to or work in countries with a high rate of hepatitis B.   Preventive Service / Frequency   Ages 40 to 64  Blood pressure check.  Lipid and cholesterol check  Lung cancer screening. / Every year if you are aged 55-80 years and have a 30-pack-year history of smoking and currently smoke or have quit within the past 15 years. Yearly screening is stopped once you have quit smoking for at least 15 years or develop a health problem that would prevent you from having lung cancer treatment.  Fecal occult blood test (FOBT) of stool. / Every year beginning at age 50 and continuing  until age 75. You may not have to do this test if you get a colonoscopy every 10 years.  Flexible sigmoidoscopy** or colonoscopy.** / Every 5 years for a flexible sigmoidoscopy or every 10 years for a colonoscopy beginning at age 50 and continuing until age 75. Screening for abdominal aortic aneurysm (AAA)  by ultrasound is recommended for people who have history of high blood pressure or who are current or former smokers. +++++++++++ Recommend Adult Low Dose Aspirin or  coated  Aspirin 81 mg daily  To reduce risk of Colon Cancer 40 %,  Skin Cancer 26 % ,  Malignant Melanoma 46%  and  Pancreatic cancer 60% ++++++++++++++++++++ Vitamin D goal  is between 70-100.  Please make sure that you are taking your Vitamin D as directed.  It is very important as a natural anti-inflammatory  helping hair, skin, and nails, as well as reducing stroke and heart attack risk.  It helps your bones and helps with mood. It also decreases numerous cancer risks so please take it as directed.  Low Vit D is associated with a 200-300% higher risk for CANCER  and 200-300% higher risk for HEART   ATTACK  &  STROKE.   ...................................... It is also associated with higher death rate at younger ages,  autoimmune diseases like Rheumatoid arthritis, Lupus, Multiple Sclerosis.    Also many other serious conditions, like depression, Alzheimer's Dementia, infertility, muscle aches, fatigue, fibromyalgia - just to name a few. +++++++++++++++++++++ Recommend the book "The END of DIETING" by Dr Joel Fuhrman  & the book "The END of DIABETES " by Dr Joel Fuhrman At [employer] - get book & Audio CD's      Being diabetic has a  300% increased risk for heart attack, stroke, cancer, and alzheimer- type vascular dementia. It is very important that you work harder with diet by avoiding all foods that are white. Avoid white rice (brown & wild rice is OK), white potatoes (sweetpotatoes in moderation is OK), White  bread or wheat bread or anything made out of white flour like bagels, donuts, rolls, buns, biscuits, cakes, pastries, cookies, pizza crust, and pasta (made from white flour & egg whites) - vegetarian pasta or spinach or wheat pasta is OK. Multigrain breads like Arnold's or Pepper[employer], or multigrain sandwich thins or flatbreads.  Diet, exercise and weight loss can reverse and cure diabetes in the early stages.  Diet, exercise and weight loss is very important in the control and prevention of complications of diabetes which affects every system in your body, ie. Brain - dementia/stroke, eyes - glaucoma/blindness, heart - heart attack/heart failure, kidneys - dialysis, stomach - gastric paralysis, intestines - malabsorption, nerves - severe painful neuritis, circulation - gangrene & loss of a leg(s), and finally cancer and Alzheimers.    I recommend avoid fried & greasy foods,  sweets/candy, white rice (brown or wild rice or Quinoa is OK), white potatoes (sweet potatoes are OK) - anything made from white flour - bagels, doughnuts, rolls, buns, biscuits,white and wheat breads, pizza crust and traditional pasta made of white flour & egg white(vegetarian pasta or spinach or wheat pasta is OK).  Multi-grain bread is OK - like multi-grain flat bread or sandwich thins. Avoid alcohol in excess. Exercise is also important.    Eat all the vegetables you want - avoid meat, especially red meat and dairy - especially cheese.  Cheese is the most concentrated form of trans-fats which is the worst thing to clog up our arteries. Veggie cheese is OK which can be found in the fresh produce section at [employer] or [employer] or [employer]  ++++++++++++++++++++++ DASH Eating Plan  DASH stands for "Dietary Approaches to Stop Hypertension."   The DASH eating plan is a healthy eating plan that has been shown to reduce high blood pressure (hypertension). Additional health benefits may include reducing the risk of type 2  diabetes mellitus, heart disease, and stroke. The DASH eating plan may also help with weight loss. WHAT DO I NEED TO KNOW ABOUT THE DASH EATING PLAN? For the DASH eating plan, you will follow these general guidelines:  Choose foods with a percent daily value for sodium of less than 5% (as listed on the food label).  Use salt-free seasonings or herbs instead of table salt or sea salt.  Check with your health care provider or pharmacist before using salt substitutes.  Eat lower-sodium products, often labeled as "lower sodium" or "no salt added."  Eat fresh foods.  Eat more vegetables, fruits, and low-fat dairy products.  Choose whole grains. Look for the word "whole" as the first word in the ingredient list.  Choose fish   Limit sweets, desserts, sugars, and sugary drinks.  Choose heart-healthy fats.  Eat veggie cheese   Eat more home-cooked food and less restaurant, buffet, and fast food.  Limit fried foods.  Cook foods using methods other than frying.  Limit canned vegetables. If you do use them, rinse them well to decrease the sodium.  When eating at a restaurant, ask that your food be prepared with less salt, or no salt if possible.                        WHAT FOODS CAN I EAT? Read Dr Joel Fuhrman's books on The End of Dieting & The End of Diabetes  Grains Whole grain or whole wheat bread. Brown rice. Whole grain or whole wheat pasta. Quinoa, bulgur, and whole grain cereals. Low-sodium cereals. Corn or whole wheat flour tortillas. Whole grain cornbread. Whole grain crackers. Low-sodium crackers.  Vegetables Fresh or frozen vegetables (raw, steamed, roasted, or grilled). Low-sodium or reduced-sodium tomato and vegetable juices. Low-sodium or reduced-sodium tomato sauce and paste. Low-sodium or reduced-sodium canned vegetables.   Fruits All fresh, canned (in natural juice), or frozen fruits.  Protein Products  All fish and seafood.  Dried beans, peas, or lentils.  Unsalted nuts and seeds. Unsalted canned beans.  Dairy Low-fat dairy products, such as skim or 1% milk, 2% or reduced-fat cheeses, low-fat ricotta or cottage cheese, or plain low-fat yogurt. Low-sodium or reduced-sodium cheeses.  Fats and Oils Tub margarines without trans fats. Light or reduced-fat mayonnaise and salad dressings (reduced sodium). Avocado. Safflower, olive, or canola oils. Natural peanut or almond butter.  Other Unsalted popcorn and pretzels. The items listed above may not be a complete list of recommended foods or beverages. Contact your dietitian for more options.  +++++++++++++++++++  WHAT FOODS ARE NOT RECOMMENDED? Grains/ White flour or wheat flour White bread. White pasta. White rice. Refined cornbread. Bagels and croissants. Crackers that contain trans fat.  Vegetables  Creamed or fried vegetables. Vegetables in a . Regular canned vegetables. Regular canned tomato sauce and paste. Regular tomato and vegetable juices.  Fruits Dried fruits. Canned fruit in light or heavy syrup. Fruit juice.  Meat and Other Protein Products Meat in general - RED meat & White meat.  Fatty cuts of meat. Ribs, chicken wings, all processed meats as bacon, sausage, bologna, salami, fatback, hot dogs, bratwurst and packaged luncheon meats.  Dairy Whole or 2% milk, cream, half-and-half, and cream cheese. Whole-fat or sweetened yogurt. Full-fat cheeses or blue cheese. Non-dairy creamers and whipped toppings. Processed cheese, cheese spreads, or cheese curds.  Condiments Onion and garlic salt, seasoned salt, table salt, and sea salt. Canned and packaged gravies. Worcestershire sauce. Tartar sauce. Barbecue sauce. Teriyaki sauce. Soy sauce, including reduced sodium. Steak sauce. Fish sauce. Oyster sauce. Cocktail sauce. Horseradish. Ketchup and mustard. Meat flavorings and tenderizers. Bouillon cubes. Hot sauce. Tabasco sauce. Marinades. Taco seasonings. Relishes.  Fats and Oils Butter,  stick margarine, lard, shortening and bacon fat. Coconut, palm kernel, or palm oils. Regular salad dressings.  Pickles and olives. Salted popcorn and pretzels.  The items listed above may not be a complete list of foods and beverages to avoid.    

## 2020-07-04 ENCOUNTER — Ambulatory Visit (INDEPENDENT_AMBULATORY_CARE_PROVIDER_SITE_OTHER): Payer: BC Managed Care – PPO | Admitting: Internal Medicine

## 2020-07-04 ENCOUNTER — Other Ambulatory Visit: Payer: Self-pay

## 2020-07-04 VITALS — BP 124/82 | HR 66 | Temp 97.7°F | Resp 16 | Ht 69.0 in | Wt 169.4 lb

## 2020-07-04 DIAGNOSIS — Z1211 Encounter for screening for malignant neoplasm of colon: Secondary | ICD-10-CM

## 2020-07-04 DIAGNOSIS — R350 Frequency of micturition: Secondary | ICD-10-CM | POA: Diagnosis not present

## 2020-07-04 DIAGNOSIS — Z131 Encounter for screening for diabetes mellitus: Secondary | ICD-10-CM

## 2020-07-04 DIAGNOSIS — Z79899 Other long term (current) drug therapy: Secondary | ICD-10-CM

## 2020-07-04 DIAGNOSIS — Z1389 Encounter for screening for other disorder: Secondary | ICD-10-CM

## 2020-07-04 DIAGNOSIS — R7309 Other abnormal glucose: Secondary | ICD-10-CM

## 2020-07-04 DIAGNOSIS — Z125 Encounter for screening for malignant neoplasm of prostate: Secondary | ICD-10-CM

## 2020-07-04 DIAGNOSIS — N182 Chronic kidney disease, stage 2 (mild): Secondary | ICD-10-CM

## 2020-07-04 DIAGNOSIS — E039 Hypothyroidism, unspecified: Secondary | ICD-10-CM

## 2020-07-04 DIAGNOSIS — E559 Vitamin D deficiency, unspecified: Secondary | ICD-10-CM | POA: Diagnosis not present

## 2020-07-04 DIAGNOSIS — I10 Essential (primary) hypertension: Secondary | ICD-10-CM

## 2020-07-04 DIAGNOSIS — I7121 Aneurysm of the ascending aorta, without rupture: Secondary | ICD-10-CM

## 2020-07-04 DIAGNOSIS — R5383 Other fatigue: Secondary | ICD-10-CM

## 2020-07-04 DIAGNOSIS — Z111 Encounter for screening for respiratory tuberculosis: Secondary | ICD-10-CM | POA: Diagnosis not present

## 2020-07-04 DIAGNOSIS — Z130 Encounter for screening for diseases of the blood and blood-forming organs and certain disorders involving the immune mechanism: Secondary | ICD-10-CM

## 2020-07-04 DIAGNOSIS — Q231 Congenital insufficiency of aortic valve: Secondary | ICD-10-CM

## 2020-07-04 DIAGNOSIS — Z8249 Family history of ischemic heart disease and other diseases of the circulatory system: Secondary | ICD-10-CM

## 2020-07-04 DIAGNOSIS — Z0000 Encounter for general adult medical examination without abnormal findings: Secondary | ICD-10-CM | POA: Diagnosis not present

## 2020-07-04 DIAGNOSIS — Z1329 Encounter for screening for other suspected endocrine disorder: Secondary | ICD-10-CM | POA: Diagnosis not present

## 2020-07-04 DIAGNOSIS — N401 Enlarged prostate with lower urinary tract symptoms: Secondary | ICD-10-CM

## 2020-07-04 DIAGNOSIS — Z136 Encounter for screening for cardiovascular disorders: Secondary | ICD-10-CM

## 2020-07-04 DIAGNOSIS — E782 Mixed hyperlipidemia: Secondary | ICD-10-CM

## 2020-07-04 DIAGNOSIS — Z13220 Encounter for screening for lipoid disorders: Secondary | ICD-10-CM | POA: Diagnosis not present

## 2020-07-04 DIAGNOSIS — I712 Thoracic aortic aneurysm, without rupture: Secondary | ICD-10-CM

## 2020-07-04 DIAGNOSIS — Z0001 Encounter for general adult medical examination with abnormal findings: Secondary | ICD-10-CM

## 2020-07-04 MED ORDER — LEVOTHYROXINE SODIUM 50 MCG PO TABS: ORAL_TABLET | ORAL | 1 refills | Status: DC

## 2020-07-04 MED ORDER — ENALAPRIL MALEATE 20 MG PO TABS: ORAL_TABLET | ORAL | 1 refills | Status: DC

## 2020-07-05 LAB — LIPID PANEL
Cholesterol: 119 mg/dL (ref <200)
HDL: 47 mg/dL (ref >=40)
LDL Cholesterol (Calc): 52 mg/dL (calc)
Non-HDL Cholesterol (Calc): 72 mg/dL (calc) (ref <130)
Total CHOL/HDL Ratio: 2.5 (calc) (ref <5.0)
Triglycerides: 121 mg/dL (ref <150)

## 2020-07-05 LAB — HEMOGLOBIN A1C
Hgb A1c MFr Bld: 5.2 % of total Hgb (ref <5.7)
Mean Plasma Glucose: 103 mg/dL
eAG (mmol/L): 5.7 mmol/L

## 2020-07-05 LAB — COMPLETE METABOLIC PANEL WITH GFR
AG Ratio: 1.8 (calc) (ref 1.0–2.5)
ALT: 34 U/L (ref 9–46)
AST: 26 U/L (ref 10–35)
Albumin: 4.3 g/dL (ref 3.6–5.1)
Alkaline phosphatase (APISO): 78 U/L (ref 35–144)
BUN: 18 mg/dL (ref 7–25)
CO2: 30 mmol/L (ref 20–32)
Calcium: 10.1 mg/dL (ref 8.6–10.3)
Chloride: 105 mmol/L (ref 98–110)
Creat: 0.98 mg/dL (ref 0.70–1.33)
GFR, Est African American: 97 mL/min/{1.73_m2} (ref >=60)
GFR, Est Non African American: 84 mL/min/{1.73_m2} (ref >=60)
Globulin: 2.4 g/dL (calc) (ref 1.9–3.7)
Glucose, Bld: 69 mg/dL (ref 65–99)
Potassium: 4.3 mmol/L (ref 3.5–5.3)
Sodium: 142 mmol/L (ref 135–146)
Total Bilirubin: 0.6 mg/dL (ref 0.2–1.2)
Total Protein: 6.7 g/dL (ref 6.1–8.1)

## 2020-07-05 LAB — IRON, TOTAL/TOTAL IRON BINDING CAP
%SAT: 27 % (calc) (ref 20–48)
Iron: 80 ug/dL (ref 50–180)
TIBC: 292 mcg/dL (calc) (ref 250–425)

## 2020-07-05 LAB — MICROALBUMIN / CREATININE URINE RATIO
Creatinine, Urine: 115 mg/dL (ref 20–320)
Microalb Creat Ratio: 3 mcg/mg creat (ref <30)
Microalb, Ur: 0.3 mg/dL

## 2020-07-05 LAB — TSH: TSH: 5.26 mIU/L — ABNORMAL HIGH (ref 0.40–4.50)

## 2020-07-05 LAB — VITAMIN B12: Vitamin B-12: 561 pg/mL (ref 200–1100)

## 2020-07-05 LAB — CBC WITH DIFFERENTIAL/PLATELET
Absolute Monocytes: 448 cells/uL (ref 200–950)
Basophils Absolute: 32 cells/uL (ref 0–200)
Basophils Relative: 0.5 %
Eosinophils Absolute: 282 cells/uL (ref 15–500)
Eosinophils Relative: 4.4 %
HCT: 45.6 % (ref 38.5–50.0)
Hemoglobin: 15.3 g/dL (ref 13.2–17.1)
Lymphs Abs: 1587 cells/uL (ref 850–3900)
MCH: 30.4 pg (ref 27.0–33.0)
MCHC: 33.6 g/dL (ref 32.0–36.0)
MCV: 90.5 fL (ref 80.0–100.0)
MPV: 9.3 fL (ref 7.5–12.5)
Monocytes Relative: 7 %
Neutro Abs: 4051 cells/uL (ref 1500–7800)
Neutrophils Relative %: 63.3 %
Platelets: 241 10*3/uL (ref 140–400)
RBC: 5.04 10*6/uL (ref 4.20–5.80)
RDW: 12.8 % (ref 11.0–15.0)
Total Lymphocyte: 24.8 %
WBC: 6.4 10*3/uL (ref 3.8–10.8)

## 2020-07-05 LAB — URINALYSIS, ROUTINE W REFLEX MICROSCOPIC
Bilirubin Urine: NEGATIVE
Glucose, UA: NEGATIVE
Hgb urine dipstick: NEGATIVE
Ketones, ur: NEGATIVE
Leukocytes,Ua: NEGATIVE
Nitrite: NEGATIVE
Protein, ur: NEGATIVE
Specific Gravity, Urine: 1.019 (ref 1.001–1.03)
pH: <=5 (ref 5.0–8.0)

## 2020-07-05 LAB — INSULIN, RANDOM: Insulin: 12.9 u[IU]/mL

## 2020-07-05 LAB — PSA: PSA: 0.61 ng/mL (ref <=4.0)

## 2020-07-05 LAB — TESTOSTERONE: Testosterone: 435 ng/dL (ref 250–827)

## 2020-07-05 LAB — MAGNESIUM: Magnesium: 2 mg/dL (ref 1.5–2.5)

## 2020-07-05 LAB — VITAMIN D 25 HYDROXY (VIT D DEFICIENCY, FRACTURES): Vit D, 25-Hydroxy: 77 ng/mL (ref 30–100)

## 2020-07-05 NOTE — Progress Notes (Signed)
============================================================ -   Test results slightly outside the reference range are not unusual. If there is anything important, I will review this with you,  otherwise it is considered normal test values.  If you have further questions,  please do not hesitate to contact me at the office or via My Chart.  ============================================================ ============================================================  -  Iron and Vitamin B12 levels -  Both  Normal & OK  ============================================================ ============================================================  - PSA - very Low - Great  ============================================================ ============================================================  - Testosterone - Normal  ============================================================ ============================================================  - Total Chol = 119  and LDL Chol = 52 - Both  Excellent   - Very low risk for Heart Attack  / Stroke ============================================================ ============================================================  - TSH (Thyroid test) is slightly elevated, which means                                                                   Thyroid hormone is slightly elevated   - So , Recommend take an extra 1/2 tablet 3 x /week on Mon  Wed  & Fri  Ie., Take 1 & 1/2 tablets 3 x /week on Mon - Wed - & Fri     and   take 1 tablet the other 4 days ============================================================ ============================================================  - Vitamin D = 77   -   Excellent ! ============================================================ ============================================================  - All Else - CBC - Kidneys - Electrolytes - Liver - Magnesium & Thyroid    - all  Normal /  OK ============================================================ ============================================================

## 2020-07-10 LAB — TB SKIN TEST
Induration: 0 mm
TB Skin Test: NEGATIVE

## 2020-07-18 ENCOUNTER — Other Ambulatory Visit: Payer: Self-pay

## 2020-07-18 ENCOUNTER — Ambulatory Visit
Admission: RE | Admit: 2020-07-18 | Discharge: 2020-07-18 | Disposition: A | Discharge status: Home / Self Care | Payer: BC Managed Care – PPO | Source: Ambulatory Visit | Attending: Thoracic Surgery (Cardiothoracic Vascular Surgery) | Admitting: Thoracic Surgery (Cardiothoracic Vascular Surgery)

## 2020-07-18 DIAGNOSIS — I712 Thoracic aortic aneurysm, without rupture, unspecified: Secondary | ICD-10-CM

## 2020-07-18 DIAGNOSIS — K5730 Diverticulosis of large intestine without perforation or abscess without bleeding: Secondary | ICD-10-CM | POA: Diagnosis not present

## 2020-07-18 DIAGNOSIS — I728 Aneurysm of other specified arteries: Secondary | ICD-10-CM | POA: Diagnosis not present

## 2020-07-18 DIAGNOSIS — I700 Atherosclerosis of aorta: Secondary | ICD-10-CM | POA: Diagnosis not present

## 2020-07-18 DIAGNOSIS — M4317 Spondylolisthesis, lumbosacral region: Secondary | ICD-10-CM | POA: Diagnosis not present

## 2020-07-18 MED ORDER — IOPAMIDOL (ISOVUE-370) INJECTION 76%
75.0000 mL | Freq: Once | INTRAVENOUS | Status: AC | PRN
  Administered 2020-07-18: 75 mL via INTRAVENOUS

## 2020-07-25 ENCOUNTER — Ambulatory Visit: Payer: BC Managed Care – PPO | Admitting: Thoracic Surgery (Cardiothoracic Vascular Surgery)

## 2020-07-25 ENCOUNTER — Other Ambulatory Visit: Payer: Self-pay

## 2020-07-25 ENCOUNTER — Encounter: Payer: Self-pay | Admitting: Thoracic Surgery (Cardiothoracic Vascular Surgery)

## 2020-07-25 VITALS — BP 124/78 | HR 78 | Temp 97.7°F | Ht 69.0 in | Wt 169.0 lb

## 2020-07-25 DIAGNOSIS — I712 Thoracic aortic aneurysm, without rupture: Secondary | ICD-10-CM | POA: Diagnosis not present

## 2020-07-25 DIAGNOSIS — I7121 Aneurysm of the ascending aorta, without rupture: Secondary | ICD-10-CM

## 2020-07-25 NOTE — Progress Notes (Signed)
[address]       [place] [address]             [phone]     HPI: Randy Allen returns for a scheduled follow-up visit regarding his ascending aneurysm.  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  Randy Allen has had a heart murmur since Randy Allen was a child.  Randy Allen was diagnosed with a bicuspid aortic valve in 1988.  Randy Allen presented with some mild shortness of breath with heavy exertion and saw Dr. Dorris Carnes.  A 2D echocardiogram showed moderate aortic stenosis with a valve area of 1.18 cm and a mean gradient of 25.  Randy Allen was noted to have a dilated ascending aorta.  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  I saw him in July 2021.  At that time Randy Allen was having some mild tightness in his chest and shortness of breath with heavy exertion such as lifting and carrying things.  Not with walking or routine activities.  In the interim since his last visit Randy Allen has been feeling well.  Randy Allen does still get this shortness of breath and tight sensation when doing very heavy exertion almost always when Randy Allen is lifting or carrying something.  Past Medical History:  Diagnosis Date  . Dental crowns present   . Heart murmur    states need for antibiotics prior to dental procedures; no cardiologist  . History of hepatitis A    age 19  . History of MRSA infection 2015   right great toe  . Hyperlipidemia   . Hypertension    states under control with med., has been on med. > 10 yr.  . Hypothyroidism   . Mass of skin of right shoulder 04/2015  . Neck mass 04/2015   posterior  . Seasonal allergies   . Vitamin D deficiency      Current Outpatient Medications  Medication Sig Dispense Refill  . amoxicillin (AMOXIL) 500 MG capsule Take 4 capsules 1 hour before Dental Procedure for Bicuspid Aortic Valve 20 capsule 0  . Ascorbic Acid (VITAMIN C PO)  Take by mouth daily.    Marland Kitchen aspirin EC 81 MG tablet Take 81 mg by mouth daily.    Marland Kitchen atorvastatin (LIPITOR) 40 MG tablet Take 1 tablet Daily  for Cholesterol 90 tablet 1  . B Complex Vitamins (VITAMIN B COMPLEX PO) Take by mouth daily.    . cetirizine (ZYRTEC) 10 MG tablet Take 10 mg by mouth daily. OTC    . Cholecalciferol (VITAMIN D PO) Take 5,000 Int'l Units by mouth daily.     . enalapril (VASOTEC) 20 MG tablet Take  1 tablet  Daily  for BP 90 tablet 1  . famotidine (PEPCID) 20 MG tablet Take 20 mg by mouth daily. OTC    . levothyroxine (SYNTHROID) 50 MCG tablet Take  1 tablet  Daily  on an empty stomach with only water for 30 minutes & no Antacid meds, Calcium or Magnesium for 4 hours & avoid Biotin 90 tablet 1  . OVER THE COUNTER MEDICATION OTC Flonase PRN     No current facility-administered medications for this visit.    Physical Exam BP 124/78 (BP Location: Left Arm, Patient Position: Sitting, Cuff Size: Normal)   Pulse 78   Temp 97.7 F (36.5 C) (Skin)   Ht 5\' 9"  (  1.753 m)   Wt 169 lb (76.7 kg)   SpO2 97% Comment: RA  BMI 24.22 kg/m  60 year old man in no acute distress Alert and oriented x3 with no focal deficits Lungs clear bilaterally with no rales or wheezes No carotid bruits Cardiac regular rate and rhythm with a 3/6 systolic murmur Pulses intact No peripheral edema  Diagnostic Tests: Echocardiogram 05/18/2020 IMPRESSIONS    1. Left ventricular ejection fraction, by estimation, is 60 to 65%. The  left ventricle has normal function. The left ventricle has no regional  wall motion abnormalities. Left ventricular diastolic parameters were  normal.  2. Right ventricular systolic function is normal. The right ventricular  size is normal.  3. The mitral valve is normal in structure. Trivial mitral valve  regurgitation. No evidence of mitral stenosis.  4. The aortic valve is bicuspid. Aortic valve regurgitation is mild. Mild  to moderate aortic valve stenosis.  Aortic valve area, by VTI measures 1.63  cm. Aortic valve mean gradient measures 19.0 mmHg.  5. Aortic dilatation noted. There is moderate to severe dilatation of the  ascending aorta, measuring 52 mm.  6. The inferior vena cava is normal in size with greater than 50%  respiratory variability, suggesting right atrial pressure of 3 mmHg.   Comparison(s): No significant change from prior study.   Conclusion(s)/Recommendation(s): Bicuspid aortic valve with mild AR and  mild-moderate AS and ascending aortic dilation to 5.2 cm. No significant  change from prior.   CT ANGIOGRAPHY CHEST, ABDOMEN AND PELVIS  TECHNIQUE: Multidetector CT imaging through the chest, abdomen and pelvis was performed using the standard protocol during bolus administration of intravenous contrast. Multiplanar reconstructed images and MIPs were obtained and reviewed to evaluate the vascular anatomy.  CONTRAST:  56mL ISOVUE-370 IOPAMIDOL (ISOVUE-370) INJECTION 76%  COMPARISON:  CT angiography dated 09/24/2019.  FINDINGS: CTA CHEST FINDINGS  Cardiovascular: There is no cardiomegaly or pericardial effusion. The ascending aorta measures approximately 5.1 cm in maximal axial diameter similar to prior CT. The aortic root measures approximately 4.6 cm similar to prior CT. No aortic dissection. No periaortic fluid collection or hematoma. The descending thoracic aorta demonstrates a normal caliber. No significant atherosclerotic disease. The origins of the great vessels of the aortic arch appear patent as visualized. No pulmonary artery embolus identified.  Mediastinum/Nodes: There is no hilar or mediastinal adenopathy. The esophagus and the thyroid gland are grossly unremarkable. No mediastinal fluid collection.  Lungs/Pleura: The lungs are clear. There is no pleural effusion pneumothorax. The central airways are patent.  Musculoskeletal: No chest wall abnormality. No acute or significant osseous  findings.  Review of the MIP images confirms the above findings.  CTA ABDOMEN AND PELVIS FINDINGS  VASCULAR  Aorta: Normal caliber aorta without aneurysm, dissection, vasculitis or significant stenosis.  Celiac: The celiac axis and its major branches are patent. 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  SMA: Stable appearing focal aneurysmal dilatation of the proximal SMA measuring 9 mm similar to prior CT. The SMA is patent.  Renals: Both renal arteries are patent without evidence of aneurysm, dissection, vasculitis, fibromuscular dysplasia or significant stenosis.  IMA: Patent without evidence of aneurysm, dissection, vasculitis or significant stenosis.  Inflow: Mild atherosclerotic calcification. 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 The iliac arteries are otherwise patent.  Veins: No obvious venous abnormality within the limitations of this arterial phase  study.  Review of the MIP images confirms the above findings.  NON-VASCULAR  No intra-abdominal free air or free fluid.  Hepatobiliary: No focal liver abnormality is seen. No gallstones, gallbladder wall thickening, or biliary dilatation.  Pancreas: Unremarkable. No pancreatic ductal dilatation or surrounding inflammatory changes.  Spleen: Normal in size without focal abnormality.  Adrenals/Urinary Tract: The adrenal glands unremarkable. Subcentimeter left renal inferior pole hypodense focus is too small to characterize. There is no hydronephrosis on either side. There is symmetric enhancement and excretion of contrast by both kidneys. The visualized ureters and urinary bladder appear unremarkable.  Stomach/Bowel: There is no bowel obstruction or active inflammation. Several scattered colonic diverticula without active  inflammatory changes. The appendix is normal.  Lymphatic: No adenopathy.  Reproductive: The prostate and seminal vesicles are grossly unremarkable. No pelvic masses  Other: Small fat containing umbilical hernia  Musculoskeletal: Disc desiccation and vacuum phenomena at L5-S1. Grade 1 L5-S1 retrolisthesis. No acute osseous pathology.  Review of the MIP images confirms the above findings.  IMPRESSION: 1. No acute intrathoracic, abdominal, or pelvic pathology. No CT evidence of aortic dissection. 2. No interval change in aneurysmal dilatation of the aortic root and ascending aorta since the prior CT. Continued follow-up as per recommendation of prior CT. 3. No interval change in focal aneurysm of the proximal SMA and distal splenic artery since the prior CT. 4. 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   Electronically Signed   By: Anner Crete M.D.   On: 07/18/2020 16:02 I personally reviewed the CT angiogram images.  No change in the 5.1 cm ascending aneurysm that extends to the arch.  Normal descending and abdominal aorta.  No change in small focal aneurysm of the SMA.  Small pseudoaneurysm of the internal iliac noted as well.  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  Ascending aneurysm-stable at about 5.1 to 5.2 cm.  Importance of blood pressure control emphasized.  Randy Allen is well aware of that and monitors his blood pressure closely.  No indication for surgery currently.  Needs continued semiannual follow-up.  Bicuspid aortic valve with moderate aortic stenosis-gradient actually measured a little bit lower at this time compared to last.  No change.  Continue to monitor.  Hypertension-blood pressure well controlled.  As noted Randy Allen is aware of the  importance of blood pressure control.  Plan: Return in 6 months with CT angiogram of chest  Melrose Nakayama, MD [facility] [phone]

## 2020-08-01 ENCOUNTER — Other Ambulatory Visit: Payer: Self-pay

## 2020-08-01 DIAGNOSIS — Z1211 Encounter for screening for malignant neoplasm of colon: Secondary | ICD-10-CM

## 2020-08-01 LAB — POC HEMOCCULT BLD/STL (HOME/3-CARD/SCREEN)
Card #2 Fecal Occult Blod, POC: NEGATIVE
Card #3 Fecal Occult Blood, POC: NEGATIVE
Fecal Occult Blood, POC: NEGATIVE

## 2020-08-07 DIAGNOSIS — Z1212 Encounter for screening for malignant neoplasm of rectum: Secondary | ICD-10-CM | POA: Diagnosis not present

## 2020-08-07 DIAGNOSIS — Z1211 Encounter for screening for malignant neoplasm of colon: Secondary | ICD-10-CM | POA: Diagnosis not present

## 2020-11-27 ENCOUNTER — Other Ambulatory Visit: Payer: Self-pay | Admitting: Internal Medicine

## 2020-12-22 ENCOUNTER — Other Ambulatory Visit: Payer: Self-pay | Admitting: Thoracic Surgery (Cardiothoracic Vascular Surgery)

## 2020-12-22 DIAGNOSIS — I712 Thoracic aortic aneurysm, without rupture, unspecified: Secondary | ICD-10-CM

## 2020-12-22 DIAGNOSIS — I7121 Aneurysm of the ascending aorta, without rupture: Secondary | ICD-10-CM

## 2021-01-12 NOTE — Progress Notes (Signed)
FOLLOW UP  Assessment and Plan:   Bicuspid aortic valve Newly following with Dr. Harrington Challenger; denies concerning sx  Ascending aortic aneurysm ([place]) 52 mm; pending evaluation by Dr. 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   Reminder to go to the ER if any CP, SOB, nausea, dizziness, severe HA, changes vision/speech, left arm numbness and tingling and jaw pain.  ACE inhibitor cough D/C enalapril Start Losartan 50 mg QD follow up in 1 month for BP check  Cholesterol Currently at goal; continue statin Continue low cholesterol diet and exercise.  Check lipid panel.   Abnormal glucose Recent A1Cs at goal Discussed diet/exercise, weight management  Defer A1C; check CMP  Hypothyroidism Had not been taking his levothyroxine on the weekends.  Has taken correctly since 06/2020, continue medications the same pending lab results reminded to take on an empty stomach 30-43mins before food - take with water only, not with coffee check TSH level  BMI 24 Continue to recommend diet heavy in fruits and veggies and low in animal meats, cheeses, and dairy products, appropriate calorie intake Discuss exercise recommendations routinely Continue to monitor weight at each visit  Snoring/Daytime Somnolence Recommend referral to neurology for sleep study but pt refuses at this time Counseled on risks of sleep apnea, will consider  Vitamin D Def At goal at last visit; continue supplementation to maintain goal of 60-100 Defer Vit D level  Continue diet and meds as discussed. Further disposition pending results of labs. Discussed med's effects and SE's.   Over 30 minutes of exam, counseling, chart review, and  critical decision making was performed.   Future Appointments  Date Time Provider [place]  01/30/2021 12:20 PM [facility] 1 [facility]  01/30/2021  1:45 PM Melrose Nakayama, MD [facility]  07/10/2021 10:00 AM Unk Pinto, MD [facility] None    ----------------------------------------------------------------------------------------------------------------------  HPI 60 y.o. male  presents for 3 month follow up on hypertension, cholesterol, glucose, weight and vitamin D deficiency.   Pt has been having a dry cough for more than 6 months.  Occurs intermittently.Wife has started to notice it more than patient  Has been on enalapril for he believes close to 15 years.   Had known bicuspid aortic valve since 1988 (diagnosed while in the [employer]), had no sx, was just monitoring,  more recently was referred to cardiology for monitoring and saw Dr. Dorris Carnes who ordered echo 09/09/19 which showed normal LV/RV function, mildly stenotic bicuspid AV, dilated aorta of 52 mm. CTA of chest was performed 09/24/19 which showed ascending thoracic aorta approx 5.2 cm, aortic root 4.4-4.5 cm. Also noted focal aneurysmal disease of superior mesenteric artery trunk and mild splenic artery dilation. Recommended for 6 month follow up CTA . CTA 07/18/20:IMPRESSION: 1. No acute intrathoracic, abdominal, or pelvic pathology. No CT evidence of aortic dissection. 2. No interval change in aneurysmal dilatation of the aortic root and ascending aorta since the prior CT. Continued follow-up as per recommendation of prior CT. 3. No interval change in focal aneurysm of the proximal SMA and distal splenic artery since the prior CT. 4. 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  Following up with Dr. Roxan Hockey 01/30/21  BMI is  Body mass index is 25.37 kg/m., he has been working on diet, admits to minimal exercise.  Wt Readings from Last 3  Encounters:  01/15/21 171 lb 12.8 oz (77.9 kg)  07/25/20 169 lb (76.7 kg)  07/04/20 169 lb 6.4 oz (76.8 kg)  Pt has been snoring more frequently, feels very tired during the day. Can fall asleep sitting up, has come close to falling asleep at work. He is a dye Agricultural engineer.   His blood pressure has been controlled at home, today their BP is BP: 110/74 BP Readings from Last 3 Encounters:  01/15/21 110/74  07/25/20 124/78  07/04/20 124/82     He does not workout. He denies chest pain, shortness of breath, dizziness, edema.   He is on cholesterol medication Atorvastatin 20 mg daily and denies myalgias. His cholesterol is at goal. The cholesterol last visit was:   Lab Results  Component Value Date   CHOL 119 07/04/2020   HDL 47 07/04/2020   LDLCALC 52 07/04/2020   TRIG 121 07/04/2020   CHOLHDL 2.5 07/04/2020    He has not been working on diet and exercise for glucose management, and denies increased appetite, nausea, paresthesia of the feet, polydipsia, polyuria and visual disturbances. Last A1C in the office was:  Lab Results  Component Value Date   HGBA1C 5.2 07/04/2020   He is on thyroid medication. His medication was not changed last visit. He is taking 50 mcg daily, takes first thing in the morning with coffee with creamer. Had been skipping them on the weekends but has been taking them regularly since last visit.   Lab Results  Component Value Date   TSH 5.26 (H) 07/04/2020   Patient is on Vitamin D supplement.   Lab Results  Component Value Date   VD25OH 77 07/04/2020        Current Medications:  Current Outpatient Medications on File Prior to Visit  Medication Sig   Ascorbic Acid (VITAMIN C PO) Take by mouth daily.   aspirin EC 81 MG tablet Take 81 mg by mouth daily.   atorvastatin (LIPITOR) 40 MG tablet Take 1 tablet Daily  for Cholesterol   B Complex Vitamins (VITAMIN B COMPLEX PO) Take by mouth daily.   calcium carbonate (TUMS - DOSED IN MG ELEMENTAL CALCIUM) 500 MG  chewable tablet Chew 1 tablet by mouth daily.   cetirizine (ZYRTEC) 10 MG tablet Take 10 mg by mouth daily. OTC   Cholecalciferol (VITAMIN D PO) Take 5,000 Int'l Units by mouth daily.    enalapril (VASOTEC) 20 MG tablet Take  1 tablet  Daily  for BP   levothyroxine (SYNTHROID) 50 MCG tablet Take  1 tablet  Daily  on an empty stomach with only water for 30 minutes & no Antacid meds, Calcium or Magnesium for 4 hours & avoid Biotin   OVER THE COUNTER MEDICATION OTC Flonase PRN   amoxicillin (AMOXIL) 500 MG capsule TAKE 4 CAPSULES BY MOUTH ONE HOUR BEFORE DENTAL PROCEDURE FOR BICUSPID AORTIC VALVE (Patient not taking: Reported on 01/15/2021)   famotidine (PEPCID) 20 MG tablet Take 20 mg by mouth daily. OTC (Patient not taking: Reported on 01/15/2021)   No current facility-administered medications on file prior to visit.     Allergies:  Allergies  Allergen Reactions   Propofol Shortness Of Breath    CHEST TIGHTNESS   Zoloft [Sertraline Hcl] Other (See Comments)    HYPERACTIVE     Medical History:  Past Medical History:  Diagnosis Date  Dental crowns present    Heart murmur    states need for antibiotics prior to dental procedures; no cardiologist   History of hepatitis A    age 89   History of MRSA infection 2015   right great toe   Hyperlipidemia    Hypertension    states under control with med., has been on med. > 10 yr.   Hypothyroidism    Mass of skin of right shoulder 04/2015   Neck mass 04/2015   posterior   Seasonal allergies    Vitamin D deficiency    Family history- Reviewed and unchanged Social history- Reviewed and unchanged   Review of Systems:  Review of Systems  Constitutional:  Positive for malaise/fatigue. Negative for weight loss.  HENT:  Negative for hearing loss and tinnitus.        Snoring  Eyes:  Negative for blurred vision and double vision.  Respiratory:  Positive for cough. Negative for shortness of breath and wheezing.   Cardiovascular:  Negative  for chest pain, palpitations, orthopnea, claudication and leg swelling.  Gastrointestinal:  Negative for abdominal pain, blood in stool, constipation, diarrhea, heartburn, melena, nausea and vomiting.  Genitourinary: Negative.   Musculoskeletal:  Negative for joint pain and myalgias.  Skin:  Negative for rash.  Neurological:  Negative for dizziness, tingling, sensory change, weakness and headaches.  Endo/Heme/Allergies:  Negative for polydipsia.  Psychiatric/Behavioral: Negative.    All other systems reviewed and are negative.  Physical Exam: BP 110/74   Pulse 68   Temp 97.7 F (36.5 C)   Wt 171 lb 12.8 oz (77.9 kg)   SpO2 97%   BMI 25.37 kg/m  Wt Readings from Last 3 Encounters:  01/15/21 171 lb 12.8 oz (77.9 kg)  07/25/20 169 lb (76.7 kg)  07/04/20 169 lb 6.4 oz (76.8 kg)   General Appearance: Well nourished, in no apparent distress. Eyes: PERRLA, EOMs, conjunctiva no swelling or erythema Sinuses: No Frontal/maxillary tenderness ENT/Mouth: Ext aud canals clear, TMs without erythema, bulging. No erythema, swelling, or exudate on post pharynx.  Tonsils not swollen or erythematous. Hearing normal.  Neck: Supple, thyroid normal.  Respiratory: Respiratory effort normal, BS equal bilaterally without rales, rhonchi, wheezing or stridor.  Cardio: RRR with 3/6 harsh blowing systolic murmur. Brisk peripheral pulses without edema.  Abdomen: Soft, + BS.  Non tender, no guarding, rebound, hernias, masses. Lymphatics: Non tender without lymphadenopathy.  Musculoskeletal: Full ROM, 5/5 strength, Normal gait Skin: Warm, dry without rashes, lesions, ecchymosis.  Neuro: Cranial nerves intact. No cerebellar symptoms.  Psych: Awake and oriented X 3, normal affect, Insight and Judgment appropriate.    Magda Bernheim, NP 4:16 PM [facility]

## 2021-01-15 ENCOUNTER — Ambulatory Visit (INDEPENDENT_AMBULATORY_CARE_PROVIDER_SITE_OTHER): Payer: Managed Care, Other (non HMO) | Admitting: Nurse Practitioner

## 2021-01-15 ENCOUNTER — Other Ambulatory Visit: Payer: Self-pay

## 2021-01-15 ENCOUNTER — Encounter: Payer: Self-pay | Admitting: Nurse Practitioner

## 2021-01-15 VITALS — BP 110/74 | HR 68 | Temp 97.7°F | Wt 171.8 lb

## 2021-01-15 DIAGNOSIS — N182 Chronic kidney disease, stage 2 (mild): Secondary | ICD-10-CM

## 2021-01-15 DIAGNOSIS — I7121 Aneurysm of the ascending aorta, without rupture: Secondary | ICD-10-CM

## 2021-01-15 DIAGNOSIS — R400 Somnolence: Secondary | ICD-10-CM

## 2021-01-15 DIAGNOSIS — T464X5A Adverse effect of angiotensin-converting-enzyme inhibitors, initial encounter: Secondary | ICD-10-CM

## 2021-01-15 DIAGNOSIS — R0683 Snoring: Secondary | ICD-10-CM

## 2021-01-15 DIAGNOSIS — I728 Aneurysm of other specified arteries: Secondary | ICD-10-CM

## 2021-01-15 DIAGNOSIS — E559 Vitamin D deficiency, unspecified: Secondary | ICD-10-CM

## 2021-01-15 DIAGNOSIS — R7309 Other abnormal glucose: Secondary | ICD-10-CM | POA: Diagnosis not present

## 2021-01-15 DIAGNOSIS — E782 Mixed hyperlipidemia: Secondary | ICD-10-CM | POA: Diagnosis not present

## 2021-01-15 DIAGNOSIS — E039 Hypothyroidism, unspecified: Secondary | ICD-10-CM

## 2021-01-15 DIAGNOSIS — I10 Essential (primary) hypertension: Secondary | ICD-10-CM | POA: Diagnosis not present

## 2021-01-15 DIAGNOSIS — R058 Other specified cough: Secondary | ICD-10-CM

## 2021-01-15 DIAGNOSIS — Q231 Congenital insufficiency of aortic valve: Secondary | ICD-10-CM

## 2021-01-15 MED ORDER — LOSARTAN POTASSIUM 50 MG PO TABS: 50.0000 mg | ORAL_TABLET | Freq: Every day | ORAL | 1 refills | Status: DC

## 2021-01-15 NOTE — Patient Instructions (Signed)
Losartan Tablets What is this medication? LOSARTAN (loe SAR tan) treats high blood pressure. It may also be used to prevent a stroke in people with heart disease and high blood pressure. It can be used to prevent kidney damage in people with diabetes. It works by relaxing the blood vessels, which helps decrease the amount of work your heart has to do. It belongs to a group of medications called ARBs. This medicine may be used for other purposes; ask your health care provider or pharmacist if you have questions. COMMON BRAND NAME(S): Cozaar What should I tell my care team before I take this medication? They need to know if you have any of these conditions: Heart failure Kidney disease Liver disease An unusual or allergic reaction to losartan, other medications, foods, dyes, or preservatives Pregnant or trying to get pregnant Breast-feeding How should I use this medication? Take this medication by mouth. Take it as directed on the prescription label at the same time every day. You can take it with or without food. If it upsets your stomach, take it with food. Keep taking it unless your care team tells you to stop. Talk to your care team about the use of this medication in children. While it may be prescribed for children as young as 6 for selected conditions, precautions do apply. Overdosage: If you think you have taken too much of this medicine contact a poison control center or emergency room at once. NOTE: This medicine is only for you. Do not share this medicine with others. What if I miss a dose? If you miss a dose, take it as soon as you can. If it is almost time for your next dose, take only that dose. Do not take double or extra doses. What may interact with this medication? Aliskiren ACE inhibitors, like enalapril or lisinopril Diuretics, especially amiloride, eplerenone, spironolactone, or triamterene Lithium NSAIDs, medications for pain and inflammation, like ibuprofen or  naproxen Potassium salts or potassium supplements This list may not describe all possible interactions. Give your health care provider a list of all the medicines, herbs, non-prescription drugs, or dietary supplements you use. Also tell them if you smoke, drink alcohol, or use illegal drugs. Some items may interact with your medicine. What should I watch for while using this medication? Visit your care team for regular check ups. Check your blood pressure as directed. Ask your care team what your blood pressure should be. Also, find out when you should contact them. Do not treat yourself for coughs, colds, or pain while you are using this medication without asking your care team for advice. Some medications may increase your blood pressure. Women should inform their care team if they wish to become pregnant or think they might be pregnant. There is a potential for serious side effects to an unborn child. Talk to your care team for more information. You may get drowsy or dizzy. Do not drive, use machinery, or do anything that needs mental alertness until you know how this medication affects you. Do not stand or sit up quickly, especially if you are an older patient. This reduces the risk of dizzy or fainting spells. Alcohol can make you more drowsy and dizzy. Avoid alcoholic drinks. Avoid salt substitutes unless you are told otherwise by your care team. What side effects may I notice from receiving this medication? Side effects that you should report to your care team as soon as possible: Allergic reactions-skin rash, itching, hives, swelling of the face, lips, tongue, or  throat High potassium level-muscle weakness, fast or irregular heartbeat Kidney injury-decrease in the amount of urine, swelling of the ankles, hands, or feet Low blood pressure-dizziness, feeling faint or lightheaded, blurry vision Side effects that usually do not require medical attention (report to your care team if they continue  or are bothersome): Dizziness Headache Runny or stuffy nose This list may not describe all possible side effects. Call your doctor for medical advice about side effects. You may report side effects to FDA at 1-800-FDA-1088. Where should I keep my medication? Keep out of the reach of children and pets. Store at room temperature between 20 and 25 degrees C (68 and 77 degrees F). Protect from light. Keep the container tightly closed. Get rid of any unused medication after the expiration date. To get rid of medications that are no longer needed or have expired: Take the medication to a medication take-back program. Check with your pharmacy or law enforcement to find a location. If you cannot return the medication, check the label or package insert to see if the medication should be thrown out in the garbage or flushed down the toilet. If you are not sure, ask your care team. If it is safe to put in the trash, empty the medication out of the container. Mix the medication with cat litter, dirt, coffee grounds, or other unwanted substance. Seal the mixture in a bag or container. Put it in the trash. NOTE: This sheet is a summary. It may not cover all possible information. If you have questions about this medicine, talk to your doctor, pharmacist, or health care provider.  2022 Elsevier/Gold Standard (2020-02-23 13:49:17)

## 2021-01-16 LAB — LIPID PANEL
Cholesterol: 108 mg/dL (ref <200)
HDL: 46 mg/dL (ref >=40)
LDL Cholesterol (Calc): 41 mg/dL (calc)
Non-HDL Cholesterol (Calc): 62 mg/dL (calc) (ref <130)
Total CHOL/HDL Ratio: 2.3 (calc) (ref <5.0)
Triglycerides: 125 mg/dL (ref <150)

## 2021-01-16 LAB — CBC WITH DIFFERENTIAL/PLATELET
Absolute Monocytes: 520 cells/uL (ref 200–950)
Basophils Absolute: 33 cells/uL (ref 0–200)
Basophils Relative: 0.5 %
Eosinophils Absolute: 241 cells/uL (ref 15–500)
Eosinophils Relative: 3.7 %
HCT: 42.2 % (ref 38.5–50.0)
Hemoglobin: 14.4 g/dL (ref 13.2–17.1)
Lymphs Abs: 1560 cells/uL (ref 850–3900)
MCH: 31 pg (ref 27.0–33.0)
MCHC: 34.1 g/dL (ref 32.0–36.0)
MCV: 90.9 fL (ref 80.0–100.0)
MPV: 9.7 fL (ref 7.5–12.5)
Monocytes Relative: 8 %
Neutro Abs: 4147 cells/uL (ref 1500–7800)
Neutrophils Relative %: 63.8 %
Platelets: 244 10*3/uL (ref 140–400)
RBC: 4.64 10*6/uL (ref 4.20–5.80)
RDW: 12.8 % (ref 11.0–15.0)
Total Lymphocyte: 24 %
WBC: 6.5 10*3/uL (ref 3.8–10.8)

## 2021-01-16 LAB — COMPLETE METABOLIC PANEL WITH GFR
AG Ratio: 1.9 (calc) (ref 1.0–2.5)
ALT: 27 U/L (ref 9–46)
AST: 22 U/L (ref 10–35)
Albumin: 4.2 g/dL (ref 3.6–5.1)
Alkaline phosphatase (APISO): 64 U/L (ref 35–144)
BUN: 18 mg/dL (ref 7–25)
CO2: 29 mmol/L (ref 20–32)
Calcium: 9.3 mg/dL (ref 8.6–10.3)
Chloride: 106 mmol/L (ref 98–110)
Creat: 1 mg/dL (ref 0.70–1.30)
Globulin: 2.2 g/dL (calc) (ref 1.9–3.7)
Glucose, Bld: 87 mg/dL (ref 65–99)
Potassium: 4.1 mmol/L (ref 3.5–5.3)
Sodium: 141 mmol/L (ref 135–146)
Total Bilirubin: 0.5 mg/dL (ref 0.2–1.2)
Total Protein: 6.4 g/dL (ref 6.1–8.1)
eGFR: 87 mL/min/{1.73_m2} (ref >=60)

## 2021-01-16 LAB — HEMOGLOBIN A1C
Hgb A1c MFr Bld: 5.1 % of total Hgb (ref <5.7)
Mean Plasma Glucose: 100 mg/dL
eAG (mmol/L): 5.5 mmol/L

## 2021-01-16 LAB — MICROALBUMIN / CREATININE URINE RATIO
Creatinine, Urine: 178 mg/dL (ref 20–320)
Microalb Creat Ratio: 1 mcg/mg creat (ref <30)
Microalb, Ur: 0.2 mg/dL

## 2021-01-16 LAB — TSH: TSH: 5.02 mIU/L — ABNORMAL HIGH (ref 0.40–4.50)

## 2021-01-30 ENCOUNTER — Ambulatory Visit: Payer: 59 | Admitting: Thoracic Surgery (Cardiothoracic Vascular Surgery)

## 2021-01-30 ENCOUNTER — Ambulatory Visit
Admission: RE | Admit: 2021-01-30 | Discharge: 2021-01-30 | Disposition: A | Discharge status: Home / Self Care | Payer: 59 | Source: Ambulatory Visit | Attending: Thoracic Surgery (Cardiothoracic Vascular Surgery) | Admitting: Thoracic Surgery (Cardiothoracic Vascular Surgery)

## 2021-01-30 DIAGNOSIS — I712 Thoracic aortic aneurysm, without rupture, unspecified: Secondary | ICD-10-CM

## 2021-01-30 MED ORDER — IOPAMIDOL (ISOVUE-370) INJECTION 76%
75.0000 mL | Freq: Once | INTRAVENOUS | Status: AC | PRN
  Administered 2021-01-30: 75 mL via INTRAVENOUS

## 2021-01-31 ENCOUNTER — Other Ambulatory Visit: Payer: Self-pay

## 2021-01-31 DIAGNOSIS — E039 Hypothyroidism, unspecified: Secondary | ICD-10-CM

## 2021-01-31 MED ORDER — LEVOTHYROXINE SODIUM 50 MCG PO TABS: ORAL_TABLET | ORAL | 1 refills | Status: DC

## 2021-02-02 ENCOUNTER — Ambulatory Visit: Payer: PRIVATE HEALTH INSURANCE | Admitting: Thoracic Surgery (Cardiothoracic Vascular Surgery)

## 2021-02-05 ENCOUNTER — Ambulatory Visit (INDEPENDENT_AMBULATORY_CARE_PROVIDER_SITE_OTHER): Payer: PRIVATE HEALTH INSURANCE | Admitting: Thoracic Surgery (Cardiothoracic Vascular Surgery)

## 2021-02-05 ENCOUNTER — Encounter: Payer: Self-pay | Admitting: Thoracic Surgery (Cardiothoracic Vascular Surgery)

## 2021-02-05 ENCOUNTER — Other Ambulatory Visit: Payer: Self-pay

## 2021-02-05 VITALS — BP 151/88 | HR 77 | Resp 20 | Wt 170.0 lb

## 2021-02-05 DIAGNOSIS — I712 Thoracic aortic aneurysm, without rupture, unspecified: Secondary | ICD-10-CM | POA: Diagnosis not present

## 2021-02-05 NOTE — Progress Notes (Signed)
[address]       [place] [address]             [phone]     HPI: Mr. Bolander returns for follow-up of his ascending aneurysm.  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  He said a heart murmur since childhood.  He was diagnosed with a bicuspid valve in 1988.  Back in 2021 was having some shortness of breath.  He saw Dr. Harrington Challenger.  2D echo showed moderate aortic stenosis with a mean gradient of 25 and a valve area of 1.2 cm.  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  I last saw him in April.  He was doing well at that time and the aneurysm was unchanged.  In the interim since his last visit he continues to do well.  He works as a Print production planner at [employer].  He is not having any chest pain, pressure, tightness, or shortness of breath.  Recently Dr. Idell Pickles office prescribed losartan for him.  He did not fill that prescription and continues to take enalapril 10 mg daily.  Denies orthopnea and peripheral edema.  Past Medical History:  Diagnosis Date   Dental crowns present    Heart murmur    states need for antibiotics prior to dental procedures; no cardiologist   History of hepatitis A    age 67   History of MRSA infection 2015   right great toe   Hyperlipidemia    Hypertension    states under control with med., has been on med. > 10 yr.   Hypothyroidism    Mass of skin of right shoulder 04/2015   Neck mass 04/2015   posterior   Seasonal allergies    Vitamin D deficiency     Current Outpatient Medications  Medication Sig Dispense Refill   amoxicillin (AMOXIL) 500 MG capsule TAKE 4 CAPSULES BY MOUTH ONE HOUR BEFORE DENTAL PROCEDURE FOR BICUSPID AORTIC VALVE 20 capsule 0   Ascorbic Acid (VITAMIN C PO) Take by mouth daily.     aspirin EC 81 MG tablet Take 81 mg by mouth daily.     atorvastatin  (LIPITOR) 40 MG tablet Take 1 tablet Daily  for Cholesterol 90 tablet 1   B Complex Vitamins (VITAMIN B COMPLEX PO) Take by mouth daily.     calcium carbonate (TUMS - DOSED IN MG ELEMENTAL CALCIUM) 500 MG chewable tablet Chew 1 tablet by mouth daily.     cetirizine (ZYRTEC) 10 MG tablet Take 10 mg by mouth daily. OTC     Cholecalciferol (VITAMIN D PO) Take 5,000 Int'l Units by mouth daily.      enalapril (VASOTEC) 10 MG tablet Take 10 mg by mouth daily.     famotidine (PEPCID) 20 MG tablet Take 20 mg by mouth daily. OTC     levothyroxine (SYNTHROID) 50 MCG tablet Take  1 tablet  Daily  on an empty stomach with only water for 30 minutes & no Antacid meds, Calcium or Magnesium for 4 hours & avoid Biotin 90 tablet 1   OVER THE COUNTER MEDICATION OTC Flonase PRN     No current facility-administered medications for this visit.    Physical Exam BP (!) 151/88 (BP Location: Right Arm, Patient Position: Sitting)   Pulse 77   Resp 20   Wt  170 lb (77.1 kg)   SpO2 99% Comment: RA  BMI 25.44 kg/m  60 year old man in no acute distress Alert and oriented x3 with no focal deficits Carotids with transmitted murmurs Cardiac regular rate and rhythm with a 2/6 to 3/6 systolic murmur right upper sternal border Lungs clear bilaterally No peripheral edema  Diagnostic Tests: CT ANGIOGRAPHY CHEST WITH CONTRAST   TECHNIQUE: Multidetector CT imaging of the chest was performed using the standard protocol during bolus administration of intravenous contrast. Multiplanar CT image reconstructions and MIPs were obtained to evaluate the vascular anatomy.   CONTRAST:  49mL ISOVUE-370 IOPAMIDOL (ISOVUE-370) INJECTION 76%   COMPARISON:  07/18/2020; 09/24/2019   FINDINGS: Vascular Findings:   Redemonstrated fusiform aneurysmal dilatation of the ascending thoracic aorta with measurements as follows. The thoracic aorta tapers to a normal caliber at the level of the posterior aspect of the aortic arch. The  descending thoracic aorta is of normal caliber. No evidence of thoracic aortic dissection or perivascular stranding on this nongated examination.   Conventional configuration of the aortic arch. The branch vessels of the aortic arch appear widely patent throughout their imaged courses.   Normal heart size. Calcifications involving the aortic valve leaflets. No pericardial effusion.   Although this examination was not tailored for the evaluation the pulmonary arteries, there are no discrete filling defects within the central pulmonary arterial tree to suggest central pulmonary embolism. Normal caliber of the main pulmonary artery measuring 24 mm in diameter.   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 No perivascular stranding.   -------------------------------------------------------------   Thoracic aortic measurements:   AORTIC ROOT: 46 mm as measured in greatest oblique short axis sagittal diameter (image 105, series 12), grossly unchanged   SINOTUBULAR JUNCTION: 40 mm as measured in greatest oblique short axis coronal dimension (coronal image 65, series 10).   PROXIMAL ASCENDING THORACIC AORTA: 52 mm as measured in greatest oblique short axis axial dimension at the level of the main pulmonary artery (axial image 69, series 5) and approximately 52 mm in greatest oblique short axis coronal diameter (coronal image 62, series 10), unchanged compared to the 09/24/2019 examination by my direct remeasurement.   AORTIC ARCH: 34 mm as measured in greatest oblique short axis sagittal dimension between the takeoffs of the left common carotid and subclavian arteries (sagittal  image 105, series 12, unchanged compared to the 09/2019 examination by my direct remeasurement.   PROXIMAL DESCENDING THORACIC AORTA: 22 mm as measured in greatest oblique short axis axial dimension at the level of the main pulmonary artery.   DISTAL DESCENDING THORACIC AORTA: 20 mm as measured in greatest oblique short axis axial dimension at the level of the diaphragmatic hiatus.   Review of the MIP images confirms the above findings.   -------------------------------------------------------------   Non-Vascular Findings:   Mediastinum/Lymph Nodes: No bulky mediastinal, hilar or axillary lymphadenopathy.   Lungs/Pleura: No focal airspace opacities. No pleural effusion or pneumothorax. Central pulmonary airways appear widely patent.   No discrete pulmonary nodules.   Upper abdomen: Limited early arterial phase evaluation of the upper abdomen is otherwise unremarkable.   Musculoskeletal: No acute or aggressive osseous abnormalities. There is an approximately 0.8 cm hyperattenuating nodule within the left lobe of the thyroid (image 6,  series 5), as well as a smaller approximately 0.5 cm hyperattenuating nodule within the medial aspect the right lobe of the thyroid (image 9, series 5), both of which similar to the 09/2019 examination. Regional soft tissues appear otherwise normal.   IMPRESSION: 1. Stable uncomplicated fusiform aneurysmal dilatation of the aortic root (measuring 46 mm), ascending thoracic aorta (measuring 52 mm) and proximal aspect of the aortic arch (measuring 34 mm), unchanged compared to the 09/2019 examination. 2. 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 3. Incidentally noted bilateral hyperattenuating thyroid nodules, unchanged compared to the 09/2019 examination though further evaluation with thyroid  ultrasound could be performed as indicated.     Electronically Signed   By: Sandi Mariscal M.D.   On: 01/30/2021 13:33 I personally reviewed the CT images.  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   Ascending aneurysm-- aneurysm unchanged from 6 months ago.  Needs continued semiannual follow-up.  He was cautioned not to lift anything over 50 pounds.  His activities are otherwise unrestricted.  Needs continued semiannual follow-up.  Bicuspid valve-moderate aortic stenosis by echo in February 2022.  Should be due for another echocardiogram in about 6 months as well.    Hypertension-blood pressure elevated.  He says he was a little stressed that getting here today.  He checks himself regularly at home and says his systolic is usually around 120 or slightly less.  He did not want to start losartan so he continues to take enalapril.   Plan: Return in 6 months with CT angio chest  Melrose Nakayama, MD [facility] [phone]

## 2021-02-15 ENCOUNTER — Ambulatory Visit: Payer: Managed Care, Other (non HMO) | Admitting: Nurse Practitioner

## 2021-04-17 ENCOUNTER — Other Ambulatory Visit: Payer: Self-pay

## 2021-04-17 DIAGNOSIS — E039 Hypothyroidism, unspecified: Secondary | ICD-10-CM

## 2021-04-17 MED ORDER — LEVOTHYROXINE SODIUM 50 MCG PO TABS: ORAL_TABLET | ORAL | 1 refills | Status: DC

## 2021-06-18 ENCOUNTER — Other Ambulatory Visit: Payer: Self-pay | Admitting: Thoracic Surgery (Cardiothoracic Vascular Surgery)

## 2021-06-18 DIAGNOSIS — I712 Thoracic aortic aneurysm, without rupture, unspecified: Secondary | ICD-10-CM

## 2021-06-21 ENCOUNTER — Other Ambulatory Visit: Payer: Self-pay | Admitting: Internal Medicine

## 2021-06-21 DIAGNOSIS — E039 Hypothyroidism, unspecified: Secondary | ICD-10-CM

## 2021-06-21 DIAGNOSIS — I10 Essential (primary) hypertension: Secondary | ICD-10-CM

## 2021-06-21 DIAGNOSIS — E782 Mixed hyperlipidemia: Secondary | ICD-10-CM

## 2021-06-21 MED ORDER — ATORVASTATIN CALCIUM 40 MG PO TABS: ORAL_TABLET | ORAL | 3 refills | Status: DC

## 2021-06-21 MED ORDER — ENALAPRIL MALEATE 10 MG PO TABS: ORAL_TABLET | ORAL | 3 refills | Status: DC

## 2021-06-21 MED ORDER — LEVOTHYROXINE SODIUM 50 MCG PO TABS: ORAL_TABLET | ORAL | 3 refills | Status: DC

## 2021-07-09 ENCOUNTER — Encounter: Payer: Self-pay | Admitting: Internal Medicine

## 2021-07-09 NOTE — Progress Notes (Signed)
Annual  Screening/Preventative Visit  & Comprehensive Evaluation & Examination  Future Appointments  Date Time Provider Department  07/10/2021 10:00 AM Lucky Cowboy, MD [facility]  08/07/2021  1:00 PM Loreli Slot, MD [facility]  07/17/2022 10:00 AM Lucky Cowboy, MD [facility]            This very nice 61 y.o. MWM presents for a Screening /Preventative Visit & comprehensive evaluation and management of multiple medical co-morbidities.  Patient has been followed for HTN, HLD, Prediabetes, Hypothyroidism and Vitamin D Deficiency. Patient is followed by Dr Andrey Spearman for a Thoracic Aortic Aneurysm & Dr Dietrich Pates for a Bicuspid Aortic Valve.  Patient also has hx/o GERD controlled on OTC pepcid.        HTN predates since  1999. Patient's BP has been controlled at home. Patient has CKD2 ( GFR 64).  Today's BP is at goal -  128/74. Patient denies any cardiac symptoms as chest pain, palpitations, shortness of breath, dizziness or ankle swelling.       Patient's hyperlipidemia is controlled with diet and Atorvastatin. Patient denies myalgias or other medication SE's. Last lipids were at goal :  Lab Results  Component Value Date   CHOL 108 01/15/2021   HDL 46 01/15/2021   LDLCALC 41 01/15/2021   TRIG 125 01/15/2021   CHOLHDL 2.3 01/15/2021         Patient has been monitored expectantly for glucose intolerance and patient denies reactive hypoglycemic symptoms, visual blurring, diabetic polys or paresthesias. Last A1c was at goal :   Lab Results  Component Value Date   HGBA1C 5.1 01/15/2021         Patient was diagnosed  hypothyroid in 2011 & initiated on thyroid replacement therapy.        Finally, patient has history of Vitamin D Deficiency ("12" /2008) and last vitamin D was at goal :   Lab Results  Component Value Date   VD25OH 77 07/04/2020     Current Outpatient Medications on File Prior to Visit  Medication Sig   amoxicillin 500 MG capsule TAKE 4  CAPSULES ONE HOUR BEFORE DENTAL PROCEDURE FOR BICUSPID AORTIC VALVE   VITAMIN C Take daily.   aspirin EC 81 MG tablet Take  daily.   atorvastatin 40 MG tablet Take  1 tablet  Daily  for Cholesterol   B Complex Vitamins  Take daily.   TUMS  500 MG chewable tablet  1 tablet daily.   cetirizine (ZYRTEC) 10 MG tablet Take daily. OTC   VITAMIN D  5,000 Units d Take  daily.    enalapril (VASOTEC) 10 MG tablet Take  1 tablet  Daily  for BP   famotidine (20 MG tablet Take daily   levothyroxine (50 MCG tablet Take  1 tablet  5 x/wk & 1.5 tab 2 x /wk = 8 tabs /wk     OTC Flonase  PRN     Allergies  Allergen Reactions   Propofol Shortness Of Breath    CHEST TIGHTNESS   Zoloft [Sertraline Hcl] Other (See Comments)    HYPERACTIVE     Past Medical History:  Diagnosis Date   Dental crowns present    Heart murmur    states need for antibiotics prior to dental procedures; no cardiologist   History of hepatitis A    age 4   History of MRSA infection 2015   right great toe   Hyperlipidemia    Hypertension    states under control with  med., has been on med. > 10 yr.   Hypothyroidism    Mass of skin of right shoulder 04/2015   Neck mass 04/2015   posterior   Seasonal allergies    Vitamin D deficiency      Health Maintenance  Topic Date Due   Zoster Vaccines- Shingrix (1 of 2) Never done   COVID-19 Vaccine (4 - Booster for [employer] series) 05/27/2020   INFLUENZA VACCINE  Never done   TETANUS/TDAP  10/28/2023   Hepatitis C Screening  Completed   HIV Screening  Completed   HPV VACCINES  Aged Out     Immunization History  Administered Date(s) Administered   PFIZER SARS-COV-2 Vacc  07/08/2019, 07/31/2019, 04/01/2020   PPD Test 06/04/2018, 06/24/2019, 07/04/2020   Pneumococcal-23 12/16/2008   Td 04/15/2005   Tdap 10/27/2013    Last Colon - 07.09.2015 - Dr Arlyce Dice - Recc 10 year f/u due July 2025   Past Surgical History:  Procedure Laterality Date   COLONOSCOPY WITH PROPOFOL   10/21/2013   EYE SURGERY Right 1966   HYDROCELE EXCISION     MASS EXCISION Right 04/26/2015   Procedure: EXCISION MASS NECK AND RIGHT SHOULDER ;  Surgeon: Luretha Murphy, MD;  Location: [facility];  Service: General;  Laterality: Right;     Family History  Problem Relation Age of Onset   Arthritis Mother    Depression Mother    Migraines Mother    Cancer Father        prostate   Arthritis Father    Ulcers Father    Heart attack Father    Colon cancer Maternal Uncle 57     Social History   Socioeconomic History   Marital status: Married      Spouse name: Adela Lank       Number of children: None  Occupational History   Not on file   Tobacco Use   Smoking status: Never   Smokeless tobacco: Never  Substance Use Topics   Alcohol use: Yes    Comment: socially   Drug use: No      ROS Constitutional: Denies fever, chills, weight loss/gain, headaches, insomnia,  night sweats or change in appetite. Does c/o fatigue. Eyes: Denies redness, blurred vision, diplopia, discharge, itchy or watery eyes.  ENT: Denies discharge, congestion, post nasal drip, epistaxis, sore throat, earache, hearing loss, dental pain, Tinnitus, Vertigo, Sinus pain or snoring.  Cardio: Denies chest pain, palpitations, irregular heartbeat, syncope, dyspnea, diaphoresis, orthopnea, PND, claudication or edema Respiratory: denies cough, dyspnea, DOE, pleurisy, hoarseness, laryngitis or wheezing.  Gastrointestinal: Denies dysphagia, heartburn, reflux, water brash, pain, cramps, nausea, vomiting, bloating, diarrhea, constipation, hematemesis, melena, hematochezia, jaundice or hemorrhoids Genitourinary: Denies dysuria, frequency, urgency, nocturia, hesitancy, discharge, hematuria or flank pain Musculoskeletal: Denies arthralgia, myalgia, stiffness, Jt. Swelling, pain, limp or strain/sprain. Denies Falls. Skin: Denies puritis, rash, hives, warts, acne, eczema or change in skin lesion Neuro: No  weakness, tremor, incoordination, spasms, paresthesia or pain Psychiatric: Denies confusion, memory loss or sensory loss. Denies Depression. Endocrine: Denies change in weight, skin, hair change, nocturia, and paresthesia, diabetic polys, visual blurring or hyper / hypo glycemic episodes.  Heme/Lymph: No excessive bleeding, bruising or enlarged lymph nodes.   Physical Exam  BP 128/74   Pulse 66   Temp 97.6 F (36.4 C)   Resp 16   Ht 5\' 9"  (1.753 m)   Wt 172 lb 9.6 oz (78.3 kg)   SpO2 97%   BMI 25.49 kg/m   General  Appearance: Well nourished and well groomed and in no apparent distress.  Eyes: PERRLA, EOMs, conjunctiva no swelling or erythema, normal fundi and vessels. Sinuses: No frontal/maxillary tenderness ENT/Mouth: EACs patent / TMs  nl. Nares clear without erythema, swelling, mucoid exudates. Oral hygiene is good. No erythema, swelling, or exudate. Tongue normal, non-obstructing. Tonsils not swollen or erythematous. Hearing normal.  Neck: Supple, thyroid not palpable. No bruits, nodes or JVD. Respiratory: Respiratory effort normal.  BS equal and clear bilateral without rales, rhonci, wheezing or stridor. Cardio: Heart sounds are normal with regular rate with a  Gr 2-3/4 sys murmur at the LSB. Peripheral pulses are normal and equal bilaterally without edema. No aortic or femoral bruits. Chest: symmetric with normal excursions and percussion.  Abdomen: Soft, with Nl bowel sounds. Nontender, no guarding, rebound, hernias, masses, or organomegaly.  Lymphatics: Non tender without lymphadenopathy.  Musculoskeletal: Full ROM all peripheral extremities, joint stability, 5/5 strength, and normal gait. Skin: Warm and dry without rashes, lesions, cyanosis, clubbing or  ecchymosis.  Neuro: Cranial nerves intact, reflexes equal bilaterally. Normal muscle tone, no cerebellar symptoms. Sensation intact.  Pysch: Alert and oriented x 3 with normal affect, insight and judgment appropriate.    Assessment and Plan  1. Annual Preventative/Screening Exam    2. Essential hypertension  - EKG 12-Lead - Urinalysis, Routine w reflex microscopic - Microalbumin / creatinine urine ratio - CBC with Differential/Platelet - COMPLETE METABOLIC PANEL WITH GFR - Magnesium - TSH  3. Hyperlipidemia, mixed  - EKG 12-Lead - Lipid panel - TSH  4. Abnormal glucose  - EKG 12-Lead - Hemoglobin A1c - Insulin, random  5. Vitamin D deficiency  - VITAMIN D 25 Hydroxy   6. Hypothyroidism  - TSH  7. Bicuspid aortic valve  - EKG 12-Lead  8. Aneurysm of ascending aorta without rupture  - EKG 12-Lead  9. CKD (chronic kidney disease) stage 2, GFR 60-89 ml/min  - Urinalysis, Routine w reflex microscopic - Microalbumin / creatinine urine ratio - COMPLETE METABOLIC PANEL WITH GFR  10. Screening for colorectal cancer  - POC Hemoccult Bld/Stl  11. Prostate cancer screening  - PSA  12. Screening-pulmonary TB  - TB Skin Test  13. Screening for ischemic heart disease  - EKG 12-Lead  14. FHx: heart disease  - EKG 12-Lead  15. Screening for AAA (aortic abdominal aneurysm)   16. Fatigue, unspecified type  - Iron, Total/Total Iron Binding Cap - Vitamin B12 - Testosterone - CBC with Differential/Platelet - TSH  17. Medication management  - Urinalysis, Routine w reflex microscopic - Microalbumin / creatinine urine ratio - Testosterone - TB Skin Test - CBC with Differential/Platelet - COMPLETE METABOLIC PANEL WITH GFR - Magnesium - Lipid panel - TSH - Hemoglobin A1c        Patient was counseled in prudent diet, weight control to achieve/maintain BMI less than 25, BP monitoring, regular exercise and medications as discussed.  Discussed med effects and SE's. Routine screening labs and tests as requested with regular follow-up as recommended. Over 40 minutes of exam, counseling, chart review and high complex critical decision making was performed   Marinus Maw, MD

## 2021-07-09 NOTE — Patient Instructions (Signed)

## 2021-07-10 ENCOUNTER — Other Ambulatory Visit: Payer: Self-pay

## 2021-07-10 ENCOUNTER — Ambulatory Visit (INDEPENDENT_AMBULATORY_CARE_PROVIDER_SITE_OTHER): Payer: Managed Care, Other (non HMO) | Admitting: Internal Medicine

## 2021-07-10 ENCOUNTER — Encounter: Payer: Self-pay | Admitting: Internal Medicine

## 2021-07-10 VITALS — BP 128/74 | HR 66 | Temp 97.6°F | Resp 16 | Ht 69.0 in | Wt 172.6 lb

## 2021-07-10 DIAGNOSIS — Z79899 Other long term (current) drug therapy: Secondary | ICD-10-CM | POA: Diagnosis not present

## 2021-07-10 DIAGNOSIS — R350 Frequency of micturition: Secondary | ICD-10-CM | POA: Diagnosis not present

## 2021-07-10 DIAGNOSIS — Z111 Encounter for screening for respiratory tuberculosis: Secondary | ICD-10-CM

## 2021-07-10 DIAGNOSIS — N401 Enlarged prostate with lower urinary tract symptoms: Secondary | ICD-10-CM | POA: Diagnosis not present

## 2021-07-10 DIAGNOSIS — Q231 Congenital insufficiency of aortic valve: Secondary | ICD-10-CM

## 2021-07-10 DIAGNOSIS — Z0000 Encounter for general adult medical examination without abnormal findings: Secondary | ICD-10-CM | POA: Diagnosis not present

## 2021-07-10 DIAGNOSIS — Z125 Encounter for screening for malignant neoplasm of prostate: Secondary | ICD-10-CM

## 2021-07-10 DIAGNOSIS — Z131 Encounter for screening for diabetes mellitus: Secondary | ICD-10-CM

## 2021-07-10 DIAGNOSIS — E782 Mixed hyperlipidemia: Secondary | ICD-10-CM

## 2021-07-10 DIAGNOSIS — Z130 Encounter for screening for diseases of the blood and blood-forming organs and certain disorders involving the immune mechanism: Secondary | ICD-10-CM

## 2021-07-10 DIAGNOSIS — I10 Essential (primary) hypertension: Secondary | ICD-10-CM | POA: Diagnosis not present

## 2021-07-10 DIAGNOSIS — R5383 Other fatigue: Secondary | ICD-10-CM

## 2021-07-10 DIAGNOSIS — Z1329 Encounter for screening for other suspected endocrine disorder: Secondary | ICD-10-CM | POA: Diagnosis not present

## 2021-07-10 DIAGNOSIS — Z136 Encounter for screening for cardiovascular disorders: Secondary | ICD-10-CM

## 2021-07-10 DIAGNOSIS — R7309 Other abnormal glucose: Secondary | ICD-10-CM

## 2021-07-10 DIAGNOSIS — Z13220 Encounter for screening for lipoid disorders: Secondary | ICD-10-CM | POA: Diagnosis not present

## 2021-07-10 DIAGNOSIS — Z1389 Encounter for screening for other disorder: Secondary | ICD-10-CM | POA: Diagnosis not present

## 2021-07-10 DIAGNOSIS — Z1211 Encounter for screening for malignant neoplasm of colon: Secondary | ICD-10-CM

## 2021-07-10 DIAGNOSIS — E559 Vitamin D deficiency, unspecified: Secondary | ICD-10-CM | POA: Diagnosis not present

## 2021-07-10 DIAGNOSIS — I7121 Aneurysm of the ascending aorta, without rupture: Secondary | ICD-10-CM

## 2021-07-10 DIAGNOSIS — Z8249 Family history of ischemic heart disease and other diseases of the circulatory system: Secondary | ICD-10-CM

## 2021-07-10 DIAGNOSIS — Z0001 Encounter for general adult medical examination with abnormal findings: Secondary | ICD-10-CM

## 2021-07-10 DIAGNOSIS — E039 Hypothyroidism, unspecified: Secondary | ICD-10-CM

## 2021-07-10 DIAGNOSIS — K219 Gastro-esophageal reflux disease without esophagitis: Secondary | ICD-10-CM

## 2021-07-10 DIAGNOSIS — N182 Chronic kidney disease, stage 2 (mild): Secondary | ICD-10-CM

## 2021-07-10 MED ORDER — FAMOTIDINE 40 MG PO TABS: ORAL_TABLET | ORAL | 3 refills | Status: DC

## 2021-07-10 MED ORDER — IPRATROPIUM BROMIDE 0.06 % NA SOLN: 2.0000 | Freq: Three times a day (TID) | NASAL | 2 refills | Status: DC

## 2021-07-11 LAB — CBC WITH DIFFERENTIAL/PLATELET
Absolute Monocytes: 382 cells/uL (ref 200–950)
Basophils Absolute: 23 cells/uL (ref 0–200)
Basophils Relative: 0.4 %
Eosinophils Absolute: 200 cells/uL (ref 15–500)
Eosinophils Relative: 3.5 %
HCT: 43.5 % (ref 38.5–50.0)
Hemoglobin: 14.6 g/dL (ref 13.2–17.1)
Lymphs Abs: 1379 cells/uL (ref 850–3900)
MCH: 30.5 pg (ref 27.0–33.0)
MCHC: 33.6 g/dL (ref 32.0–36.0)
MCV: 90.8 fL (ref 80.0–100.0)
MPV: 9.4 fL (ref 7.5–12.5)
Monocytes Relative: 6.7 %
Neutro Abs: 3716 cells/uL (ref 1500–7800)
Neutrophils Relative %: 65.2 %
Platelets: 219 10*3/uL (ref 140–400)
RBC: 4.79 10*6/uL (ref 4.20–5.80)
RDW: 12.7 % (ref 11.0–15.0)
Total Lymphocyte: 24.2 %
WBC: 5.7 10*3/uL (ref 3.8–10.8)

## 2021-07-11 LAB — MICROALBUMIN / CREATININE URINE RATIO
Creatinine, Urine: 44 mg/dL (ref 20–320)
Microalb, Ur: <0.2 mg/dL

## 2021-07-11 LAB — COMPLETE METABOLIC PANEL WITH GFR
AG Ratio: 1.7 (calc) (ref 1.0–2.5)
ALT: 25 U/L (ref 9–46)
AST: 26 U/L (ref 10–35)
Albumin: 4.2 g/dL (ref 3.6–5.1)
Alkaline phosphatase (APISO): 72 U/L (ref 35–144)
BUN: 15 mg/dL (ref 7–25)
CO2: 27 mmol/L (ref 20–32)
Calcium: 9.2 mg/dL (ref 8.6–10.3)
Chloride: 106 mmol/L (ref 98–110)
Creat: 1.1 mg/dL (ref 0.70–1.35)
Globulin: 2.5 g/dL (calc) (ref 1.9–3.7)
Glucose, Bld: 78 mg/dL (ref 65–99)
Potassium: 4.2 mmol/L (ref 3.5–5.3)
Sodium: 140 mmol/L (ref 135–146)
Total Bilirubin: 1.1 mg/dL (ref 0.2–1.2)
Total Protein: 6.7 g/dL (ref 6.1–8.1)
eGFR: 77 mL/min/{1.73_m2} (ref >=60)

## 2021-07-11 LAB — HEMOGLOBIN A1C
Hgb A1c MFr Bld: 5.2 % of total Hgb (ref <5.7)
Mean Plasma Glucose: 103 mg/dL
eAG (mmol/L): 5.7 mmol/L

## 2021-07-11 LAB — MAGNESIUM: Magnesium: 2.1 mg/dL (ref 1.5–2.5)

## 2021-07-11 LAB — VITAMIN B12: Vitamin B-12: 490 pg/mL (ref 200–1100)

## 2021-07-11 LAB — URINALYSIS, ROUTINE W REFLEX MICROSCOPIC
Bilirubin Urine: NEGATIVE
Glucose, UA: NEGATIVE
Hgb urine dipstick: NEGATIVE
Ketones, ur: NEGATIVE
Leukocytes,Ua: NEGATIVE
Nitrite: NEGATIVE
Protein, ur: NEGATIVE
Specific Gravity, Urine: 1.006 (ref 1.001–1.035)
pH: 6.5 (ref 5.0–8.0)

## 2021-07-11 LAB — LIPID PANEL
Cholesterol: 112 mg/dL (ref <200)
HDL: 49 mg/dL (ref >=40)
LDL Cholesterol (Calc): 48 mg/dL (calc)
Non-HDL Cholesterol (Calc): 63 mg/dL (calc) (ref <130)
Total CHOL/HDL Ratio: 2.3 (calc) (ref <5.0)
Triglycerides: 71 mg/dL (ref <150)

## 2021-07-11 LAB — VITAMIN D 25 HYDROXY (VIT D DEFICIENCY, FRACTURES): Vit D, 25-Hydroxy: 79 ng/mL (ref 30–100)

## 2021-07-11 LAB — INSULIN, RANDOM: Insulin: 5.3 u[IU]/mL

## 2021-07-11 LAB — IRON, TOTAL/TOTAL IRON BINDING CAP
%SAT: 39 % (calc) (ref 20–48)
Iron: 108 ug/dL (ref 50–180)
TIBC: 275 mcg/dL (calc) (ref 250–425)

## 2021-07-11 LAB — TESTOSTERONE: Testosterone: 451 ng/dL (ref 250–827)

## 2021-07-11 LAB — PSA: PSA: 0.55 ng/mL (ref <=4.00)

## 2021-07-11 LAB — TSH: TSH: 3.21 mIU/L (ref 0.40–4.50)

## 2021-07-11 NOTE — Progress Notes (Signed)
<><><><><><><><><><><><><><><><><><><><><><><><><><><><><><><><><> ?<><><><><><><><><><><><><><><><><><><><><><><><><><><><><><><><><> ?-   Test results slightly outside the reference range are not unusual. ?If there is anything important, I will review this with you,  ?otherwise it is considered normal test values.  ?If you have further questions,  ?please do not hesitate to contact me at the office or via My Chart.  ?<><><><><><><><><><><><><><><><><><><><><><><><><><><><><><><><><> ?<><><><><><><><><><><><><><><><><><><><><><><><><><><><><><><><><> ? ?-  Iron & Vitamin B12 level - Both Normal & OK ?<><><><><><><><><><><><><><><><><><><><><><><><><><><><><><><><><> ?<><><><><><><><><><><><><><><><><><><><><><><><><><><><><><><><><> ? ?-  PSA - Very Low - Great! ?<><><><><><><><><><><><><><><><><><><><><><><><><><><><><><><><><> ?<><><><><><><><><><><><><><><><><><><><><><><><><><><><><><><><><> ? ?-  Total Chol = 1123     &  LDL Chol = 48       -    Both  Excellent  ? ?- Very low risk for Heart Attack  / Stroke ?============================================================ ?============================================================ ? ?-  Thyroid is in Normal  - Please keep dose same ?<><><><><><><><><><><><><><><><><><><><><><><><><><><><><><><><><> ?<><><><><><><><><><><><><><><><><><><><><><><><><><><><><><><><><> ? ?-  A1c - Normal - No Diabetes   - Great   ! ?<><><><><><><><><><><><><><><><><><><><><><><><><><><><><><><><><> ?<><><><><><><><><><><><><><><><><><><><><><><><><><><><><><><><><> ? ?-  Vitamin D = 79 - Excellent - Please keep dose same  ?<><><><><><><><><><><><><><><><><><><><><><><><><><><><><><><><><> ?<><><><><><><><><><><><><><><><><><><><><><><><><><><><><><><><><> ? ?-  All Else - CBC - Kidneys - Electrolytes - Liver - Magnesium & Thyroid   ? ?- all  Normal /  OK ?<><><><><><><><><><><><><><><><><><><><><><><><><><><><><><><><><> ?<><><><><><><><><><><><><><><><><><><><><><><><><><><><><><><><><> ? ?-  Keep up the [place] Work   ! ? ?<><><><><><><><><><><><><><><><><><><><><><><><><><><><><><><><><> ?<><><><><><><><><><><><><><><><><><><><><><><><><><><><><><><><><> ? ? ? ? ? ? ? ? ? ? ? ? ? ? ? ? ? ? ? ? ? ? ? ? ? ? ? ? ? ? ? ? ? ? ? ? ?

## 2021-08-07 ENCOUNTER — Ambulatory Visit: Payer: 59 | Admitting: Thoracic Surgery (Cardiothoracic Vascular Surgery)

## 2021-08-07 ENCOUNTER — Ambulatory Visit
Admission: RE | Admit: 2021-08-07 | Discharge: 2021-08-07 | Disposition: A | Discharge status: Home / Self Care | Payer: PRIVATE HEALTH INSURANCE | Source: Ambulatory Visit | Attending: Thoracic Surgery (Cardiothoracic Vascular Surgery) | Admitting: Thoracic Surgery (Cardiothoracic Vascular Surgery)

## 2021-08-07 ENCOUNTER — Encounter: Payer: Self-pay | Admitting: Thoracic Surgery (Cardiothoracic Vascular Surgery)

## 2021-08-07 VITALS — BP 143/94 | HR 64 | Resp 20 | Ht 69.0 in | Wt 168.0 lb

## 2021-08-07 DIAGNOSIS — I7121 Aneurysm of the ascending aorta, without rupture: Secondary | ICD-10-CM | POA: Diagnosis not present

## 2021-08-07 DIAGNOSIS — I712 Thoracic aortic aneurysm, without rupture, unspecified: Secondary | ICD-10-CM

## 2021-08-07 MED ORDER — IOPAMIDOL (ISOVUE-370) INJECTION 76%
75.0000 mL | Freq: Once | INTRAVENOUS | Status: AC | PRN
  Administered 2021-08-07: 75 mL via INTRAVENOUS

## 2021-08-07 NOTE — Progress Notes (Signed)
? ?   ?[address] ?      [place] [id number] ?            [phone]   ? ?HPI: Randy Allen returns for follow-up of his ascending aneurysm. ? 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 ? ?He has been followed for an ascending aneurysm since 2021.  It was 5.2 cm on the CT of the chest at that time.  Last 2D echo showed an aortic valve area of 1.2 cm?Marland Kitchen  He is followed by Randy Allen. ? ?I last saw him in October.  He was doing well at that time and the aneurysm was stable. ? ?In the interim since his last visit he continues to feel well.  He has been very active.  He has not been checking his blood pressure much at home but recently saw Randy Allen.  It was normal at that time. ? ?Past Medical History:  ?Diagnosis Date  ? Dental crowns present   ? Heart murmur   ? states need for antibiotics prior to dental procedures; no cardiologist  ? History of hepatitis A   ? age 62  ? History of MRSA infection 2015  ? right great toe  ? Hyperlipidemia   ? Hypertension   ? states under control with med., has been on med. > 10 yr.  ? Hypothyroidism   ? Mass of skin of right shoulder 04/2015  ? Neck mass 04/2015  ? posterior  ? Seasonal allergies   ? Vitamin D deficiency   ? ? ?Current Outpatient Medications  ?Medication Sig Dispense Refill  ? amoxicillin (AMOXIL) 500 MG capsule TAKE 4 CAPSULES BY MOUTH ONE HOUR BEFORE DENTAL PROCEDURE FOR BICUSPID AORTIC VALVE 20 capsule 0  ? Ascorbic Acid (VITAMIN C PO) Take by mouth daily.    ? aspirin EC 81 MG tablet Take 81 mg by mouth daily.    ? atorvastatin (LIPITOR) 40 MG tablet Take  1 tablet  Daily  for Cholesterol 90 tablet 3  ? B Complex Vitamins (VITAMIN B COMPLEX PO) Take by mouth daily.    ? calcium carbonate (TUMS - DOSED IN MG ELEMENTAL CALCIUM) 500 MG chewable tablet Chew 1 tablet by mouth daily.    ? cetirizine  (ZYRTEC) 10 MG tablet Take 10 mg by mouth daily. OTC    ? Cholecalciferol (VITAMIN D PO) Take 5,000 Int'l Units by mouth daily.     ? enalapril (VASOTEC) 10 MG tablet Take  1 tablet  Daily  for BP 90 tablet 3  ? famotidine (PEPCID) 40 MG tablet Take 1 tablet daily for Heart burn & Acid Indigestion 90 tablet 3  ? ipratropium (ATROVENT) 0.06 % nasal spray Place 2 sprays into the nose 3 (three) times daily. 45 mL 2  ? levothyroxine (SYNTHROID) 50 MCG tablet Take  1 tablet  Daily  on an empty stomach with only water for 30 minutes & no Antacid meds, Calcium or Magnesium for 4 hours & avoid Biotin 90 tablet 3  ? OVER THE COUNTER MEDICATION OTC Flonase PRN    ? ?No current facility-administered medications for this visit.  ? ? ?Physical Exam ?BP (!) 143/94 (BP Location: Right Arm, Patient Position: Sitting)   Pulse 64   Resp 20   Ht '5\' 9"'$  (1.753 m)   Wt 168 lb (76.2 kg)   SpO2 98% Comment:  RA  BMI 24.41 kg/m?  ?61 year old man in no acute distress ?Well-developed well-nourished ?Alert and oriented x3 with no focal deficits ?Cardiac regular rate and rhythm with a 2/6 systolic murmur ?Carotids with transmitted murmur ?Lungs clear ?No peripheral edema ? ?Diagnostic Tests: ?CT ANGIOGRAPHY CHEST WITH CONTRAST ?  ?TECHNIQUE: ?Multidetector CT imaging of the chest was performed using the ?standard protocol during bolus administration of intravenous ?contrast. Multiplanar CT image reconstructions and MIPs were ?obtained to evaluate the vascular anatomy. ?  ?RADIATION DOSE REDUCTION: This exam was performed according to the ?departmental dose-optimization program which includes automated ?exposure control, adjustment of the mA and/or kV according to ?patient size and/or use of iterative reconstruction technique. ?  ?CONTRAST:  18m ISOVUE-370 IOPAMIDOL (ISOVUE-370) INJECTION 76% ?  ?COMPARISON:  Comparison made with January 30, 2021. ?  ?FINDINGS: ?Cardiovascular: Ascending thoracic aorta at 5 cm. Stable in coronal ?and  axial plane compared to previous imaging. Aortic root at 4 cm. ?Sinuses of Valsalva at approximately 4 cm. 3 cm at the level of the ?aortic valve. Standard three-vessel branching pattern. Descending ?thoracic aorta with normal caliber. Calcifications of the aortic ?valve. ?  ?Heart size is normal without pericardial effusion. ?  ?Mediastinum/Nodes: No thoracic inlet, axillary, mediastinal or hilar ?adenopathy. Esophagus grossly normal. ?  ?Lungs/Pleura: Lungs are clear.  Airways are patent.  No effusion. ?  ?Upper Abdomen: Incidental imaging of upper abdominal contents shows ?no acute process. Imaged portions the liver, gallbladder, pancreas, ?spleen, adrenal glands and kidneys are unremarkable. ?Gastrointestinal tract without acute findings to the extent ?evaluated.w ?  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 Maximal caliber as measured in the coronal plane ?  ?Musculoskeletal: No chest wall abnormality. No acute or significant ?osseous findings. ?  ?Review of the MIP images confirms the above findings. ?  ?IMPRESSION: ?1. Stable ascending thoracic aorta at 5 cm. No acute process. ?Ascending thoracic aortic aneurysm. Recommend semi-annual imaging ?followup by CTA or MRA and referral to cardiothoracic surgery if not ?already obtained. This recommendation follows 2010 ?ACCF/AHA/AATS/ACR/ASA/SCA/SCAI/SIR/STS/SVM Guidelines for the ?Diagnosis and Management of Patients With Thoracic Aortic Disease. ?Circulation. 2010; 121:: Y073-X106 Aortic aneurysm NOS (ICD10-I71.9) ?2. Calcifications of the aortic valve. ?3. Stable focal dissection with either dissection within small ?aneurysm or dissection with small associated pseudoaneurysm is ?unchanged over a series of prior studies involving the SMA. ?  ?Aortic Atherosclerosis (ICD10-I70.0). ?  ?  ?Electronically Signed ?  By: GZetta BillsM.D. ?  On: 08/07/2021 12:40 ?I personally reviewed the CT images.  Ascending aneurysm is  stable, as is small SMA aneurysm. ? ?Impression: ?DIven [name] a 61year old man with a history of a bicuspid aortic valve, heart murmur, moderate aortic stenosis, ascending aneurysm, hypertension, hyperlipidemia, and hypothyroidism. ? ?Ascending aneurysm-stable at about 5 to 5.1 cm.  Needs continued semiannual follow-up.  Importance of blood pressure control was emphasized. ? ?Bicuspid aortic valve-moderate stenosis on most recent echo about a year ago.  Followed by Dr. RHarrington Challengerfor that. ? ?Hypertension-blood pressure mildly elevated today.  It was normal about a month ago.  He has a cuff at home and used to check himself on a regular basis.  I recommend that he go back to checking himself on a regular basis and we have a target of a systolic pressure of less than 130. ? ?Plan: ?Return in 6 months with CT angio of chest ? ?SMelrose Nakayama MD ?[facility] ?[phone]? ? ? ? ?

## 2021-10-10 ENCOUNTER — Encounter: Payer: Self-pay | Admitting: Nurse Practitioner

## 2021-10-10 ENCOUNTER — Ambulatory Visit (INDEPENDENT_AMBULATORY_CARE_PROVIDER_SITE_OTHER): Payer: BC Managed Care – PPO | Admitting: Nurse Practitioner

## 2021-10-10 VITALS — BP 118/82 | HR 70 | Temp 97.5°F | Wt 170.8 lb

## 2021-10-10 DIAGNOSIS — I7121 Aneurysm of the ascending aorta, without rupture: Secondary | ICD-10-CM

## 2021-10-10 DIAGNOSIS — Q231 Congenital insufficiency of aortic valve: Secondary | ICD-10-CM | POA: Diagnosis not present

## 2021-10-10 DIAGNOSIS — I10 Essential (primary) hypertension: Secondary | ICD-10-CM

## 2021-10-10 DIAGNOSIS — E039 Hypothyroidism, unspecified: Secondary | ICD-10-CM

## 2021-10-10 DIAGNOSIS — N182 Chronic kidney disease, stage 2 (mild): Secondary | ICD-10-CM

## 2021-10-10 DIAGNOSIS — E559 Vitamin D deficiency, unspecified: Secondary | ICD-10-CM

## 2021-10-10 DIAGNOSIS — K589 Irritable bowel syndrome without diarrhea: Secondary | ICD-10-CM

## 2021-10-10 DIAGNOSIS — R7309 Other abnormal glucose: Secondary | ICD-10-CM

## 2021-10-10 DIAGNOSIS — I728 Aneurysm of other specified arteries: Secondary | ICD-10-CM

## 2021-10-10 DIAGNOSIS — Z79899 Other long term (current) drug therapy: Secondary | ICD-10-CM

## 2021-10-10 DIAGNOSIS — E782 Mixed hyperlipidemia: Secondary | ICD-10-CM

## 2021-10-10 NOTE — Progress Notes (Signed)
FOLLOW UP  Assessment and Plan:   1. Essential hypertension Controlled  Continue medications;  Discussed DASH (Dietary Approaches to Stop Hypertension) DASH diet is lower in sodium than a typical American diet. Cut back on foods that are high in saturated fat, cholesterol, and trans fats. Eat more whole-grain foods, fish, poultry, and nuts Remain active and exercise as tolerated daily.  Monitor BP at home-Call if greater than 130/80.    2. Aneurysm of ascending aorta without rupture (HCC) Continue following with Dr. Roxan Hockey for TAA.  3. Bicuspid aortic valve with ascending aorta 4.0 to 4.5 cm in diameter Continue following with Dr. Harrington Challenger [facility]iology (Bicuspid Aortic Valve) - Congenital  4. Superior mesenteric artery aneurysm (HCC) Continue following with Dr. Roxan Hockey for TAA.  5. Irritable bowel syndrome, unspecified type Controlled.   Continue to monitor.  6. Hypothyroidism, unspecified type Controlled. Continue to monitor   7. CKD (chronic kidney disease) stage 2, GFR 60-89 ml/min Discussed how what you eat and drink can aide in kidney protection. Stay well hydrated. Avoid high salt foods. Avoid NSAIDS. Keep BP and BG well controlled.   Take medications as prescribed. Remain active and exercise as tolerated daily. Maintain weight.  Continue to monitor.  8. Vitamin D deficiency At goal. Continue to monitor   9. Hyperlipidemia Controlled Continue medications;  Discussed lifestyle modifications. Recommended diet heavy in fruits and veggies, omega 3's. Decrease consumption of animal meats, cheeses, and dairy products. Remain active and exercise as tolerated. Continue to monitor.   10. Abnormal glucose Controlled. Continue to monitor   11. Medication management All medications discussed and reviewed in full. All questions and concerns regarding medications addressed.     Continue diet and meds as discussed. Further disposition pending results of  labs. Discussed med's effects and SE's.   Over 30 minutes of exam, counseling, chart review, and critical decision making was performed.   Future Appointments  Date Time Provider [place]  01/10/2022  4:00 PM Unk Pinto, MD [facility] None  07/17/2022 10:00 AM Unk Pinto, MD [facility] None    ----------------------------------------------------------------------------------------------------------------------  HPI 61 y.o. male  presents for 3 month follow up on hypertension, cholesterol, diabetes, weight and vitamin D deficiency.   Overall he reports feeling well.  No new concerns.  Had recent annual physcial 06/2021 with Dr. Eddie North.    He continues to follow with Dr. Merilynn Finland for TAA dn Dr. Dorris Carnes for a congenital Bicuspid Aortic Valve.  Follows every 6 mo.  Has no recent concerning symptoms or limitations.  Reports that Dr. Roxan Hockey is requesting for Dr. Harrington Challenger to complete and updated [place].  He has not yet completed.  BMI is Body mass index is 25.22 kg/m., he has not been working on diet and exercise. Wt Readings from Last 3 Encounters:  10/10/21 170 lb 12.8 oz (77.5 kg)  08/07/21 168 lb (76.2 kg)  07/10/21 172 lb 9.6 oz (78.3 kg)    His blood pressure has been controlled at home, today their BP is BP: 118/82  He does workout. He denies chest pain, shortness of breath, dizziness.   He is on cholesterol medication Atorvastatin and denies myalgias. His cholesterol is at goal. The cholesterol last visit was:   Lab Results  Component Value Date   CHOL 112 07/10/2021   HDL 49 07/10/2021   LDLCALC 48 07/10/2021   TRIG 71 07/10/2021   CHOLHDL 2.3 07/10/2021    He has been working on diet and exercise for prediabetes, and denies polydipsia and  polyuria. Last A1C in the office was:  Lab Results  Component Value Date   HGBA1C 5.2 07/10/2021   Patient is on Vitamin D supplement.   Lab Results  Component Value Date   VD25OH 79 07/10/2021         Current Medications:  Current Outpatient Medications on File Prior to Visit  Medication Sig   amoxicillin (AMOXIL) 500 MG capsule TAKE 4 CAPSULES BY MOUTH ONE HOUR BEFORE DENTAL PROCEDURE FOR BICUSPID AORTIC VALVE   Ascorbic Acid (VITAMIN C PO) Take by mouth daily.   aspirin EC 81 MG tablet Take 81 mg by mouth daily.   atorvastatin (LIPITOR) 40 MG tablet Take  1 tablet  Daily  for Cholesterol   calcium carbonate (TUMS - DOSED IN MG ELEMENTAL CALCIUM) 500 MG chewable tablet Chew 1 tablet by mouth daily.   cetirizine (ZYRTEC) 10 MG tablet Take 10 mg by mouth daily. OTC   Cholecalciferol (VITAMIN D PO) Take 5,000 Int'l Units by mouth daily.    enalapril (VASOTEC) 10 MG tablet Take  1 tablet  Daily  for BP   famotidine (PEPCID) 40 MG tablet Take 1 tablet daily for Heart burn & Acid Indigestion   levothyroxine (SYNTHROID) 50 MCG tablet Take  1 tablet  Daily  on an empty stomach with only water for 30 minutes & no Antacid meds, Calcium or Magnesium for 4 hours & avoid Biotin   OVER THE COUNTER MEDICATION OTC Flonase PRN   B Complex Vitamins (VITAMIN B COMPLEX PO) Take by mouth daily. (Patient not taking: Reported on 10/10/2021)   ipratropium (ATROVENT) 0.06 % nasal spray Place 2 sprays into the nose 3 (three) times daily. (Patient not taking: Reported on 10/10/2021)   No current facility-administered medications on file prior to visit.     Allergies:  Allergies  Allergen Reactions   Propofol Shortness Of Breath    CHEST TIGHTNESS   Zoloft [Sertraline Hcl] Other (See Comments)    HYPERACTIVE   Quinolones     Aortic aneurysm     Medical History:  Past Medical History:  Diagnosis Date   Dental crowns present    Heart murmur    states need for antibiotics prior to dental procedures; no cardiologist   History of hepatitis A    age 57   History of MRSA infection 2015   right great toe   Hyperlipidemia    Hypertension    states under control with med., has been on med. > 10 yr.    Hypothyroidism    Mass of skin of right shoulder 04/2015   Neck mass 04/2015   posterior   Seasonal allergies    Vitamin D deficiency    Family history- Reviewed and unchanged Social history- Reviewed and unchanged   Review of Systems:  ROS    Physical Exam: BP 118/82   Pulse 70   Temp (!) 97.5 F (36.4 C)   Wt 170 lb 12.8 oz (77.5 kg)   SpO2 97%   BMI 25.22 kg/m  Wt Readings from Last 3 Encounters:  10/10/21 170 lb 12.8 oz (77.5 kg)  08/07/21 168 lb (76.2 kg)  07/10/21 172 lb 9.6 oz (78.3 kg)   General Appearance: Well nourished, in no apparent distress. Eyes: PERRLA, EOMs, conjunctiva no swelling or erythema Sinuses: No Frontal/maxillary tenderness ENT/Mouth: Ext aud canals clear, TMs without erythema, bulging. No erythema, swelling, or exudate on post pharynx.  Tonsils not swollen or erythematous. Hearing normal.  Neck: Supple, thyroid normal.  Respiratory:  Respiratory effort normal, BS equal bilaterally without rales, rhonchi, wheezing or stridor.  Cardio: RRR with no MRGs. Brisk peripheral pulses without edema.  Abdomen: Soft, + BS.  Non tender, no guarding, rebound, hernias, masses. Lymphatics: Non tender without lymphadenopathy.  Musculoskeletal: Full ROM, 5/5 strength, Normal gait Skin: Warm, dry without rashes, lesions, ecchymosis.  Neuro: Cranial nerves intact. No cerebellar symptoms.  Psych: Awake and oriented X 3, normal affect, Insight and Judgment appropriate.    Darrol Jump, NP 4:07 PM [facility]

## 2021-10-10 NOTE — Patient Instructions (Signed)

## 2021-12-21 ENCOUNTER — Other Ambulatory Visit: Payer: Self-pay | Admitting: Thoracic Surgery (Cardiothoracic Vascular Surgery)

## 2021-12-21 DIAGNOSIS — I7121 Aneurysm of the ascending aorta, without rupture: Secondary | ICD-10-CM

## 2022-01-10 ENCOUNTER — Ambulatory Visit (INDEPENDENT_AMBULATORY_CARE_PROVIDER_SITE_OTHER): Payer: BC Managed Care – PPO | Admitting: Internal Medicine

## 2022-01-10 ENCOUNTER — Encounter: Payer: Self-pay | Admitting: Internal Medicine

## 2022-01-10 VITALS — BP 118/80 | HR 68 | Temp 97.9°F | Resp 17 | Ht 69.0 in | Wt 170.0 lb

## 2022-01-10 DIAGNOSIS — E559 Vitamin D deficiency, unspecified: Secondary | ICD-10-CM

## 2022-01-10 DIAGNOSIS — E782 Mixed hyperlipidemia: Secondary | ICD-10-CM

## 2022-01-10 DIAGNOSIS — J302 Other seasonal allergic rhinitis: Secondary | ICD-10-CM

## 2022-01-10 DIAGNOSIS — Z79899 Other long term (current) drug therapy: Secondary | ICD-10-CM | POA: Diagnosis not present

## 2022-01-10 DIAGNOSIS — R7309 Other abnormal glucose: Secondary | ICD-10-CM | POA: Diagnosis not present

## 2022-01-10 DIAGNOSIS — I10 Essential (primary) hypertension: Secondary | ICD-10-CM | POA: Diagnosis not present

## 2022-01-10 DIAGNOSIS — Q231 Congenital insufficiency of aortic valve: Secondary | ICD-10-CM

## 2022-01-10 DIAGNOSIS — E039 Hypothyroidism, unspecified: Secondary | ICD-10-CM

## 2022-01-10 MED ORDER — LEVOTHYROXINE SODIUM 50 MCG PO TABS: ORAL_TABLET | ORAL | 3 refills | Status: DC

## 2022-01-10 MED ORDER — AMOXICILLIN 500 MG PO CAPS: ORAL_CAPSULE | ORAL | 1 refills | Status: DC

## 2022-01-10 MED ORDER — MONTELUKAST SODIUM 10 MG PO TABS: ORAL_TABLET | ORAL | 3 refills | Status: DC

## 2022-01-10 NOTE — Progress Notes (Signed)
Future Appointments  Date Time Provider Department  01/10/2022  4:00 PM Unk Pinto, MD [facility]  02/05/2022  1:30 PM Melrose Nakayama, MD [facility]  07/17/2022 10:00 AM Unk Pinto, MD [facility]    History of Present Illness:       This very nice 61 y.o. MWM  presents for 6  month follow up with HTN, HLD, Pre-Diabetes and Vitamin D Deficiency. Patient is followed by Dr Merilynn Finland for a Thoracic Aortic Aneurysm & Dr Dorris Carnes for a Bicuspid Aortic Valve.         Patient is treated for HTN  (1999)  & BP has been controlled at home. Today's BP is at goal -  118/80 .   Patient has had no complaints of any cardiac type chest pain, palpitations, dyspnea Vertell Limber /PND, dizziness, claudication or dependent edema.       Hyperlipidemia is controlled with diet & meds. Patient denies myalgias or other med SE's. Last Lipids were at goal :  Lab Results  Component Value Date   CHOL 112 07/10/2021   HDL 49 07/10/2021   LDLCALC 48 07/10/2021   TRIG 71 07/10/2021   CHOLHDL 2.3 07/10/2021     Also, the patient has history of PreDiabetes and has had no symptoms of reactive hypoglycemia, diabetic polys, paresthesias or visual blurring.  Last A1c was normal & at goal :  Lab Results  Component Value Date   HGBA1C 5.2 07/10/2021                                                          Further, the patient also has history of Vitamin D Deficiency and supplements vitamin D . Last vitamin D was at goal :  Lab Results  Component Value Date   VD25OH 79 07/10/2021     Current Outpatient Medications on File Prior to Visit  Medication Sig   amoxicillin (AMOXIL) 500 MG capsule TAKE 4 CAPSULES  ONE HOUR BEFORE DENTAL PROCEDURE FOR BICUSPID AORTIC VALVE   VITAMIN C  Take   daily.   aspirin EC 81 MG tablet Take   daily.   atorvastatin 40 MG tablet Take  1 tablet  Daily  for Cholesterol   TUMS 500 MG  Chew 1 tablet  daily.   cetirizine (ZYRTEC) 10 MG tablet Take 1  daily    VITAMIN D\5,000 Units  Take daily.    enalapril (VASOTEC) 10 MG tablet Take  1 tablet  Daily  for BP   famotidine (PEPCID) 40 MG tablet Take 1 tablet daily for Heart burn & Acid Indigestion   levothyroxine 50 MCG tablet Take  1 tablet  Daily    OTC Flonase PRN     Allergies  Allergen Reactions   Propofol Shortness Of Breath    CHEST TIGHTNESS   Zoloft [Sertraline Hcl] Other (See Comments)    HYPERACTIVE   Quinolones     Aortic aneurysm     PMHx:   Past Medical History:  Diagnosis Date   Dental crowns present    Heart murmur    states need for antibiotics prior to dental procedures; no cardiologist   History of hepatitis A    age 35   History of MRSA infection 2015   right great toe   Hyperlipidemia    Hypertension  states under control with med., has been on med. > 10 yr.   Hypothyroidism    Mass of skin of right shoulder 04/2015   Neck mass 04/2015   posterior   Seasonal allergies    Vitamin D deficiency      Immunization History  Administered Date(s) Administered   PFIZER-SARS-COV-2 Vacc 07/08/2019, 07/31/2019, 04/01/2020   PPD Test 06/04/2018, 06/24/2019, 07/04/2020, 07/10/2021   Pneumococcal 12/16/2008   Td 04/15/2005   Tdap 10/27/2013     Past Surgical History:  Procedure Laterality Date   COLONOSCOPY WITH PROPOFOL  10/21/2013   EYE SURGERY Right 1966   HYDROCELE EXCISION     MASS EXCISION Right 04/26/2015   Procedure: EXCISION MASS NECK AND RIGHT SHOULDER ;  Surgeon: Johnathan Hausen, MD;  Location: [facility];  Service: General;  Laterality: Right;    FHx:    Reviewed / unchanged  SHx:    Reviewed / unchanged   Systems Review:  Constitutional: Denies fever, chills, wt changes, headaches, insomnia, fatigue, night sweats, change in appetite. Eyes: Denies redness, blurred vision, diplopia, discharge, itchy, watery eyes.  ENT: Denies discharge, congestion, post nasal drip, epistaxis, sore throat, earache, hearing loss, dental pain,  tinnitus, vertigo, sinus pain, snoring.  CV: Denies chest pain, palpitations, irregular heartbeat, syncope, dyspnea, diaphoresis, orthopnea, PND, claudication or edema. Respiratory: denies cough, dyspnea, DOE, pleurisy, hoarseness, laryngitis, wheezing.  Gastrointestinal: Denies dysphagia, odynophagia, heartburn, reflux, water brash, abdominal pain or cramps, nausea, vomiting, bloating, diarrhea, constipation, hematemesis, melena, hematochezia  or hemorrhoids. Genitourinary: Denies dysuria, frequency, urgency, nocturia, hesitancy, discharge, hematuria or flank pain. Musculoskeletal: Denies arthralgias, myalgias, stiffness, jt. swelling, pain, limping or strain/sprain.  Skin: Denies pruritus, rash, hives, warts, acne, eczema or change in skin lesion(s). Neuro: No weakness, tremor, incoordination, spasms, paresthesia or pain. Psychiatric: Denies confusion, memory loss or sensory loss. Endo: Denies change in weight, skin or hair change.  Heme/Lymph: No excessive bleeding, bruising or enlarged lymph nodes.  Physical Exam  BP 118/80   Pulse 68   Temp 97.9 F (36.6 C)   Resp 17   Ht '5\' 9"'$  (1.753 m)   Wt 170 lb (77.1 kg)   SpO2 98%   BMI 25.10 kg/m   Appears  well nourished, well groomed  and in no distress.  Eyes: PERRLA, EOMs, conjunctiva no swelling or erythema. Sinuses: No frontal/maxillary tenderness ENT/Mouth: EAC's clear, TM's nl w/o erythema, bulging. Nares clear w/o erythema, swelling, exudates. Oropharynx clear without erythema or exudates. Oral hygiene is good. Tongue normal, non obstructing. Hearing intact.  Neck: Supple. Thyroid not palpable. Car 2+/2+ without bruits, nodes or JVD. Chest: Respirations nl with BS clear & equal w/o rales, rhonchi, wheezing or stridor.  Cor: Heart sounds normal w/ regular rate and rhythm without sig. murmurs, gallops, clicks or rubs. Peripheral pulses normal and equal  without edema.  Abdomen: Soft & bowel sounds normal. Non-tender w/o guarding,  rebound, hernias, masses or organomegaly.  Lymphatics: Unremarkable.  Musculoskeletal: Full ROM all peripheral extremities, joint stability, 5/5 strength and normal gait.  Skin: Warm, dry without exposed rashes, lesions or ecchymosis apparent.  Neuro: Cranial nerves intact, reflexes equal bilaterally. Sensory-motor testing grossly intact. Tendon reflexes grossly intact.  Pysch: Alert & oriented x 3.  Insight and judgement nl & appropriate. No ideations.  Assessment and Plan:  1. Essential hypertension  - Continue medication, monitor blood pressure at home.  - Continue DASH diet.  Reminder to go to the ER if any CP,  SOB, nausea, dizziness, severe  HA, changes vision/speech.   - CBC with Differential/Platelet - COMPLETE METABOLIC PANEL WITH GFR - Magnesium - TSH  2. Hyperlipidemia, mixed  - Continue diet/meds, exercise,& lifestyle modifications.  - Continue monitor periodic cholesterol/liver & renal functions    - Lipid panel - TSH  3. Abnormal glucose  - Continue diet, exercise  - Lifestyle modifications.  - Monitor appropriate labs   - Hemoglobin A1c - Insulin, random  4. Vitamin D deficiency  - Continue supplementation    - VITAMIN D 25 Hydroxy   5. Bicuspid aortic valve with ascending aorta 4.0 to 4.5 cm in diameter   6. Hypothyroidism  - TSH  7. Medication management  - CBC with Differential/Platelet - COMPLETE METABOLIC PANEL WITH GFR - Magnesium - Lipid panel - TSH - Hemoglobin A1c - Insulin, random - VITAMIN D 25 Hydroxy          Discussed  regular exercise, BP monitoring, weight control to achieve/maintain BMI less than 25 and discussed med and SE's. Recommended labs to assess /monitor clinical status .  I discussed the assessment and treatment plan with the patient. The patient was provided an opportunity to ask questions and all were answered. The patient agreed with the plan and demonstrated an understanding of the instructions.  I provided over  30 minutes of exam, counseling, chart review and  complex critical decision making.        The patient was advised to call back or seek an in-person evaluation if the symptoms worsen or if the condition fails to improve as anticipated.   Kirtland Bouchard, MD

## 2022-01-10 NOTE — Patient Instructions (Signed)
Due to recent changes in healthcare laws, you may see the results of your imaging and laboratory studies on MyChart before your provider has had a chance to review them.  We understand that in some cases there may be results that are confusing or concerning to you. Not all laboratory results come back in the same time frame and the provider may be waiting for multiple results in order to interpret others.  Please give us 48 hours in order for your provider to thoroughly review all the results before contacting the office for clarification of your results.  ++++++++++++++++++++++++++  Vit D  & Vit C 1,000 mg   are recommended to help protect  against the Covid-19 and other Corona viruses.    Also it's recommended  to take  Zinc 50 mg  to help  protect against the Covid-19   and [facility] place to get  is also on [employer]  and don't pay more than 6-8 cents /pill !   +++++++++++++++++++++++++++++++++++++++ Recommend Adult Low Dose Aspirin or  coated  Aspirin 81 mg daily  To reduce risk of Colon Cancer 40 %,  Skin Cancer 26 % ,  Melanoma 46%  and  Pancreatic cancer 60% +++++++++++++++++++++++++++++++++++++++++ Vitamin D goal  is between 70-100.  Please make sure that you are taking your Vitamin D as directed.  It is very important as a natural anti-inflammatory  helping hair, skin, and nails, as well as reducing stroke and heart attack risk.  It helps your bones and helps with mood. It also decreases numerous cancer risks so please take it as directed.  Low Vit D is associated with a 200-300% higher risk for CANCER  and 200-300% higher risk for HEART   ATTACK  &  STROKE.   ...................................... It is also associated with higher death rate at younger ages,  autoimmune diseases like Rheumatoid arthritis, Lupus, Multiple Sclerosis.    Also many other serious conditions, like depression, Alzheimer's Dementia, infertility, muscle aches, fatigue, fibromyalgia - just to name  a few. +++++++++++++++++++++++++++++++++++++++++ Recommend the book "The END of DIETING" by Dr Joel Fuhrman  & the book "The END of DIABETES " by Dr Joel Fuhrman At [employer] - get book & Audio CD's    Being diabetic has a  300% increased risk for heart attack, stroke, cancer, and alzheimer- type vascular dementia. It is very important that you work harder with diet by avoiding all foods that are white. Avoid white rice (brown & wild rice is OK), white potatoes (sweetpotatoes in moderation is OK), White bread or wheat bread or anything made out of white flour like bagels, donuts, rolls, buns, biscuits, cakes, pastries, cookies, pizza crust, and pasta (made from white flour & egg whites) - vegetarian pasta or spinach or wheat pasta is OK. Multigrain breads like Arnold's or [employer], or multigrain sandwich thins or flatbreads.  Diet, exercise and weight loss can reverse and cure diabetes in the early stages.  Diet, exercise and weight loss is very important in the control and prevention of complications of diabetes which affects every system in your body, ie. Brain - dementia/stroke, eyes - glaucoma/blindness, heart - heart attack/heart failure, kidneys - dialysis, stomach - gastric paralysis, intestines - malabsorption, nerves - severe painful neuritis, circulation - gangrene & loss of a leg(s), and finally cancer and Alzheimers.    I recommend avoid fried & greasy foods,  sweets/candy, white rice (brown or wild rice or Quinoa is OK), white potatoes (sweet potatoes are OK) - anything   made from white flour - bagels, doughnuts, rolls, buns, biscuits,white and wheat breads, pizza crust and traditional pasta made of white flour & egg white(vegetarian pasta or spinach or wheat pasta is OK).  Multi-grain bread is OK - like multi-grain flat bread or sandwich thins. Avoid alcohol in excess. Exercise is also important.    Eat all the vegetables you want - avoid meat, especially red meat and dairy - especially  cheese.  Cheese is the most concentrated form of trans-fats which is the worst thing to clog up our arteries. Veggie cheese is OK which can be found in the fresh produce section at [employer] or [employer] or [employer]  +++++++++++++++++++++++++++++++++++++++ DASH Eating Plan  DASH stands for "Dietary Approaches to Stop Hypertension."   The DASH eating plan is a healthy eating plan that has been shown to reduce high blood pressure (hypertension). Additional health benefits may include reducing the risk of type 2 diabetes mellitus, heart disease, and stroke. The DASH eating plan may also help with weight loss. WHAT DO I NEED TO KNOW ABOUT THE DASH EATING PLAN? For the DASH eating plan, you will follow these general guidelines: Choose foods with a percent daily value for sodium of less than 5% (as listed on the food label). Use salt-free seasonings or herbs instead of table salt or sea salt. Check with your health care provider or pharmacist before using salt substitutes. Eat lower-sodium products, often labeled as "lower sodium" or "no salt added." Eat fresh foods. Eat more vegetables, fruits, and low-fat dairy products. Choose whole grains. Look for the word "whole" as the first word in the ingredient list. Choose fish  Limit sweets, desserts, sugars, and sugary drinks. Choose heart-healthy fats. Eat veggie cheese  Eat more home-cooked food and less restaurant, buffet, and fast food. Limit fried foods. Cook foods using methods other than frying. Limit canned vegetables. If you do use them, rinse them well to decrease the sodium. When eating at a restaurant, ask that your food be prepared with less salt, or no salt if possible.                      WHAT FOODS CAN I EAT? Read Dr Joel Fuhrman's books on The End of Dieting & The End of Diabetes  Grains Whole grain or whole wheat bread. Brown rice. Whole grain or whole wheat pasta. Quinoa, bulgur, and whole grain cereals. Low-sodium  cereals. Corn or whole wheat flour tortillas. Whole grain cornbread. Whole grain crackers. Low-sodium crackers.  Vegetables Fresh or frozen vegetables (raw, steamed, roasted, or grilled). Low-sodium or reduced-sodium tomato and vegetable juices. Low-sodium or reduced-sodium tomato sauce and paste. Low-sodium or reduced-sodium canned vegetables.   Fruits All fresh, canned (in natural juice), or frozen fruits.  Protein Products  All fish and seafood.  Dried beans, peas, or lentils. Unsalted nuts and seeds. Unsalted canned beans.  Dairy Low-fat dairy products, such as skim or 1% milk, 2% or reduced-fat cheeses, low-fat ricotta or cottage cheese, or plain low-fat yogurt. Low-sodium or reduced-sodium cheeses.  Fats and Oils Tub margarines without trans fats. Light or reduced-fat mayonnaise and salad dressings (reduced sodium). Avocado. Safflower, olive, or canola oils. Natural peanut or almond butter.  Other Unsalted popcorn and pretzels. The items listed above may not be a complete list of recommended foods or beverages. Contact your dietitian for more options.  +++++++++++++++  WHAT FOODS ARE NOT RECOMMENDED? Grains/ White flour or wheat flour White bread. White pasta. White rice. Refined   cornbread. Bagels and croissants. Crackers that contain trans fat.  Vegetables  Creamed or fried vegetables. Vegetables in a . Regular canned vegetables. Regular canned tomato sauce and paste. Regular tomato and vegetable juices.  Fruits Dried fruits. Canned fruit in light or heavy syrup. Fruit juice.  Meat and Other Protein Products Meat in general - RED meat & White meat.  Fatty cuts of meat. Ribs, chicken wings, all processed meats as bacon, sausage, bologna, salami, fatback, hot dogs, bratwurst and packaged luncheon meats.  Dairy Whole or 2% milk, cream, half-and-half, and cream cheese. Whole-fat or sweetened yogurt. Full-fat cheeses or blue cheese. Non-dairy creamers and whipped toppings.  Processed cheese, cheese spreads, or cheese curds.  Condiments Onion and garlic salt, seasoned salt, table salt, and sea salt. Canned and packaged gravies. Worcestershire sauce. Tartar sauce. Barbecue sauce. Teriyaki sauce. Soy sauce, including reduced sodium. Steak sauce. Fish sauce. Oyster sauce. Cocktail sauce. Horseradish. Ketchup and mustard. Meat flavorings and tenderizers. Bouillon cubes. Hot sauce. Tabasco sauce. Marinades. Taco seasonings. Relishes.  Fats and Oils Butter, stick margarine, lard, shortening and bacon fat. Coconut, palm kernel, or palm oils. Regular salad dressings.  Pickles and olives. Salted popcorn and pretzels.  The items listed above may not be a complete list of foods and beverages to avoid.  

## 2022-01-11 ENCOUNTER — Encounter: Payer: Self-pay | Admitting: Internal Medicine

## 2022-01-11 LAB — COMPLETE METABOLIC PANEL WITH GFR
AG Ratio: 1.7 (calc) (ref 1.0–2.5)
ALT: 22 U/L (ref 9–46)
AST: 19 U/L (ref 10–35)
Albumin: 4.1 g/dL (ref 3.6–5.1)
Alkaline phosphatase (APISO): 77 U/L (ref 35–144)
BUN: 15 mg/dL (ref 7–25)
CO2: 29 mmol/L (ref 20–32)
Calcium: 9.4 mg/dL (ref 8.6–10.3)
Chloride: 107 mmol/L (ref 98–110)
Creat: 1.13 mg/dL (ref 0.70–1.35)
Globulin: 2.4 g/dL (calc) (ref 1.9–3.7)
Glucose, Bld: 117 mg/dL — ABNORMAL HIGH (ref 65–99)
Potassium: 4.1 mmol/L (ref 3.5–5.3)
Sodium: 141 mmol/L (ref 135–146)
Total Bilirubin: 0.5 mg/dL (ref 0.2–1.2)
Total Protein: 6.5 g/dL (ref 6.1–8.1)
eGFR: 74 mL/min/{1.73_m2} (ref >=60)

## 2022-01-11 LAB — CBC WITH DIFFERENTIAL/PLATELET
Absolute Monocytes: 390 cells/uL (ref 200–950)
Basophils Absolute: 18 cells/uL (ref 0–200)
Basophils Relative: 0.3 %
Eosinophils Absolute: 360 cells/uL (ref 15–500)
Eosinophils Relative: 6 %
HCT: 42.8 % (ref 38.5–50.0)
Hemoglobin: 14.5 g/dL (ref 13.2–17.1)
Lymphs Abs: 1884 cells/uL (ref 850–3900)
MCH: 30.7 pg (ref 27.0–33.0)
MCHC: 33.9 g/dL (ref 32.0–36.0)
MCV: 90.7 fL (ref 80.0–100.0)
MPV: 9.6 fL (ref 7.5–12.5)
Monocytes Relative: 6.5 %
Neutro Abs: 3348 cells/uL (ref 1500–7800)
Neutrophils Relative %: 55.8 %
Platelets: 237 10*3/uL (ref 140–400)
RBC: 4.72 10*6/uL (ref 4.20–5.80)
RDW: 12.7 % (ref 11.0–15.0)
Total Lymphocyte: 31.4 %
WBC: 6 10*3/uL (ref 3.8–10.8)

## 2022-01-11 LAB — TSH: TSH: 2.83 mIU/L (ref 0.40–4.50)

## 2022-01-11 LAB — LIPID PANEL
Cholesterol: 103 mg/dL (ref <200)
HDL: 43 mg/dL (ref >=40)
LDL Cholesterol (Calc): 38 mg/dL (calc)
Non-HDL Cholesterol (Calc): 60 mg/dL (calc) (ref <130)
Total CHOL/HDL Ratio: 2.4 (calc) (ref <5.0)
Triglycerides: 140 mg/dL (ref <150)

## 2022-01-11 LAB — HEMOGLOBIN A1C
Hgb A1c MFr Bld: 5.2 % of total Hgb (ref <5.7)
Mean Plasma Glucose: 103 mg/dL
eAG (mmol/L): 5.7 mmol/L

## 2022-01-11 LAB — VITAMIN D 25 HYDROXY (VIT D DEFICIENCY, FRACTURES): Vit D, 25-Hydroxy: 82 ng/mL (ref 30–100)

## 2022-01-11 LAB — MAGNESIUM: Magnesium: 2 mg/dL (ref 1.5–2.5)

## 2022-01-11 LAB — INSULIN, RANDOM: Insulin: 66.9 u[IU]/mL — ABNORMAL HIGH

## 2022-01-12 NOTE — Progress Notes (Signed)
<><><><><><><><><><><><><><><><><><><><><><><><><><><><><><><><><> <><><><><><><><><><><><><><><><><><><><><><><><><><><><><><><><><> -   Test results slightly outside the reference range are not unusual. If there is anything important, I will review this with you,  otherwise it is considered normal test values.  If you have further questions,  please do not hesitate to contact me at the office or via My Chart.  <><><><><><><><><><><><><><><><><><><><><><><><><><><><><><><><><> <><><><><><><><><><><><><><><><><><><><><><><><><><><><><><><><><>  -  Total Chol = 103    &   LDL Chol = 38  - Both  Excellent   - Very low risk for Heart Attack  / Stroke <><><><><><><><><><><><><><><><><><><><><><><><><><><><><><><><><>  -   A1c - Normal - No Diabetes  -  Great !  <><><><><><><><><><><><><><><><><><><><><><><><><><><><><><><><><>  -  Vitamin D = 82  - Excellent   - Please keep dose same  <><><><><><><><><><><><><><><><><><><><><><><><><><><><><><><><><>  -  All Else - CBC - Kidneys - Electrolytes - Liver - Magnesium & Thyroid    - all  Normal / OK <><><><><><><><><><><><><><><><><><><><><><><><><><><><><><><><><> <><><><><><><><><><><><><><><><><><><><><><><><><><><><><><><><><>  -  Keep up the [place] Work !  <><><><><><><><><><><><><><><><><><><><><><><><><><><><><><><><><> <><><><><><><><><><><><><><><><><><><><><><><><><><><><><><><><><>

## 2022-02-05 ENCOUNTER — Ambulatory Visit
Admission: RE | Admit: 2022-02-05 | Discharge: 2022-02-05 | Disposition: A | Discharge status: Home / Self Care | Payer: 59 | Source: Ambulatory Visit | Attending: Thoracic Surgery (Cardiothoracic Vascular Surgery) | Admitting: Thoracic Surgery (Cardiothoracic Vascular Surgery)

## 2022-02-05 ENCOUNTER — Ambulatory Visit: Payer: BC Managed Care – PPO | Admitting: Thoracic Surgery (Cardiothoracic Vascular Surgery)

## 2022-02-05 ENCOUNTER — Encounter: Payer: Self-pay | Admitting: Thoracic Surgery (Cardiothoracic Vascular Surgery)

## 2022-02-05 ENCOUNTER — Other Ambulatory Visit: Payer: 59

## 2022-02-05 VITALS — BP 140/86 | HR 62 | Resp 20 | Ht 69.0 in | Wt 168.0 lb

## 2022-02-05 DIAGNOSIS — I712 Thoracic aortic aneurysm, without rupture, unspecified: Secondary | ICD-10-CM | POA: Diagnosis not present

## 2022-02-05 DIAGNOSIS — I7121 Aneurysm of the ascending aorta, without rupture: Secondary | ICD-10-CM

## 2022-02-05 DIAGNOSIS — I728 Aneurysm of other specified arteries: Secondary | ICD-10-CM | POA: Diagnosis not present

## 2022-02-05 MED ORDER — IOPAMIDOL (ISOVUE-370) INJECTION 76%
75.0000 mL | Freq: Once | INTRAVENOUS | Status: AC | PRN
  Administered 2022-02-05: 75 mL via INTRAVENOUS

## 2022-02-05 NOTE — Progress Notes (Signed)
[address]       [place] [address]             [phone]      HPI: Randy Allen returns for a scheduled postoperative follow-up.  Randy Allen is a 61 year old man with a history significant for bicuspid aortic valve, moderate aortic stenosis, ascending aortic aneurysm, hypertension, hyperlipidemia, and hypothyroidism.  He has been followed for an ascending aneurysm since 2021.  On the initial finding it was reported as being 5.2 cm.  It has remained stable.  He had mild to moderate aortic stenosis on his last echocardiogram in February 2022.  In the interim since his last visit he has been feeling well.  He monitors his blood pressure at home and it has been well controlled with systolics less than 485.  No chest pain, pressure, tightness, or shortness of breath.  Past Medical History:  Diagnosis Date   Dental crowns present    Heart murmur    states need for antibiotics prior to dental procedures; no cardiologist   History of hepatitis A    age 48   History of MRSA infection 2015   right great toe   Hyperlipidemia    Hypertension    states under control with med., has been on med. > 10 yr.   Hypothyroidism    Mass of skin of right shoulder 04/2015   Neck mass 04/2015   posterior   Seasonal allergies    Vitamin D deficiency    Current Outpatient Medications  Medication Sig Dispense Refill   Ascorbic Acid (VITAMIN C PO) Take by mouth daily.     aspirin EC 81 MG tablet Take 81 mg by mouth daily.     atorvastatin (LIPITOR) 40 MG tablet Take  1 tablet  Daily  for Cholesterol 90 tablet 3   B Complex Vitamins (VITAMIN B COMPLEX PO) Take by mouth daily.     calcium carbonate (TUMS - DOSED IN MG ELEMENTAL CALCIUM) 500 MG chewable tablet Chew 1 tablet by mouth daily.     cetirizine (ZYRTEC) 10 MG tablet Take 10 mg by mouth daily. OTC     Cholecalciferol (VITAMIN D PO) Take 5,000 Int'l Units by mouth daily.      enalapril (VASOTEC) 10 MG tablet Take  1 tablet   Daily  for BP 90 tablet 3   famotidine (PEPCID) 40 MG tablet Take 1 tablet daily for Heart burn & Acid Indigestion 90 tablet 3   ipratropium (ATROVENT) 0.06 % nasal spray Place 2 sprays into the nose 3 (three) times daily. 45 mL 2   levothyroxine (SYNTHROID) 50 MCG tablet Take  1&1/2  tablets  Daily  on an empty stomach with only water for 30 minutes & no Antacid meds, Calcium or Magnesium for 4 hours & avoid Biotin 135 tablet 3   OVER THE COUNTER MEDICATION OTC Flonase PRN     amoxicillin (AMOXIL) 500 MG capsule Take 4 capsules 1 hour  before Dental Work for Bicuspid Aortic Valve                             /                      TAKE  BY                            MOUTH (Patient not taking: Reported on 02/05/2022) 60 capsule 1   montelukast (SINGULAIR) 10 MG tablet Take 1 tablet  Daily  for Allergies & Asthma 90 tablet 3   No current facility-administered medications for this visit.    Physical Exam BP (!) 140/86   Pulse 62   Resp 20   Ht '5\' 9"'$  (1.753 m)   Wt 168 lb (76.2 kg)   SpO2 97% Comment: RA  BMI 24.69 kg/m  61 year old man in no acute distress Alert and oriented x3 with no focal deficits No carotid bruits Cardiac regular rate and rhythm with a 2/6 systolic murmur Lungs clear with equal breath sounds bilaterally No peripheral edema  Diagnostic Tests: CT ANGIOGRAPHY CHEST WITH CONTRAST   TECHNIQUE: Multidetector CT imaging of the chest was performed using the standard protocol during bolus administration of intravenous contrast. Multiplanar CT image reconstructions and MIPs were obtained to evaluate the vascular anatomy.   RADIATION DOSE REDUCTION: This exam was performed according to the departmental dose-optimization program which includes automated exposure control, adjustment of the mA and/or kV according to patient size and/or use of iterative reconstruction technique.   CONTRAST:  24m ISOVUE-370 IOPAMIDOL (ISOVUE-370) INJECTION  76%   COMPARISON:  Multiple prior CTAs of the chest   FINDINGS: Cardiovascular: Preferential opacification of the thoracic aorta. Unchanged ascending aortic aneurysm measuring up to 5.1 cm, previously 5.1 cm, remeasured for consistency (series 4, image 52). Aortic arch is also unchanged you measuring up to 4.0 cm. Normal size main and branch pulmonary arteries. Normal cardiac size. No pericardial disease.   Mediastinum/Nodes: No lymphadenopathy.   Lungs/Pleura: No focal airspace consolidation. No pleural effusion. No pneumothorax. No suspicious pulmonary nodules.   Upper Abdomen: No acute findings. 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   Musculoskeletal: No acute osseous abnormality. No suspicious osseous lesion.   Review of the MIP images confirms the above findings.   IMPRESSION: Stable ascending aortic aneurysm measuring up to 5.1 cm. Recommend semi-annual imaging followup by CTA or MRA and referral to cardiothoracic surgery if not already obtained. This recommendation follows 2010 ACCF/AHA/AATS/ACR/ASA/SCA/SCAI/SIR/STS/SVM Guidelines for the Diagnosis and Management of Patients With Thoracic Aortic Disease. Circulation. 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     Electronically Signed   By: JMaurine SimmeringM.D.   On: 02/05/2022 13:33 I personally reviewed the CT images.  Stable 5.0- 5.1 cm aneurysm.  Focal dissection of the proximal SMA as noted above  Impression: [name] a 61year old man with a history significant for bicuspid aortic valve, moderate aortic stenosis, ascending aortic aneurysm, hypertension, hyperlipidemia, and hypothyroidism.  Ascending aneurysm-stable at about 5.1 cm.  Needs continued semiannual follow-up.  He is aware of the importance of blood pressure control.  Hypertension-blood pressure well controlled on  current regimen.  Borderline high today but he monitors himself at home.  Bicuspid aortic valve with moderate stenosis-last echo was about 18 months ago.  We will check with Dr. RHarrington Challengerand see when she wants to do another 1.  Plan: Return in 6 months with CT angio chest  SMelrose Nakayama MD [facility] ([phone]

## 2022-02-06 ENCOUNTER — Telehealth: Payer: Self-pay

## 2022-02-06 DIAGNOSIS — I7121 Aneurysm of the ascending aorta, without rupture: Secondary | ICD-10-CM

## 2022-02-06 DIAGNOSIS — I350 Nonrheumatic aortic (valve) stenosis: Secondary | ICD-10-CM

## 2022-02-06 NOTE — Telephone Encounter (Signed)
-----   Message from Fay Records, MD sent at 02/06/2022  4:40 PM EDT ----- Thanks [name] I will put him in for and echo in March/ April and see him then as well  Randy Allen  ----- Message ----- From: Melrose Nakayama, MD Sent: 02/05/2022   1:57 PM EDT To: Fay Records, MD  Randy Allen saw Randy Allen back in the office again today.  He is a 61 year old man with a bicuspid aortic valve and an ascending aneurysm.  His aneurysm is stable.  He is doing well.  His last echo about 18 months ago showed moderate aortic stenosis.  He said he did not an appointment scheduled with you.  Probably needs another echo in about 6 months.  I will know if you want to see him then or I can just order it.  I will see him back in about 6 months with a CT angiogram.  Thanks  [name]

## 2022-02-06 NOTE — Telephone Encounter (Signed)
Order paced for March/ April.   Appt to be made for Dr Harrington Challenger after the Echo.

## 2022-03-06 ENCOUNTER — Other Ambulatory Visit: Payer: Self-pay | Admitting: Internal Medicine

## 2022-03-06 DIAGNOSIS — E039 Hypothyroidism, unspecified: Secondary | ICD-10-CM

## 2022-03-06 MED ORDER — LEVOTHYROXINE SODIUM 75 MCG PO TABS: ORAL_TABLET | ORAL | 3 refills | Status: DC

## 2022-03-10 ENCOUNTER — Other Ambulatory Visit: Payer: Self-pay | Admitting: Internal Medicine

## 2022-05-17 ENCOUNTER — Ambulatory Visit: Payer: BC Managed Care – PPO | Admitting: Internal Medicine

## 2022-05-17 ENCOUNTER — Encounter: Payer: Self-pay | Admitting: Internal Medicine

## 2022-05-17 VITALS — BP 142/90 | HR 73 | Temp 97.9°F | Resp 16 | Ht 69.0 in | Wt 170.6 lb

## 2022-05-17 DIAGNOSIS — R519 Headache, unspecified: Secondary | ICD-10-CM | POA: Diagnosis not present

## 2022-05-17 DIAGNOSIS — I10 Essential (primary) hypertension: Secondary | ICD-10-CM | POA: Diagnosis not present

## 2022-05-17 DIAGNOSIS — Z79899 Other long term (current) drug therapy: Secondary | ICD-10-CM | POA: Diagnosis not present

## 2022-05-17 MED ORDER — DEXAMETHASONE 4 MG PO TABS: ORAL_TABLET | ORAL | 0 refills | Status: DC

## 2022-05-17 NOTE — Progress Notes (Signed)
Future Appointments  Date Time Provider Department  06/24/2022  4:00 PM Pricilla Riffle, MD [facility]  07/17/2022 10:00 AM Lucky Cowboy, MD [facility]    History of Present Illness:      The patient is S very nice 62 y.o. MWM  with HTN, HLD, Pre-Diabetes Vitamin D Deficiency, Thoracic Aortic Aneurysm  and Bicuspid Aortic Valve who presents with 24 hour hx/o crescendo  Left temporal HA   w/o any associated visual or neruro sx's.Investigating Dr Waverly Ferrari  on the Internet , he's concerned that he may have Temporal Arteritis.          Current Outpatient Medications on File Prior to Visit  Medication Sig   Ascorbic Acid (VITAMIN C PO) Take by mouth daily.   aspirin EC 81 MG tablet Take 81 mg by mouth daily.   atorvastatin (LIPITOR) 40 MG tablet Take  1 tablet  Daily  for Cholesterol   B Complex Vitamins (VITAMIN B COMPLEX PO) Take by mouth daily.   calcium carbonate (TUMS - DOSED IN MG ELEMENTAL CALCIUM) 500 MG chewable tablet Chew 1 tablet by mouth daily.   cetirizine (ZYRTEC) 10 MG tablet Take 10 mg by mouth daily. OTC   Cholecalciferol (VITAMIN D PO) Take 5,000 Int'l Units by mouth daily.    enalapril (VASOTEC) 10 MG tablet Take  1 tablet  Daily  for BP   famotidine (PEPCID) 40 MG tablet Take 1 tablet daily for Heart burn & Acid Indigestion   ipratropium (ATROVENT) 0.06 % nasal spray Place 2 sprays into the nose 3 (three) times daily.   levothyroxine (SYNTHROID) 75 MCG tablet Take  1 tablet  Daily  on an empty stomach with only water for 30 minutes & no Antacid meds, Calcium or Magnesium for 4 hours & avoid Biotin   OVER THE COUNTER MEDICATION OTC Flonase PRN   No current facility-administered medications on file prior to visit.    Allergies  Allergen Reactions   Propofol Shortness Of Breath    CHEST TIGHTNESS   Zoloft [Sertraline Hcl] Other (See Comments)    HYPERACTIVE   Quinolones     Aortic aneurysm     Problem list He has Bicuspid aortic valve with ascending aorta  4.0 to 4.5 cm in diameter; Irritable bowel syndrome; Hypothyroidism; Vitamin D deficiency; Medication management; Essential hypertension; Hyperlipidemia; Abnormal glucose; BMI 24.0-24.9, adult; Family history of ischemic heart disease; Ascending aortic aneurysm (HCC); Superior mesenteric artery aneurysm (HCC); and CKD (chronic kidney disease) stage 2, GFR 60-89 ml/min on their problem list.   Observations/Objective:  BP (!) 142/90   Pulse 73   Temp 97.9 F (36.6 C)   Resp 16   Ht 5\' 9"  (1.753 m)   Wt 170 lb 9.6 oz (77.4 kg)   SpO2 97%   BMI 25.19 kg/m   HEENT - EACs / TMs - Nl. EOMs Full & conjugate. PERRLA. N/O/P - cleat.  Neck - supple. Car 2+ /2+, N, JVD Chest - Clear equal BS. Cor - Nl HS. RRR w/ soft Gr 2-3 Ao Sys M. PP 2 +. No edema. MS- FROM w/o deformities.  Gait Nl. Neuro -  Nl w/o focal abnormalities.   Assessment and Plan:   1. Nonintractable headache, unspecified chronicity pattern, unspecified headache type  - dexamethasone (DECADRON) 4 MG tablet; Take 1 tab 3 x /day for 2 days,  then 2 x /day for 2  Days,  then 1 tab daily  Dispense: 13 tablet; Refill: 0   -  CBC with Differential/Platelet - Sedimentation rate  2. Essential hypertension  - CBC with Differential/Platelet  3. Medication management  - CBC with Differential/Platelet - Sedimentation rate - C-reactive protein   Follow Up Instructions:        I discussed the assessment and treatment plan with the patient. The patient was provided an opportunity to ask questions and all were answered. The patient agreed with the plan and demonstrated an understanding of the instructions.       The patient was advised to call back or seek an in-person evaluation if the symptoms worsen or if the condition fails to improve as anticipated.    Marinus Maw, MD

## 2022-05-18 LAB — CBC WITH DIFFERENTIAL/PLATELET
Absolute Monocytes: 540 cells/uL (ref 200–950)
Basophils Absolute: 52 cells/uL (ref 0–200)
Basophils Relative: 0.7 %
Eosinophils Absolute: 274 cells/uL (ref 15–500)
Eosinophils Relative: 3.7 %
HCT: 45.6 % (ref 38.5–50.0)
Hemoglobin: 16 g/dL (ref 13.2–17.1)
Lymphs Abs: 1717 cells/uL (ref 850–3900)
MCH: 31.6 pg (ref 27.0–33.0)
MCHC: 35.1 g/dL (ref 32.0–36.0)
MCV: 89.9 fL (ref 80.0–100.0)
MPV: 9.8 fL (ref 7.5–12.5)
Monocytes Relative: 7.3 %
Neutro Abs: 4817 cells/uL (ref 1500–7800)
Neutrophils Relative %: 65.1 %
Platelets: 219 10*3/uL (ref 140–400)
RBC: 5.07 10*6/uL (ref 4.20–5.80)
RDW: 12.9 % (ref 11.0–15.0)
Total Lymphocyte: 23.2 %
WBC: 7.4 10*3/uL (ref 3.8–10.8)

## 2022-05-18 LAB — C-REACTIVE PROTEIN: CRP: 0.8 mg/L (ref <8.0)

## 2022-05-18 LAB — SEDIMENTATION RATE: Sed Rate: 2 mm/h (ref 0–20)

## 2022-05-19 ENCOUNTER — Encounter: Payer: Self-pay | Admitting: Internal Medicine

## 2022-05-19 NOTE — Progress Notes (Signed)
<><><><><><><><><><><><><><><><><><><><><><><><><><><><><><><><><> <><><><><><><><><><><><><><><><><><><><><><><><><><><><><><><><><> -   Test results slightly outside the reference range are not unusual. If there is anything important, I will review this with you,  otherwise it is considered normal test values.  If you have further questions,  please do not hesitate to contact me at the office or via My Chart.  <><><><><><><><><><><><><><><><><><><><><><><><><><><><><><><><><> <><><><><><><><><><><><><><><><><><><><><><><><><><><><><><><><><>  -  Sed Rate & CRP are BOTH  Very LOW  & Normal - So                        NOT Temporal Arteritis    or    PMR / Polymyalgia Rheumatica - Great !  <><><><><><><><><><><><><><><><><><><><><><><><><><><><><><><><><> <><><><><><><><><><><><><><><><><><><><><><><><><><><><><><><><><>  -  CBC - Nl Red cell count & WBC -No Infection   <><><><><><><><><><><><><><><><><><><><><><><><><><><><><><><><><> <><><><><><><><><><><><><><><><><><><><><><><><><><><><><><><><><>  -  Continue Decadron taper til finish.  <><><><><><><><><><><><><><><><><><><><><><><><><><><><><><><><><> <><><><><><><><><><><><><><><><><><><><><><><><><><><><><><><><><>

## 2022-05-19 NOTE — Patient Instructions (Signed)
General Headache Without Cause A headache is pain or discomfort felt around the head or neck area. There are many causes and types of headaches. A few common types include: Tension headaches. Migraine headaches. Cluster headaches. Chronic daily headaches. Sometimes, the specific cause of a headache may not be found. Follow these instructions at home: Watch your condition for any changes. Let your health care provider know about them. Take these steps to help with your condition: Managing pain     Take over-the-counter and prescription medicines only as told by your health care provider. Treatment may include medicines for pain that are taken by mouth or applied to the skin. Lie down in a dark, quiet room when you have a headache. Keep lights dim if bright lights bother you or make your headaches worse. If directed, put ice on your head and neck area: Put ice in a plastic bag. Place a towel between your skin and the bag. Leave the ice on for 20 minutes, 2-3 times per day. Remove the ice if your skin turns bright red. This is very important. If you cannot feel pain, heat, or cold, you have a greater risk of damage to the area. If directed, apply heat to the affected area. Use the heat source that your health care provider recommends, such as a moist heat pack or a heating pad. Place a towel between your skin and the heat source. Leave the heat on for 20-30 minutes. Remove the heat if your skin turns bright red. This is especially important if you are unable to feel pain, heat, or cold. You have a greater risk of getting burned. Eating and drinking Eat meals on a regular schedule. If you drink alcohol: Limit how much you have to: 0-1 drink a day for women who are not pregnant. 0-2 drinks a day for men. Know how much alcohol is in a drink. In the [place] one drink equals one 12 oz bottle of beer (355 mL), one 5 oz glass of wine (148 mL), or one 1 oz glass of hard liquor (44 mL). Stop  drinking caffeine, or decrease the amount of caffeine you drink. Drink enough fluid to keep your urine pale yellow. General instructions  Keep a headache journal to help find out what may trigger your headaches. For example, write down: What you eat and drink. How much sleep you get. Any change to your diet or medicines. Try massage or other relaxation techniques. Limit stress. Sit up straight, and do not tense your muscles. Do not use any products that contain nicotine or tobacco. These products include cigarettes, chewing tobacco, and vaping devices, such as e-cigarettes. If you need help quitting, ask your health care provider. Exercise regularly as told by your health care provider. Sleep on a regular schedule. Get 7-9 hours of sleep each night, or the amount recommended by your health care provider. Keep all follow-up visits. This is important. Contact a health care provider if: Medicine does not help your symptoms. You have a headache that is different from your usual headache. You have nausea or you vomit. You have a fever. Get help right away if: Your headache: Becomes severe quickly. Gets worse after moderate to intense physical activity. You have any of these symptoms: Repeated vomiting. Pain or stiffness in your neck. Changes to your vision. Pain in an eye or ear. Problems with speech. Muscular weakness or loss of muscle control. Loss of balance or coordination. You feel faint or pass out. You have confusion. You have   a seizure. These symptoms may represent a serious problem that is an emergency. Do not wait to see if the symptoms will go away. Get medical help right away. Call your local emergency services (911 in the [place] Do not drive yourself to the hospital. Summary A headache is pain or discomfort felt around the head or neck area. There are many causes and types of headaches. In some cases, the cause may not be found. Keep a headache journal to help find out  what may trigger your headaches. Watch your condition for any changes. Let your health care provider know about them. Contact a health care provider if you have a headache that is different from the usual headache, or if your symptoms are not helped by medicine. Get help right away if your headache becomes severe, you vomit, you have a loss of vision, you lose your balance, or you have a seizure. This information is not intended to replace advice given to you by your health care provider. Make sure you discuss any questions you have with your health care provider. Document Revised: 08/30/2020 Document Reviewed: 08/30/2020 Elsevier Patient Education  2023 Elsevier Inc.  

## 2022-05-20 ENCOUNTER — Ambulatory Visit: Payer: BC Managed Care – PPO | Admitting: Internal Medicine

## 2022-06-13 IMAGING — CT CT ANGIO CHEST
2 of 7 series · 17 of 36 positions shown · IV contrast (agent unspecified)
Comparison: Comparison made with January 30, 2021.

CLINICAL DATA: Aortic aneurysm follow-up.

EXAM:
CT ANGIOGRAPHY CHEST WITH CONTRAST
TECHNIQUE: Multidetector CT imaging of the chest was performed using the
standard protocol during bolus administration of intravenous
contrast. Multiplanar CT image reconstructions and MIPs were
obtained to evaluate the vascular anatomy.

[Series 9: cta thorax 1.00 bv36 s3 arterial thins · axial · arterial · 0.80mm/px · z∈[+1407,+1746]mm · 16 of 641 slices shown]
[im 38/641  lung]
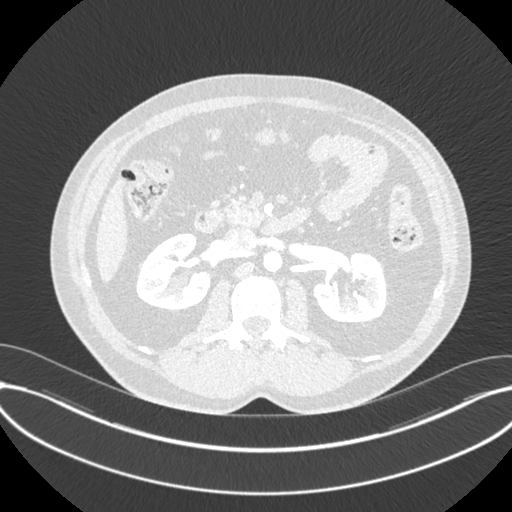
[im 76/641  mediastinal]
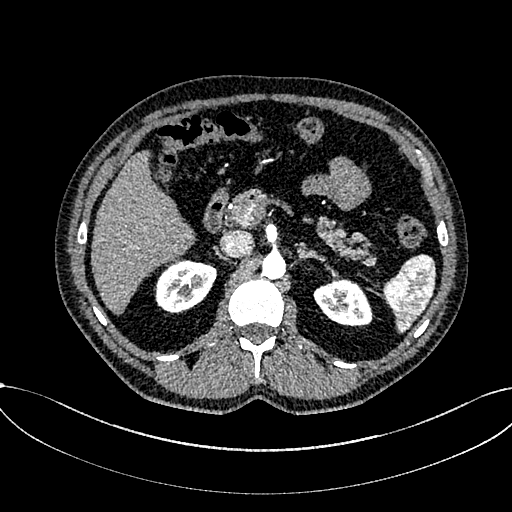
[im 113/641  lung]
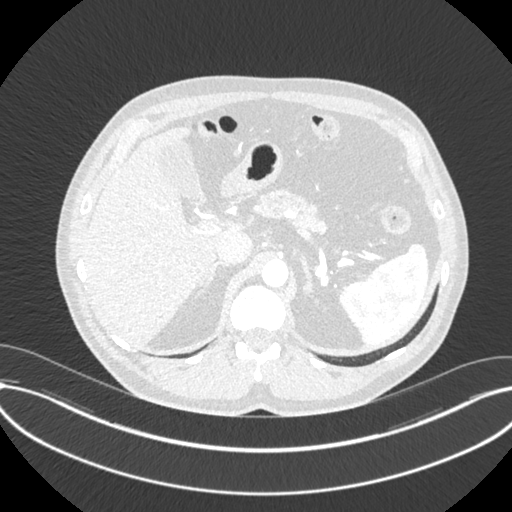
[im 151/641  mediastinal]
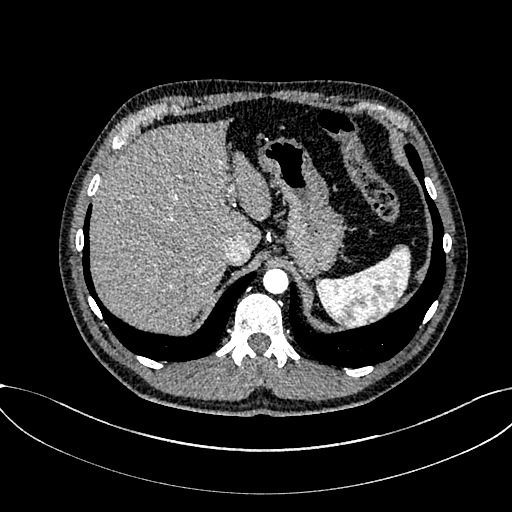
[im 189/641  lung]
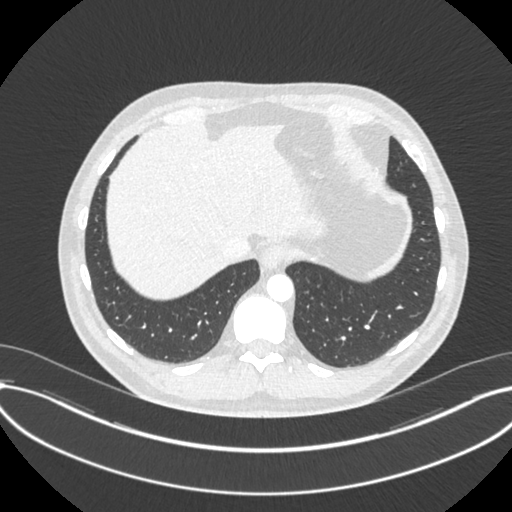
[im 226/641  mediastinal]
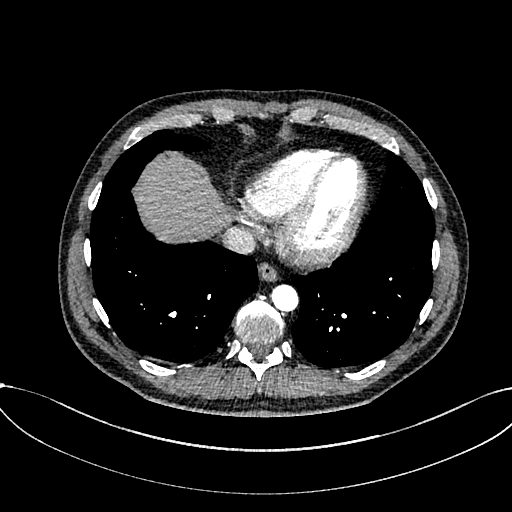
[im 264/641  lung]
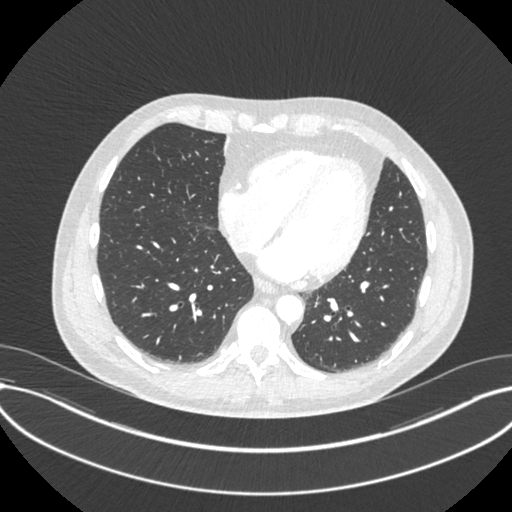
[im 302/641  mediastinal]
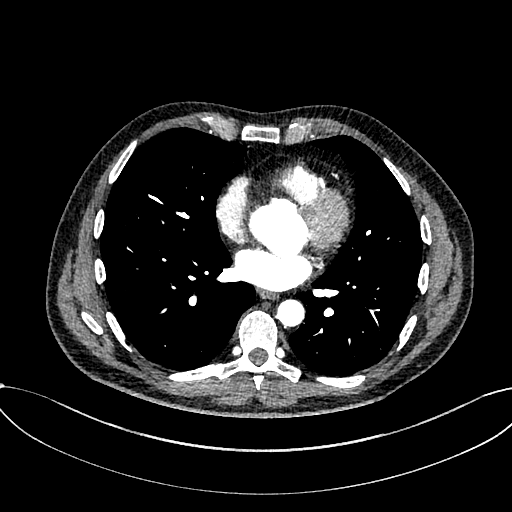
[im 339/641  lung]
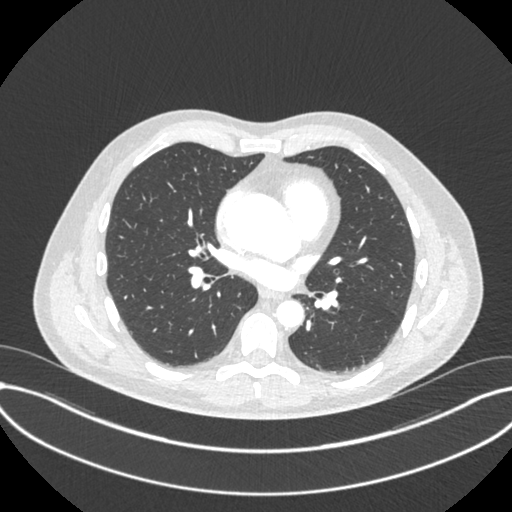
[im 377/641  mediastinal]
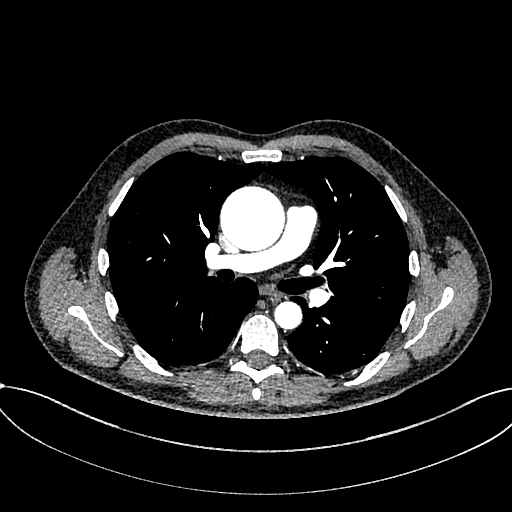
[im 415/641  lung]
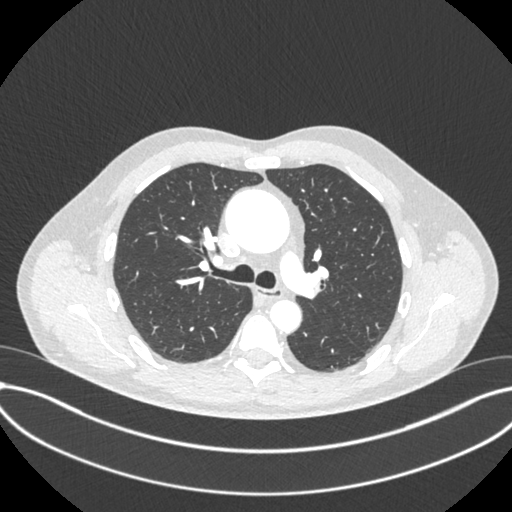
[im 452/641  mediastinal]
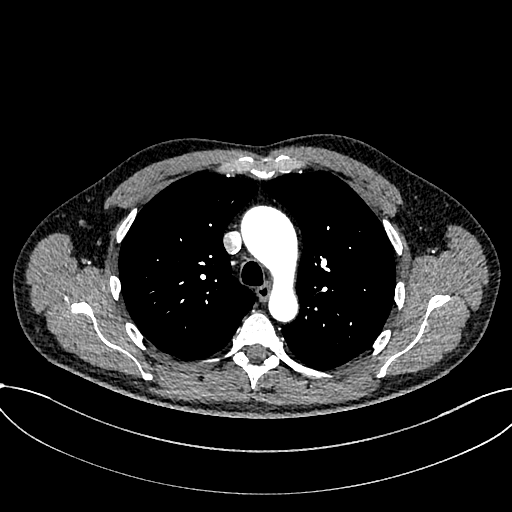
[im 490/641  lung]
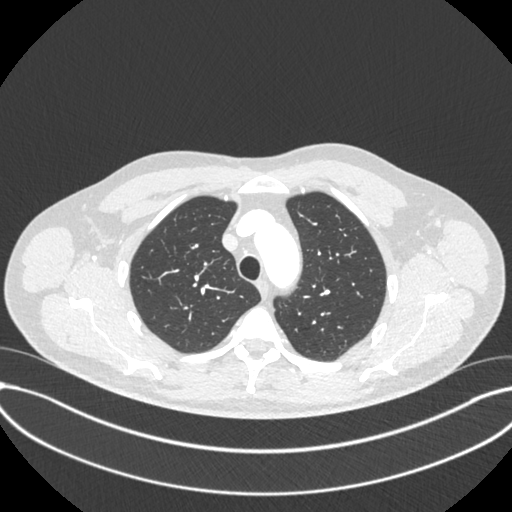
[im 528/641  mediastinal]
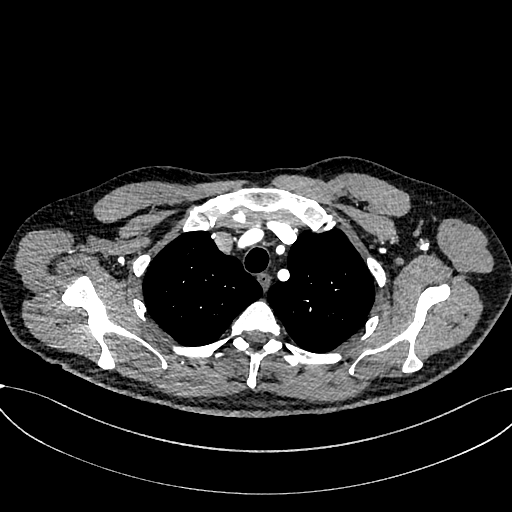
[im 565/641  lung]
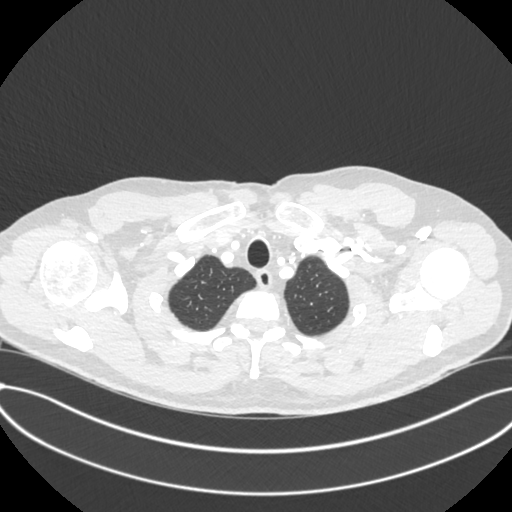
[im 603/641  mediastinal]
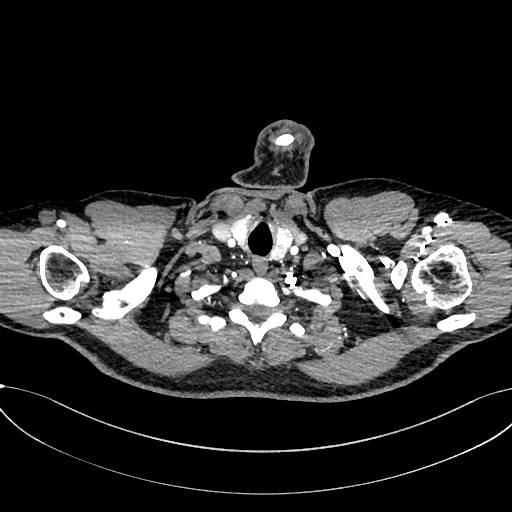

[Series 10: cta thorax 2.00 bv36 s3 cor st · coronal · 0.75mm/px · 1 of 147 slices shown]
[im 74/147  mediastinal]
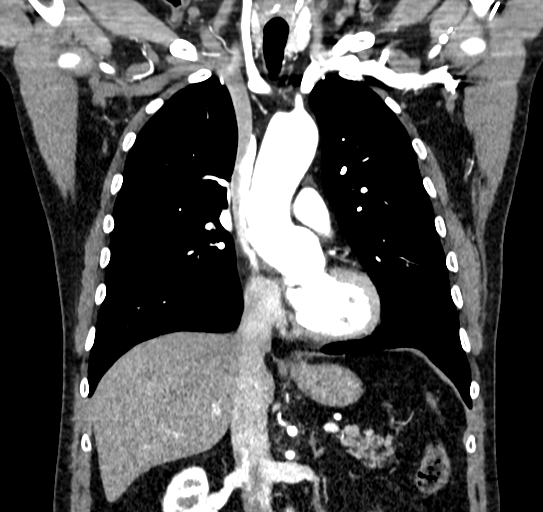

[17 of 36 positions shown; findings below may reference images not displayed]

RADIATION DOSE REDUCTION: This exam was performed according to the
departmental dose-optimization program which includes automated
exposure control, adjustment of the mA and/or kV according to
patient size and/or use of iterative reconstruction technique.

CONTRAST:  75mL ODFOCX-GZN IOPAMIDOL (ODFOCX-GZN) INJECTION 76%
FINDINGS: Cardiovascular: Ascending thoracic aorta at 5 cm. Stable in coronal
and axial plane compared to previous imaging. Aortic root at 4 cm.
Sinuses of Valsalva at approximately 4 cm. 3 cm at the level of the
aortic valve. Standard three-vessel branching pattern. Descending
thoracic aorta with normal caliber. Calcifications of the aortic
valve.

Heart size is normal without pericardial effusion.

Mediastinum/Nodes: No thoracic inlet, axillary, mediastinal or hilar
adenopathy. Esophagus grossly normal.

Lungs/Pleura: Lungs are clear.  Airways are patent.  No effusion.

Upper Abdomen: Incidental imaging of upper abdominal contents shows
no acute process. Imaged portions the liver, gallbladder, pancreas,
spleen, adrenal glands and kidneys are unremarkable.
Gastrointestinal tract without acute findings to the extent
evaluated.w

Stable appearance of focal dissection of the SMA associated small
pseudoaneurysm. Maximal caliber as measured in the coronal plane

Musculoskeletal: No chest wall abnormality. No acute or significant
osseous findings.

Review of the MIP images confirms the above findings.
IMPRESSION: 1. Stable ascending thoracic aorta at 5 cm. No acute process.
Ascending thoracic aortic aneurysm. Recommend semi-annual imaging
followup by CTA or MRA and referral to cardiothoracic surgery if not
already obtained. This recommendation follows 9373
ACCF/AHA/AATS/ACR/ASA/SCA/TAMICA/KAKI/KESEPIS/CATHY Guidelines for the
Diagnosis and Management of Patients With Thoracic Aortic Disease.
Circulation. 9373; 121: [id number]. Aortic aneurysm NOS (HAV8S-Y6T.T)
2. Calcifications of the aortic valve.
3. Stable focal dissection with either dissection within small
aneurysm or dissection with small associated pseudoaneurysm is
unchanged over a series of prior studies involving the SMA.

Aortic Atherosclerosis (HAV8S-2SS.S).

## 2022-06-21 ENCOUNTER — Ambulatory Visit (HOSPITAL_COMMUNITY): Payer: BC Managed Care – PPO | Attending: Internal Medicine

## 2022-06-21 DIAGNOSIS — I350 Nonrheumatic aortic (valve) stenosis: Secondary | ICD-10-CM | POA: Diagnosis not present

## 2022-06-21 LAB — ECHOCARDIOGRAM COMPLETE
AR max vel: 1.33 cm2
AV Area VTI: 1.29 cm2
AV Area mean vel: 1.19 cm2
AV Mean grad: 21.2 mmHg
AV Peak grad: 34.8 mmHg
Ao pk vel: 2.95 m/s
Area-P 1/2: 3.81 cm2
S' Lateral: 3.1 cm

## 2022-06-23 NOTE — Progress Notes (Unsigned)
Cardiology Office Note   Date:  06/24/2022   ID:  Randy Allen, DOB Jun 14, 1960, MRN [id number]  PCP:  Unk Pinto, MD  Cardiologist:   Dorris Carnes, MD   Pt presents for follow up of aortic stenosis      History of Present Illness: Randy Allen is a 62 y.o. male with a history of HTN, HL, GERD, hypothyroidism bicuspid AV, asc aortic aneurysm   I saw the pt in 2021  He continues to follow with Randy Allen for asc aortic aneurysm  Last seen in the fall 2023   He denies CP  Breathing is OK  Does get a little short with hills   No palpitations   No dizziness      Current Meds  Medication Sig   aspirin EC 81 MG tablet Take 81 mg by mouth daily.   atorvastatin (LIPITOR) 40 MG tablet Take  1 tablet  Daily  for Cholesterol   calcium carbonate (TUMS - DOSED IN MG ELEMENTAL CALCIUM) 500 MG chewable tablet Chew 1 tablet by mouth daily.   cetirizine (ZYRTEC) 10 MG tablet Take 10 mg by mouth daily. OTC   enalapril (VASOTEC) 10 MG tablet Take  1 tablet  Daily  for BP   famotidine (PEPCID) 40 MG tablet Take 1 tablet daily for Heart burn & Acid Indigestion   levothyroxine (SYNTHROID) 75 MCG tablet Take  1 tablet  Daily  on an empty stomach with only water for 30 minutes & no Antacid meds, Calcium or Magnesium for 4 hours & avoid Biotin   Multiple Vitamin (MULTIVITAMIN) tablet Take 1 tablet by mouth daily.   OVER THE COUNTER MEDICATION OTC Flonase PRN     Allergies:   Propofol, Zoloft [sertraline hcl], and Quinolones   Past Medical History:  Diagnosis Date   Dental crowns present    Heart murmur    states need for antibiotics prior to dental procedures; no cardiologist   History of hepatitis A    age 63   History of MRSA infection 2015   right great toe   Hyperlipidemia    Hypertension    states under control with med., has been on med. > 10 yr.   Hypothyroidism    Mass of skin of right shoulder 04/2015   Neck mass 04/2015   posterior   Seasonal allergies    Vitamin D  deficiency     Past Surgical History:  Procedure Laterality Date   COLONOSCOPY WITH PROPOFOL  10/21/2013   EYE SURGERY Right 1966   HYDROCELE EXCISION     MASS EXCISION Right 04/26/2015   Procedure: EXCISION MASS NECK AND RIGHT SHOULDER ;  Surgeon: Johnathan Hausen, MD;  Location: [facility];  Service: General;  Laterality: Right;     Social History:  The patient  reports that he has never smoked. He has never used smokeless tobacco. He reports current alcohol use. He reports that he does not use drugs.   Family History:  The patient's family history includes Arthritis in his father and mother; Cancer in his father; Colon cancer (age of onset: 60) in his maternal uncle; Depression in his mother; Heart attack in his father; Migraines in his mother; Ulcers in his father.    ROS:  Please see the history of present illness. All other systems are reviewed and  Negative to the above problem except as noted.    PHYSICAL EXAM: VS:  BP 116/84   Pulse 68   Ht 5'  9" (1.753 m)   Wt 168 lb 9.6 oz (76.5 kg)   SpO2 95%   BMI 24.90 kg/m   GEN: Well nourished, well developed, in no acute distress  HEENT: normal  Neck: no JVD,  Cardiac: RRR; Gr II-III/VI systolic murmur LSB to base   No LE  edema  Respiratory:  clear to auscultation bilaterally,  GI: soft, nontender  No hepatomegaly  MS: no deformity Moving all extremities   Skin: warm and dry, no rash Neuro:  Strength and sensation are intact Psych: euthymic mood, full affect   EKG:  EKG is not ordered today.  Echo  March 2024  1. Left ventricular ejection fraction, by estimation, is 60 to 65%. The  left ventricle has normal function. The left ventricle has no regional  wall motion abnormalities. There is moderate asymmetric left ventricular  hypertrophy of the basal-septal  segment. Left ventricular diastolic parameters were normal.   2. Right ventricular systolic function is normal. The right ventricular  size is normal.  Tricuspid regurgitation signal is inadequate for assessing  PA pressure.   3. The mitral valve is normal in structure. Trivial mitral valve  regurgitation. No evidence of mitral stenosis.   4. The aortic valve is bicuspid. Fusion of right and noncoronary cusps.  Aortic valve regurgitation is mild. Moderate aortic valve stenosis. Vmax  3.1 m/s, MG 22 mmHg, AVA 1,2 cm^2, DI 0.32   5. The inferior vena cava is normal in size with greater than 50%  respiratory variability, suggesting right atrial pressure of 3 mmHg.   6. Aortic dilatation noted. Aneurysm of the ascending aorta, measuring 52  mm. There is dilatation of the aortic root, measuring 43 mm.   Lipid Panel    Component Value Date/Time   CHOL 103 01/10/2022 1620   TRIG 140 01/10/2022 1620   HDL 43 01/10/2022 1620   CHOLHDL 2.4 01/10/2022 1620   VLDL 24 04/18/2016 1109   LDLCALC 38 01/10/2022 1620      Wt Readings from Last 3 Encounters:  06/24/22 168 lb 9.6 oz (76.5 kg)  05/17/22 170 lb 9.6 oz (77.4 kg)  02/05/22 168 lb (76.2 kg)      ASSESSMENT AND PLAN:  1  AV disease  echo last week   AV bicuspid   AS  remains moderate with mean gradient of 22 mm Hg   Follow with periodic echoes    2  Aortic aneurysym  Follows  with [facility]   Last CT in October aorta is 5.1 cm    3  HTN  Diastolic is a little high   Follow for now    Goal:   Systolic less that [id number]  Diastolic les than 90  4  HL  Excellent control  LDL 53   Follow    F/U tentatively in Jan 2025     Current medicines are reviewed at length with the patient today.  The patient does not have concerns regarding medicines.  Signed, Dorris Carnes, MD  06/24/2022 4:27 PM    [address], [place], [place]  [address] Phone: [phone]; Fax: [phone]

## 2022-06-24 ENCOUNTER — Encounter: Payer: Self-pay | Admitting: Internal Medicine

## 2022-06-24 ENCOUNTER — Ambulatory Visit: Payer: BC Managed Care – PPO | Attending: Internal Medicine | Admitting: Internal Medicine

## 2022-06-24 ENCOUNTER — Other Ambulatory Visit: Payer: Self-pay | Admitting: Thoracic Surgery (Cardiothoracic Vascular Surgery)

## 2022-06-24 VITALS — BP 116/84 | HR 68 | Ht 69.0 in | Wt 168.6 lb

## 2022-06-24 DIAGNOSIS — I7121 Aneurysm of the ascending aorta, without rupture: Secondary | ICD-10-CM

## 2022-06-24 NOTE — Patient Instructions (Signed)
Medication Instructions:   *If you need a refill on your cardiac medications before your next appointment, please call your pharmacy*   Lab Work:  If you have labs (blood work) drawn today and your tests are completely normal, you will receive your results only by: [place] (if you have MyChart) OR A paper copy in the mail If you have any lab test that is abnormal or we need to change your treatment, we will call you to review the results.   Testing/Procedures:    Follow-Up: At [facility], you and your health needs are our priority.  As part of our continuing mission to provide you with exceptional heart care, we have created designated Provider Care Teams.  These Care Teams include your primary Cardiologist (physician) and Advanced Practice Providers (APPs -  Physician Assistants and Nurse Practitioners) who all work together to provide you with the care you need, when you need it.  We recommend signing up for the patient portal called "MyChart".  Sign up information is provided on this After Visit Summary.  MyChart is used to connect with patients for Virtual Visits (Telemedicine).  Patients are able to view lab/test results, encounter notes, upcoming appointments, etc.  Non-urgent messages can be sent to your provider as well.   To learn more about what you can do with MyChart, go to [email or web address].    Your next appointment:   10 month(s)  Provider:   Dr Dorris Carnes     Other Instructions

## 2022-07-17 ENCOUNTER — Ambulatory Visit (INDEPENDENT_AMBULATORY_CARE_PROVIDER_SITE_OTHER): Payer: BC Managed Care – PPO | Admitting: Internal Medicine

## 2022-07-17 ENCOUNTER — Encounter: Payer: Self-pay | Admitting: Internal Medicine

## 2022-07-17 VITALS — BP 130/70 | HR 72 | Temp 97.9°F | Resp 17 | Ht 69.0 in | Wt 169.4 lb

## 2022-07-17 DIAGNOSIS — Z130 Encounter for screening for diseases of the blood and blood-forming organs and certain disorders involving the immune mechanism: Secondary | ICD-10-CM | POA: Diagnosis not present

## 2022-07-17 DIAGNOSIS — Z111 Encounter for screening for respiratory tuberculosis: Secondary | ICD-10-CM

## 2022-07-17 DIAGNOSIS — Z0001 Encounter for general adult medical examination with abnormal findings: Secondary | ICD-10-CM

## 2022-07-17 DIAGNOSIS — I7121 Aneurysm of the ascending aorta, without rupture: Secondary | ICD-10-CM

## 2022-07-17 DIAGNOSIS — E039 Hypothyroidism, unspecified: Secondary | ICD-10-CM

## 2022-07-17 DIAGNOSIS — R5383 Other fatigue: Secondary | ICD-10-CM

## 2022-07-17 DIAGNOSIS — Z1329 Encounter for screening for other suspected endocrine disorder: Secondary | ICD-10-CM

## 2022-07-17 DIAGNOSIS — R350 Frequency of micturition: Secondary | ICD-10-CM | POA: Diagnosis not present

## 2022-07-17 DIAGNOSIS — Z79899 Other long term (current) drug therapy: Secondary | ICD-10-CM

## 2022-07-17 DIAGNOSIS — Z125 Encounter for screening for malignant neoplasm of prostate: Secondary | ICD-10-CM

## 2022-07-17 DIAGNOSIS — Z0000 Encounter for general adult medical examination without abnormal findings: Secondary | ICD-10-CM | POA: Diagnosis not present

## 2022-07-17 DIAGNOSIS — E559 Vitamin D deficiency, unspecified: Secondary | ICD-10-CM

## 2022-07-17 DIAGNOSIS — Z1389 Encounter for screening for other disorder: Secondary | ICD-10-CM

## 2022-07-17 DIAGNOSIS — Z13220 Encounter for screening for lipoid disorders: Secondary | ICD-10-CM | POA: Diagnosis not present

## 2022-07-17 DIAGNOSIS — Z136 Encounter for screening for cardiovascular disorders: Secondary | ICD-10-CM

## 2022-07-17 DIAGNOSIS — N401 Enlarged prostate with lower urinary tract symptoms: Secondary | ICD-10-CM | POA: Diagnosis not present

## 2022-07-17 DIAGNOSIS — Q231 Congenital insufficiency of aortic valve: Secondary | ICD-10-CM

## 2022-07-17 DIAGNOSIS — Z131 Encounter for screening for diabetes mellitus: Secondary | ICD-10-CM

## 2022-07-17 DIAGNOSIS — E782 Mixed hyperlipidemia: Secondary | ICD-10-CM

## 2022-07-17 DIAGNOSIS — R7309 Other abnormal glucose: Secondary | ICD-10-CM

## 2022-07-17 DIAGNOSIS — Z8249 Family history of ischemic heart disease and other diseases of the circulatory system: Secondary | ICD-10-CM

## 2022-07-17 DIAGNOSIS — N182 Chronic kidney disease, stage 2 (mild): Secondary | ICD-10-CM

## 2022-07-17 DIAGNOSIS — I10 Essential (primary) hypertension: Secondary | ICD-10-CM

## 2022-07-17 DIAGNOSIS — Z1211 Encounter for screening for malignant neoplasm of colon: Secondary | ICD-10-CM

## 2022-07-17 NOTE — Progress Notes (Signed)
Annual  Screening/Preventative Visit  & Comprehensive Evaluation & Examination  Future Appointments  Date Time Provider Department  07/17/2022 10:00 AM Unk Pinto, MD [facility]  08/13/2022 11:30 AM Melrose Nakayama, MD [facility]  07/22/2023 11:00 AM Unk Pinto, MD [facility]            This very nice 62 y.o. MWM presents for a Screening /Preventative Visit & comprehensive evaluation and management of multiple medical co-morbidities.  Patient has been followed for HTN, HLD, Prediabetes, Hypothyroidism and Vitamin D Deficiency. Patient is followed by Dr Merilynn Finland for a Thoracic Aortic Aneurysm & Dr Dorris Carnes for a Bicuspid Aortic Valve.  Patient also has hx/o GERD controlled on OTC pepcid.        HTN predates circa  1999. Patient's BP has been controlled at home. Patient has CKD2 (GFR 74).  Today's BP is at goal -  130/70. Patient denies any cardiac symptoms as chest pain, palpitations, shortness of breath, dizziness or ankle swelling.       Patient's hyperlipidemia is controlled with diet and Atorvastatin. Patient denies myalgias or other medication SE's. Last lipids were at goal :  Lab Results  Component Value Date   CHOL 103 01/10/2022   HDL 43 01/10/2022   LDLCALC 38 01/10/2022   TRIG 140 01/10/2022   CHOLHDL 2.4 01/10/2022           Patient has been monitored expectantly for glucose intolerance and patient denies reactive hypoglycemic symptoms, visual blurring, diabetic polys or paresthesias. Last A1c was at goal :   Lab Results  Component Value Date   HGBA1C 5.2 01/10/2022         Patient was diagnosed  hypothyroid in 2011 & initiated on thyroid replacement therapy.        Finally, patient has history of Vitamin D Deficiency ("12" /2008) and last vitamin D was at goal :   Lab Results  Component Value Date   VD25OH 82 01/10/2022       Current Outpatient Medications  Medication Instructions   aspirin EC   81 mg Daily   atorvastatin 40  MG tablet Take  1 tablet  Daily  for Cholesterol   TUMS - 500 MG chewable tab 1 tablet, Oral, Daily   cetirizine  10 mg  Daily, OTC    Enalapril  10 MG tablet Take  1 tablet  Daily  for BP   famotidine  40 MG tablet Take 1 tablet daily    levothyroxine  75 MCG tablet Take  1 tablet  Daily     Multiple Vitamin  1 tablet, Oral, Daily   OTC Flonase  PRN      Allergies  Allergen Reactions   Propofol Shortness Of Breath    CHEST TIGHTNESS   Zoloft [Sertraline Hcl] Other (See Comments)    HYPERACTIVE     Past Medical History:  Diagnosis Date   Dental crowns present    Heart murmur    states need for antibiotics prior to dental procedures; no cardiologist   History of hepatitis A    age 33   History of MRSA infection 2015   right great toe   Hyperlipidemia    Hypertension    states under control with med., has been on med. > 10 yr.   Hypothyroidism    Mass of skin of right shoulder 04/2015   Neck mass 04/2015   posterior   Seasonal allergies    Vitamin D deficiency  Health Maintenance  Topic Date Due   Zoster Vaccines- Shingrix (1 of 2) Never done   COVID-19 Vaccine (4 - Booster for [employer] series) 05/27/2020   INFLUENZA VACCINE  Never done   TETANUS/TDAP  10/28/2023   Hepatitis C Screening  Completed   HIV Screening  Completed   HPV VACCINES  Aged Out     Immunization History  Administered Date(s) Administered   PFIZER SARS-COV-2 Vacc  07/08/2019, 07/31/2019, 04/01/2020   PPD Test 06/04/2018, 06/24/2019, 07/04/2020   Pneumococcal-23 12/16/2008   Td 04/15/2005   Tdap 10/27/2013    Last Colon - 07.09.2015 - Dr Deatra Ina - Recc 10 year f/u due July 2025   Past Surgical History:  Procedure Laterality Date   COLONOSCOPY WITH PROPOFOL  10/21/2013   EYE SURGERY Right 1966   HYDROCELE EXCISION     MASS EXCISION Right 04/26/2015   Procedure: EXCISION MASS NECK AND RIGHT SHOULDER ;  Surgeon: Johnathan Hausen, MD;  Location: [facility];  Service: General;   Laterality: Right;     Family History  Problem Relation Age of Onset   Arthritis Mother    Depression Mother    Migraines Mother    Cancer Father        prostate   Arthritis Father    Ulcers Father    Heart attack Father    Colon cancer Maternal Uncle 62     Social History   Socioeconomic History   Marital status: Married      Spouse name: Geni Bers       Number of children: None  Occupational History   Not on file   Tobacco Use   Smoking status: Never   Smokeless tobacco: Never  Substance Use Topics   Alcohol use: Yes    Comment: socially   Drug use: No      ROS Constitutional: Denies fever, chills, weight loss/gain, headaches, insomnia,  night sweats or change in appetite. Does c/o fatigue. Eyes: Denies redness, blurred vision, diplopia, discharge, itchy or watery eyes.  ENT: Denies discharge, congestion, post nasal drip, epistaxis, sore throat, earache, hearing loss, dental pain, Tinnitus, Vertigo, Sinus pain or snoring.  Cardio: Denies chest pain, palpitations, irregular heartbeat, syncope, dyspnea, diaphoresis, orthopnea, PND, claudication or edema Respiratory: denies cough, dyspnea, DOE, pleurisy, hoarseness, laryngitis or wheezing.  Gastrointestinal: Denies dysphagia, heartburn, reflux, water brash, pain, cramps, nausea, vomiting, bloating, diarrhea, constipation, hematemesis, melena, hematochezia, jaundice or hemorrhoids Genitourinary: Denies dysuria, frequency, urgency, nocturia, hesitancy, discharge, hematuria or flank pain Musculoskeletal: Denies arthralgia, myalgia, stiffness, Jt. Swelling, pain, limp or strain/sprain. Denies Falls. Skin: Denies puritis, rash, hives, warts, acne, eczema or change in skin lesion Neuro: No weakness, tremor, incoordination, spasms, paresthesia or pain Psychiatric: Denies confusion, memory loss or sensory loss. Denies Depression. Endocrine: Denies change in weight, skin, hair change, nocturia, and paresthesia, diabetic  polys, visual blurring or hyper / hypo glycemic episodes.  Heme/Lymph: No excessive bleeding, bruising or enlarged lymph nodes.   Physical Exam  BP 130/70   Pulse 72   Temp 97.9 F (36.6 C)   Resp 17   Ht 5\' 9"  (1.753 m)   Wt 169 lb 6.4 oz (76.8 kg)   SpO2 97%   BMI 25.02 kg/m   General Appearance: Well nourished and well groomed and in no apparent distress.  Eyes: PERRLA, EOMs, conjunctiva no swelling or erythema, normal fundi and vessels. Sinuses: No frontal/maxillary tenderness ENT/Mouth: EACs patent / TMs  nl. Nares clear without erythema, swelling, mucoid exudates. Oral hygiene  is good. No erythema, swelling, or exudate. Tongue normal, non-obstructing. Tonsils not swollen or erythematous. Hearing normal.  Neck: Supple, thyroid not palpable. No bruits, nodes or JVD. Respiratory: Respiratory effort normal.  BS equal and clear bilateral without rales, rhonci, wheezing or stridor. Cardio: Heart sounds are normal with regular rate with a  Gr 2-3/4 sys murmur at the LSB also radiating to the Rt neck. Peripheral pulses are normal and equal bilaterally without edema. No aortic or femoral bruits. Chest: symmetric with normal excursions and percussion.  Abdomen: Soft, with Nl bowel sounds. Nontender, no guarding, rebound, hernias, masses, or organomegaly.  Lymphatics: Non tender without lymphadenopathy.  Musculoskeletal: Full ROM all peripheral extremities, joint stability, 5/5 strength, and normal gait. Skin: Warm and dry without rashes, lesions, cyanosis, clubbing or  ecchymosis.  Neuro: Cranial nerves intact, reflexes equal bilaterally. Normal muscle tone, no cerebellar symptoms. Sensation intact.  Pysch: Alert and oriented x 3 with normal affect, insight and judgment appropriate.   Assessment and Plan  1. Annual Preventative/Screening Exam    2. Essential hypertension  - EKG 12-Lead - Urinalysis, Routine w reflex microscopic - Microalbumin / creatinine urine ratio - CBC with  Differential/Platelet - COMPLETE METABOLIC PANEL WITH GFR - Magnesium - TSH  3. Hyperlipidemia, mixed  - EKG 12-Lead - Lipid panel - TSH  4. Abnormal glucose  - EKG 12-Lead - Hemoglobin A1c - Insulin, random  5. Vitamin D deficiency  - VITAMIN D 25 Hydroxy   6. Hypothyroidism  - TSH  7. Bicuspid aortic valve  - EKG 12-Lead  8. Aneurysm of ascending aorta without rupture  - EKG 12-Lead  9. CKD (chronic kidney disease) stage 2, GFR 60-89 ml/min  - Urinalysis, Routine w reflex microscopic - Microalbumin / creatinine urine ratio - COMPLETE METABOLIC PANEL WITH GFR  10. Screening for colorectal cancer  - POC Hemoccult Bld/Stl  11. Prostate cancer screening  - PSA  12. Screening-pulmonary TB  - TB Skin Test  13. Screening for ischemic heart disease  - EKG 12-Lead  14. FHx: heart disease  - EKG 12-Lead  15. Screening for AAA (aortic abdominal aneurysm)   16. Fatigue, unspecified type  - Iron, Total/Total Iron Binding Cap - Vitamin B12 - Testosterone - CBC with Differential/Platelet - TSH  17. Medication management  - Urinalysis, Routine w reflex microscopic - Microalbumin / creatinine urine ratio - Testosterone - TB Skin Test - CBC with Differential/Platelet - COMPLETE METABOLIC PANEL WITH GFR - Magnesium - Lipid panel - TSH - Hemoglobin A1c        Patient was counseled in prudent diet, weight control to achieve/maintain BMI less than 25, BP monitoring, regular exercise and medications as discussed.  Discussed med effects and SE's. Routine screening labs and tests as requested with regular follow-up as recommended. Over 40 minutes of exam, counseling, chart review and high complex critical decision making was performed   Kirtland Bouchard, MD

## 2022-07-17 NOTE — Patient Instructions (Signed)
Due to recent changes in healthcare laws, you may see the results of your imaging and laboratory studies on MyChart before your provider has had a chance to review them.  We understand that in some cases there may be results that are confusing or concerning to you. Not all laboratory results come back in the same time frame and the provider may be waiting for multiple results in order to interpret others.  Please give us 48 hours in order for your provider to thoroughly review all the results before contacting the office for clarification of your results.   ++++++++++++++++++++++++++++++++++++++  Vit D  & Vit C 1,000 mg   are recommended to help protect  against the Covid-19 and other Corona viruses.    Also it's recommended  to take  Zinc 50 mg  to help  protect against the Covid-19   and Tregre place to get  is also on [employer]  and don't pay more than 6-8 cents /pill !  =============================== Coronavirus (COVID-19) Are you at risk?  Are you at risk for the Coronavirus (COVID-19)?  To be considered HIGH RISK for Coronavirus (COVID-19), you have to meet the following criteria:  Traveled to [place], [place], [place], [place] or [place]; or in the [place] to [place], [place], [place]  or [place]; and have fever, cough, and shortness of breath within the last 2 weeks of travel OR Been in close contact with a person diagnosed with COVID-19 within the last 2 weeks and have  fever, cough,and shortness of breath  IF YOU DO NOT MEET THESE CRITERIA, YOU ARE CONSIDERED LOW RISK FOR COVID-19.  What to do if you are HIGH RISK for COVID-19?  If you are having a medical emergency, call 911. Seek medical care right away. Before you go to a doctor's office, urgent care or emergency department,  call ahead and tell them about your recent travel, contact with someone diagnosed with COVID-19   and your symptoms.  You should receive instructions from your physician's office  regarding next steps of care.  When you arrive at healthcare provider, tell the healthcare staff immediately you have returned from  visiting [place], [place], [place], [place] or [place]; or traveled in the [place] to [place], [place],  [place] or [place] in the last two weeks or you have been in close contact with a person diagnosed with  COVID-19 in the last 2 weeks.   Tell the health care staff about your symptoms: fever, cough and shortness of breath. After you have been seen by a medical provider, you will be either: Tested for (COVID-19) and discharged home on quarantine except to seek medical care if  symptoms worsen, and asked to  Stay home and avoid contact with others until you get your results (4-5 days)  Avoid travel on public transportation if possible (such as bus, train, or airplane) or Sent to the Emergency Department by EMS for evaluation, COVID-19 testing  and  possible admission depending on your condition and test results.  What to do if you are LOW RISK for COVID-19?  Reduce your risk of any infection by using the same precautions used for avoiding the common cold or flu:  Wash your hands often with soap and warm water for at least 20 seconds.  If soap and water are not readily available,  use an alcohol-based hand sanitizer with at least 60% alcohol.  If coughing or sneezing, cover your mouth and nose by coughing   or sneezing into the elbow areas of your shirt or coat,  into a tissue or into your sleeve (not your hands). Avoid shaking hands with others and consider head nods or verbal greetings only. Avoid touching your eyes, nose, or mouth with unwashed hands.  Avoid close contact with people who are sick. Avoid places or events with large numbers of people in one location, like concerts or sporting events. Carefully consider travel plans you have or are making. If you are planning any travel outside or inside the [place], visit the CDC's Travelers' Health  webpage for the latest health notices. If you have some symptoms but not all symptoms, continue to monitor at home and seek medical attention  if your symptoms worsen. If you are having a medical emergency, call 911. >>>>>>>>>>>>>>>>>>>>>>>>>>>> Preventive Care for Adults  A healthy lifestyle and preventive care can promote health and wellness. Preventive health guidelines for men include the following key practices: A routine yearly physical is a good way to check with your health care provider about your health and preventative screening. It is a chance to share any concerns and updates on your health and to receive a thorough exam. Visit your dentist for a routine exam and preventative care every 6 months. Brush your teeth twice a day and floss once a day. Good oral hygiene prevents tooth decay and gum disease. The frequency of eye exams is based on your age, health, family medical history, use of contact lenses, and other factors. Follow your health care provider's recommendations for frequency of eye exams. Eat a healthy diet. Foods such as vegetables, fruits, whole grains, low-fat dairy products, and lean protein foods contain the nutrients you need without too many calories. Decrease your intake of foods high in solid fats, added sugars, and salt. Eat the right amount of calories for you. Get information about a proper diet from your health care provider, if necessary. Regular physical exercise is one of the most important things you can do for your health. Most adults should get at least 150 minutes of moderate-intensity exercise (any activity that increases your heart rate and causes you to sweat) each week. In addition, most adults need muscle-strengthening exercises on 2 or more days a week. Maintain a healthy weight. The body mass index (BMI) is a screening tool to identify possible weight problems. It provides an estimate of body fat based on height and weight. Your health care provider can  find your BMI and can help you achieve or maintain a healthy weight. For adults 20 years and older: A BMI below 18.5 is considered underweight. A BMI of 18.5 to 24.9 is normal. A BMI of 25 to 29.9 is considered overweight. A BMI of 30 and above is considered obese. Maintain normal blood lipids and cholesterol levels by exercising and minimizing your intake of saturated fat. Eat a balanced diet with plenty of fruit and vegetables. Blood tests for lipids and cholesterol should begin at age 20 and be repeated every 5 years. If your lipid or cholesterol levels are high, you are over 50, or you are at high risk for heart disease, you may need your cholesterol levels checked more frequently. Ongoing high lipid and cholesterol levels should be treated with medicines if diet and exercise are not working. If you smoke, find out from your health care provider how to quit. If you do not use tobacco, do not start. Lung cancer screening is recommended for adults aged 55-80 years who are at high risk for   developing lung cancer because of a history of smoking. A yearly low-dose CT scan of the lungs is recommended for people who have at least a 30-pack-year history of smoking and are a current smoker or have quit within the past 15 years. A pack year of smoking is smoking an average of 1 pack of cigarettes a day for 1 year (for example: 1 pack a day for 30 years or 2 packs a day for 15 years). Yearly screening should continue until the smoker has stopped smoking for at least 15 years. Yearly screening should be stopped for people who develop a health problem that would prevent them from having lung cancer treatment. If you choose to drink alcohol, do not have more than 2 drinks per day. One drink is considered to be 12 ounces (355 mL) of beer, 5 ounces (148 mL) of wine, or 1.5 ounces (44 mL) of liquor. Avoid use of street drugs. Do not share needles with anyone. Ask for help if you need support or instructions about  stopping the use of drugs. High blood pressure causes heart disease and increases the risk of stroke. Your blood pressure should be checked at least every 1-2 years. Ongoing high blood pressure should be treated with medicines, if weight loss and exercise are not effective. If you are 45-79 years old, ask your health care provider if you should take aspirin to prevent heart disease. Diabetes screening involves taking a blood sample to check your fasting blood sugar level. This should be done once every 3 years, after age 45, if you are within normal weight and without risk factors for diabetes. Testing should be considered at a younger age or be carried out more frequently if you are overweight and have at least 1 risk factor for diabetes. Colorectal cancer can be detected and often prevented. Most routine colorectal cancer screening begins at the age of 50 and continues through age 75. However, your health care provider may recommend screening at an earlier age if you have risk factors for colon cancer. On a yearly basis, your health care provider may provide home test kits to check for hidden blood in the stool. Use of a small camera at the end of a tube to directly examine the colon (sigmoidoscopy or colonoscopy) can detect the earliest forms of colorectal cancer. Talk to your health care provider about this at age 50, when routine screening begins. Direct exam of the colon should be repeated every 5-10 years through age 75, unless early forms of precancerous polyps or small growths are found.  Talk with your health care provider about prostate cancer screening. Testicular cancer screening isrecommended for adult males. Screening includes self-exam, a health care provider exam, and other screening tests. Consult with your health care provider about any symptoms you have or any concerns you have about testicular cancer. Use sunscreen. Apply sunscreen liberally and repeatedly throughout the day. You should  seek shade when your shadow is shorter than you. Protect yourself by wearing long sleeves, pants, a wide-brimmed hat, and sunglasses year round, whenever you are outdoors. Once a month, do a whole-body skin exam, using a mirror to look at the skin on your back. Tell your health care provider about new moles, moles that have irregular borders, moles that are larger than a pencil eraser, or moles that have changed in shape or color. Stay current with required vaccines (immunizations). Influenza vaccine. All adults should be immunized every year. Tetanus, diphtheria, and acellular pertussis (Td, Tdap) vaccine. An   adult who has not previously received Tdap or who does not know his vaccine status should receive 1 dose of Tdap. This initial dose should be followed by tetanus and diphtheria toxoids (Td) booster doses every 10 years. Adults with an unknown or incomplete history of completing a 3-dose immunization series with Td-containing vaccines should begin or complete a primary immunization series including a Tdap dose. Adults should receive a Td booster every 10 years. Varicella vaccine. An adult without evidence of immunity to varicella should receive 2 doses or a second dose if he has previously received 1 dose. Human papillomavirus (HPV) vaccine. Males aged 13-21 years who have not received the vaccine previously should receive the 3-dose series. Males aged 22-26 years may be immunized. Immunization is recommended through the age of 26 years for any male who has sex with males and did not get any or all doses earlier. Immunization is recommended for any person with an immunocompromised condition through the age of 26 years if he did not get any or all doses earlier. During the 3-dose series, the second dose should be obtained 4-8 weeks after the first dose. The third dose should be obtained 24 weeks after the first dose and 16 weeks after the second dose. Zoster vaccine. One dose is recommended for adults  aged 60 years or older unless certain conditions are present.  PREVNAR  - Pneumococcal 13-valent conjugate (PCV13) vaccine. When indicated, a person who is uncertain of his immunization history and has no record of immunization should receive the PCV13 vaccine. An adult aged 19 years or older who has certain medical conditions and has not been previously immunized should receive 1 dose of PCV13 vaccine. This PCV13 should be followed with a dose of pneumococcal polysaccharide (PPSV23) vaccine. The PPSV23 vaccine dose should be obtained at least 1 r more year(s) after the dose of PCV13 vaccine. An adult aged 19 years or older who has certain medical conditions and previously received 1 or more doses of PPSV23 vaccine should receive 1 dose of PCV13. The PCV13 vaccine dose should be obtained 1 or more years after the last PPSV23 vaccine dose.  PNEUMOVAX - Pneumococcal polysaccharide (PPSV23) vaccine. When PCV13 is also indicated, PCV13 should be obtained first. All adults aged 65 years and older should be immunized. An adult younger than age 65 years who has certain medical conditions should be immunized. Any person who resides in a nursing home or long-term care facility should be immunized. An adult smoker should be immunized. People with an immunocompromised condition and certain other conditions should receive both PCV13 and PPSV23 vaccines. People with human immunodeficiency virus (HIV) infection should be immunized as soon as possible after diagnosis. Immunization during chemotherapy or radiation therapy should be avoided. Routine use of PPSV23 vaccine is not recommended for American Indians, [place] Natives, or people younger than 65 years unless there are medical conditions that require PPSV23 vaccine. When indicated, people who have unknown immunization and have no record of immunization should receive PPSV23 vaccine. One-time revaccination 5 years after the first dose of PPSV23 is recommended for people  aged 19-64 years who have chronic kidney failure, nephrotic syndrome, asplenia, or immunocompromised conditions. People who received 1-2 doses of PPSV23 before age 65 years should receive another dose of PPSV23 vaccine at age 65 years or later if at least 5 years have passed since the previous dose. Doses of PPSV23 are not needed for people immunized with PPSV23 at or after age 65 years.  Hepatitis A vaccine.   Adults who wish to be protected from this disease, have certain high-risk conditions, work with hepatitis A-infected animals, work in hepatitis A research labs, or travel to or work in countries with a high rate of hepatitis A should be immunized. Adults who were previously unvaccinated and who anticipate close contact with an international adoptee during the first 60 days after arrival in the [place] from a country with a high rate of hepatitis A should be immunized.  Hepatitis B vaccine. Adults should be immunized if they wish to be protected from this disease, have certain high-risk conditions, may be exposed to blood or other infectious body fluids, are household contacts or sex partners of hepatitis B positive people, are clients or workers in certain care facilities, or travel to or work in countries with a high rate of hepatitis B.  Preventive Service / Frequency  Ages 40 to 64 Blood pressure check. Lipid and cholesterol check Lung cancer screening. / Every year if you are aged 55-80 years and have a 30-pack-year history of smoking and currently smoke or have quit within the past 15 years. Yearly screening is stopped once you have quit smoking for at least 15 years or develop a health problem that would prevent you from having lung cancer treatment. Fecal occult blood test (FOBT) of stool. / Every year beginning at age 50 and continuing until age 75. You may not have to do this test if you get a colonoscopy every 10 years. Flexible sigmoidoscopy** or colonoscopy.** / Every 5 years for  a flexible sigmoidoscopy or every 10 years for a colonoscopy beginning at age 50 and continuing until age 75. Screening for abdominal aortic aneurysm (AAA)  by ultrasound is recommended for people who have history of high blood pressure or who are current or former smokers. +++++++++++ Recommend Adult Low Dose Aspirin or  coated  Aspirin 81 mg daily  To reduce risk of Colon Cancer 40 %,  Skin Cancer 26 % ,  Malignant Melanoma 46%  and  Pancreatic cancer 60% ++++++++++++++++++++ Vitamin D goal  is between 70-100.  Please make sure that you are taking your Vitamin D as directed.  It is very important as a natural anti-inflammatory  helping hair, skin, and nails, as well as reducing stroke and heart attack risk.  It helps your bones and helps with mood. It also decreases numerous cancer risks so please take it as directed.  Low Vit D is associated with a 200-300% higher risk for CANCER  and 200-300% higher risk for HEART   ATTACK  &  STROKE.   ...................................... It is also associated with higher death rate at younger ages,  autoimmune diseases like Rheumatoid arthritis, Lupus, Multiple Sclerosis.    Also many other serious conditions, like depression, Alzheimer's Dementia, infertility, muscle aches, fatigue, fibromyalgia - just to name a few. +++++++++++++++++++++ Recommend the book "The END of DIETING" by Dr Joel Fuhrman  & the book "The END of DIABETES " by Dr Joel Fuhrman At [employer] - get book & Audio CD's    Being diabetic has a  300% increased risk for heart attack, stroke, cancer, and alzheimer- type vascular dementia. It is very important that you work harder with diet by avoiding all foods that are white. Avoid white rice (brown & wild rice is OK), white potatoes (sweetpotatoes in moderation is OK), White bread or wheat bread or anything made out of white flour like bagels, donuts, rolls, buns, biscuits, cakes, pastries, cookies, pizza crust, and pasta (made    from white flour & egg whites) - vegetarian pasta or spinach or wheat pasta is OK. Multigrain breads like Arnold's or [employer], or multigrain sandwich thins or flatbreads.  Diet, exercise and weight loss can reverse and cure diabetes in the early stages.  Diet, exercise and weight loss is very important in the control and prevention of complications of diabetes which affects every system in your body, ie. Brain - dementia/stroke, eyes - glaucoma/blindness, heart - heart attack/heart failure, kidneys - dialysis, stomach - gastric paralysis, intestines - malabsorption, nerves - severe painful neuritis, circulation - gangrene & loss of a leg(s), and finally cancer and Alzheimers.    I recommend avoid fried & greasy foods,  sweets/candy, white rice (brown or wild rice or Quinoa is OK), white potatoes (sweet potatoes are OK) - anything made from white flour - bagels, doughnuts, rolls, buns, biscuits,white and wheat breads, pizza crust and traditional pasta made of white flour & egg white(vegetarian pasta or spinach or wheat pasta is OK).  Multi-grain bread is OK - like multi-grain flat bread or sandwich thins. Avoid alcohol in excess. Exercise is also important.    Eat all the vegetables you want - avoid meat, especially red meat and dairy - especially cheese.  Cheese is the most concentrated form of trans-fats which is the worst thing to clog up our arteries. Veggie cheese is OK which can be found in the fresh produce section at [employer] or [employer] or [employer]  ++++++++++++++++++++++ DASH Eating Plan  DASH stands for "Dietary Approaches to Stop Hypertension."   The DASH eating plan is a healthy eating plan that has been shown to reduce high blood pressure (hypertension). Additional health benefits may include reducing the risk of type 2 diabetes mellitus, heart disease, and stroke. The DASH eating plan may also help with weight loss. WHAT DO I NEED TO KNOW ABOUT THE DASH EATING PLAN? For  the DASH eating plan, you will follow these general guidelines: Choose foods with a percent daily value for sodium of less than 5% (as listed on the food label). Use salt-free seasonings or herbs instead of table salt or sea salt. Check with your health care provider or pharmacist before using salt substitutes. Eat lower-sodium products, often labeled as "lower sodium" or "no salt added." Eat fresh foods. Eat more vegetables, fruits, and low-fat dairy products. Choose whole grains. Look for the word "whole" as the first word in the ingredient list. Choose fish  Limit sweets, desserts, sugars, and sugary drinks. Choose heart-healthy fats. Eat veggie cheese  Eat more home-cooked food and less restaurant, buffet, and fast food. Limit fried foods. Cook foods using methods other than frying. Limit canned vegetables. If you do use them, rinse them well to decrease the sodium. When eating at a restaurant, ask that your food be prepared with less salt, or no salt if possible.                      WHAT FOODS CAN I EAT? Read Dr Joel Fuhrman's books on The End of Dieting & The End of Diabetes  Grains Whole grain or whole wheat bread. Brown rice. Whole grain or whole wheat pasta. Quinoa, bulgur, and whole grain cereals. Low-sodium cereals. Corn or whole wheat flour tortillas. Whole grain cornbread. Whole grain crackers. Low-sodium crackers.  Vegetables Fresh or frozen vegetables (raw, steamed, roasted, or grilled). Low-sodium or reduced-sodium tomato and vegetable juices. Low-sodium or reduced-sodium tomato sauce and paste. Low-sodium or reduced-sodium canned vegetables.     Fruits All fresh, canned (in natural juice), or frozen fruits.  Protein Products  All fish and seafood.  Dried beans, peas, or lentils. Unsalted nuts and seeds. Unsalted canned beans.  Dairy Low-fat dairy products, such as skim or 1% milk, 2% or reduced-fat cheeses, low-fat ricotta or cottage cheese, or plain low-fat yogurt.  Low-sodium or reduced-sodium cheeses.  Fats and Oils Tub margarines without trans fats. Light or reduced-fat mayonnaise and salad dressings (reduced sodium). Avocado. Safflower, olive, or canola oils. Natural peanut or almond butter.  Other Unsalted popcorn and pretzels. The items listed above may not be a complete list of recommended foods or beverages. Contact your dietitian for more options.  +++++++++++++++++++  WHAT FOODS ARE NOT RECOMMENDED? Grains/ White flour or wheat flour White bread. White pasta. White rice. Refined cornbread. Bagels and croissants. Crackers that contain trans fat.  Vegetables  Creamed or fried vegetables. Vegetables in a . Regular canned vegetables. Regular canned tomato sauce and paste. Regular tomato and vegetable juices.  Fruits Dried fruits. Canned fruit in light or heavy syrup. Fruit juice.  Meat and Other Protein Products Meat in general - RED meat & White meat.  Fatty cuts of meat. Ribs, chicken wings, all processed meats as bacon, sausage, bologna, salami, fatback, hot dogs, bratwurst and packaged luncheon meats.  Dairy Whole or 2% milk, cream, half-and-half, and cream cheese. Whole-fat or sweetened yogurt. Full-fat cheeses or blue cheese. Non-dairy creamers and whipped toppings. Processed cheese, cheese spreads, or cheese curds.  Condiments Onion and garlic salt, seasoned salt, table salt, and sea salt. Canned and packaged gravies. Worcestershire sauce. Tartar sauce. Barbecue sauce. Teriyaki sauce. Soy sauce, including reduced sodium. Steak sauce. Fish sauce. Oyster sauce. Cocktail sauce. Horseradish. Ketchup and mustard. Meat flavorings and tenderizers. Bouillon cubes. Hot sauce. Tabasco sauce. Marinades. Taco seasonings. Relishes.  Fats and Oils Butter, stick margarine, lard, shortening and bacon fat. Coconut, palm kernel, or palm oils. Regular salad dressings.  Pickles and olives. Salted popcorn and pretzels.  The items listed above may not  be a complete list of foods and beverages to avoid.   

## 2022-07-18 LAB — MICROALBUMIN / CREATININE URINE RATIO
Creatinine, Urine: 149 mg/dL (ref 20–320)
Microalb, Ur: <0.2 mg/dL

## 2022-07-18 LAB — CBC WITH DIFFERENTIAL/PLATELET
Absolute Monocytes: 515 cells/uL (ref 200–950)
Basophils Absolute: 40 cells/uL (ref 0–200)
Basophils Relative: 0.6 %
Eosinophils Absolute: 297 cells/uL (ref 15–500)
Eosinophils Relative: 4.5 %
HCT: 43.7 % (ref 38.5–50.0)
Hemoglobin: 14.5 g/dL (ref 13.2–17.1)
Lymphs Abs: 1663.2 cells/uL (ref 850–3900)
MCH: 30.1 pg (ref 27.0–33.0)
MCHC: 33.2 g/dL (ref 32.0–36.0)
MCV: 90.7 fL (ref 80.0–100.0)
MPV: 9.5 fL (ref 7.5–12.5)
Monocytes Relative: 7.8 %
Neutro Abs: 4085 cells/uL (ref 1500–7800)
Neutrophils Relative %: 61.9 %
Platelets: 220 10*3/uL (ref 140–400)
RBC: 4.82 10*6/uL (ref 4.20–5.80)
RDW: 12.9 % (ref 11.0–15.0)
Total Lymphocyte: 25.2 %
WBC: 6.6 10*3/uL (ref 3.8–10.8)

## 2022-07-18 LAB — COMPLETE METABOLIC PANEL WITH GFR
AG Ratio: 1.8 (calc) (ref 1.0–2.5)
ALT: 24 U/L (ref 9–46)
AST: 23 U/L (ref 10–35)
Albumin: 4.2 g/dL (ref 3.6–5.1)
Alkaline phosphatase (APISO): 76 U/L (ref 35–144)
BUN: 19 mg/dL (ref 7–25)
CO2: 29 mmol/L (ref 20–32)
Calcium: 9.4 mg/dL (ref 8.6–10.3)
Chloride: 106 mmol/L (ref 98–110)
Creat: 1.03 mg/dL (ref 0.70–1.35)
Globulin: 2.4 g/dL (calc) (ref 1.9–3.7)
Glucose, Bld: 86 mg/dL (ref 65–99)
Potassium: 4.4 mmol/L (ref 3.5–5.3)
Sodium: 143 mmol/L (ref 135–146)
Total Bilirubin: 0.6 mg/dL (ref 0.2–1.2)
Total Protein: 6.6 g/dL (ref 6.1–8.1)
eGFR: 83 mL/min/{1.73_m2} (ref >=60)

## 2022-07-18 LAB — URINALYSIS, ROUTINE W REFLEX MICROSCOPIC
Bilirubin Urine: NEGATIVE
Glucose, UA: NEGATIVE
Hgb urine dipstick: NEGATIVE
Ketones, ur: NEGATIVE
Leukocytes,Ua: NEGATIVE
Nitrite: NEGATIVE
Protein, ur: NEGATIVE
Specific Gravity, Urine: 1.023 (ref 1.001–1.035)
pH: 6 (ref 5.0–8.0)

## 2022-07-18 LAB — TSH: TSH: 3.16 mIU/L (ref 0.40–4.50)

## 2022-07-18 LAB — HEMOGLOBIN A1C
Hgb A1c MFr Bld: 5.5 % of total Hgb (ref <5.7)
Mean Plasma Glucose: 111 mg/dL
eAG (mmol/L): 6.2 mmol/L

## 2022-07-18 LAB — LIPID PANEL
Cholesterol: 122 mg/dL (ref <200)
HDL: 49 mg/dL (ref >=40)
LDL Cholesterol (Calc): 50 mg/dL (calc)
Non-HDL Cholesterol (Calc): 73 mg/dL (calc) (ref <130)
Total CHOL/HDL Ratio: 2.5 (calc) (ref <5.0)
Triglycerides: 145 mg/dL (ref <150)

## 2022-07-18 LAB — IRON, TOTAL/TOTAL IRON BINDING CAP
%SAT: 29 % (calc) (ref 20–48)
Iron: 88 ug/dL (ref 50–180)
TIBC: 307 mcg/dL (calc) (ref 250–425)

## 2022-07-18 LAB — PSA: PSA: 0.55 ng/mL (ref <=4.00)

## 2022-07-18 LAB — TESTOSTERONE: Testosterone: 286 ng/dL (ref 250–827)

## 2022-07-18 LAB — VITAMIN B12: Vitamin B-12: 574 pg/mL (ref 200–1100)

## 2022-07-18 LAB — VITAMIN D 25 HYDROXY (VIT D DEFICIENCY, FRACTURES): Vit D, 25-Hydroxy: 83 ng/mL (ref 30–100)

## 2022-07-18 LAB — MAGNESIUM: Magnesium: 2.1 mg/dL (ref 1.5–2.5)

## 2022-07-18 LAB — INSULIN, RANDOM: Insulin: 10.4 u[IU]/mL

## 2022-07-18 NOTE — Progress Notes (Signed)
<><><><><><><><><><><><><><><><><><><><><><><><><><><><><><><><><> <><><><><><><><><><><><><><><><><><><><><><><><><><><><><><><><><> -   Test results slightly outside the reference range are not unusual. If there is anything important, I will review this with you,  otherwise it is considered normal test values.  If you have further questions,  please do not hesitate to contact me at the office or via My Chart.  <><><><><><><><><><><><><><><><><><><><><><><><><><><><><><><><><> <><><><><><><><><><><><><><><><><><><><><><><><><><><><><><><><><>  -  Chol = 122   &   LDL Cjo = 50    - Both    Excellent   - Very low risk for Heart Attack  / Stroke <><><><><><><><><><><><><><><><><><><><><><><><><><><><><><><><><> <><><><><><><><><><><><><><><><><><><><><><><><><><><><><><><><><>  -  A1c - Normal - No Diabetes  - Great ! <><><><><><><><><><><><><><><><><><><><><><><><><><><><><><><><><>  -  Vitamin D = 83  - Excellent - Please keep dose same  <><><><><><><><><><><><><><><><><><><><><><><><><><><><><><><><><>  -  Iron & Vitamin B12 levels - Both Normal & OK  <><><><><><><><><><><><><><><><><><><><><><><><><><><><><><><><><>  -  PSA - Low - No Prostate Cancer - Great  ! <><><><><><><><><><><><><><><><><><><><><><><><><><><><><><><><><> All Else - CBC - Kidneys - Electrolytes - Liver - Magnesium & Thyroid    - all  Normal / OK <><><><><><><><><><><><><><><><><><><><><><><><><><><><><><><><><> <><><><><><><><><><><><><><><><><><><><><><><><><><><><><><><><><>  -  Keep up the [place] Work   <><><><><><><><><><><><><><><><><><><><><><><><><><><><><><><><><> <><><><><><><><><><><><><><><><><><><><><><><><><><><><><><><><><>

## 2022-07-21 ENCOUNTER — Other Ambulatory Visit: Payer: Self-pay | Admitting: Internal Medicine

## 2022-07-21 DIAGNOSIS — K219 Gastro-esophageal reflux disease without esophagitis: Secondary | ICD-10-CM

## 2022-07-21 DIAGNOSIS — I10 Essential (primary) hypertension: Secondary | ICD-10-CM

## 2022-08-13 ENCOUNTER — Ambulatory Visit
Admission: RE | Admit: 2022-08-13 | Discharge: 2022-08-13 | Disposition: A | Discharge status: Home / Self Care | Payer: BC Managed Care – PPO | Source: Ambulatory Visit | Attending: Thoracic Surgery (Cardiothoracic Vascular Surgery) | Admitting: Thoracic Surgery (Cardiothoracic Vascular Surgery)

## 2022-08-13 ENCOUNTER — Ambulatory Visit: Payer: BC Managed Care – PPO | Admitting: Thoracic Surgery (Cardiothoracic Vascular Surgery)

## 2022-08-13 ENCOUNTER — Encounter: Payer: Self-pay | Admitting: Thoracic Surgery (Cardiothoracic Vascular Surgery)

## 2022-08-13 VITALS — BP 134/89 | HR 59 | Resp 20 | Ht 69.0 in | Wt 165.0 lb

## 2022-08-13 DIAGNOSIS — I7121 Aneurysm of the ascending aorta, without rupture: Secondary | ICD-10-CM | POA: Diagnosis not present

## 2022-08-13 DIAGNOSIS — I719 Aortic aneurysm of unspecified site, without rupture: Secondary | ICD-10-CM | POA: Diagnosis not present

## 2022-08-13 MED ORDER — IOPAMIDOL (ISOVUE-370) INJECTION 76%
75.0000 mL | Freq: Once | INTRAVENOUS | Status: AC | PRN
  Administered 2022-08-13: 75 mL via INTRAVENOUS

## 2022-08-13 NOTE — Progress Notes (Signed)
[address]       [place] [address]             [phone]     HPI: [name] returns for a scheduled follow-up visit regarding his ascending aneurysm.  Randy Allen is a 62 year old man with a history of a bicuspid aortic valve, moderate aortic stenosis, ascending aortic aneurysm, hypertension, hyperlipidemia, and hypothyroidism.  He has been followed for an ascending aneurysm since 2021.  It has remained stable over time.  He also has been followed for moderate aortic stenosis by Dr. Tenny Craw.  He continues to work.  He says he does feel tired a lot and seems to be about a little sooner than he did previously.  He denies any chest pain, pressure, tightness, or shortness of breath.    Past Medical History:  Diagnosis Date   Dental crowns present    Heart murmur    states need for antibiotics prior to dental procedures; no cardiologist   History of hepatitis A    age 66   History of MRSA infection 2015   right great toe   Hyperlipidemia    Hypertension    states under control with med., has been on med. > 10 yr.   Hypothyroidism    Mass of skin of right shoulder 04/2015   Neck mass 04/2015   posterior   Seasonal allergies    Vitamin D deficiency       Current Outpatient Medications  Medication Sig Dispense Refill   aspirin EC 81 MG tablet Take 81 mg by mouth daily.     atorvastatin (LIPITOR) 40 MG tablet Take  1 tablet  Daily  for Cholesterol 90 tablet 3   calcium carbonate (TUMS - DOSED IN MG ELEMENTAL CALCIUM) 500 MG chewable tablet Chew 1 tablet by mouth daily.     cetirizine (ZYRTEC) 10 MG tablet Take 10 mg by mouth daily. OTC     enalapril (VASOTEC) 10 MG tablet TAKE ONE TABLET BY MOUTH EVERY DAY FOR BLOOD PRESSURE 90 tablet 0   famotidine (PEPCID) 40 MG tablet take 1 tablet by mouth daily for heart burn and acid indigestion 90 tablet 0   levothyroxine (SYNTHROID) 75 MCG tablet Take  1 tablet  Daily  on an empty stomach with only water for 30 minutes &  no Antacid meds, Calcium or Magnesium for 4 hours & avoid Biotin 90 tablet 3   Multiple Vitamin (MULTIVITAMIN) tablet Take 1 tablet by mouth daily.     OVER THE COUNTER MEDICATION OTC Flonase PRN     No current facility-administered medications for this visit.    Physical Exam BP 134/89 (BP Location: Left Arm, Patient Position: Sitting, Cuff Size: Normal)   Pulse (!) 59   Resp 20   Ht 5\' 9"  (1.753 m)   Wt 165 lb (74.8 kg)   SpO2 98% Comment: RA  BMI 24.65 kg/m  61 year old man in no acute distress Well-developed and well-nourished Alert and oriented x 3 with no focal deficits Cardiac regular rate and rhythm with a 2/6 to 3/6 systolic murmur Lungs clear with equal breath sounds bilaterally, no rales or wheezes No peripheral edema Peripheral pulses intact  Diagnostic Tests: Echocardiogram 06/21/2022 IMPRESSIONS     1. Left ventricular ejection fraction, by estimation, is 60 to 65%. The  left ventricle has normal function. The left ventricle has no regional  wall motion abnormalities. There is moderate asymmetric left ventricular  hypertrophy of the basal-septal  segment. Left ventricular diastolic parameters were normal.   2. Right ventricular systolic function is normal. The right ventricular  size is normal. Tricuspid regurgitation signal is inadequate for assessing  PA pressure.   3. The mitral valve is normal in structure. Trivial mitral valve  regurgitation. No evidence of mitral stenosis.   4. The aortic valve is bicuspid. Fusion of right and noncoronary cusps.  Aortic valve regurgitation is mild. Moderate aortic valve stenosis. Vmax  3.1 m/s, MG 22 mmHg, AVA 1,2 cm^2, DI 0.32   5. The inferior vena cava is normal in size with greater than 50%  respiratory variability, suggesting right atrial pressure of 3 mmHg.   6. Aortic dilatation noted. Aneurysm of the ascending aorta, measuring 52  mm. There is dilatation of the aortic root, measuring 43 mm.  CT ANGIOGRAPHY CHEST  WITH CONTRAST   TECHNIQUE: Multidetector CT imaging of the chest was performed using the standard protocol during bolus administration of intravenous contrast. Multiplanar CT image reconstructions and MIPs were obtained to evaluate the vascular anatomy.   RADIATION DOSE REDUCTION: This exam was performed according to the departmental dose-optimization program which includes automated exposure control, adjustment of the mA and/or kV according to patient size and/or use of iterative reconstruction technique.   CONTRAST:  75mL ISOVUE-370 IOPAMIDOL (ISOVUE-370) INJECTION 76%   COMPARISON:  Multiple priors, most recent chest CT dated February 05, 2022   FINDINGS: Cardiovascular: Normal heart size. No pericardial effusion. Bicuspid aortic valve with mild coronary artery calcifications. Dilated aortic root, measuring up to 4.9 cm and dilated ascending thoracic aorta measuring 5.1 x 5.1 cm, unchanged when compared with prior. Normal caliber descending thoracic aorta. Suspicious filling defects of the pulmonary arteries.   Mediastinum/Nodes: Esophagus and thyroid are unremarkable. No enlarged lymph nodes seen in the chest.   Lungs/Pleura: Central airways are patent. No consolidation, pleural effusion or pneumothorax.   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  No acute abnormality.   Musculoskeletal: No chest wall abnormality. No acute or significant osseous findings.   Review of the MIP images confirms the above findings.   IMPRESSION: 1. Ascending thoracic aortic aneurysm measuring up to 5.1 cm, unchanged when compared with prior. Ascending thoracic aortic aneurysm. Recommend semi-annual imaging followup by CTA or MRA and referral to cardiothoracic surgery if not already obtained. This recommendation follows 2010 ACCF/AHA/AATS/ACR/ASA/SCA/SCAI/SIR/STS/SVM Guidelines for the Diagnosis and Management of Patients With Thoracic  Aortic Disease. Circulation. 2010; 121: Z610-R604. Aortic aneurysm NOS (ICD10-I71.9) 2. 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 3. Mild coronary artery calcifications.     Electronically Signed   By: Allegra Lai M.D.   On: 08/13/2022 10:37 I personally reviewed the CT images.  There is a 5.1 cm ascending aneurysm.  Stable.  No change in focal dissection/pseudoaneurysm in SMA.  Impression: Randy Allen is a 61 year old male with a history of bicuspid aortic valve, moderate aortic stenosis, ascending aortic aneurysm, hypertension, hyperlipidemia, and hypothyroidism.  Ascending aneurysm-stable at 5.1 cm.  Continue semiannual follow-up.  Blood pressure control.  Hypertension-blood pressure 134 systolic today.  He says it runs lower than that at home.  He will continue to monitor and continue current medication regimen.  Bicuspid aortic valve-moderate aortic stenosis but some progression from his last echo.  Followed by Dr. Tenny Craw.  Will have another echo in a year.  Focal SMA dissection/pseudoaneurysm-stable  Plan: Return in 6 months with CT angio of chest   Loreli Slot, MD  [facility] [phone]

## 2022-10-05 ENCOUNTER — Other Ambulatory Visit: Payer: Self-pay | Admitting: Nurse Practitioner

## 2022-10-05 DIAGNOSIS — K219 Gastro-esophageal reflux disease without esophagitis: Secondary | ICD-10-CM

## 2022-10-05 DIAGNOSIS — I10 Essential (primary) hypertension: Secondary | ICD-10-CM

## 2022-10-07 NOTE — Progress Notes (Unsigned)
Assessment and Plan:  There are no diagnoses linked to this encounter.    Further disposition pending results of labs. Discussed med's effects and SE's.   Over 30 minutes of exam, counseling, chart review, and critical decision making was performed.   Future Appointments  Date Time Provider Department Center  10/08/2022 10:45 AM Raynelle Dick, NP [facility] None  10/21/2022  4:00 PM Adela Glimpse, NP [facility] None  01/27/2023  4:00 PM Lucky Cowboy, MD [facility] None  04/30/2023  4:00 PM Adela Glimpse, NP [facility] None  07/30/2023  3:00 PM Lucky Cowboy, MD [facility] None    ------------------------------------------------------------------------------------------------------------------   HPI There were no vitals taken for this visit. 62 y.o.male presents for  Past Medical History:  Diagnosis Date   Dental crowns present    Heart murmur    states need for antibiotics prior to dental procedures; no cardiologist   History of hepatitis A    age 62   History of MRSA infection 2015   right great toe   Hyperlipidemia    Hypertension    states under control with med., has been on med. > 10 yr.   Hypothyroidism    Mass of skin of right shoulder 04/2015   Neck mass 04/2015   posterior   Seasonal allergies    Vitamin D deficiency      Allergies  Allergen Reactions   Propofol Shortness Of Breath    CHEST TIGHTNESS _Patient stated he was told this medication was "pushed to fast" and that is what caused his symptoms   Zoloft [Sertraline Hcl] Other (See Comments)    HYPERACTIVE   Quinolones     Aortic aneurysm    Current Outpatient Medications on File Prior to Visit  Medication Sig   aspirin EC 81 MG tablet Take 81 mg by mouth daily.   atorvastatin (LIPITOR) 40 MG tablet Take  1 tablet  Daily  for Cholesterol   calcium carbonate (TUMS - DOSED IN MG ELEMENTAL CALCIUM) 500 MG chewable tablet Chew 1 tablet by mouth daily.   cetirizine (ZYRTEC) 10 MG tablet Take  10 mg by mouth daily. OTC   enalapril (VASOTEC) 10 MG tablet take 1 tablet by mouth daily for blood pressure   famotidine (PEPCID) 40 MG tablet take 1 tablet by mouth daily for heart burn and acid indigestion   levothyroxine (SYNTHROID) 75 MCG tablet Take  1 tablet  Daily  on an empty stomach with only water for 30 minutes & no Antacid meds, Calcium or Magnesium for 4 hours & avoid Biotin   Multiple Vitamin (MULTIVITAMIN) tablet Take 1 tablet by mouth daily.   OVER THE COUNTER MEDICATION OTC Flonase PRN   No current facility-administered medications on file prior to visit.    ROS: all negative except above.   Physical Exam:  There were no vitals taken for this visit.  General Appearance: Well nourished, in no apparent distress. Eyes: PERRLA, EOMs, conjunctiva no swelling or erythema Sinuses: No Frontal/maxillary tenderness ENT/Mouth: Ext aud canals clear, TMs without erythema, bulging. No erythema, swelling, or exudate on post pharynx.  Tonsils not swollen or erythematous. Hearing normal.  Neck: Supple, thyroid normal.  Respiratory: Respiratory effort normal, BS equal bilaterally without rales, rhonchi, wheezing or stridor.  Cardio: RRR with no MRGs. Brisk peripheral pulses without edema.  Abdomen: Soft, + BS.  Non tender, no guarding, rebound, hernias, masses. Lymphatics: Non tender without lymphadenopathy.  Musculoskeletal: Full ROM, 5/5 strength, normal gait.  Skin: Warm, dry without rashes, lesions, ecchymosis.  Neuro: Cranial nerves intact. Normal muscle tone, no cerebellar symptoms. Sensation intact.  Psych: Awake and oriented X 3, normal affect, Insight and Judgment appropriate.     Raynelle Dick, NP 1:42 PM [facility]

## 2022-10-08 ENCOUNTER — Ambulatory Visit: Payer: BC Managed Care – PPO | Admitting: Nurse Practitioner

## 2022-10-08 ENCOUNTER — Encounter: Payer: Self-pay | Admitting: Nurse Practitioner

## 2022-10-08 VITALS — BP 122/80 | HR 69 | Temp 97.9°F | Ht 69.0 in | Wt 163.4 lb

## 2022-10-08 DIAGNOSIS — R1013 Epigastric pain: Secondary | ICD-10-CM

## 2022-10-08 DIAGNOSIS — I10 Essential (primary) hypertension: Secondary | ICD-10-CM

## 2022-10-08 DIAGNOSIS — R1084 Generalized abdominal pain: Secondary | ICD-10-CM

## 2022-10-08 DIAGNOSIS — I7121 Aneurysm of the ascending aorta, without rupture: Secondary | ICD-10-CM | POA: Diagnosis not present

## 2022-10-08 LAB — CBC WITH DIFFERENTIAL/PLATELET
Eosinophils Relative: 6.6 %
MCH: 30.4 pg (ref 27.0–33.0)
MCHC: 34.3 g/dL (ref 32.0–36.0)
Monocytes Relative: 6.9 %
Neutro Abs: 4283 cells/uL (ref 1500–7800)
Neutrophils Relative %: 64.9 %
Total Lymphocyte: 21 %
WBC: 6.6 10*3/uL (ref 3.8–10.8)

## 2022-10-08 NOTE — Patient Instructions (Signed)
YOU CAN CALL TO MAKE AN ULTRASOUND..  I have put in an order for an ultrasound for you to have You can set them up at your convenience by calling this number [phone] You will likely have the ultrasound at [address] [facility]   If you have any issues call our office and we will set this up for you.    Go to ER if develop sharp ripping sensation in the chest or vomiting of coffee grounds or blood.

## 2022-10-09 LAB — COMPLETE METABOLIC PANEL WITH GFR
AG Ratio: 1.6 (calc) (ref 1.0–2.5)
ALT: 26 U/L (ref 9–46)
AST: 19 U/L (ref 10–35)
Albumin: 4.1 g/dL (ref 3.6–5.1)
Alkaline phosphatase (APISO): 104 U/L (ref 35–144)
BUN: 15 mg/dL (ref 7–25)
CO2: 27 mmol/L (ref 20–32)
Calcium: 9.3 mg/dL (ref 8.6–10.3)
Chloride: 105 mmol/L (ref 98–110)
Creat: 0.94 mg/dL (ref 0.70–1.35)
Globulin: 2.5 g/dL (calc) (ref 1.9–3.7)
Glucose, Bld: 84 mg/dL (ref 65–99)
Potassium: 4.1 mmol/L (ref 3.5–5.3)
Sodium: 140 mmol/L (ref 135–146)
Total Bilirubin: 0.4 mg/dL (ref 0.2–1.2)
Total Protein: 6.6 g/dL (ref 6.1–8.1)
eGFR: 92 mL/min/{1.73_m2} (ref >=60)

## 2022-10-09 LAB — CBC WITH DIFFERENTIAL/PLATELET
Absolute Monocytes: 455 cells/uL (ref 200–950)
Basophils Absolute: 40 cells/uL (ref 0–200)
Basophils Relative: 0.6 %
Eosinophils Absolute: 436 cells/uL (ref 15–500)
HCT: 44.9 % (ref 38.5–50.0)
Hemoglobin: 15.4 g/dL (ref 13.2–17.1)
Lymphs Abs: 1386 cells/uL (ref 850–3900)
MCV: 88.7 fL (ref 80.0–100.0)
MPV: 9.4 fL (ref 7.5–12.5)
Platelets: 234 10*3/uL (ref 140–400)
RBC: 5.06 10*6/uL (ref 4.20–5.80)
RDW: 12.3 % (ref 11.0–15.0)

## 2022-10-16 ENCOUNTER — Ambulatory Visit
Admission: RE | Admit: 2022-10-16 | Discharge: 2022-10-16 | Disposition: A | Discharge status: Home / Self Care | Payer: BC Managed Care – PPO | Source: Ambulatory Visit | Attending: Nurse Practitioner | Admitting: Nurse Practitioner

## 2022-10-16 DIAGNOSIS — K824 Cholesterolosis of gallbladder: Secondary | ICD-10-CM | POA: Diagnosis not present

## 2022-10-16 DIAGNOSIS — N281 Cyst of kidney, acquired: Secondary | ICD-10-CM | POA: Diagnosis not present

## 2022-10-16 DIAGNOSIS — R1084 Generalized abdominal pain: Secondary | ICD-10-CM

## 2022-10-21 ENCOUNTER — Ambulatory Visit: Payer: BC Managed Care – PPO | Admitting: Nurse Practitioner

## 2022-12-25 ENCOUNTER — Other Ambulatory Visit: Payer: Self-pay | Admitting: Thoracic Surgery (Cardiothoracic Vascular Surgery)

## 2022-12-25 DIAGNOSIS — I7121 Aneurysm of the ascending aorta, without rupture: Secondary | ICD-10-CM

## 2022-12-26 ENCOUNTER — Encounter: Payer: Self-pay | Admitting: Nurse Practitioner

## 2023-01-06 ENCOUNTER — Ambulatory Visit: Payer: BC Managed Care – PPO | Admitting: Nurse Practitioner

## 2023-01-06 ENCOUNTER — Encounter: Payer: Self-pay | Admitting: Nurse Practitioner

## 2023-01-06 VITALS — BP 128/90 | HR 69 | Temp 97.9°F | Ht 69.0 in | Wt 160.8 lb

## 2023-01-06 DIAGNOSIS — I10 Essential (primary) hypertension: Secondary | ICD-10-CM | POA: Diagnosis not present

## 2023-01-06 DIAGNOSIS — R5383 Other fatigue: Secondary | ICD-10-CM

## 2023-01-06 DIAGNOSIS — R1084 Generalized abdominal pain: Secondary | ICD-10-CM | POA: Diagnosis not present

## 2023-01-06 DIAGNOSIS — R82998 Other abnormal findings in urine: Secondary | ICD-10-CM

## 2023-01-06 DIAGNOSIS — R519 Headache, unspecified: Secondary | ICD-10-CM

## 2023-01-06 DIAGNOSIS — R1013 Epigastric pain: Secondary | ICD-10-CM | POA: Diagnosis not present

## 2023-01-06 DIAGNOSIS — I7121 Aneurysm of the ascending aorta, without rupture: Secondary | ICD-10-CM | POA: Diagnosis not present

## 2023-01-06 DIAGNOSIS — N182 Chronic kidney disease, stage 2 (mild): Secondary | ICD-10-CM

## 2023-01-06 DIAGNOSIS — R195 Other fecal abnormalities: Secondary | ICD-10-CM

## 2023-01-06 DIAGNOSIS — Z79899 Other long term (current) drug therapy: Secondary | ICD-10-CM

## 2023-01-06 MED ORDER — ENALAPRIL MALEATE 10 MG PO TABS
ORAL_TABLET | ORAL | 0 refills | Status: DC
Start: 2023-01-06 — End: 2023-02-05

## 2023-01-06 NOTE — Addendum Note (Signed)
Addended by: Adela Glimpse on: 01/06/2023 04:44 PM   Modules accepted: Orders

## 2023-01-06 NOTE — Progress Notes (Signed)
Assessment and Plan:  Kindle was seen today for abdominal pain.  Diagnoses and all orders for this visit:  Essential hypertension Elevated in clinic - takes BP medications in the afternoon. Continue Enalapril as directed. Discussed DASH (Dietary Approaches to Stop Hypertension) DASH diet is lower in sodium than a typical American diet. Cut back on foods that are high in saturated fat, cholesterol, and trans fats. Eat more whole-grain foods, fish, poultry, and nuts During current abd flare continue bland, clear liquid diet. Remain active and exercise as tolerated daily.  Monitor BP at home-Call if greater than 130/80 and contact office  If remains elevated and uncontrolled.  Generalized abdominal pain/Epigastric pain/Light stools/dark urine Pain noted as discomfort - no guarding or pain upon palpation Complete Abdomen and Plevis CT scan ordered to further assess probable hepatic steatosis and 4 mm gallbladder polyp with small simple cysts of the kidneys seen on US imaging 10/16/22. Obtain CBC, CMP, UA with culture.   Report to ER for any increase in N/V, fever, chills, uncontrolled pain, blood in stool or urine.    CKD Stage 2 Stay well hydrated. Avoid high salt foods. Avoid NSAIDS. Keep BP and well controlled.   Take medications as prescribed. Remain active and exercise as tolerated daily. Monitor weight  Aneurysm of ascending aorta without rupture Stable - last f/u 07/2022 Cardiology following, Dr. Dorris Fetch [facility] Continue yearly CT Imaging Advised if gets ripping severe pain in upper abdomen or coffee ground/blood emesis go to ER immediately  Other fatigue CBC, CMP, UA w/Cx, ABD/Pelvis CT  Non-intractable HA Push fluids CBC obtained to review any underlying infectious etiology  Orders Placed This Encounter  Procedures   Urine Culture   CT ABDOMEN PELVIS W CONTRAST    Patient had [place] completed 10/16/2022 at [facility] -  not sure if they  have CT availability. If not, then okay to complete wherever necessary.  US Impression: 1. No acute finding by ultrasound. 2. Probable hepatic steatosis. 3. 4 mm gallbladder polyp. No follow-up recommended. 4. Small simple cysts of the kidneys. No follow-up recommended.    Standing Status:   Future    Standing Expiration Date:   01/06/2024    Order Specific Question:   If indicated for the ordered procedure, I authorize the administration of contrast media per Radiology protocol    Answer:   Yes    Order Specific Question:   Does the patient have a contrast media/X-ray dye allergy?    Answer:   No    Order Specific Question:   Preferred imaging location?    Answer:   [facility]    Order Specific Question:   If indicated for the ordered procedure, I authorize the administration of oral contrast media per Radiology protocol    Answer:   Yes   CBC with Differential/Platelet   COMPLETE METABOLIC PANEL WITH GFR   Urinalysis, Routine w reflex microscopic    Notify office for further evaluation and treatment, questions or concerns if any reported s/s fail to improve.   The patient was advised to call back or seek an in-person evaluation if any symptoms worsen or if the condition fails to improve as anticipated.   Further disposition pending results of labs. Discussed med's effects and SE's.    I discussed the assessment and treatment plan with the patient. The patient was provided an opportunity to ask questions and all were answered. The patient agreed with the plan and demonstrated an understanding of  the instructions.  Discussed med's effects and SE's. Screening labs and tests as requested with regular follow-up as recommended.  I provided 30 minutes of face-to-face time during this encounter including counseling, chart review, and critical decision making was preformed.  Today's Plan of Care is based on a patient-centered health care approach known as shared decision making - the  decisions, tests and treatments allow for patient preferences and values to be balanced with clinical evidence.     Future Appointments  Date Time Provider Department Center  01/27/2023  4:00 PM Lucky Cowboy, MD [facility] None  02/25/2023 12:20 PM [facility] 1 [facility] [facility]  02/25/2023  1:30 PM Loreli Slot, MD [facility]  04/30/2023  4:00 PM Adela Glimpse, NP [facility] None  08/14/2023  3:00 PM Lucky Cowboy, MD [facility] None    ------------------------------------------------------------------------------------------------------------------   HPI BP (!) 128/90   Pulse 69   Temp 97.9 F (36.6 C)   Ht 5\' 9"  (1.753 m)   Wt 160 lb 12.8 oz (72.9 kg)   SpO2 98%   BMI 23.75 kg/m   62 y.o.male presents for secondary onset of white colored stools associated with dark urine x10 days.  Associated abdominal discomfort and bloating but no pain.  Discomfort is generalized and centralized in the epigastric area.  He has a hx of GERD which he treats with TUMS and Pepcid, currently effective.  Denies N/V but does report chills with low grade fever of 99.7 upon initial start of symptoms.    Felt as though he was "coming down with something."  Had overall fatigue and slept for 3 days. He felt as though he was potentially coming down with Covid, but home test was negative.   Denies respiratory symptoms.  Associated HA which is not uncommon for him.  Does take occasional Excedrin which responds intermittently.  HA is still present and noted to be at the bilateral temporal area.  At times HA will be occipital.  Reports trying to stay well hydrated.  Has eaten bland foods over the last week including chicken, rice, yogurt.    He was/evaluated in office 10/2021 for similar presentation of symptoms.  H. Pylori was negative.  He had abd [place] completed that revealed no acute findings other than probable hepatic steatosis (LFT's WNL 09/2022), a 4 mm gallbladder polyp which did not  require follow-up as well as small simple cysts of the kidneys which also required no further follow up at that time.  The pancreases was not visualized due to bowel gas. Last BM was this morning at 0600. Denies yellowing of skin or eyes.   Patient states his BM's returned back to normal after work up in 10/2022.  This includes Bristol Type 4, and once daily.  However upon onset of BM changes 10 days ago he continues to have the white colored stool, diarrhea with some formation, and dark tea colored urine.  Denies gross hematuria, frequency, urgency.  He does note some mild discomfort to bilateral flanks.    He does report having a root canal completed approximately 1 week prior to onset of symptoms to which he took abx prophylactic tmt.    Review of PMI notes Hepatitis A at age 49.    BP currently well controlled with enalapril 10 mg QD BP Readings from Last 3 Encounters:  01/06/23 (!) 128/90  10/08/22 122/80  08/13/22 134/89   He does follow with [facility] last OV 4/204. Due to bicuspid aortic valve,  moderate aortic stenosis, ascending aortic aneurysm.  He has been followed for an ascending aneurysm since 2021.  It has remained stable over time.  He also has been followed for moderate aortic stenosis by Dr. Tenny Craw.   He continues to work.  He says he does feel tired a lot.  He denies any chest pain, pressure, tightness, or shortness of breath.   BMI is Body mass index is 23.75 kg/m., he has not been working on diet and exercise, however, continues to drop weight.  Wt Readings from Last 3 Encounters:  01/06/23 160 lb 12.8 oz (72.9 kg)  10/08/22 163 lb 6.4 oz (74.1 kg)  08/13/22 165 lb (74.8 kg)   Last GFR: Lab Results  Component Value Date   EGFR 92 10/08/2022    Past Medical History:  Diagnosis Date   Dental crowns present    Heart murmur    states need for antibiotics prior to dental procedures; no cardiologist   History of hepatitis A    age 28   History of  MRSA infection 2015   right great toe   Hyperlipidemia    Hypertension    states under control with med., has been on med. > 10 yr.   Hypothyroidism    Mass of skin of right shoulder 04/2015   Neck mass 04/2015   posterior   Seasonal allergies    Vitamin D deficiency      Allergies  Allergen Reactions   Propofol Shortness Of Breath    CHEST TIGHTNESS _Patient stated he was told this medication was "pushed to fast" and that is what caused his symptoms   Zoloft [Sertraline Hcl] Other (See Comments)    HYPERACTIVE   Quinolones     Aortic aneurysm    Current Outpatient Medications on File Prior to Visit  Medication Sig   aspirin EC 81 MG tablet Take 81 mg by mouth daily.   atorvastatin (LIPITOR) 40 MG tablet Take  1 tablet  Daily  for Cholesterol   calcium carbonate (TUMS - DOSED IN MG ELEMENTAL CALCIUM) 500 MG chewable tablet Chew 1 tablet by mouth daily.   cetirizine (ZYRTEC) 10 MG tablet Take 10 mg by mouth daily. OTC   Cyanocobalamin (B-12) 5000 MCG CAPS Take by mouth.   famotidine (PEPCID) 40 MG tablet take 1 tablet by mouth daily for heart burn and acid indigestion   Fexofenadine HCl (ALLEGRA PO) Take by mouth.   levothyroxine (SYNTHROID) 75 MCG tablet Take  1 tablet  Daily  on an empty stomach with only water for 30 minutes & no Antacid meds, Calcium or Magnesium for 4 hours & avoid Biotin   Multiple Vitamin (MULTIVITAMIN) tablet Take 1 tablet by mouth daily. Centrum Silver   OVER THE COUNTER MEDICATION OTC Flonase PRN   No current facility-administered medications on file prior to visit.    ROS: all negative except above.   Physical Exam:  BP (!) 128/90   Pulse 69   Temp 97.9 F (36.6 C)   Ht 5\' 9"  (1.753 m)   Wt 160 lb 12.8 oz (72.9 kg)   SpO2 98%   BMI 23.75 kg/m   General Appearance: Well nourished, in no apparent distress. Eyes: PERRLA, EOMs, conjunctiva no swelling or erythema Sinuses: No Frontal/maxillary tenderness ENT/Mouth: Ext aud canals clear, TMs  without erythema, bulging. No erythema, swelling, or exudate on post pharynx.  Tonsils not swollen or erythematous. Hearing normal.  Neck: Supple, thyroid normal.  Respiratory: Respiratory effort normal, BS equal bilaterally  without rales, rhonchi, wheezing or stridor.  Cardio: Regular rhythm , loud murmur Grade 3 at left sternal border. Brisk peripheral pulses without edema.  Abdomen: Soft, + BS.  No tenderness noted. Negative McBurneys and Rosving's signs.  Taut.   Lymphatics: Non tender without lymphadenopathy.  Musculoskeletal: Full ROM, 5/5 strength, normal gait.  Skin: Warm, dry without rashes, lesions, ecchymosis.  Neuro: Cranial nerves intact. Normal muscle tone, no cerebellar symptoms. Sensation intact.  Psych: Awake and oriented X 3, normal affect, Insight and Judgment appropriate.     Adela Glimpse, NP 11:11 AM  [facility]

## 2023-01-06 NOTE — Patient Instructions (Signed)
Bland Diet A bland diet may consist of soft foods or foods that are not high in fat or are not greasy, acidic, or spicy. Avoiding certain foods may cause less irritation to your mouth, throat, stomach, or gastrointestinal tract. Avoiding certain foods may make you feel better. Everyone's tolerances are different. A bland diet should be based on what you can tolerate and what may cause discomfort. What is my plan? Your health care provider or dietitian may recommend specific changes to your diet to treat your symptoms. These changes may include: Eating small meals frequently. Cooking food until it is soft enough to chew easily. Taking the time to chew your food thoroughly, so it is easy to swallow and digest. Avoiding foods that cause you discomfort. These may include spicy food, fried food, greasy foods, hard-to-chew foods, or citrus fruits and juices. Drinking slowly. What are tips for following this plan? Reading food labels To reduce fiber intake, look for food labels that say "whole," such as whole wheat or whole grain. Shopping Avoid food items that may have nuts or seeds. Avoid vegetables that may make you gassy or have a tough texture, such as broccoli, cauliflower, or corn. Cooking Cook foods thoroughly so they have a soft texture. Meal planning Make sure you include foods from all food groups to eat a balanced diet. Eat a variety of types of foods. Eat foods and drink beverages that do not cause you discomfort. These may include soups and broths with cooked meats, pasta, and vegetables. Lifestyle Sit up after meals, avoid tight clothing, and take time to eat and chew your food slowly. Ask your health care provider whether you should take dietary supplements. General information Mildly season your foods. Some seasonings, such as cayenne pepper, vinegar, or hot sauce, may cause irritation. The foods, beverages, or seasonings to avoid should be based on individual tolerance. What  foods should I eat? Fruits Canned or cooked fruit such as peaches, pears, or applesauce. Bananas. Vegetables Well-cooked vegetables. Canned or cooked vegetables such as carrots, green beans, beets, or spinach. Mashed or boiled potatoes. Grains  Hot cereals, such as cream of wheat and processed oatmeal. Rice. Bread, crackers, pasta, or tortillas made from refined white flour. Meats and other proteins  Eggs. Creamy peanut butter or other nut butters. Lean, well-cooked tender meats, such as beef, pork, chicken, or fish. Dairy Low-fat dairy products such as milk, cottage cheese, or yogurt. Beverages  Water. Herbal tea. Apple juice. Fats and oils Mild salad dressings. Canola or olive oil. Sweets and desserts Low-fat pudding, custard, or ice cream. Fruit gelatin. The items listed above may not be a complete list of foods and beverages you can eat. Contact a dietitian for more information. What foods should I avoid? Fruits Citrus fruits, such as oranges and grapefruit. Fruits with a stringy texture. Fruits that have lots of seeds, such as kiwi or strawberries. Dried fruits. Vegetables Raw, uncooked vegetables. Salads. Grains Whole grain breads, muffins, and cereals. Meats and other proteins Tough, fibrous meats. Highly seasoned meat such as corned beef, smoked meats, or fish. Processed high-fat meats such as brats, hot dogs, or sausage. Dairy Full-fat dairy foods such as ice cream and cheese. Beverages Caffeinated drinks. Alcohol. Seasonings and condiments Strongly flavored seasonings or condiments. Hot sauce. Salsa. Other foods Spicy foods. Fried or greasy foods. Sour foods, such as pickled or fermented foods like sauerkraut. Foods high in fiber. The items listed above may not be a complete list of foods and beverages you should  avoid. Contact a dietitian for more information. Summary A bland diet should be based on individual tolerance. It may consist of foods that are soft  textured and do not have a lot of fat, fiber, acid, or seasonings. A bland diet may be recommended because avoiding certain foods, beverages, or spices may make you feel better. This information is not intended to replace advice given to you by your health care provider. Make sure you discuss any questions you have with your health care provider. Document Revised: 02/19/2021 Document Reviewed: 02/19/2021 Elsevier Patient Education  2024 [employer].

## 2023-01-07 ENCOUNTER — Other Ambulatory Visit: Payer: Self-pay | Admitting: Nurse Practitioner

## 2023-01-07 DIAGNOSIS — R1013 Epigastric pain: Secondary | ICD-10-CM

## 2023-01-07 DIAGNOSIS — R7989 Other specified abnormal findings of blood chemistry: Secondary | ICD-10-CM

## 2023-01-07 DIAGNOSIS — R1084 Generalized abdominal pain: Secondary | ICD-10-CM

## 2023-01-07 DIAGNOSIS — R17 Unspecified jaundice: Secondary | ICD-10-CM

## 2023-01-07 DIAGNOSIS — R82998 Other abnormal findings in urine: Secondary | ICD-10-CM

## 2023-01-07 DIAGNOSIS — R195 Other fecal abnormalities: Secondary | ICD-10-CM

## 2023-01-07 LAB — COMPLETE METABOLIC PANEL WITH GFR
AG Ratio: 1.6 (calc) (ref 1.0–2.5)
ALT: 356 U/L — ABNORMAL HIGH (ref 9–46)
AST: 225 U/L — ABNORMAL HIGH (ref 10–35)
Albumin: 4.3 g/dL (ref 3.6–5.1)
Alkaline phosphatase (APISO): 660 U/L — ABNORMAL HIGH (ref 35–144)
BUN: 13 mg/dL (ref 7–25)
CO2: 26 mmol/L (ref 20–32)
Calcium: 9.7 mg/dL (ref 8.6–10.3)
Chloride: 106 mmol/L (ref 98–110)
Creat: 0.9 mg/dL (ref 0.70–1.35)
Globulin: 2.7 g/dL (calc) (ref 1.9–3.7)
Glucose, Bld: 81 mg/dL (ref 65–99)
Potassium: 4.4 mmol/L (ref 3.5–5.3)
Sodium: 140 mmol/L (ref 135–146)
Total Bilirubin: 5.1 mg/dL — ABNORMAL HIGH (ref 0.2–1.2)
Total Protein: 7 g/dL (ref 6.1–8.1)
eGFR: 97 mL/min/{1.73_m2} (ref >=60)

## 2023-01-07 LAB — URINALYSIS, ROUTINE W REFLEX MICROSCOPIC
Bilirubin Urine: NEGATIVE
Glucose, UA: NEGATIVE
Hgb urine dipstick: NEGATIVE
Ketones, ur: NEGATIVE
Leukocytes,Ua: NEGATIVE
Nitrite: NEGATIVE
Protein, ur: NEGATIVE
Specific Gravity, Urine: 1.006 (ref 1.001–1.035)
pH: 5.5 (ref 5.0–8.0)

## 2023-01-07 LAB — CBC WITH DIFFERENTIAL/PLATELET
Absolute Monocytes: 537 cells/uL (ref 200–950)
Basophils Absolute: 43 cells/uL (ref 0–200)
Basophils Relative: 0.7 %
Eosinophils Absolute: 262 cells/uL (ref 15–500)
Eosinophils Relative: 4.3 %
HCT: 46.2 % (ref 38.5–50.0)
Hemoglobin: 15 g/dL (ref 13.2–17.1)
Lymphs Abs: 1238 cells/uL (ref 850–3900)
MCH: 29.4 pg (ref 27.0–33.0)
MCHC: 32.5 g/dL (ref 32.0–36.0)
MCV: 90.6 fL (ref 80.0–100.0)
MPV: 10.3 fL (ref 7.5–12.5)
Monocytes Relative: 8.8 %
Neutro Abs: 4020 cells/uL (ref 1500–7800)
Neutrophils Relative %: 65.9 %
Platelets: 259 10*3/uL (ref 140–400)
RBC: 5.1 10*6/uL (ref 4.20–5.80)
RDW: 12.8 % (ref 11.0–15.0)
Total Lymphocyte: 20.3 %
WBC: 6.1 10*3/uL (ref 3.8–10.8)

## 2023-01-07 LAB — URINE CULTURE
MICRO NUMBER:: 15503850
Result:: NO GROWTH
SPECIMEN QUALITY:: ADEQUATE

## 2023-01-09 ENCOUNTER — Encounter: Payer: Self-pay | Admitting: Gastroenterology

## 2023-01-10 ENCOUNTER — Ambulatory Visit
Admission: RE | Admit: 2023-01-10 | Discharge: 2023-01-10 | Disposition: A | Discharge status: Home / Self Care | Payer: BC Managed Care – PPO | Source: Ambulatory Visit | Attending: Nurse Practitioner | Admitting: Nurse Practitioner

## 2023-01-10 DIAGNOSIS — R1013 Epigastric pain: Secondary | ICD-10-CM | POA: Diagnosis not present

## 2023-01-10 DIAGNOSIS — R1084 Generalized abdominal pain: Secondary | ICD-10-CM

## 2023-01-10 DIAGNOSIS — N182 Chronic kidney disease, stage 2 (mild): Secondary | ICD-10-CM

## 2023-01-10 DIAGNOSIS — R195 Other fecal abnormalities: Secondary | ICD-10-CM

## 2023-01-10 DIAGNOSIS — R82998 Other abnormal findings in urine: Secondary | ICD-10-CM | POA: Diagnosis not present

## 2023-01-10 MED ORDER — IOPAMIDOL (ISOVUE-300) INJECTION 61%
200.0000 mL | Freq: Once | INTRAVENOUS | Status: AC | PRN
  Administered 2023-01-10: 100 mL via INTRAVENOUS

## 2023-01-13 ENCOUNTER — Encounter: Payer: Self-pay | Admitting: Nurse Practitioner

## 2023-01-13 ENCOUNTER — Other Ambulatory Visit: Payer: BC Managed Care – PPO

## 2023-01-13 ENCOUNTER — Other Ambulatory Visit: Payer: Self-pay | Admitting: Nurse Practitioner

## 2023-01-13 DIAGNOSIS — R17 Unspecified jaundice: Secondary | ICD-10-CM

## 2023-01-13 DIAGNOSIS — R7989 Other specified abnormal findings of blood chemistry: Secondary | ICD-10-CM | POA: Diagnosis not present

## 2023-01-13 DIAGNOSIS — R82998 Other abnormal findings in urine: Secondary | ICD-10-CM

## 2023-01-13 DIAGNOSIS — R195 Other fecal abnormalities: Secondary | ICD-10-CM

## 2023-01-14 ENCOUNTER — Other Ambulatory Visit: Payer: BC Managed Care – PPO

## 2023-01-14 ENCOUNTER — Ambulatory Visit: Payer: BC Managed Care – PPO | Admitting: Gastroenterology

## 2023-01-14 ENCOUNTER — Encounter: Payer: Self-pay | Admitting: Gastroenterology

## 2023-01-14 VITALS — BP 140/90 | HR 80 | Ht 68.0 in | Wt 157.0 lb

## 2023-01-14 DIAGNOSIS — R634 Abnormal weight loss: Secondary | ICD-10-CM

## 2023-01-14 DIAGNOSIS — R7989 Other specified abnormal findings of blood chemistry: Secondary | ICD-10-CM

## 2023-01-14 DIAGNOSIS — R1013 Epigastric pain: Secondary | ICD-10-CM | POA: Diagnosis not present

## 2023-01-14 DIAGNOSIS — C250 Malignant neoplasm of head of pancreas: Secondary | ICD-10-CM

## 2023-01-14 HISTORY — DX: Malignant neoplasm of head of pancreas: C25.0

## 2023-01-14 LAB — CBC WITH DIFFERENTIAL/PLATELET
Absolute Monocytes: 625 {cells}/uL (ref 200–950)
Basophils Absolute: 53 {cells}/uL (ref 0–200)
Basophils Absolute: 0.1 10*3/uL (ref 0.0–0.1)
Basophils Relative: 0.9 %
Basophils Relative: 1.1 % (ref 0.0–3.0)
Eosinophils Absolute: 289 {cells}/uL (ref 15–500)
Eosinophils Absolute: 0.3 10*3/uL (ref 0.0–0.7)
Eosinophils Relative: 4.9 %
Eosinophils Relative: 5.6 % — ABNORMAL HIGH (ref 0.0–5.0)
HCT: 44.3 % (ref 38.5–50.0)
HCT: 45.1 % (ref 39.0–52.0)
Hemoglobin: 14.4 g/dL (ref 13.2–17.1)
Hemoglobin: 15 g/dL (ref 13.0–17.0)
Lymphocytes Relative: 23.6 % (ref 12.0–46.0)
Lymphs Abs: 1369 {cells}/uL (ref 850–3900)
Lymphs Abs: 1.3 10*3/uL (ref 0.7–4.0)
MCH: 29.3 pg (ref 27.0–33.0)
MCHC: 32.5 g/dL (ref 32.0–36.0)
MCHC: 33.2 g/dL (ref 30.0–36.0)
MCV: 90.2 fL (ref 80.0–100.0)
MCV: 89.2 fL (ref 78.0–100.0)
MPV: 10.9 fL (ref 7.5–12.5)
Monocytes Absolute: 0.6 10*3/uL (ref 0.1–1.0)
Monocytes Relative: 10.6 %
Monocytes Relative: 10.4 % (ref 3.0–12.0)
Neutro Abs: 3564 {cells}/uL (ref 1500–7800)
Neutro Abs: 3.2 10*3/uL (ref 1.4–7.7)
Neutrophils Relative %: 60.4 %
Neutrophils Relative %: 59.3 % (ref 43.0–77.0)
Platelets: 284 10*3/uL (ref 140–400)
Platelets: 279 10*3/uL (ref 150.0–400.0)
RBC: 4.91 10*6/uL (ref 4.20–5.80)
RBC: 5.06 Mil/uL (ref 4.22–5.81)
RDW: 13.4 % (ref 11.0–15.0)
RDW: 15.4 % (ref 11.5–15.5)
Total Lymphocyte: 23.2 %
WBC: 5.9 10*3/uL (ref 3.8–10.8)
WBC: 5.3 10*3/uL (ref 4.0–10.5)

## 2023-01-14 LAB — COMPREHENSIVE METABOLIC PANEL
ALT: 450 U/L — ABNORMAL HIGH (ref 0–53)
AST: 330 U/L — ABNORMAL HIGH (ref 0–37)
Albumin: 4.2 g/dL (ref 3.5–5.2)
Alkaline Phosphatase: 863 U/L — ABNORMAL HIGH (ref 39–117)
BUN: 11 mg/dL (ref 6–23)
CO2: 31 meq/L (ref 19–32)
Calcium: 9.8 mg/dL (ref 8.4–10.5)
Chloride: 100 meq/L (ref 96–112)
Creatinine, Ser: 1.15 mg/dL (ref 0.40–1.50)
GFR: 68.49 mL/min (ref >60.00)
Glucose, Bld: 118 mg/dL — ABNORMAL HIGH (ref 70–99)
Potassium: 3.6 meq/L (ref 3.5–5.1)
Sodium: 139 meq/L (ref 135–145)
Total Bilirubin: 8.9 mg/dL — ABNORMAL HIGH (ref 0.2–1.2)
Total Protein: 7.5 g/dL (ref 6.0–8.3)

## 2023-01-14 LAB — IBC + FERRITIN
Ferritin: 783.1 ng/mL — ABNORMAL HIGH (ref 22.0–322.0)
Iron: 69 ug/dL (ref 42–165)
Saturation Ratios: 18.4 % — ABNORMAL LOW (ref 20.0–50.0)
TIBC: 375.2 ug/dL (ref 250.0–450.0)
Transferrin: 268 mg/dL (ref 212.0–360.0)

## 2023-01-14 LAB — COMPLETE METABOLIC PANEL WITH GFR
AG Ratio: 1.6 (calc) (ref 1.0–2.5)
ALT: 382 U/L — ABNORMAL HIGH (ref 9–46)
AST: 267 U/L — ABNORMAL HIGH (ref 10–35)
Albumin: 4 g/dL (ref 3.6–5.1)
Alkaline phosphatase (APISO): 784 U/L — ABNORMAL HIGH (ref 35–144)
BUN: 12 mg/dL (ref 7–25)
CO2: 28 mmol/L (ref 20–32)
Calcium: 9.4 mg/dL (ref 8.6–10.3)
Chloride: 101 mmol/L (ref 98–110)
Creat: 1.21 mg/dL (ref 0.70–1.35)
Globulin: 2.5 g/dL (ref 1.9–3.7)
Glucose, Bld: 98 mg/dL (ref 65–99)
Potassium: 4 mmol/L (ref 3.5–5.3)
Sodium: 139 mmol/L (ref 135–146)
Total Bilirubin: 7.4 mg/dL — ABNORMAL HIGH (ref 0.2–1.2)
Total Protein: 6.5 g/dL (ref 6.1–8.1)
eGFR: 68 mL/min/{1.73_m2} (ref >=60)

## 2023-01-14 LAB — REFLEX TIQ

## 2023-01-14 LAB — HEPATITIS PANEL(REFL)
HEPATITIS C ANTIBODY REFILL$(REFL): NONREACTIVE
Hep B Core Total Ab: NONREACTIVE
Hep B S Ab: NONREACTIVE
Hepatitis A AB,Total: REACTIVE — AB
Hepatitis B Surface Ag: NONREACTIVE

## 2023-01-14 LAB — PROTIME-INR
INR: 1.2 {ratio} — ABNORMAL HIGH (ref 0.8–1.0)
Prothrombin Time: 12.6 s (ref 9.6–13.1)

## 2023-01-14 LAB — GAMMA GT: GGT: 1485 U/L — ABNORMAL HIGH (ref 7–51)

## 2023-01-14 LAB — LIPASE: Lipase: 44 U/L (ref 11.0–59.0)

## 2023-01-14 NOTE — Progress Notes (Signed)
Chief Complaint: Elevated LFTs Primary GI MD: (Dr. Arlyce Dice)  HPI: 62 year old male with medical history as listed below presents for evaluation of elevated LFTs  Labs were normal 10/08/2022  Lab work 01/06/2023 AST 225/ALT 356/alk phos 660 Total bilirubin 5.1 Normal CBC  Lab work 01/13/2023 AST 267/ALT 382/alk phos 784 Total bilirubin 7.4 Normal CBC Hep A AB total reactive Heb B S ab, surface antigen - negative Hep B core total ab non reactive HCV negative  US abdomen complete 10/16/2022 for abdominal pain showed probable hepatic steatosis.  4 mm gallbladder polyp.  Simple cysts of the kidneys.  CBD 3.1 mm.  CT abdomen pelvis with contrast 01/10/2023 - No focal liver abnormality seen.  No gallstones or gallbladder wall thickening or biliary dilation.  No pancreatic ductal changes or surrounding inflammation.  Stomach is normal.  No acute findings.  Patient reports dull constant epigastric pain ongoing for the last 2 weeks.  Also reports dark urine and pale stools for the same duration.  No previous episodes of this.  Social alcohol use (2-3 drinks per week).  Denies family history.  States he is lost a total of 15 pounds since April 2024 unintentionally.  In April he did lose his cat which caused him to go into a grieving process in which he reports losing 5 to 6 pounds.  He then states over the last month he has had some early satiety and bloating with eating which makes him not want to eat and that is when he lost the additional 10 pounds.  Denies melena/hematochezia.   PREVIOUS GI WORKUP   Colonoscopy 2015 for screening - Mild diverticulosis in sigmoid colon - Otherwise normal - Repeat 10 years  Past Medical History:  Diagnosis Date   Dental crowns present    Heart murmur    states need for antibiotics prior to dental procedures; no cardiologist   History of hepatitis A    age 54   History of MRSA infection 2015   right great toe   Hyperlipidemia    Hypertension     states under control with med., has been on med. > 10 yr.   Hypothyroidism    Mass of skin of right shoulder 04/2015   Neck mass 04/2015   posterior   Seasonal allergies    Vitamin D deficiency     Past Surgical History:  Procedure Laterality Date   COLONOSCOPY WITH PROPOFOL  10/21/2013   EYE SURGERY Right 1966   HYDROCELE EXCISION     MASS EXCISION Right 04/26/2015   Procedure: EXCISION MASS NECK AND RIGHT SHOULDER ;  Surgeon: Luretha Murphy, MD;  Location: [facility];  Service: General;  Laterality: Right;    Current Outpatient Medications  Medication Sig Dispense Refill   aspirin EC 81 MG tablet Take 81 mg by mouth daily.     atorvastatin (LIPITOR) 40 MG tablet Take  1 tablet  Daily  for Cholesterol 90 tablet 3   calcium carbonate (TUMS - DOSED IN MG ELEMENTAL CALCIUM) 500 MG chewable tablet Chew 1 tablet by mouth daily.     cetirizine (ZYRTEC) 10 MG tablet Take 10 mg by mouth daily. OTC     Cyanocobalamin (B-12) 5000 MCG CAPS Take by mouth.     enalapril (VASOTEC) 10 MG tablet take 1 tablet by mouth daily for blood pressure 90 tablet 0   famotidine (PEPCID) 40 MG tablet take 1 tablet by mouth daily for heart burn and acid indigestion 90 tablet 0  Fexofenadine HCl (ALLEGRA PO) Take by mouth.     levothyroxine (SYNTHROID) 75 MCG tablet Take  1 tablet  Daily  on an empty stomach with only water for 30 minutes & no Antacid meds, Calcium or Magnesium for 4 hours & avoid Biotin 90 tablet 3   Multiple Vitamin (MULTIVITAMIN) tablet Take 1 tablet by mouth daily. Centrum Silver     OVER THE COUNTER MEDICATION OTC Flonase PRN     No current facility-administered medications for this visit.    Allergies as of 01/14/2023 - Review Complete 01/06/2023  Allergen Reaction Noted   Propofol Shortness Of Breath 04/20/2015   Zoloft [sertraline hcl] Other (See Comments) 02/11/2013   Quinolones  08/07/2021    Family History  Problem Relation Age of Onset   Arthritis Mother     Depression Mother    Migraines Mother    Cancer Father        prostate   Arthritis Father    Ulcers Father    Heart attack Father    Colon cancer Maternal Uncle 59    Social History   Socioeconomic History   Marital status: Married    Spouse name: Not on file   Number of children: Not on file   Years of education: Not on file   Highest education level: Not on file  Occupational History   Not on file  Tobacco Use   Smoking status: Never   Smokeless tobacco: Never  Substance and Sexual Activity   Alcohol use: Yes    Comment: socially   Drug use: No   Sexual activity: Not on file  Other Topics Concern   Not on file  Social History Narrative   ** Merged History Encounter **       Social Determinants of Health   Financial Resource Strain: Not on file  Food Insecurity: Not on file  Transportation Needs: Not on file  Physical Activity: Not on file  Stress: Not on file  Social Connections: Not on file  Intimate Partner Violence: Not on file    Review of Systems:    Constitutional: No weight loss, fever, chills, weakness or fatigue HEENT: Eyes: No change in vision               Ears, Nose, Throat:  No change in hearing or congestion Skin: No rash or itching Cardiovascular: No chest pain, chest pressure or palpitations   Respiratory: No SOB or cough Gastrointestinal: See HPI and otherwise negative Genitourinary: No dysuria or change in urinary frequency Neurological: No headache, dizziness or syncope Musculoskeletal: No new muscle or joint pain Hematologic: No bleeding or bruising Psychiatric: No history of depression or anxiety    Physical Exam:  Vital signs: There were no vitals taken for this visit.  Constitutional: Well-developed, well-nourished, obviously jaundiced  head:  Normocephalic and atraumatic. Eyes:   PEERL, EOMI. icterus sclera. Conjunctiva pink. Respiratory: Respirations even and unlabored. Lungs clear to auscultation bilaterally.   No wheezes,  crackles, or rhonchi.  Cardiovascular:  Regular rate and rhythm. No peripheral edema, cyanosis or pallor.  Gastrointestinal:  Soft, nondistended, nontender. No rebound or guarding. Normal bowel sounds. No appreciable masses or hepatomegaly. Rectal:  Not performed.  Msk:  Symmetrical without gross deformities. Without edema, no deformity or joint abnormality.  Neurologic:  Alert and  oriented x4;  grossly normal neurologically.  Skin:   Dry and intact without significant lesions or rashes. Psychiatric: Oriented to person, place and time. Demonstrates good judgement and reason without abnormal  affect or behaviors.   RELEVANT LABS AND IMAGING: CBC    Component Value Date/Time   WBC 5.9 01/13/2023 1600   RBC 4.91 01/13/2023 1600   HGB 14.4 01/13/2023 1600   HCT 44.3 01/13/2023 1600   PLT 284 01/13/2023 1600   MCV 90.2 01/13/2023 1600   MCH 29.3 01/13/2023 1600   MCHC 32.5 01/13/2023 1600   RDW 13.4 01/13/2023 1600   LYMPHSABS 1,369 01/13/2023 1600   MONOABS 399 07/17/2016 1726   EOSABS 289 01/13/2023 1600   BASOSABS 53 01/13/2023 1600    CMP     Component Value Date/Time   NA 139 01/13/2023 1600   NA 141 09/17/2019 0736   K 4.0 01/13/2023 1600   CL 101 01/13/2023 1600   CO2 28 01/13/2023 1600   GLUCOSE 98 01/13/2023 1600   BUN 12 01/13/2023 1600   BUN 15 09/17/2019 0736   CREATININE 1.21 01/13/2023 1600   CALCIUM 9.4 01/13/2023 1600   PROT 6.5 01/13/2023 1600   ALBUMIN 4.1 07/17/2016 1726   AST 267 (H) 01/13/2023 1600   ALT 382 (H) 01/13/2023 1600   ALKPHOS 67 07/17/2016 1726   BILITOT 7.4 (H) 01/13/2023 1600   GFRNONAA 84 07/04/2020 0844   GFRAA 97 07/04/2020 0844     Assessment/Plan:   62 year old male with no significant past medical history presenting with dull constant epigastric pain, unintentional weight loss of 15 pounds over the course of 6 months, elevated LFTs over the last 2 weeks in what appears to be an obstructive pattern and Ct ab/pelvis with  contrast that was negative  Elevated LFTs Epigastric abdominal pain Abnormal loss of weight Negative hepatitis panel.  Patient is not immune to hepatitis B.  No previous episodes of elevated LFTs.  Ultrasound done in July for abdominal pain showed gallbladder polyp though this is not mentioned on CT scan done 9/27.  A CT scan negative for CBD dilation, gallstones, pancreatic abnormality. - CBC, CMP, lipase, PT/INR, iron studies, ferritin, GGT - Ceruloplasmin, alpha-1 antitrypsin deficiency, HFE, IgG, ASMA, ANA - CA 19-9 - Stat MRCP for further evaluation - Will manage based on diagnostics above  Lara Mulch [facility] 01/14/2023, 12:22 PM  Cc: Adela Glimpse, NP

## 2023-01-14 NOTE — Patient Instructions (Signed)
Your provider has requested that you go to the basement level for lab work before leaving today. Press "B" on the elevator. The lab is located at the first door on the left as you exit the elevator.  You have been scheduled for an MRCP at [facility] on 01/15/2023. Your appointment time is 3:00pm. Please arrive to admitting (at main entrance of the hospital) 30 minutes prior to your appointment time for registration purposes. Please make certain not to have anything to eat or drink 6 hours prior to your test. In addition, if you have any metal in your body, have a pacemaker or defibrillator, please be sure to let your ordering physician know. This test typically takes 45 minutes to 1 hour to complete. Should you need to reschedule, please call [phone] to do so.  _______________________________________________________  If your blood pressure at your visit was 140/90 or greater, please contact your primary care physician to follow up on this.  _______________________________________________________  If you are age 49 or older, your body mass index should be between 23-30. Your Body mass index is 23.87 kg/m. If this is out of the aforementioned range listed, please consider follow up with your Primary Care Provider.  If you are age 16 or younger, your body mass index should be between 19-25. Your Body mass index is 23.87 kg/m. If this is out of the aformentioned range listed, please consider follow up with your Primary Care Provider.   ________________________________________________________  The [facility] providers would like to encourage you to use [facility] to communicate with providers for non-urgent requests or questions.  Due to long hold times on the telephone, sending your provider a message by [facility] may be a faster and more efficient way to get a response.  Please allow 48 business hours for a response.  Please remember that this is for non-urgent requests.   _______________________________________________________ It was a pleasure to see you today!  Thank you for trusting me with your gastrointestinal care!

## 2023-01-15 ENCOUNTER — Telehealth: Payer: Self-pay | Admitting: Gastroenterology

## 2023-01-15 ENCOUNTER — Ambulatory Visit (HOSPITAL_COMMUNITY)
Admission: RE | Admit: 2023-01-15 | Discharge: 2023-01-15 | Disposition: A | Discharge status: Home / Self Care | Payer: BC Managed Care – PPO | Source: Ambulatory Visit | Attending: Gastroenterology | Admitting: Gastroenterology

## 2023-01-15 ENCOUNTER — Other Ambulatory Visit: Payer: Self-pay | Admitting: Gastroenterology

## 2023-01-15 DIAGNOSIS — C259 Malignant neoplasm of pancreas, unspecified: Secondary | ICD-10-CM

## 2023-01-15 DIAGNOSIS — N281 Cyst of kidney, acquired: Secondary | ICD-10-CM | POA: Diagnosis not present

## 2023-01-15 DIAGNOSIS — R634 Abnormal weight loss: Secondary | ICD-10-CM | POA: Insufficient documentation

## 2023-01-15 DIAGNOSIS — R1013 Epigastric pain: Secondary | ICD-10-CM | POA: Insufficient documentation

## 2023-01-15 DIAGNOSIS — K828 Other specified diseases of gallbladder: Secondary | ICD-10-CM | POA: Diagnosis not present

## 2023-01-15 DIAGNOSIS — K8689 Other specified diseases of pancreas: Secondary | ICD-10-CM | POA: Diagnosis not present

## 2023-01-15 MED ORDER — GADOBUTROL 1 MMOL/ML IV SOLN
7.0000 mL | Freq: Once | INTRAVENOUS | Status: AC | PRN
  Administered 2023-01-15: 7 mL via INTRAVENOUS

## 2023-01-15 NOTE — Telephone Encounter (Signed)
MRCP reviewed with Dr Myrtie Neither (DOD today) as Boone Master, PA-C (ordering provider) and supervising physician, Dr Chales Abrahams are both out of the office at this time. Dr Chales Abrahams will return tomorrow morning and will address results at that time. Dr Myrtie Neither has reviewed case and while urgent, he does not think the patient would need hospitalization overnight while awaiting further review of testing tomorrow morning.  Dr Chales Abrahams has been advised of need for urgent review and note will be routed to him as well.  Dr Barron Alvine, I think office note from yesterday may have erroneously been routed to you. Dr Chales Abrahams should have been supervising physician for this encounter.

## 2023-01-15 NOTE — Telephone Encounter (Signed)
Inbound call from [facility] radiology calling with a call report from MRI results.

## 2023-01-15 NOTE — Telephone Encounter (Signed)
Boone Master, NP has reached out to me and states that she has seen MRCP and will contact patient this evening to discuss (his results are set to be available for mychart review at 5:08 pm today). Futher recommendations to be made tomorrow.

## 2023-01-16 ENCOUNTER — Inpatient Hospital Stay (HOSPITAL_COMMUNITY)
Admission: EM | Admit: 2023-01-16 | Discharge: 2023-01-19 | DRG: 435 | Disposition: A | Discharge status: Home / Self Care | Payer: BC Managed Care – PPO | Attending: Internal Medicine | Admitting: Internal Medicine

## 2023-01-16 ENCOUNTER — Encounter (HOSPITAL_COMMUNITY): Payer: Self-pay

## 2023-01-16 ENCOUNTER — Other Ambulatory Visit: Payer: Self-pay

## 2023-01-16 ENCOUNTER — Other Ambulatory Visit: Payer: Self-pay | Admitting: *Deleted

## 2023-01-16 ENCOUNTER — Telehealth: Payer: Self-pay | Admitting: *Deleted

## 2023-01-16 ENCOUNTER — Encounter: Payer: Self-pay | Admitting: *Deleted

## 2023-01-16 DIAGNOSIS — K219 Gastro-esophageal reflux disease without esophagitis: Secondary | ICD-10-CM | POA: Diagnosis not present

## 2023-01-16 DIAGNOSIS — R17 Unspecified jaundice: Secondary | ICD-10-CM | POA: Diagnosis not present

## 2023-01-16 DIAGNOSIS — Q2381 Bicuspid aortic valve: Secondary | ICD-10-CM

## 2023-01-16 DIAGNOSIS — Z8261 Family history of arthritis: Secondary | ICD-10-CM | POA: Diagnosis not present

## 2023-01-16 DIAGNOSIS — R935 Abnormal findings on diagnostic imaging of other abdominal regions, including retroperitoneum: Secondary | ICD-10-CM | POA: Diagnosis not present

## 2023-01-16 DIAGNOSIS — E7800 Pure hypercholesterolemia, unspecified: Secondary | ICD-10-CM | POA: Diagnosis not present

## 2023-01-16 DIAGNOSIS — I10 Essential (primary) hypertension: Secondary | ICD-10-CM | POA: Diagnosis not present

## 2023-01-16 DIAGNOSIS — C259 Malignant neoplasm of pancreas, unspecified: Secondary | ICD-10-CM

## 2023-01-16 DIAGNOSIS — Z79890 Hormone replacement therapy (postmenopausal): Secondary | ICD-10-CM

## 2023-01-16 DIAGNOSIS — Z79899 Other long term (current) drug therapy: Secondary | ICD-10-CM | POA: Diagnosis not present

## 2023-01-16 DIAGNOSIS — K828 Other specified diseases of gallbladder: Secondary | ICD-10-CM | POA: Diagnosis not present

## 2023-01-16 DIAGNOSIS — E039 Hypothyroidism, unspecified: Secondary | ICD-10-CM | POA: Diagnosis not present

## 2023-01-16 DIAGNOSIS — Z888 Allergy status to other drugs, medicaments and biological substances status: Secondary | ICD-10-CM | POA: Diagnosis not present

## 2023-01-16 DIAGNOSIS — R011 Cardiac murmur, unspecified: Secondary | ICD-10-CM | POA: Diagnosis present

## 2023-01-16 DIAGNOSIS — Z8614 Personal history of Methicillin resistant Staphylococcus aureus infection: Secondary | ICD-10-CM

## 2023-01-16 DIAGNOSIS — Z8249 Family history of ischemic heart disease and other diseases of the circulatory system: Secondary | ICD-10-CM | POA: Diagnosis not present

## 2023-01-16 DIAGNOSIS — K8689 Other specified diseases of pancreas: Principal | ICD-10-CM

## 2023-01-16 DIAGNOSIS — C801 Malignant (primary) neoplasm, unspecified: Secondary | ICD-10-CM | POA: Diagnosis not present

## 2023-01-16 DIAGNOSIS — R739 Hyperglycemia, unspecified: Secondary | ICD-10-CM

## 2023-01-16 DIAGNOSIS — K831 Obstruction of bile duct: Secondary | ICD-10-CM | POA: Diagnosis not present

## 2023-01-16 DIAGNOSIS — E782 Mixed hyperlipidemia: Secondary | ICD-10-CM | POA: Diagnosis present

## 2023-01-16 DIAGNOSIS — Z818 Family history of other mental and behavioral disorders: Secondary | ICD-10-CM

## 2023-01-16 DIAGNOSIS — E559 Vitamin D deficiency, unspecified: Secondary | ICD-10-CM | POA: Diagnosis not present

## 2023-01-16 DIAGNOSIS — R7401 Elevation of levels of liver transaminase levels: Secondary | ICD-10-CM | POA: Diagnosis present

## 2023-01-16 DIAGNOSIS — Z4682 Encounter for fitting and adjustment of non-vascular catheter: Secondary | ICD-10-CM | POA: Diagnosis not present

## 2023-01-16 DIAGNOSIS — Z800 Family history of malignant neoplasm of digestive organs: Secondary | ICD-10-CM | POA: Diagnosis not present

## 2023-01-16 LAB — CBC
HCT: 43.5 % (ref 39.0–52.0)
Hemoglobin: 14.2 g/dL (ref 13.0–17.0)
MCH: 29.7 pg (ref 26.0–34.0)
MCHC: 32.6 g/dL (ref 30.0–36.0)
MCV: 91 fL (ref 80.0–100.0)
Platelets: 265 10*3/uL (ref 150–400)
RBC: 4.78 MIL/uL (ref 4.22–5.81)
RDW: 14.9 % (ref 11.5–15.5)
WBC: 7 10*3/uL (ref 4.0–10.5)
nRBC: 0 % (ref 0.0–0.2)

## 2023-01-16 LAB — URINALYSIS, ROUTINE W REFLEX MICROSCOPIC
Bacteria, UA: NONE SEEN
Bilirubin Urine: NEGATIVE
Glucose, UA: NEGATIVE mg/dL
Ketones, ur: NEGATIVE mg/dL
Leukocytes,Ua: NEGATIVE
Nitrite: NEGATIVE
Protein, ur: NEGATIVE mg/dL
Specific Gravity, Urine: 1.004 — ABNORMAL LOW (ref 1.005–1.030)
pH: 6 (ref 5.0–8.0)

## 2023-01-16 LAB — COMPREHENSIVE METABOLIC PANEL
ALT: 465 U/L — ABNORMAL HIGH (ref 0–44)
AST: 343 U/L — ABNORMAL HIGH (ref 15–41)
Albumin: 3.3 g/dL — ABNORMAL LOW (ref 3.5–5.0)
Alkaline Phosphatase: 745 U/L — ABNORMAL HIGH (ref 38–126)
Anion gap: 14 (ref 5–15)
BUN: 10 mg/dL (ref 8–23)
CO2: 22 mmol/L (ref 22–32)
Calcium: 9.6 mg/dL (ref 8.9–10.3)
Chloride: 103 mmol/L (ref 98–111)
Creatinine, Ser: 0.99 mg/dL (ref 0.61–1.24)
GFR, Estimated: >60 mL/min (ref >60)
Glucose, Bld: 110 mg/dL — ABNORMAL HIGH (ref 70–99)
Potassium: 3.4 mmol/L — ABNORMAL LOW (ref 3.5–5.1)
Sodium: 139 mmol/L (ref 135–145)
Total Bilirubin: 10.3 mg/dL — ABNORMAL HIGH (ref 0.3–1.2)
Total Protein: 6.9 g/dL (ref 6.5–8.1)

## 2023-01-16 LAB — LIPASE, BLOOD: Lipase: 26 U/L (ref 11–51)

## 2023-01-16 MED ORDER — MORPHINE SULFATE (PF) 4 MG/ML IV SOLN
4.0000 mg | Freq: Once | INTRAVENOUS | Status: AC
  Administered 2023-01-16: 4 mg via INTRAVENOUS
  Filled 2023-01-16: qty 1

## 2023-01-16 MED ORDER — ONDANSETRON HCL 4 MG/2ML IJ SOLN: 4.0000 mg | Freq: Four times a day (QID) | INTRAMUSCULAR | Status: DC | PRN

## 2023-01-16 MED ORDER — SENNOSIDES-DOCUSATE SODIUM 8.6-50 MG PO TABS: 1.0000 | ORAL_TABLET | Freq: Every evening | ORAL | Status: DC | PRN

## 2023-01-16 MED ORDER — HYDROMORPHONE HCL 1 MG/ML IJ SOLN
0.5000 mg | INTRAMUSCULAR | Status: DC | PRN
  Administered 2023-01-17 – 2023-01-18 (×2): 0.5 mg via INTRAVENOUS
  Filled 2023-01-16: qty 0.5
  Filled 2023-01-16: qty 1

## 2023-01-16 MED ORDER — HEPARIN SODIUM (PORCINE) 5000 UNIT/ML IJ SOLN
5000.0000 [IU] | Freq: Three times a day (TID) | INTRAMUSCULAR | Status: AC
  Administered 2023-01-17 (×3): 5000 [IU] via SUBCUTANEOUS
  Filled 2023-01-16 (×4): qty 1

## 2023-01-16 MED ORDER — ENALAPRIL MALEATE 10 MG PO TABS
10.0000 mg | ORAL_TABLET | Freq: Every day | ORAL | Status: DC
  Administered 2023-01-17 – 2023-01-18 (×3): 10 mg via ORAL
  Filled 2023-01-16 (×4): qty 1

## 2023-01-16 MED ORDER — POTASSIUM CHLORIDE IN NACL 20-0.9 MEQ/L-% IV SOLN
INTRAVENOUS | Status: DC
  Filled 2023-01-16 (×4): qty 1000

## 2023-01-16 MED ORDER — LEVOTHYROXINE SODIUM 75 MCG PO TABS
75.0000 ug | ORAL_TABLET | Freq: Every day | ORAL | Status: DC
  Administered 2023-01-17 – 2023-01-19 (×3): 75 ug via ORAL
  Filled 2023-01-16 (×3): qty 1

## 2023-01-16 MED ORDER — LACTATED RINGERS IV BOLUS
1000.0000 mL | Freq: Once | INTRAVENOUS | Status: AC
  Administered 2023-01-16: 1000 mL via INTRAVENOUS

## 2023-01-16 NOTE — Progress Notes (Signed)
I left HIPAA compliant message requesting return call.  I provided my direct call back number.

## 2023-01-16 NOTE — Progress Notes (Signed)
I spoke with Randy Allen.  I explained that we would not have any recommendations for treatment until after the biopsy confirms a malignancy.  Therefore we will schedule him after his ERCP.  He prefers to go to our [place] location as this is closer to his home and work.  I have messaged Marylouise Stacks and will have referral forwarded to [facility] location.  All questions were answered.  He verbalized understanding.

## 2023-01-16 NOTE — ED Provider Notes (Signed)
[place] [facility]MENT AT [facility] Provider Note   CSN: [id number] Arrival date & time: 01/16/23  1818     History  No chief complaint on file.   Randy Allen is a 62 y.o. male.  HPI 62 year old male history of hypertension, hyperlipidemia presenting for abdominal pain.  Patient states has had intermittent abdominal pain since June.  He saw his PCP who did some imaging and referred him to GI.  He saw [facility] October 1 who ordered an MRI for further evaluation.  He had an MRI done yesterday which showed hypoenhancing mass within the central pancreatic head which effaces the common bile duct and pancreatic ducts consistent pancreatic adenocarcinoma.  Mass also effect of the mesenteric vein.  He states he was told if his pain got worse to return to the ED.  His pain got worse today, no nausea or vomiting.  He is eating some.  Regular bowel movements.  He does feel more jaundiced than he has before.     Home Medications Prior to Admission medications   Medication Sig Start Date End Date Taking? Authorizing Provider  aspirin EC 81 MG tablet Take 81 mg by mouth at bedtime.   Yes [provider]  atorvastatin (LIPITOR) 40 MG tablet Take  1 tablet  Daily  for Cholesterol Patient taking differently: Take 20 mg by mouth at bedtime. 06/21/21  Yes Lucky Cowboy, MD  enalapril (VASOTEC) 10 MG tablet take 1 tablet by mouth daily for blood pressure Patient taking differently: Take 10 mg by mouth at bedtime. take 1 tablet by mouth daily for blood pressure 01/06/23  Yes Cranford, Archie Patten, NP  famotidine (PEPCID) 40 MG tablet take 1 tablet by mouth daily for heart burn and acid indigestion 10/07/22  Yes Raynelle Dick, NP  levothyroxine (SYNTHROID) 75 MCG tablet Take  1 tablet  Daily  on an empty stomach with only water for 30 minutes & no Antacid meds, Calcium or Magnesium for 4 hours & avoid Biotin Patient taking differently: 1.5 tablets by mouth Monday Wednesday  Friday. 1 tablet by mouth all other days 03/06/22  Yes Lucky Cowboy, MD  Multiple Vitamin (MULTIVITAMIN) tablet Take 1 tablet by mouth daily. Centrum Silver   Yes [provider]      Allergies    Propofol, Zoloft [sertraline hcl], and Quinolones    Review of Systems   Review of Systems Review of systems completed and notable as per HPI.  ROS otherwise negative.   Physical Exam Updated Vital Signs BP (!) 143/92 (BP Location: Right Arm)   Pulse 65   Temp 98.1 F (36.7 C) (Oral)   Resp 17   SpO2 98%  Physical Exam Vitals and nursing note reviewed.  Constitutional:      General: He is not in acute distress.    Appearance: He is well-developed.  HENT:     Head: Normocephalic and atraumatic.  Eyes:     General: Scleral icterus present.     Extraocular Movements: Extraocular movements intact.     Conjunctiva/sclera: Conjunctivae normal.     Pupils: Pupils are equal, round, and reactive to light.  Cardiovascular:     Rate and Rhythm: Normal rate and regular rhythm.     Heart sounds: No murmur heard. Pulmonary:     Effort: Pulmonary effort is normal. No respiratory distress.     Breath sounds: Normal breath sounds.  Abdominal:     Palpations: Abdomen is soft.     Tenderness: There  is abdominal tenderness. There is no guarding or rebound.  Musculoskeletal:        General: No swelling.     Cervical back: Neck supple.  Skin:    General: Skin is warm and dry.     Capillary Refill: Capillary refill takes less than 2 seconds.     Coloration: Skin is jaundiced.  Neurological:     Mental Status: He is alert.  Psychiatric:        Mood and Affect: Mood normal.     ED Results / Procedures / Treatments   Labs (all labs ordered are listed, but only abnormal results are displayed) Labs Reviewed  COMPREHENSIVE METABOLIC PANEL - Abnormal; Notable for the following components:      Result Value   Potassium 3.4 (*)    Glucose, Bld 110 (*)    Albumin 3.3 (*)    AST  343 (*)    ALT 465 (*)    Alkaline Phosphatase 745 (*)    Total Bilirubin 10.3 (*)    All other components within normal limits  URINALYSIS, ROUTINE W REFLEX MICROSCOPIC - Abnormal; Notable for the following components:   Color, Urine AMBER (*)    Specific Gravity, Urine 1.004 (*)    Hgb urine dipstick SMALL (*)    All other components within normal limits  LIPASE, BLOOD  CBC    EKG None  Radiology MR ABDOMEN MRCP W WO CONTAST  Result Date: 01/15/2023 CLINICAL DATA:  Epigastric pain, flank pain elevated LFTs, jaundice EXAM: MRI ABDOMEN WITHOUT AND WITH CONTRAST (INCLUDING MRCP) TECHNIQUE: Multiplanar multisequence MR imaging of the abdomen was performed both before and after the administration of intravenous contrast. Heavily T2-weighted images of the biliary and pancreatic ducts were obtained, and three-dimensional MRCP images were rendered by post processing. CONTRAST:  7mL GADAVIST GADOBUTROL 1 MMOL/ML IV SOLN COMPARISON:  CT abdomen pelvis, 01/10/2023 FINDINGS: Lower chest: No acute abnormality. Hepatobiliary: No solid liver abnormality is seen. Distended gallbladder containing sludge (series 3, image 7). Mild intra and extrahepatic biliary ductal dilatation, the common bile duct measuring up to 1.1 cm and abruptly truncated within the superior pancreatic head, although subtly reconstituted inferiorly within the pancreatic head (series 3, image 16, series 3, image 15). Pancreas: Hypoenhancing mass within the central pancreatic head, which effaces the common bile duct and pancreatic ducts, measuring 2.6 x 2.4 cm (series 16, image 48). The remainder of the pancreas is diffusely atrophic, with dilatation of the main pancreatic duct up to 0.6 cm (series 4, image 15). Mass contacts the adjacent central superior mesenteric vein and portal confluence with less than 180 degrees of contact (series 18, image 51). Preserved fat plane to the adjacent superior mesenteric artery. Spleen: Normal in size  without significant abnormality. Adrenals/Urinary Tract: Adrenal glands are unremarkable. Simple, benign bilateral renal cortical and parapelvic cysts, requiring no specific further follow-up or characterization. Kidneys are otherwise normal, without obvious renal calculi, solid lesion, or hydronephrosis. Stomach/Bowel: Stomach is within normal limits. No evidence of bowel wall thickening, distention, or inflammatory changes. Vascular/Lymphatic: No significant vascular findings are present. No enlarged abdominal lymph nodes. Other: No abdominal wall hernia or abnormality. No ascites. Musculoskeletal: No acute or significant osseous findings. IMPRESSION: 1. Hypoenhancing mass within the central pancreatic head, which effaces the common bile duct and pancreatic ducts, measuring 2.6 x 2.4 cm. 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 2. Mass contacts the adjacent central superior mesenteric vein and portal confluence with less than 180 degrees of contact. Preserved fat plane  to the adjacent superior mesenteric artery. 3. Distended gallbladder containing sludge. 4. No evidence of lymphadenopathy or metastatic disease in the abdomen. These results will be called to the ordering clinician or representative by the Radiologist Assistant, and communication documented in the PACS or [employer]. Electronically Signed   By: Jearld Lesch M.D.   On: 01/15/2023 16:06   MR 3D Recon At Scanner  Result Date: 01/15/2023 CLINICAL DATA:  Epigastric pain, flank pain elevated LFTs, jaundice EXAM: MRI ABDOMEN WITHOUT AND WITH CONTRAST (INCLUDING MRCP) TECHNIQUE: Multiplanar multisequence MR imaging of the abdomen was performed both before and after the administration of intravenous contrast. Heavily T2-weighted images of the biliary and pancreatic ducts were obtained, and three-dimensional MRCP images were rendered by post processing. CONTRAST:  7mL GADAVIST GADOBUTROL 1 MMOL/ML IV SOLN COMPARISON:  CT abdomen  pelvis, 01/10/2023 FINDINGS: Lower chest: No acute abnormality. Hepatobiliary: No solid liver abnormality is seen. Distended gallbladder containing sludge (series 3, image 7). Mild intra and extrahepatic biliary ductal dilatation, the common bile duct measuring up to 1.1 cm and abruptly truncated within the superior pancreatic head, although subtly reconstituted inferiorly within the pancreatic head (series 3, image 16, series 3, image 15). Pancreas: Hypoenhancing mass within the central pancreatic head, which effaces the common bile duct and pancreatic ducts, measuring 2.6 x 2.4 cm (series 16, image 48). The remainder of the pancreas is diffusely atrophic, with dilatation of the main pancreatic duct up to 0.6 cm (series 4, image 15). Mass contacts the adjacent central superior mesenteric vein and portal confluence with less than 180 degrees of contact (series 18, image 51). Preserved fat plane to the adjacent superior mesenteric artery. Spleen: Normal in size without significant abnormality. Adrenals/Urinary Tract: Adrenal glands are unremarkable. Simple, benign bilateral renal cortical and parapelvic cysts, requiring no specific further follow-up or characterization. Kidneys are otherwise normal, without obvious renal calculi, solid lesion, or hydronephrosis. Stomach/Bowel: Stomach is within normal limits. No evidence of bowel wall thickening, distention, or inflammatory changes. Vascular/Lymphatic: No significant vascular findings are present. No enlarged abdominal lymph nodes. Other: No abdominal wall hernia or abnormality. No ascites. Musculoskeletal: No acute or significant osseous findings. IMPRESSION: 1. Hypoenhancing mass within the central pancreatic head, which effaces the common bile duct and pancreatic ducts, measuring 2.6 x 2.4 cm. 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 2. Mass contacts the adjacent central superior mesenteric vein and portal confluence with less than 180 degrees of  contact. Preserved fat plane to the adjacent superior mesenteric artery. 3. Distended gallbladder containing sludge. 4. No evidence of lymphadenopathy or metastatic disease in the abdomen. These results will be called to the ordering clinician or representative by the Radiologist Assistant, and communication documented in the PACS or [employer]. Electronically Signed   By: Jearld Lesch M.D.   On: 01/15/2023 16:06    Procedures Procedures    Medications Ordered in ED Medications  ondansetron (ZOFRAN) injection 4 mg (has no administration in time range)  HYDROmorphone (DILAUDID) injection 0.5 mg (has no administration in time range)  0.9 % NaCl with KCl 20 mEq/ L  infusion (has no administration in time range)  lactated ringers bolus 1,000 mL (1,000 mLs Intravenous New Bag/Given 01/16/23 2135)  morphine (PF) 4 MG/ML injection 4 mg (4 mg Intravenous Given 01/16/23 2135)    ED Course/ Medical Decision Making/ A&P Clinical Course as of 01/16/23 2240  Thu Jan 16, 2023  2[facility]: admit,  [JD]    Clinical Course User Index [JD] Fulton Reek  Rexene Edison, MD                                 Medical Decision Making Amount and/or Complexity of Data Reviewed Labs: ordered.  Risk Prescription drug management. Decision regarding hospitalization.   Medical Decision Making:   GHALI MORISSETTE is a 62 y.o. male who presented to the ED today with worsening abdominal pain and jaundice.  I reviewed his MRI from yesterday, notable for mass in the pancreas.  Presents for worsening jaundice and abdominal pain.  No vomiting, he is tolerating p.o.  Mild tenderness on exam periumbilical but no peritonitis.  I suspect his pain is due to the known mass.  Repeat lab work here shows stable transaminitis, mildly worsened hyperbilirubinemia.  Lipase, CBC are unremarkable.  Urinalysis without signs of infection.  Given pain control.  I discussed with Dr. Chales Abrahams with gastroenterology who is familiar with the patient,  he recommends admission to medicine, n.p.o. at midnight and they will possibly move up his ERCP for tomorrow.  No repeat imaging indicated at this time given he had imaging yesterday.  Discussed with patient and admitted.   Patient placed on continuous vitals and telemetry monitoring while in ED which was reviewed periodically.  Reviewed and confirmed nursing documentation for past medical history, family history, social history.   Patient's presentation is most consistent with acute complicated illness / injury requiring diagnostic workup.           Final Clinical Impression(s) / ED Diagnoses Final diagnoses:  Pancreatic mass    Rx / DC Orders ED Discharge Orders     None         Laurence Spates, MD 01/16/23 2240

## 2023-01-16 NOTE — H&P (Signed)
PCP:   Lucky Cowboy, MD   Chief Complaint:  Abdominal pain  HPI: This is a pleasant but unfortunate male with past medical history of heart murmur, bicuspid heart valve, HLD, HTN, hypothyroidism.  He has been having abdominal pain since September 15.  Did notice his urine was dark in the stools light.  He had the MRI abdomen pelvis done yesterday which shows a pancreatic head mass and common bile duct obstructions.  He was instructed that if his pain worsen or if he develops flank pain he should go to the ER.  His abdominal pain worsen, he presented to the ER.  In the ER patient AST 334, ALT 465, T. bili 10.3.  Elevated LFTs are not new as of labs on 01/06/2023.  Patient's T. bili progressively increased from 5.1-10.3.  INR 1.2.  GI has been consulted by EDP.  Will see patient in a.m. for ERCP.  Review of Systems:  Per HPI  Past Medical History: Past Medical History:  Diagnosis Date   Dental crowns present    Heart murmur    states need for antibiotics prior to dental procedures; no cardiologist   High blood cholesterol    History of hepatitis A    age 62   History of MRSA infection 04/15/2013   right great toe   Hyperlipidemia    Hypertension    states under control with med., has been on med. > 10 yr.   Hypothyroidism    Mass of skin of right shoulder 04/16/2015   Neck mass 04/16/2015   posterior   Seasonal allergies    Vitamin D deficiency    Past Surgical History:  Procedure Laterality Date   COLONOSCOPY WITH PROPOFOL  10/21/2013   EYE SURGERY Right 1966   HYDROCELE EXCISION     MASS EXCISION Right 04/26/2015   Procedure: EXCISION MASS NECK AND RIGHT SHOULDER ;  Surgeon: Luretha Murphy, MD;  Location: [facility];  Service: General;  Laterality: Right;    Medications: Prior to Admission medications   Medication Sig Start Date End Date Taking? Authorizing Provider  aspirin EC 81 MG tablet Take 81 mg by mouth at bedtime.   Yes [provider]   atorvastatin (LIPITOR) 40 MG tablet Take  1 tablet  Daily  for Cholesterol Patient taking differently: Take 20 mg by mouth at bedtime. 06/21/21  Yes Lucky Cowboy, MD  enalapril (VASOTEC) 10 MG tablet take 1 tablet by mouth daily for blood pressure Patient taking differently: Take 10 mg by mouth at bedtime. take 1 tablet by mouth daily for blood pressure 01/06/23  Yes Cranford, Archie Patten, NP  famotidine (PEPCID) 40 MG tablet take 1 tablet by mouth daily for heart burn and acid indigestion 10/07/22  Yes Raynelle Dick, NP  levothyroxine (SYNTHROID) 75 MCG tablet Take  1 tablet  Daily  on an empty stomach with only water for 30 minutes & no Antacid meds, Calcium or Magnesium for 4 hours & avoid Biotin Patient taking differently: 1.5 tablets by mouth Monday Wednesday Friday. 1 tablet by mouth all other days 03/06/22  Yes Lucky Cowboy, MD  Multiple Vitamin (MULTIVITAMIN) tablet Take 1 tablet by mouth daily. Centrum Silver   Yes [provider]    Allergies:   Allergies  Allergen Reactions   Propofol Shortness Of Breath    CHEST TIGHTNESS _Patient stated he was told this medication was "pushed to fast" and that is what caused his symptoms   Zoloft [Sertraline Hcl] Other (See Comments)  HYPERACTIVE   Quinolones     Aortic aneurysm    Social History:  reports that he has never smoked. He has never used smokeless tobacco. He reports current alcohol use. He reports that he does not use drugs.  Family History: Family History  Problem Relation Age of Onset   Arthritis Mother    Depression Mother    Migraines Mother    Cancer Father        prostate   Arthritis Father    Ulcers Father    Heart attack Father    Colon cancer Maternal Uncle 62    Physical Exam: Vitals:   01/16/23 1821 01/16/23 2128  BP: (!) 149/100 (!) 143/92  Pulse: 71 65  Resp: 16 17  Temp: 98 F (36.7 C) 98.1 F (36.7 C)  TempSrc: Oral Oral  SpO2: 98% 98%    General: A and O x 3, well developed  and nourished, no acute distress Eyes: Positive scleral icterus ENT: Moist oral mucosa, neck supple, no thyromegaly Lungs: CTA B/L,  no use of accessory muscles Cardiovascular: RRR, ++ murmur across torso Abdomen: soft, positive BS, epigastric fullness but no TTP GU: not examined Neuro: CN II - XII grossly intact, sensation intact Musculoskeletal: strength 5/5 all extremities, no clubbing, cyanosis or edema Skin: Jaundice Psych: appropriate patient  Labs on Admission:  Recent Labs    01/14/23 1550 01/16/23 1827  NA 139 139  K 3.6 3.4*  CL 100 103  CO2 31 22  GLUCOSE 118* 110*  BUN 11 10  CREATININE 1.15 0.99  CALCIUM 9.8 9.6   Recent Labs    01/14/23 1550 01/16/23 1827  AST 330* 343*  ALT 450* 465*  ALKPHOS 863* 745*  BILITOT 8.9* 10.3*  PROT 7.5 6.9  ALBUMIN 4.2 3.3*   Recent Labs    01/14/23 1550 01/16/23 1827  LIPASE 44.0 26   Recent Labs    01/14/23 1550 01/16/23 1827  WBC 5.3 7.0  NEUTROABS 3.2  --   HGB 15.0 14.2  HCT 45.1 43.5  MCV 89.2 91.0  PLT 279.0 265    Recent Labs    01/14/23 1550  FERRITIN 783.1*  TIBC 375.2  IRON 69    Micro Results:    Radiological Exams on Admission: MR ABDOMEN MRCP W WO CONTAST  Result Date: 01/15/2023 CLINICAL DATA:  Epigastric pain, flank pain elevated LFTs, jaundice EXAM: MRI ABDOMEN WITHOUT AND WITH CONTRAST (INCLUDING MRCP) TECHNIQUE: Multiplanar multisequence MR imaging of the abdomen was performed both before and after the administration of intravenous contrast. Heavily T2-weighted images of the biliary and pancreatic ducts were obtained, and three-dimensional MRCP images were rendered by post processing. CONTRAST:  7mL GADAVIST GADOBUTROL 1 MMOL/ML IV SOLN COMPARISON:  CT abdomen pelvis, 01/10/2023 FINDINGS: Lower chest: No acute abnormality. Hepatobiliary: No solid liver abnormality is seen. Distended gallbladder containing sludge (series 3, image 7). Mild intra and extrahepatic biliary ductal  dilatation, the common bile duct measuring up to 1.1 cm and abruptly truncated within the superior pancreatic head, although subtly reconstituted inferiorly within the pancreatic head (series 3, image 16, series 3, image 15). Pancreas: Hypoenhancing mass within the central pancreatic head, which effaces the common bile duct and pancreatic ducts, measuring 2.6 x 2.4 cm (series 16, image 48). The remainder of the pancreas is diffusely atrophic, with dilatation of the main pancreatic duct up to 0.6 cm (series 4, image 15). Mass contacts the adjacent central superior mesenteric vein and portal confluence with less than 180 degrees  of contact (series 18, image 51). Preserved fat plane to the adjacent superior mesenteric artery. Spleen: Normal in size without significant abnormality. Adrenals/Urinary Tract: Adrenal glands are unremarkable. Simple, benign bilateral renal cortical and parapelvic cysts, requiring no specific further follow-up or characterization. Kidneys are otherwise normal, without obvious renal calculi, solid lesion, or hydronephrosis. Stomach/Bowel: Stomach is within normal limits. No evidence of bowel wall thickening, distention, or inflammatory changes. Vascular/Lymphatic: No significant vascular findings are present. No enlarged abdominal lymph nodes. Other: No abdominal wall hernia or abnormality. No ascites. Musculoskeletal: No acute or significant osseous findings. IMPRESSION: 1. Hypoenhancing mass within the central pancreatic head, which effaces the common bile duct and pancreatic ducts, measuring 2.6 x 2.4 cm. 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 2. Mass contacts the adjacent central superior mesenteric vein and portal confluence with less than 180 degrees of contact. Preserved fat plane to the adjacent superior mesenteric artery. 3. Distended gallbladder containing sludge. 4. No evidence of lymphadenopathy or metastatic disease in the abdomen. These results will be called to  the ordering clinician or representative by the Radiologist Assistant, and communication documented in the PACS or [employer]. Electronically Signed   By: Jearld Lesch M.D.   On: 01/15/2023 16:06   MR 3D Recon At Scanner  Result Date: 01/15/2023 CLINICAL DATA:  Epigastric pain, flank pain elevated LFTs, jaundice EXAM: MRI ABDOMEN WITHOUT AND WITH CONTRAST (INCLUDING MRCP) TECHNIQUE: Multiplanar multisequence MR imaging of the abdomen was performed both before and after the administration of intravenous contrast. Heavily T2-weighted images of the biliary and pancreatic ducts were obtained, and three-dimensional MRCP images were rendered by post processing. CONTRAST:  7mL GADAVIST GADOBUTROL 1 MMOL/ML IV SOLN COMPARISON:  CT abdomen pelvis, 01/10/2023 FINDINGS: Lower chest: No acute abnormality. Hepatobiliary: No solid liver abnormality is seen. Distended gallbladder containing sludge (series 3, image 7). Mild intra and extrahepatic biliary ductal dilatation, the common bile duct measuring up to 1.1 cm and abruptly truncated within the superior pancreatic head, although subtly reconstituted inferiorly within the pancreatic head (series 3, image 16, series 3, image 15). Pancreas: Hypoenhancing mass within the central pancreatic head, which effaces the common bile duct and pancreatic ducts, measuring 2.6 x 2.4 cm (series 16, image 48). The remainder of the pancreas is diffusely atrophic, with dilatation of the main pancreatic duct up to 0.6 cm (series 4, image 15). Mass contacts the adjacent central superior mesenteric vein and portal confluence with less than 180 degrees of contact (series 18, image 51). Preserved fat plane to the adjacent superior mesenteric artery. Spleen: Normal in size without significant abnormality. Adrenals/Urinary Tract: Adrenal glands are unremarkable. Simple, benign bilateral renal cortical and parapelvic cysts, requiring no specific further follow-up or characterization. Kidneys  are otherwise normal, without obvious renal calculi, solid lesion, or hydronephrosis. Stomach/Bowel: Stomach is within normal limits. No evidence of bowel wall thickening, distention, or inflammatory changes. Vascular/Lymphatic: No significant vascular findings are present. No enlarged abdominal lymph nodes. Other: No abdominal wall hernia or abnormality. No ascites. Musculoskeletal: No acute or significant osseous findings. IMPRESSION: 1. Hypoenhancing mass within the central pancreatic head, which effaces the common bile duct and pancreatic ducts, measuring 2.6 x 2.4 cm. 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 2. Mass contacts the adjacent central superior mesenteric vein and portal confluence with less than 180 degrees of contact. Preserved fat plane to the adjacent superior mesenteric artery. 3. Distended gallbladder containing sludge. 4. No evidence of lymphadenopathy or metastatic disease in the abdomen. These results will be called to  the ordering clinician or representative by the Radiologist Assistant, and communication documented in the PACS or [employer]. Electronically Signed   By: Jearld Lesch M.D.   On: 01/15/2023 16:06    Assessment/Plan Present on Admission:  CBD obstruction //  Jaundice -GI consulted.  N.p.o. at midnight -IV fluid hydration -Likely ERCP in a.m. -CMP, ammonia, INR levels in a.m.   Pancreatic head Mass -likely pancreatic cancer   Elevated LFTs -Likely secondary to above -Avoid nephrotoxic medications   Hyperlipidemia -Statin on hold given elevated LFTs   Hypothyroidism -Synthroid resumed   HTN -Enalapril resumed   Congenital bicuspid valve   Congenital murmur  Kaveri Perras 01/16/2023, 10:06 PM

## 2023-01-16 NOTE — Progress Notes (Signed)
PATIENT NAVIGATOR PROGRESS NOTE  Name: Randy Allen Date: 01/16/2023 MRN: [id number]  DOB: October 24, 1960   Reason for visit:  New patient appt  Comments:  Spoke with patient and have scheduled him with Dr Truett Perna on 01/27/23 for new patient appt.  He is having ERCP with bx and stenting on 10/10  Reviewed directions to building and parking as well as one support person in exam room. Verbalized understanding.     Time spent counseling/coordinating care: 45-60 minutes

## 2023-01-16 NOTE — Telephone Encounter (Signed)
See MRCP result note dated 01/15/23 for additional information.

## 2023-01-16 NOTE — Telephone Encounter (Signed)
===  View-only below this line=== ----- Message ----- From: [name] Sent: 01/16/2023   8:15 AM EDT To: Richardson Chiquito, RN  Informed patient. ERCP October 10th, labs Monday, set up with oncology. Thank you. ED

## 2023-01-16 NOTE — ED Triage Notes (Signed)
Pt c/o abd and bilateral flank pain x 1 month, worsening ; had MRI Tuesday, was told he has pancreatic cancer; ERCP scheduled for 1 week from today with Dr. Chales Abrahams; denies N/V, denies fevers, endorses dark urine and light stool

## 2023-01-17 DIAGNOSIS — R17 Unspecified jaundice: Secondary | ICD-10-CM | POA: Diagnosis not present

## 2023-01-17 DIAGNOSIS — K8689 Other specified diseases of pancreas: Secondary | ICD-10-CM | POA: Diagnosis not present

## 2023-01-17 DIAGNOSIS — K831 Obstruction of bile duct: Secondary | ICD-10-CM | POA: Diagnosis not present

## 2023-01-17 LAB — COMPREHENSIVE METABOLIC PANEL
ALT: 372 U/L — ABNORMAL HIGH (ref 0–44)
AST: 293 U/L — ABNORMAL HIGH (ref 15–41)
Albumin: 2.8 g/dL — ABNORMAL LOW (ref 3.5–5.0)
Alkaline Phosphatase: 608 U/L — ABNORMAL HIGH (ref 38–126)
Anion gap: 9 (ref 5–15)
BUN: 9 mg/dL (ref 8–23)
CO2: 24 mmol/L (ref 22–32)
Calcium: 8.8 mg/dL — ABNORMAL LOW (ref 8.9–10.3)
Chloride: 104 mmol/L (ref 98–111)
Creatinine, Ser: 1.04 mg/dL (ref 0.61–1.24)
GFR, Estimated: >60 mL/min (ref >60)
Glucose, Bld: 104 mg/dL — ABNORMAL HIGH (ref 70–99)
Potassium: 3.6 mmol/L (ref 3.5–5.1)
Sodium: 137 mmol/L (ref 135–145)
Total Bilirubin: 8.9 mg/dL — ABNORMAL HIGH (ref 0.3–1.2)
Total Protein: 5.9 g/dL — ABNORMAL LOW (ref 6.5–8.1)

## 2023-01-17 LAB — PROTIME-INR
INR: 1.1 (ref 0.8–1.2)
Prothrombin Time: 14.4 s (ref 11.4–15.2)

## 2023-01-17 LAB — AMMONIA: Ammonia: 40 umol/L — ABNORMAL HIGH (ref 9–35)

## 2023-01-17 MED ORDER — LACTULOSE 10 GM/15ML PO SOLN: 10.0000 g | Freq: Every day | ORAL | Status: DC | PRN

## 2023-01-17 MED ORDER — OXYCODONE HCL 5 MG PO TABS
5.0000 mg | ORAL_TABLET | Freq: Four times a day (QID) | ORAL | Status: DC | PRN
  Administered 2023-01-17 – 2023-01-18 (×2): 5 mg via ORAL
  Filled 2023-01-17 (×2): qty 1

## 2023-01-17 NOTE — Consult Note (Addendum)
Attending physician's note   I have taken a history, reviewed the chart, and examined the patient. I performed a substantive portion of this encounter, including complete performance of at least one of the key components, in conjunction with the APP. I agree with the APP's note, impression, and recommendations with my edits.   62 year old male with medical history as outlined below, seen earlier this week in the GI clinic for evaluation of elevated liver enzymes, epigastric pain, unintentional weight loss.  He states he started having abdominal pain in June, which abated for the most part through the summer and started again in September.  Has lost approximately 15#.  Started noticing darkening urine and epigastric pain over the last 2 weeks or so, then friends noticed jaundiced complexion last week.  Recent evaluation notable for the following: - 01/15/2023: MRI/MRCP 2.6 x 2.4 cm hypoenhancing mass in the central pancreatic head which effaces the CBD and PD.  Mass contacts the adjacent central SMV and portal confluence with less than 180 degrees of contact.  Preserved fat plane to the adjacent SMA.  CBD dilation up to 1.1 cm and abruptly truncated within the superior pancreatic head.  No lymphadenopathy - 01/17/2023: AST/ALT 293/372, ALP 608, T. bili 8.9 - Normal PT/INR, CBC, lipase  1) Pancreatic mass 2) Obstructive jaundice 3) Unintentional weight loss 4) Elevated liver enzymes 5) Epigastric pain I discussed the radiographic findings, labs, and clinical presentation with the patient and family members at bedside at length today.  We discussed plan for ERCP for bile duct decompression to start, and will likely need EUS to follow in the near future.  - Plan for ERCP with Dr. Marina Allen tomorrow for biliary decompression and possible bile duct brushing - Diet okay today with n.p.o. at midnight - Already has appointment in the Oncology Clinic on 10/14 - Repeat liver enzymes in the morning - Hold  heparin prophy this evening and tomorrow  The indications, risks, and benefits of ERCP were explained to the patient in detail. Risks include but are not limited to bleeding, perforation, adverse reaction to medications, pancreatitis, inability to cannulate duct, and cardiopulmonary compromise. Sequelae include but are not limited to the possibility of surgery, prolonged hospitalization, and mortality. The patient verbalized understanding and wishes to proceed. All questions answered to the Randy Allen of my ability.    [address], DO, [place] [phone] office                                                     Consultation Note   Referring Provider:  [facility] PCP: Randy Cowboy, MD Primary Gastroenterologist: Randy Locks, MD     Reason for Consultation: pancreatic mass / obstructive jaundice.   DOA: 01/16/2023         Hospital Day: 2   ASSESSMENT    Brief Narrative:  62 y.o. year old male with a history of HTN, HL, GERD, hypothyroidism bicuspid AV, ascending aortic aneurysm. Recently diagnosed pancreatic mass after presenting with obstructive jaundice and abdominal pain. Was to get ERCP but came to ED with worsening pain.   Obstructive jaundice / pancreatic head mass   MRCP >Hypoenhancing mass within the central pancreatic head measuring 2.6 x 2.4 cm.. Mass contacts the adjacent central superior mesenteric vein and portal confluence with less than 180 degrees of contact. Tbili 8.9  PLAN:   --Probable decompressive ERCP tomorrow. Will discuss with our biliary endoscopist --He is schedule to see Oncology on 10/14 --Ok to eat today, will order diet   HPI   Randy Allen was seen in our office on 01/14/23 for elevated LFTs ( mixed pattern) and epigastric pain  as well as unintentional weight loss. CT scan was unrevealing in late Sept. We ordered MRCP which showed a pancreatic head mass. He is scheduled for ERCP in a few days but came to ED with worsening abdominal pain  . Having periumbilical pain which is almost constant. Has intermittent mid back as well. No nausea / vomiting. Endorses dark urine, pale stools. No itching.   Labs  CBC normal Alk phos 608 AST 293 / ALT 272 Tbili 8.9 INR 1/1   MRI / MRCP 1. Hypoenhancing mass within the central pancreatic head, which effaces the common bile duct and pancreatic ducts, measuring 2.6 x 2.4 cm. 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 2. Mass contacts the adjacent central superior mesenteric vein and portal confluence with less than 180 degrees of contact. Preserved fat plane to the adjacent superior mesenteric artery. 3. Distended gallbladder containing sludge. 4. No evidence of lymphadenopathy or metastatic disease in the abdomen.  Previous GI Evaluations  Colonoscopy July 2015 Sigmoid diverticulosis, o/w normal   Labs and Imaging: Recent Labs    01/14/23 1550 01/16/23 1827  WBC 5.3 7.0  HGB 15.0 14.2  HCT 45.1 43.5  PLT 279.0 265   Recent Labs    01/14/23 1550 01/16/23 1827 01/17/23 0246  NA 139 139 137  K 3.6 3.4* 3.6  CL 100 103 104  CO2 31 22 24   GLUCOSE 118* 110* 104*  BUN 11 10 9   CREATININE 1.15 0.99 1.04  CALCIUM 9.8 9.6 8.8*   Recent Labs    01/17/23 0246  PROT 5.9*  ALBUMIN 2.8*  AST 293*  ALT 372*  ALKPHOS 608*  BILITOT 8.9*   No results for input(s): "HEPBSAG", "HCVAB", "HEPAIGM", "HEPBIGM" in the last 72 hours. Recent Labs    01/14/23 1550 01/17/23 0246  LABPROT 12.6 14.4  INR 1.2* 1.1      Past Medical History:  Diagnosis Date   Dental crowns present    Heart murmur    states need for antibiotics prior to dental procedures; no cardiologist   High blood cholesterol    History of hepatitis A    age 62   History of MRSA infection 04/15/2013   right great toe   Hyperlipidemia    Hypertension    states under control with med., has been on med. > 10 yr.   Hypothyroidism    Mass of skin of right shoulder 04/16/2015   Neck mass  04/16/2015   posterior   Seasonal allergies    Vitamin D deficiency     Past Surgical History:  Procedure Laterality Date   COLONOSCOPY WITH PROPOFOL  10/21/2013   EYE SURGERY Right 1966   HYDROCELE EXCISION     MASS EXCISION Right 04/26/2015   Procedure: EXCISION MASS NECK AND RIGHT SHOULDER ;  Surgeon: Luretha Murphy, MD;  Location: [facility];  Service: General;  Laterality: Right;    Family History  Problem Relation Age of Onset   Arthritis Mother    Depression Mother    Migraines Mother    Cancer Father        prostate   Arthritis Father    Ulcers Father    Heart attack Father  Colon cancer Maternal Uncle 62    Prior to Admission medications   Medication Sig Start Date End Date Taking? Authorizing Provider  aspirin EC 81 MG tablet Take 81 mg by mouth at bedtime.   Yes [provider]  atorvastatin (LIPITOR) 40 MG tablet Take  1 tablet  Daily  for Cholesterol Patient taking differently: Take 20 mg by mouth at bedtime. 06/21/21  Yes Randy Cowboy, MD  enalapril (VASOTEC) 10 MG tablet take 1 tablet by mouth daily for blood pressure Patient taking differently: Take 10 mg by mouth at bedtime. take 1 tablet by mouth daily for blood pressure 01/06/23  Yes Cranford, Archie Patten, NP  famotidine (PEPCID) 40 MG tablet take 1 tablet by mouth daily for heart burn and acid indigestion 10/07/22  Yes Raynelle Dick, NP  levothyroxine (SYNTHROID) 75 MCG tablet Take  1 tablet  Daily  on an empty stomach with only water for 30 minutes & no Antacid meds, Calcium or Magnesium for 4 hours & avoid Biotin Patient taking differently: 1.5 tablets by mouth Monday Wednesday Friday. 1 tablet by mouth all other days 03/06/22  Yes Randy Cowboy, MD  Multiple Vitamin (MULTIVITAMIN) tablet Take 1 tablet by mouth daily. Centrum Silver   Yes [provider]    Current Facility-Administered Medications  Medication Dose Route Frequency Provider Last Rate Last Admin   0.9 %  NaCl with KCl 20 mEq/ L  infusion   Intravenous Continuous Crosley, Debby, MD 75 mL/hr at 01/17/23 0839 Infusion Verify at 01/17/23 0839   enalapril (VASOTEC) tablet 10 mg  10 mg Oral QHS Crosley, Debby, MD   10 mg at 01/17/23 0012   heparin injection 5,000 Units  5,000 Units Subcutaneous Q8H Crosley, Debby, MD   5,000 Units at 01/17/23 0534   HYDROmorphone (DILAUDID) injection 0.5 mg  0.5 mg Intravenous Q3H PRN Crosley, Debby, MD   0.5 mg at 01/17/23 0540   levothyroxine (SYNTHROID) tablet 75 mcg  75 mcg Oral Q0600 Gery Pray, MD   75 mcg at 01/17/23 0533   ondansetron (ZOFRAN) injection 4 mg  4 mg Intravenous Q6H PRN Crosley, Debby, MD       senna-docusate (Senokot-S) tablet 1 tablet  1 tablet Oral QHS PRN Gery Pray, MD       Current Outpatient Medications  Medication Sig Dispense Refill   aspirin EC 81 MG tablet Take 81 mg by mouth at bedtime.     atorvastatin (LIPITOR) 40 MG tablet Take  1 tablet  Daily  for Cholesterol (Patient taking differently: Take 20 mg by mouth at bedtime.) 90 tablet 3   enalapril (VASOTEC) 10 MG tablet take 1 tablet by mouth daily for blood pressure (Patient taking differently: Take 10 mg by mouth at bedtime. take 1 tablet by mouth daily for blood pressure) 90 tablet 0   famotidine (PEPCID) 40 MG tablet take 1 tablet by mouth daily for heart burn and acid indigestion 90 tablet 0   levothyroxine (SYNTHROID) 75 MCG tablet Take  1 tablet  Daily  on an empty stomach with only water for 30 minutes & no Antacid meds, Calcium or Magnesium for 4 hours & avoid Biotin (Patient taking differently: 1.5 tablets by mouth Monday Wednesday Friday. 1 tablet by mouth all other days) 90 tablet 3   Multiple Vitamin (MULTIVITAMIN) tablet Take 1 tablet by mouth daily. Centrum Silver      Allergies as of 01/16/2023 - Review Complete 01/16/2023  Allergen Reaction Noted   Propofol Shortness Of Breath 04/20/2015  Zoloft [sertraline hcl] Other (See Comments) 02/11/2013   Quinolones   08/07/2021    Social History   Socioeconomic History   Marital status: Married    Spouse name: Not on file   Number of children: Not on file   Years of education: Not on file   Highest education level: Not on file  Occupational History   Not on file  Tobacco Use   Smoking status: Never   Smokeless tobacco: Never  Substance and Sexual Activity   Alcohol use: Yes    Comment: socially   Drug use: No   Sexual activity: Not on file  Other Topics Concern   Not on file  Social History Narrative   ** Merged History Encounter **       Social Determinants of Health   Financial Resource Strain: Not on file  Food Insecurity: Not on file  Transportation Needs: Not on file  Physical Activity: Not on file  Stress: Not on file  Social Connections: Not on file  Intimate Partner Violence: Not on file   Code Status   Code Status: Full Code  Review of Systems: All systems reviewed and negative except where noted in HPI.  Physical Exam: Vital signs in last 24 hours: Temp:  [97.7 F (36.5 C)-98.2 F (36.8 C)] 97.7 F (36.5 C) (10/04 0843) Pulse Rate:  [59-71] 64 (10/04 0800) Resp:  [14-17] 16 (10/04 0800) BP: (132-156)/(84-105) 134/84 (10/04 0800) SpO2:  [97 %-100 %] 98 % (10/04 0800) Weight:  [71.2 kg] 71.2 kg (10/04 0108)    General:  Pleasant male in NAD Psych:  Cooperative. Normal mood and affect Eyes: Pupils equal Ears:  Normal auditory acuity Nose: No deformity, discharge or lesions Neck:  Supple, no masses felt Lungs:  Clear to auscultation.  Heart:  Regular rate, regular rhythm, + murmur.  Abdomen:  Soft, nondistended, nontender, active bowel sounds, no masses felt Rectal :  Deferred Msk: Symmetrical without gross deformities.  Neurologic:  Alert, oriented, grossly normal neurologically Extremities : No edema Skin:  Intact without significant lesions.    Intake/Output from previous day: No intake/output data recorded. Intake/Output this shift:  Total  I/O In: 618.4 [I.V.:618.4] Out: -   Principal Problem:   Common bile duct (CBD) obstruction Active Problems:   Hypothyroidism   Hyperlipidemia   Mass of head of pancreas   Common bile duct obstruction   Jaundice   HTN (hypertension)    Willette Cluster, NP-C   01/17/2023, 8:56 AM

## 2023-01-17 NOTE — Progress Notes (Signed)
Pt admitted from the ED for abdominal/ flank pain x1 month which has been worsening.  He had a MRCP on Monday 9/30 revealing a pancreatic mass suspicious for pancreatic cancer.  Pt is currently awaiting results from his bx.  Per Labs pt's LFTs are elevated.  GI has been consulted and pt is pending a ERCP.  Pt was transported via wheelchair accompanied by a transporter.  Assisted pt into his room and obtained VS.  Reviewed POC with pt and made him aware of his new surroundings.  Informed pt to utilize RN call light for assistance.  Bed locked in lowest position, with two upper side rails engaged.  Belongings and call light within reach.

## 2023-01-17 NOTE — Progress Notes (Signed)
PROGRESS NOTE  Randy Allen    DOB: [birth year], 62 y.o.  ZOX:[id number]    Code Status: Full Code   DOA: 01/16/2023   LOS: 1   Brief hospital course  Randy Allen is a 62 y.o. male with a PMH significant for heart murmur, bicuspid heart valve, HLD, HTN, hypothyroidism.    He presents with abdominal pain 10/3.  Was recently diagnosed with pancreatic head mass on MRI and has oncology follow up schedule Oct 14th.    In the ER patient AST 334, ALT 465, T. bili 10.3.  Elevated LFTs are not new as of labs on 01/06/2023.  Patient's T. bili progressively increased from 5.1-10.3.  INR 1.2. MRCP: 1. Hypoenhancing mass within the central pancreatic head, which effaces the common bile duct and pancreatic ducts, measuring 2.6 x 2.4 cm. 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 2. Mass contacts the adjacent central superior mesenteric vein and portal confluence with less than 180 degrees of contact. Preserved fat plane to the adjacent superior mesenteric artery. 3. Distended gallbladder containing sludge. 4. No evidence of lymphadenopathy or metastatic disease in the abdomen.  They were initially treated with morphine, LR. GI was consulted.   Patient was admitted to medicine service for further workup and management of CBD obstruction as outlined in detail below.  01/17/23 -stable  Assessment & Plan  Principal Problem:   Common bile duct (CBD) obstruction Active Problems:   Hypothyroidism   Hyperlipidemia   Mass of head of pancreas   Common bile duct obstruction   Jaundice   HTN (hypertension)  CBD obstruction  Pancreatic adenocarcinoma- pain is moderately well controlled currently.  -GI consulted.  N.p.o. at midnight -Likely ERCP in a.m. -CMP, ammonia, INR levels in a.m. - analgesia PRN    Hyperlipidemia -Statin on hold given elevated LFTs    Hypothyroidism -Synthroid resumed    HTN -Enalapril resumed    Congenital bicuspid valve   Congenital murmur  Body  mass index is 23.87 kg/m.  VTE ppx: heparin injection 5,000 Units Start: 01/16/23 2330   Diet:     Diet   Diet NPO time specified Except for: Ice Chips, Sips with Meds   Consultants: GI  Subjective 01/17/23    Pt reports feeling alright currently. He is worried about his pancreatic mass diagnosis and asks me several questions about his prognosis and cancer specific questions. Would like to see oncology while inpatient.    Objective   Vitals:   01/17/23 0200 01/17/23 0453 01/17/23 0500 01/17/23 0600  BP: (!) 152/92  (!) 142/97 (!) 143/94  Pulse: 60  63 70  Resp: 16  14 16   Temp:  98 F (36.7 C)    TempSrc:  Oral    SpO2: 99%  99% 98%  Weight:      Height:       No intake or output data in the 24 hours ending 01/17/23 0716 Filed Weights   01/17/23 0108  Weight: 71.2 kg    Physical Exam:  General: awake, alert, NAD HEENT: atraumatic, scleral icterus, MMM, hearing grossly normal Respiratory: normal respiratory effort. Cardiovascular: quick capillary refill, normal S1/S2, RRR, no JVD, murmurs Gastrointestinal: soft, ND, mildly tender Nervous: A&O x3. no gross focal neurologic deficits, normal speech Extremities: moves all equally, no edema, normal tone Skin: dry, intact, normal temperature, mild jaundice. No rashes, lesions or ulcers on exposed skin Psychiatry: normal mood, congruent affect  Labs   I have personally reviewed the following labs and imaging studies CBC  Component Value Date/Time   WBC 7.0 01/16/2023 1827   RBC 4.78 01/16/2023 1827   HGB 14.2 01/16/2023 1827   HCT 43.5 01/16/2023 1827   PLT 265 01/16/2023 1827   MCV 91.0 01/16/2023 1827   MCH 29.7 01/16/2023 1827   MCHC 32.6 01/16/2023 1827   RDW 14.9 01/16/2023 1827   LYMPHSABS 1.3 01/14/2023 1550   MONOABS 0.6 01/14/2023 1550   EOSABS 0.3 01/14/2023 1550   BASOSABS 0.1 01/14/2023 1550      Latest Ref Rng & Units 01/17/2023    2:46 AM 01/16/2023    6:27 PM 01/14/2023    3:50 PM  BMP   Glucose 70 - 99 mg/dL 132  440  102   BUN 8 - 23 mg/dL 9  10  11    Creatinine 0.61 - 1.24 mg/dL 7.25  3.66  4.40   Sodium 135 - 145 mmol/L 137  139  139   Potassium 3.5 - 5.1 mmol/L 3.6  3.4  3.6   Chloride 98 - 111 mmol/L 104  103  100   CO2 22 - 32 mmol/L 24  22  31    Calcium 8.9 - 10.3 mg/dL 8.8  9.6  9.8     MR ABDOMEN MRCP W WO CONTAST  Result Date: 01/15/2023 CLINICAL DATA:  Epigastric pain, flank pain elevated LFTs, jaundice EXAM: MRI ABDOMEN WITHOUT AND WITH CONTRAST (INCLUDING MRCP) TECHNIQUE: Multiplanar multisequence MR imaging of the abdomen was performed both before and after the administration of intravenous contrast. Heavily T2-weighted images of the biliary and pancreatic ducts were obtained, and three-dimensional MRCP images were rendered by post processing. CONTRAST:  7mL GADAVIST GADOBUTROL 1 MMOL/ML IV SOLN COMPARISON:  CT abdomen pelvis, 01/10/2023 FINDINGS: Lower chest: No acute abnormality. Hepatobiliary: No solid liver abnormality is seen. Distended gallbladder containing sludge (series 3, image 7). Mild intra and extrahepatic biliary ductal dilatation, the common bile duct measuring up to 1.1 cm and abruptly truncated within the superior pancreatic head, although subtly reconstituted inferiorly within the pancreatic head (series 3, image 16, series 3, image 15). Pancreas: Hypoenhancing mass within the central pancreatic head, which effaces the common bile duct and pancreatic ducts, measuring 2.6 x 2.4 cm (series 16, image 48). The remainder of the pancreas is diffusely atrophic, with dilatation of the main pancreatic duct up to 0.6 cm (series 4, image 15). Mass contacts the adjacent central superior mesenteric vein and portal confluence with less than 180 degrees of contact (series 18, image 51). Preserved fat plane to the adjacent superior mesenteric artery. Spleen: Normal in size without significant abnormality. Adrenals/Urinary Tract: Adrenal glands are unremarkable. Simple,  benign bilateral renal cortical and parapelvic cysts, requiring no specific further follow-up or characterization. Kidneys are otherwise normal, without obvious renal calculi, solid lesion, or hydronephrosis. Stomach/Bowel: Stomach is within normal limits. No evidence of bowel wall thickening, distention, or inflammatory changes. Vascular/Lymphatic: No significant vascular findings are present. No enlarged abdominal lymph nodes. Other: No abdominal wall hernia or abnormality. No ascites. Musculoskeletal: No acute or significant osseous findings. IMPRESSION: 1. Hypoenhancing mass within the central pancreatic head, which effaces the common bile duct and pancreatic ducts, measuring 2.6 x 2.4 cm. 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 2. Mass contacts the adjacent central superior mesenteric vein and portal confluence with less than 180 degrees of contact. Preserved fat plane to the adjacent superior mesenteric artery. 3. Distended gallbladder containing sludge. 4. No evidence of lymphadenopathy or metastatic disease in the abdomen. These results will be called to the  ordering clinician or representative by the Radiologist Assistant, and communication documented in the PACS or [employer]. Electronically Signed   By: Jearld Lesch M.D.   On: 01/15/2023 16:06   MR 3D Recon At Scanner  Result Date: 01/15/2023 CLINICAL DATA:  Epigastric pain, flank pain elevated LFTs, jaundice EXAM: MRI ABDOMEN WITHOUT AND WITH CONTRAST (INCLUDING MRCP) TECHNIQUE: Multiplanar multisequence MR imaging of the abdomen was performed both before and after the administration of intravenous contrast. Heavily T2-weighted images of the biliary and pancreatic ducts were obtained, and three-dimensional MRCP images were rendered by post processing. CONTRAST:  7mL GADAVIST GADOBUTROL 1 MMOL/ML IV SOLN COMPARISON:  CT abdomen pelvis, 01/10/2023 FINDINGS: Lower chest: No acute abnormality. Hepatobiliary: No solid liver  abnormality is seen. Distended gallbladder containing sludge (series 3, image 7). Mild intra and extrahepatic biliary ductal dilatation, the common bile duct measuring up to 1.1 cm and abruptly truncated within the superior pancreatic head, although subtly reconstituted inferiorly within the pancreatic head (series 3, image 16, series 3, image 15). Pancreas: Hypoenhancing mass within the central pancreatic head, which effaces the common bile duct and pancreatic ducts, measuring 2.6 x 2.4 cm (series 16, image 48). The remainder of the pancreas is diffusely atrophic, with dilatation of the main pancreatic duct up to 0.6 cm (series 4, image 15). Mass contacts the adjacent central superior mesenteric vein and portal confluence with less than 180 degrees of contact (series 18, image 51). Preserved fat plane to the adjacent superior mesenteric artery. Spleen: Normal in size without significant abnormality. Adrenals/Urinary Tract: Adrenal glands are unremarkable. Simple, benign bilateral renal cortical and parapelvic cysts, requiring no specific further follow-up or characterization. Kidneys are otherwise normal, without obvious renal calculi, solid lesion, or hydronephrosis. Stomach/Bowel: Stomach is within normal limits. No evidence of bowel wall thickening, distention, or inflammatory changes. Vascular/Lymphatic: No significant vascular findings are present. No enlarged abdominal lymph nodes. Other: No abdominal wall hernia or abnormality. No ascites. Musculoskeletal: No acute or significant osseous findings. IMPRESSION: 1. Hypoenhancing mass within the central pancreatic head, which effaces the common bile duct and pancreatic ducts, measuring 2.6 x 2.4 cm. 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 2. Mass contacts the adjacent central superior mesenteric vein and portal confluence with less than 180 degrees of contact. Preserved fat plane to the adjacent superior mesenteric artery. 3. Distended  gallbladder containing sludge. 4. No evidence of lymphadenopathy or metastatic disease in the abdomen. These results will be called to the ordering clinician or representative by the Radiologist Assistant, and communication documented in the PACS or [employer]. Electronically Signed   By: Jearld Lesch M.D.   On: 01/15/2023 16:06    Disposition Plan & Communication  Patient status: Inpatient  Admitted From: Home Planned disposition location: Home Anticipated discharge date: 10/6 pending ERCP  Family Communication: none at bedside    Author: Leeroy Bock, DO [facility] 01/17/2023, 7:16 AM   Available by Epic secure chat 7AM-7PM. If 7PM-7AM, please contact night-coverage.  [facility] contact information found on [email or web address].

## 2023-01-17 NOTE — H&P (View-Only) (Signed)
Attending physician's note   I have taken a history, reviewed the chart, and examined the patient. I performed a substantive portion of this encounter, including complete performance of at least one of the key components, in conjunction with the APP. I agree with the APP's note, impression, and recommendations with my edits.   62 year old male with medical history as outlined below, seen earlier this week in the GI clinic for evaluation of elevated liver enzymes, epigastric pain, unintentional weight loss.  He states he started having abdominal pain in June, which abated for the most part through the summer and started again in September.  Has lost approximately 15#.  Started noticing darkening urine and epigastric pain over the last 2 weeks or so, then friends noticed jaundiced complexion last week.  Recent evaluation notable for the following: - 01/15/2023: MRI/MRCP 2.6 x 2.4 cm hypoenhancing mass in the central pancreatic head which effaces the CBD and PD.  Mass contacts the adjacent central SMV and portal confluence with less than 180 degrees of contact.  Preserved fat plane to the adjacent SMA.  CBD dilation up to 1.1 cm and abruptly truncated within the superior pancreatic head.  No lymphadenopathy - 01/17/2023: AST/ALT 293/372, ALP 608, T. bili 8.9 - Normal PT/INR, CBC, lipase  1) Pancreatic mass 2) Obstructive jaundice 3) Unintentional weight loss 4) Elevated liver enzymes 5) Epigastric pain I discussed the radiographic findings, labs, and clinical presentation with the patient and family members at bedside at length today.  We discussed plan for ERCP for bile duct decompression to start, and will likely need EUS to follow in the near future.  - Plan for ERCP with Dr. Marina Goodell tomorrow for biliary decompression and possible bile duct brushing - Diet okay today with n.p.o. at midnight - Already has appointment in the Oncology Clinic on 10/14 - Repeat liver enzymes in the morning - Hold  heparin prophy this evening and tomorrow  The indications, risks, and benefits of ERCP were explained to the patient in detail. Risks include but are not limited to bleeding, perforation, adverse reaction to medications, pancreatitis, inability to cannulate duct, and cardiopulmonary compromise. Sequelae include but are not limited to the possibility of surgery, prolonged hospitalization, and mortality. The patient verbalized understanding and wishes to proceed. All questions answered to the [place] of my ability.    [address], DO, [place] [phone] office                                                     Consultation Note   Referring Provider:  [facility] PCP: Lucky Cowboy, MD Primary Gastroenterologist: Doristine Locks, MD     Reason for Consultation: pancreatic mass / obstructive jaundice.   DOA: 01/16/2023         Hospital Day: 2   ASSESSMENT    Brief Narrative:  62 y.o. year old male with a history of HTN, HL, GERD, hypothyroidism bicuspid AV, ascending aortic aneurysm. Recently diagnosed pancreatic mass after presenting with obstructive jaundice and abdominal pain. Was to get ERCP but came to ED with worsening pain.   Obstructive jaundice / pancreatic head mass   MRCP >Hypoenhancing mass within the central pancreatic head measuring 2.6 x 2.4 cm.. Mass contacts the adjacent central superior mesenteric vein and portal confluence with less than 180 degrees of contact. Tbili 8.9  PLAN:   --Probable decompressive ERCP tomorrow. Will discuss with our biliary endoscopist --He is schedule to see Oncology on 10/14 --Ok to eat today, will order diet   HPI   Randy Allen was seen in our office on 01/14/23 for elevated LFTs ( mixed pattern) and epigastric pain  as well as unintentional weight loss. CT scan was unrevealing in late Sept. We ordered MRCP which showed a pancreatic head mass. He is scheduled for ERCP in a few days but came to ED with worsening abdominal pain  . Having periumbilical pain which is almost constant. Has intermittent mid back as well. No nausea / vomiting. Endorses dark urine, pale stools. No itching.   Labs  CBC normal Alk phos 608 AST 293 / ALT 272 Tbili 8.9 INR 1/1   MRI / MRCP 1. Hypoenhancing mass within the central pancreatic head, which effaces the common bile duct and pancreatic ducts, measuring 2.6 x 2.4 cm. 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 2. Mass contacts the adjacent central superior mesenteric vein and portal confluence with less than 180 degrees of contact. Preserved fat plane to the adjacent superior mesenteric artery. 3. Distended gallbladder containing sludge. 4. No evidence of lymphadenopathy or metastatic disease in the abdomen.  Previous GI Evaluations  Colonoscopy July 2015 Sigmoid diverticulosis, o/w normal   Labs and Imaging: Recent Labs    01/14/23 1550 01/16/23 1827  WBC 5.3 7.0  HGB 15.0 14.2  HCT 45.1 43.5  PLT 279.0 265   Recent Labs    01/14/23 1550 01/16/23 1827 01/17/23 0246  NA 139 139 137  K 3.6 3.4* 3.6  CL 100 103 104  CO2 31 22 24   GLUCOSE 118* 110* 104*  BUN 11 10 9   CREATININE 1.15 0.99 1.04  CALCIUM 9.8 9.6 8.8*   Recent Labs    01/17/23 0246  PROT 5.9*  ALBUMIN 2.8*  AST 293*  ALT 372*  ALKPHOS 608*  BILITOT 8.9*   No results for input(s): "HEPBSAG", "HCVAB", "HEPAIGM", "HEPBIGM" in the last 72 hours. Recent Labs    01/14/23 1550 01/17/23 0246  LABPROT 12.6 14.4  INR 1.2* 1.1      Past Medical History:  Diagnosis Date   Dental crowns present    Heart murmur    states need for antibiotics prior to dental procedures; no cardiologist   High blood cholesterol    History of hepatitis A    age 62   History of MRSA infection 04/15/2013   right great toe   Hyperlipidemia    Hypertension    states under control with med., has been on med. > 10 yr.   Hypothyroidism    Mass of skin of right shoulder 04/16/2015   Neck mass  04/16/2015   posterior   Seasonal allergies    Vitamin D deficiency     Past Surgical History:  Procedure Laterality Date   COLONOSCOPY WITH PROPOFOL  10/21/2013   EYE SURGERY Right 1966   HYDROCELE EXCISION     MASS EXCISION Right 04/26/2015   Procedure: EXCISION MASS NECK AND RIGHT SHOULDER ;  Surgeon: Luretha Murphy, MD;  Location: [facility];  Service: General;  Laterality: Right;    Family History  Problem Relation Age of Onset   Arthritis Mother    Depression Mother    Migraines Mother    Cancer Father        prostate   Arthritis Father    Ulcers Father    Heart attack Father  Colon cancer Maternal Uncle 62    Prior to Admission medications   Medication Sig Start Date End Date Taking? Authorizing Provider  aspirin EC 81 MG tablet Take 81 mg by mouth at bedtime.   Yes [provider]  atorvastatin (LIPITOR) 40 MG tablet Take  1 tablet  Daily  for Cholesterol Patient taking differently: Take 20 mg by mouth at bedtime. 06/21/21  Yes Lucky Cowboy, MD  enalapril (VASOTEC) 10 MG tablet take 1 tablet by mouth daily for blood pressure Patient taking differently: Take 10 mg by mouth at bedtime. take 1 tablet by mouth daily for blood pressure 01/06/23  Yes Cranford, Archie Patten, NP  famotidine (PEPCID) 40 MG tablet take 1 tablet by mouth daily for heart burn and acid indigestion 10/07/22  Yes Raynelle Dick, NP  levothyroxine (SYNTHROID) 75 MCG tablet Take  1 tablet  Daily  on an empty stomach with only water for 30 minutes & no Antacid meds, Calcium or Magnesium for 4 hours & avoid Biotin Patient taking differently: 1.5 tablets by mouth Monday Wednesday Friday. 1 tablet by mouth all other days 03/06/22  Yes Lucky Cowboy, MD  Multiple Vitamin (MULTIVITAMIN) tablet Take 1 tablet by mouth daily. Centrum Silver   Yes [provider]    Current Facility-Administered Medications  Medication Dose Route Frequency Provider Last Rate Last Admin   0.9 %  NaCl with KCl 20 mEq/ L  infusion   Intravenous Continuous Crosley, Debby, MD 75 mL/hr at 01/17/23 0839 Infusion Verify at 01/17/23 0839   enalapril (VASOTEC) tablet 10 mg  10 mg Oral QHS Crosley, Debby, MD   10 mg at 01/17/23 0012   heparin injection 5,000 Units  5,000 Units Subcutaneous Q8H Crosley, Debby, MD   5,000 Units at 01/17/23 0534   HYDROmorphone (DILAUDID) injection 0.5 mg  0.5 mg Intravenous Q3H PRN Crosley, Debby, MD   0.5 mg at 01/17/23 0540   levothyroxine (SYNTHROID) tablet 75 mcg  75 mcg Oral Q0600 Gery Pray, MD   75 mcg at 01/17/23 0533   ondansetron (ZOFRAN) injection 4 mg  4 mg Intravenous Q6H PRN Crosley, Debby, MD       senna-docusate (Senokot-S) tablet 1 tablet  1 tablet Oral QHS PRN Gery Pray, MD       Current Outpatient Medications  Medication Sig Dispense Refill   aspirin EC 81 MG tablet Take 81 mg by mouth at bedtime.     atorvastatin (LIPITOR) 40 MG tablet Take  1 tablet  Daily  for Cholesterol (Patient taking differently: Take 20 mg by mouth at bedtime.) 90 tablet 3   enalapril (VASOTEC) 10 MG tablet take 1 tablet by mouth daily for blood pressure (Patient taking differently: Take 10 mg by mouth at bedtime. take 1 tablet by mouth daily for blood pressure) 90 tablet 0   famotidine (PEPCID) 40 MG tablet take 1 tablet by mouth daily for heart burn and acid indigestion 90 tablet 0   levothyroxine (SYNTHROID) 75 MCG tablet Take  1 tablet  Daily  on an empty stomach with only water for 30 minutes & no Antacid meds, Calcium or Magnesium for 4 hours & avoid Biotin (Patient taking differently: 1.5 tablets by mouth Monday Wednesday Friday. 1 tablet by mouth all other days) 90 tablet 3   Multiple Vitamin (MULTIVITAMIN) tablet Take 1 tablet by mouth daily. Centrum Silver      Allergies as of 01/16/2023 - Review Complete 01/16/2023  Allergen Reaction Noted   Propofol Shortness Of Breath 04/20/2015  Zoloft [sertraline hcl] Other (See Comments) 02/11/2013   Quinolones   08/07/2021    Social History   Socioeconomic History   Marital status: Married    Spouse name: Not on file   Number of children: Not on file   Years of education: Not on file   Highest education level: Not on file  Occupational History   Not on file  Tobacco Use   Smoking status: Never   Smokeless tobacco: Never  Substance and Sexual Activity   Alcohol use: Yes    Comment: socially   Drug use: No   Sexual activity: Not on file  Other Topics Concern   Not on file  Social History Narrative   ** Merged History Encounter **       Social Determinants of Health   Financial Resource Strain: Not on file  Food Insecurity: Not on file  Transportation Needs: Not on file  Physical Activity: Not on file  Stress: Not on file  Social Connections: Not on file  Intimate Partner Violence: Not on file   Code Status   Code Status: Full Code  Review of Systems: All systems reviewed and negative except where noted in HPI.  Physical Exam: Vital signs in last 24 hours: Temp:  [97.7 F (36.5 C)-98.2 F (36.8 C)] 97.7 F (36.5 C) (10/04 0843) Pulse Rate:  [59-71] 64 (10/04 0800) Resp:  [14-17] 16 (10/04 0800) BP: (132-156)/(84-105) 134/84 (10/04 0800) SpO2:  [97 %-100 %] 98 % (10/04 0800) Weight:  [71.2 kg] 71.2 kg (10/04 0108)    General:  Pleasant male in NAD Psych:  Cooperative. Normal mood and affect Eyes: Pupils equal Ears:  Normal auditory acuity Nose: No deformity, discharge or lesions Neck:  Supple, no masses felt Lungs:  Clear to auscultation.  Heart:  Regular rate, regular rhythm, + murmur.  Abdomen:  Soft, nondistended, nontender, active bowel sounds, no masses felt Rectal :  Deferred Msk: Symmetrical without gross deformities.  Neurologic:  Alert, oriented, grossly normal neurologically Extremities : No edema Skin:  Intact without significant lesions.    Intake/Output from previous day: No intake/output data recorded. Intake/Output this shift:  Total  I/O In: 618.4 [I.V.:618.4] Out: -   Principal Problem:   Common bile duct (CBD) obstruction Active Problems:   Hypothyroidism   Hyperlipidemia   Mass of head of pancreas   Common bile duct obstruction   Jaundice   HTN (hypertension)    Willette Cluster, NP-C   01/17/2023, 8:56 AM

## 2023-01-17 NOTE — ED Notes (Signed)
ED TO INPATIENT HANDOFF REPORT  ED Nurse Name and Phone #: Alvino Chapel 17  S Name/Age/Gender Randy Allen 62 y.o. male Room/Bed: 001C/001C  Code Status   Code Status: Full Code  Home/SNF/Other Home Patient oriented to: self, place, time, and situation Is this baseline? Yes   Triage Complete: Triage complete  Chief Complaint Common bile duct (CBD) obstruction [K83.1]  Triage Note Pt c/o abd and bilateral flank pain x 1 month, worsening ; had MRI Tuesday, was told he has pancreatic cancer; ERCP scheduled for 1 week from today with Dr. Chales Abrahams; denies N/V, denies fevers, endorses dark urine and light stool   Allergies Allergies  Allergen Reactions   Propofol Shortness Of Breath    CHEST TIGHTNESS _Patient stated he was told this medication was "pushed to fast" and that is what caused his symptoms   Zoloft [Sertraline Hcl] Other (See Comments)    HYPERACTIVE   Quinolones     Aortic aneurysm    Level of Care/Admitting Diagnosis ED Disposition     ED Disposition  Admit   Condition  --   Comment  Hospital Area: [facility] [[id number]]  Level of Care: Med-Surg [16]  May admit patient to [facility] or [facility] if equivalent level of care is available:: No  Covid Evaluation: Asymptomatic - no recent exposure (last 10 days) testing not required  Diagnosis: Common bile duct (CBD) obstruction [161096]  Admitting Physician: Gery Pray [[id number]]  Attending Physician: Gery Pray [[id number]]  Certification:: I certify this patient will need inpatient services for at least 2 midnights  Expected Medical Readiness: 01/18/2023          B Medical/Surgery History Past Medical History:  Diagnosis Date   Dental crowns present    Heart murmur    states need for antibiotics prior to dental procedures; no cardiologist   High blood cholesterol    History of hepatitis A    age 74   History of MRSA infection 04/15/2013   right great toe   Hyperlipidemia     Hypertension    states under control with med., has been on med. > 10 yr.   Hypothyroidism    Mass of skin of right shoulder 04/16/2015   Neck mass 04/16/2015   posterior   Seasonal allergies    Vitamin D deficiency    Past Surgical History:  Procedure Laterality Date   COLONOSCOPY WITH PROPOFOL  10/21/2013   EYE SURGERY Right 1966   HYDROCELE EXCISION     MASS EXCISION Right 04/26/2015   Procedure: EXCISION MASS NECK AND RIGHT SHOULDER ;  Surgeon: Luretha Murphy, MD;  Location:  SURGERY CENTER;  Service: General;  Laterality: Right;     A IV Location/Drains/Wounds Patient Lines/Drains/Airways Status     Active Line/Drains/Airways     Name Placement date Placement time Site Days   Peripheral IV 01/16/23 18 G Anterior;Distal;Left;Upper Arm 01/16/23  2134  Arm  1            Intake/Output Last 24 hours No intake or output data in the 24 hours ending 01/17/23 0454  Labs/Imaging Results for orders placed or performed during the hospital encounter of 01/16/23 (from the past 48 hour(s))  Lipase, blood     Status: None   Collection Time: 01/16/23  6:27 PM  Result Value Ref Range   Lipase 26 11 - 51 U/L    Comment: Performed at [facility], [address], [place], [place] [address]  Comprehensive  metabolic panel     Status: Abnormal   Collection Time: 01/16/23  6:27 PM  Result Value Ref Range   Sodium 139 135 - 145 mmol/L   Potassium 3.4 (L) 3.5 - 5.1 mmol/L   Chloride 103 98 - 111 mmol/L   CO2 22 22 - 32 mmol/L   Glucose, Bld 110 (H) 70 - 99 mg/dL    Comment: Glucose reference range applies only to samples taken after fasting for at least 8 hours.   BUN 10 8 - 23 mg/dL   Creatinine, Ser 1.61 0.61 - 1.24 mg/dL   Calcium 9.6 8.9 - 09.6 mg/dL   Total Protein 6.9 6.5 - 8.1 g/dL   Albumin 3.3 (L) 3.5 - 5.0 g/dL   AST 045 (H) 15 - 41 U/L   ALT 465 (H) 0 - 44 U/L   Alkaline Phosphatase 745 (H) 38 - 126 U/L   Total Bilirubin 10.3 (H) 0.3 - 1.2 mg/dL   GFR,  Estimated >40 >98 mL/min    Comment: (NOTE) Calculated using the CKD-EPI Creatinine Equation (2021)    Anion gap 14 5 - 15    Comment: Performed at [facility], [address] [place], [place] [address]  CBC     Status: None   Collection Time: 01/16/23  6:27 PM  Result Value Ref Range   WBC 7.0 4.0 - 10.5 K/uL   RBC 4.78 4.22 - 5.81 MIL/uL   Hemoglobin 14.2 13.0 - 17.0 g/dL   HCT 78.2 95.6 - 21.3 %   MCV 91.0 80.0 - 100.0 fL   MCH 29.7 26.0 - 34.0 pg   MCHC 32.6 30.0 - 36.0 g/dL   RDW 08.6 57.8 - 46.9 %   Platelets 265 150 - 400 K/uL   nRBC 0.0 0.0 - 0.2 %    Comment: Performed at [facility], [address], [place], [place] [address]  Urinalysis, Routine w reflex microscopic -Urine, Clean Catch     Status: Abnormal   Collection Time: 01/16/23  6:27 PM  Result Value Ref Range   Color, Urine AMBER (A) YELLOW    Comment: BIOCHEMICALS MAY BE AFFECTED BY COLOR   APPearance CLEAR CLEAR   Specific Gravity, Urine 1.004 (L) 1.005 - 1.030   pH 6.0 5.0 - 8.0   Glucose, UA NEGATIVE NEGATIVE mg/dL   Hgb urine dipstick SMALL (A) NEGATIVE   Bilirubin Urine NEGATIVE NEGATIVE   Ketones, ur NEGATIVE NEGATIVE mg/dL   Protein, ur NEGATIVE NEGATIVE mg/dL   Nitrite NEGATIVE NEGATIVE   Leukocytes,Ua NEGATIVE NEGATIVE   RBC / HPF 0-5 0 - 5 RBC/hpf   WBC, UA 0-5 0 - 5 WBC/hpf   Bacteria, UA NONE SEEN NONE SEEN   Squamous Epithelial / HPF 0-5 0 - 5 /HPF    Comment: Performed at [facility], [address] [place], [place] [address]  Protime-INR     Status: None   Collection Time: 01/17/23  2:46 AM  Result Value Ref Range   Prothrombin Time 14.4 11.4 - 15.2 seconds   INR 1.1 0.8 - 1.2    Comment: (NOTE) INR goal varies based on device and disease states. Performed at [facility], [address] [place], [place] [address]   Comprehensive metabolic panel     Status: Abnormal   Collection Time: 01/17/23  2:46 AM  Result Value Ref Range   Sodium 137 135 - 145  mmol/L   Potassium 3.6 3.5 - 5.1 mmol/L  Chloride 104 98 - 111 mmol/L   CO2 24 22 - 32 mmol/L   Glucose, Bld 104 (H) 70 - 99 mg/dL    Comment: Glucose reference range applies only to samples taken after fasting for at least 8 hours.   BUN 9 8 - 23 mg/dL   Creatinine, Ser 4.09 0.61 - 1.24 mg/dL   Calcium 8.8 (L) 8.9 - 10.3 mg/dL   Total Protein 5.9 (L) 6.5 - 8.1 g/dL   Albumin 2.8 (L) 3.5 - 5.0 g/dL   AST 811 (H) 15 - 41 U/L   ALT 372 (H) 0 - 44 U/L   Alkaline Phosphatase 608 (H) 38 - 126 U/L   Total Bilirubin 8.9 (H) 0.3 - 1.2 mg/dL   GFR, Estimated >91 >47 mL/min    Comment: (NOTE) Calculated using the CKD-EPI Creatinine Equation (2021)    Anion gap 9 5 - 15    Comment: Performed at [facility], [address], [place], [place] [address]  Ammonia     Status: Abnormal   Collection Time: 01/17/23  2:48 AM  Result Value Ref Range   Ammonia 40 (H) 9 - 35 umol/L    Comment: Performed at [facility], [address], [place], [place] [address]   MR ABDOMEN MRCP W WO CONTAST  Result Date: 01/15/2023 CLINICAL DATA:  Epigastric pain, flank pain elevated LFTs, jaundice EXAM: MRI ABDOMEN WITHOUT AND WITH CONTRAST (INCLUDING MRCP) TECHNIQUE: Multiplanar multisequence MR imaging of the abdomen was performed both before and after the administration of intravenous contrast. Heavily T2-weighted images of the biliary and pancreatic ducts were obtained, and three-dimensional MRCP images were rendered by post processing. CONTRAST:  7mL GADAVIST GADOBUTROL 1 MMOL/ML IV SOLN COMPARISON:  CT abdomen pelvis, 01/10/2023 FINDINGS: Lower chest: No acute abnormality. Hepatobiliary: No solid liver abnormality is seen. Distended gallbladder containing sludge (series 3, image 7). Mild intra and extrahepatic biliary ductal dilatation, the common bile duct measuring up to 1.1 cm and abruptly truncated within the superior pancreatic head, although subtly reconstituted inferiorly within the pancreatic head  (series 3, image 16, series 3, image 15). Pancreas: Hypoenhancing mass within the central pancreatic head, which effaces the common bile duct and pancreatic ducts, measuring 2.6 x 2.4 cm (series 16, image 48). The remainder of the pancreas is diffusely atrophic, with dilatation of the main pancreatic duct up to 0.6 cm (series 4, image 15). Mass contacts the adjacent central superior mesenteric vein and portal confluence with less than 180 degrees of contact (series 18, image 51). Preserved fat plane to the adjacent superior mesenteric artery. Spleen: Normal in size without significant abnormality. Adrenals/Urinary Tract: Adrenal glands are unremarkable. Simple, benign bilateral renal cortical and parapelvic cysts, requiring no specific further follow-up or characterization. Kidneys are otherwise normal, without obvious renal calculi, solid lesion, or hydronephrosis. Stomach/Bowel: Stomach is within normal limits. No evidence of bowel wall thickening, distention, or inflammatory changes. Vascular/Lymphatic: No significant vascular findings are present. No enlarged abdominal lymph nodes. Other: No abdominal wall hernia or abnormality. No ascites. Musculoskeletal: No acute or significant osseous findings. IMPRESSION: 1. Hypoenhancing mass within the central pancreatic head, which effaces the common bile duct and pancreatic ducts, measuring 2.6 x 2.4 cm. 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 2. Mass contacts the adjacent central superior mesenteric vein and portal confluence with less than 180 degrees of contact. Preserved fat plane to the adjacent superior mesenteric artery. 3. Distended gallbladder containing sludge. 4. No evidence of lymphadenopathy or metastatic disease in  the abdomen. These results will be called to the ordering clinician or representative by the Radiologist Assistant, and communication documented in the PACS or [employer]. Electronically Signed   By: Jearld Lesch M.D.    On: 01/15/2023 16:06   MR 3D Recon At Scanner  Result Date: 01/15/2023 CLINICAL DATA:  Epigastric pain, flank pain elevated LFTs, jaundice EXAM: MRI ABDOMEN WITHOUT AND WITH CONTRAST (INCLUDING MRCP) TECHNIQUE: Multiplanar multisequence MR imaging of the abdomen was performed both before and after the administration of intravenous contrast. Heavily T2-weighted images of the biliary and pancreatic ducts were obtained, and three-dimensional MRCP images were rendered by post processing. CONTRAST:  7mL GADAVIST GADOBUTROL 1 MMOL/ML IV SOLN COMPARISON:  CT abdomen pelvis, 01/10/2023 FINDINGS: Lower chest: No acute abnormality. Hepatobiliary: No solid liver abnormality is seen. Distended gallbladder containing sludge (series 3, image 7). Mild intra and extrahepatic biliary ductal dilatation, the common bile duct measuring up to 1.1 cm and abruptly truncated within the superior pancreatic head, although subtly reconstituted inferiorly within the pancreatic head (series 3, image 16, series 3, image 15). Pancreas: Hypoenhancing mass within the central pancreatic head, which effaces the common bile duct and pancreatic ducts, measuring 2.6 x 2.4 cm (series 16, image 48). The remainder of the pancreas is diffusely atrophic, with dilatation of the main pancreatic duct up to 0.6 cm (series 4, image 15). Mass contacts the adjacent central superior mesenteric vein and portal confluence with less than 180 degrees of contact (series 18, image 51). Preserved fat plane to the adjacent superior mesenteric artery. Spleen: Normal in size without significant abnormality. Adrenals/Urinary Tract: Adrenal glands are unremarkable. Simple, benign bilateral renal cortical and parapelvic cysts, requiring no specific further follow-up or characterization. Kidneys are otherwise normal, without obvious renal calculi, solid lesion, or hydronephrosis. Stomach/Bowel: Stomach is within normal limits. No evidence of bowel wall thickening, distention,  or inflammatory changes. Vascular/Lymphatic: No significant vascular findings are present. No enlarged abdominal lymph nodes. Other: No abdominal wall hernia or abnormality. No ascites. Musculoskeletal: No acute or significant osseous findings. IMPRESSION: 1. Hypoenhancing mass within the central pancreatic head, which effaces the common bile duct and pancreatic ducts, measuring 2.6 x 2.4 cm. 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 2. Mass contacts the adjacent central superior mesenteric vein and portal confluence with less than 180 degrees of contact. Preserved fat plane to the adjacent superior mesenteric artery. 3. Distended gallbladder containing sludge. 4. No evidence of lymphadenopathy or metastatic disease in the abdomen. These results will be called to the ordering clinician or representative by the Radiologist Assistant, and communication documented in the PACS or [employer]. Electronically Signed   By: Jearld Lesch M.D.   On: 01/15/2023 16:06    Pending Labs Unresulted Labs (From admission, onward)    None       Vitals/Pain Today's Vitals   01/17/23 0600 01/17/23 0605 01/17/23 0700 01/17/23 0800  BP: (!) 143/94  132/89 134/84  Pulse: 70  64 64  Resp: 16  16   Temp:      TempSrc:      SpO2: 98%  97% 98%  Weight:      Height:      PainSc: 2  2       Isolation Precautions No active isolations  Medications Medications  ondansetron (ZOFRAN) injection 4 mg (has no administration in time range)  HYDROmorphone (DILAUDID) injection 0.5 mg (0.5 mg Intravenous Given 01/17/23 0540)  0.9 % NaCl with KCl 20 mEq/ L  infusion ( Intravenous  New Bag/Given 01/17/23 0018)  enalapril (VASOTEC) tablet 10 mg (10 mg Oral Given 01/17/23 0012)  levothyroxine (SYNTHROID) tablet 75 mcg (75 mcg Oral Given 01/17/23 0533)  heparin injection 5,000 Units (5,000 Units Subcutaneous Given 01/17/23 0534)  senna-docusate (Senokot-S) tablet 1 tablet (has no administration in time range)   lactated ringers bolus 1,000 mL (0 mLs Intravenous Stopped 01/17/23 0213)  morphine (PF) 4 MG/ML injection 4 mg (4 mg Intravenous Given 01/16/23 2135)    Mobility walks     Focused Assessments Gastrointestinal   R Recommendations: See Admitting Provider Note  Report given to:   Additional Notes:

## 2023-01-17 NOTE — Plan of Care (Signed)
Problem: Education: Goal: Knowledge of General Education information will improve Description: Including pain rating scale, medication(s)/side effects and non-pharmacologic comfort measures Outcome: Progressing Pt understands he was admitted for abdominal/ flank pain x1 month which has been worsening. He had a MRCP on Monday 9/30 revealing a pancreatic mass suspicious for pancreatic cancer. Pt is currently awaiting results from his bx. Per Labs pt's LFTs are elevated. GI has been consulted and pt is pending a ERCP.    Problem: Clinical Measurements: Goal: Will remain free from infection Outcome: Progressing S/Sx of infection monitored and assessed q-shift.  Pt has remained afebrile thus far.     Problem: Clinical Measurements: Goal: Respiratory complications will improve Outcome: Progressing Respiratory status monitored and assessed q-shift.  Pt is on room air with PO2 at 98% and respiration rate of 16 breaths per minute.  Pt has not endorsed c/o SOB or DOE.    Problem: Clinical Measurements: Goal: Cardiovascular complication will be avoided Outcome: Progressing Pt's VS WNL thus far.    Problem: Activity: Goal: Risk for activity intolerance will decrease Outcome: Progressing Pt is independent of all his ADLs.  He can get up OOB with a steady gait independently.    Problem: Nutrition: Goal: Adequate nutrition will be maintained Outcome: Progressing Pt is on a regular diet per MD's orders and can tolerate it w/o s/sx of abdominal pain/ distention or n/v.    Problem: Safety: Goal: Ability to remain free from injury will improve Outcome: Progressing Pt has remained free from falls thus far.  Instructed pt to utilize RN call light for assistance.  Hourly rounds performed.  Bed in lowest position, locked with two upper side rails engaged.  Belongings and call light within reach.    Problem: Skin Integrity: Goal: Risk for impaired skin integrity will decrease Outcome: Progressing Skin  integrity monitored and assessed q-shift.  Instructed pt to turn q2 hours to prevent further skin impairment.  Tubes and drains assessed for device related pressures sores.  Pt is continent of both bowel and bladder.

## 2023-01-17 NOTE — ED Notes (Signed)
Pt ambulatory to bathroom with strong and steady gait.

## 2023-01-18 ENCOUNTER — Inpatient Hospital Stay (HOSPITAL_COMMUNITY): Payer: BC Managed Care – PPO

## 2023-01-18 ENCOUNTER — Inpatient Hospital Stay (HOSPITAL_COMMUNITY): Payer: BC Managed Care – PPO | Admitting: Certified Registered Nurse Anesthetist

## 2023-01-18 ENCOUNTER — Encounter (HOSPITAL_COMMUNITY)
Admission: EM | Disposition: A | Discharge status: Home / Self Care | Payer: Self-pay | Source: Home / Self Care | Attending: Internal Medicine

## 2023-01-18 ENCOUNTER — Encounter (HOSPITAL_COMMUNITY): Payer: Self-pay | Admitting: Family Medicine

## 2023-01-18 DIAGNOSIS — K831 Obstruction of bile duct: Secondary | ICD-10-CM | POA: Diagnosis not present

## 2023-01-18 HISTORY — PX: ENDOSCOPIC RETROGRADE CHOLANGIOPANCREATOGRAPHY (ERCP) WITH PROPOFOL: SHX5810

## 2023-01-18 HISTORY — PX: BILIARY STENT PLACEMENT: SHX5538

## 2023-01-18 HISTORY — PX: BILIARY BRUSHING: SHX6843

## 2023-01-18 HISTORY — PX: SPHINCTEROTOMY: SHX5279

## 2023-01-18 LAB — COMPREHENSIVE METABOLIC PANEL
ALT: 362 U/L — ABNORMAL HIGH (ref 0–44)
AST: 271 U/L — ABNORMAL HIGH (ref 15–41)
Albumin: 2.8 g/dL — ABNORMAL LOW (ref 3.5–5.0)
Alkaline Phosphatase: 682 U/L — ABNORMAL HIGH (ref 38–126)
Anion gap: 7 (ref 5–15)
BUN: 5 mg/dL — ABNORMAL LOW (ref 8–23)
CO2: 26 mmol/L (ref 22–32)
Calcium: 8.5 mg/dL — ABNORMAL LOW (ref 8.9–10.3)
Chloride: 103 mmol/L (ref 98–111)
Creatinine, Ser: 0.97 mg/dL (ref 0.61–1.24)
GFR, Estimated: >60 mL/min (ref >60)
Glucose, Bld: 240 mg/dL — ABNORMAL HIGH (ref 70–99)
Potassium: 3.4 mmol/L — ABNORMAL LOW (ref 3.5–5.1)
Sodium: 136 mmol/L (ref 135–145)
Total Bilirubin: 8 mg/dL — ABNORMAL HIGH (ref 0.3–1.2)
Total Protein: 6.1 g/dL — ABNORMAL LOW (ref 6.5–8.1)

## 2023-01-18 LAB — CBC
HCT: 39.7 % (ref 39.0–52.0)
Hemoglobin: 13.2 g/dL (ref 13.0–17.0)
MCH: 29.4 pg (ref 26.0–34.0)
MCHC: 33.2 g/dL (ref 30.0–36.0)
MCV: 88.4 fL (ref 80.0–100.0)
Platelets: 234 10*3/uL (ref 150–400)
RBC: 4.49 MIL/uL (ref 4.22–5.81)
RDW: 15.2 % (ref 11.5–15.5)
WBC: 7.5 10*3/uL (ref 4.0–10.5)
nRBC: 0 % (ref 0.0–0.2)

## 2023-01-18 SURGERY — ENDOSCOPIC RETROGRADE CHOLANGIOPANCREATOGRAPHY (ERCP) WITH PROPOFOL
Anesthesia: General

## 2023-01-18 MED ORDER — CIPROFLOXACIN IN D5W 400 MG/200ML IV SOLN
INTRAVENOUS | Status: AC
  Filled 2023-01-18: qty 200

## 2023-01-18 MED ORDER — GLUCAGON HCL RDNA (DIAGNOSTIC) 1 MG IJ SOLR
INTRAMUSCULAR | Status: AC
  Filled 2023-01-18: qty 1

## 2023-01-18 MED ORDER — FENTANYL CITRATE (PF) 250 MCG/5ML IJ SOLN
INTRAMUSCULAR | Status: DC | PRN
  Administered 2023-01-18 (×2): 50 ug via INTRAVENOUS

## 2023-01-18 MED ORDER — PHENYLEPHRINE 80 MCG/ML (10ML) SYRINGE FOR IV PUSH (FOR BLOOD PRESSURE SUPPORT)
PREFILLED_SYRINGE | INTRAVENOUS | Status: DC | PRN
  Administered 2023-01-18 (×2): 80 ug via INTRAVENOUS

## 2023-01-18 MED ORDER — ROCURONIUM BROMIDE 10 MG/ML (PF) SYRINGE
PREFILLED_SYRINGE | INTRAVENOUS | Status: DC | PRN
  Administered 2023-01-18: 50 mg via INTRAVENOUS

## 2023-01-18 MED ORDER — PROPOFOL 10 MG/ML IV BOLUS
INTRAVENOUS | Status: DC | PRN
  Administered 2023-01-18: 130 mg via INTRAVENOUS

## 2023-01-18 MED ORDER — LACTATED RINGERS IV SOLN: INTRAVENOUS | Status: DC | PRN

## 2023-01-18 MED ORDER — LIDOCAINE 2% (20 MG/ML) 5 ML SYRINGE
INTRAMUSCULAR | Status: DC | PRN
  Administered 2023-01-18: 60 mg via INTRAVENOUS

## 2023-01-18 MED ORDER — DICLOFENAC SUPPOSITORY 100 MG
RECTAL | Status: DC | PRN
  Administered 2023-01-18: 100 mg via RECTAL

## 2023-01-18 MED ORDER — SODIUM CHLORIDE 0.9 % IV SOLN: INTRAVENOUS | Status: DC

## 2023-01-18 MED ORDER — SODIUM CHLORIDE 0.9 % IV SOLN
INTRAVENOUS | Status: AC
  Filled 2023-01-18: qty 8

## 2023-01-18 MED ORDER — INDOMETHACIN 50 MG RE SUPP: 100.0000 mg | Freq: Once | RECTAL | Status: DC

## 2023-01-18 MED ORDER — MIDAZOLAM HCL 2 MG/2ML IJ SOLN
INTRAMUSCULAR | Status: DC | PRN
  Administered 2023-01-18: 2 mg via INTRAVENOUS

## 2023-01-18 MED ORDER — SODIUM CHLORIDE 0.9 % IV SOLN
INTRAVENOUS | Status: DC | PRN
  Administered 2023-01-18: 30 mL

## 2023-01-18 MED ORDER — DEXAMETHASONE SODIUM PHOSPHATE 10 MG/ML IJ SOLN
INTRAMUSCULAR | Status: DC | PRN
  Administered 2023-01-18: 10 mg via INTRAVENOUS

## 2023-01-18 MED ORDER — SODIUM CHLORIDE 0.9 % IV SOLN
1.5000 g | Freq: Once | INTRAVENOUS | Status: AC
  Administered 2023-01-18: 3 g via INTRAVENOUS
  Filled 2023-01-18: qty 4

## 2023-01-18 MED ORDER — DICLOFENAC SUPPOSITORY 100 MG
RECTAL | Status: AC
  Filled 2023-01-18: qty 1

## 2023-01-18 MED ORDER — SUGAMMADEX SODIUM 200 MG/2ML IV SOLN
INTRAVENOUS | Status: DC | PRN
  Administered 2023-01-18: 150 mg via INTRAVENOUS

## 2023-01-18 MED ORDER — SODIUM CHLORIDE 0.9 % IV SOLN
1.5000 g | INTRAVENOUS | Status: DC
  Filled 2023-01-18: qty 4

## 2023-01-18 MED ORDER — ONDANSETRON HCL 4 MG/2ML IJ SOLN
INTRAMUSCULAR | Status: DC | PRN
  Administered 2023-01-18: 4 mg via INTRAVENOUS

## 2023-01-18 NOTE — Anesthesia Procedure Notes (Signed)
Procedure Name: Intubation Date/Time: 01/18/2023 11:51 AM  Performed by: Dairl Ponder, CRNAPre-anesthesia Checklist: Patient identified, Emergency Drugs available, Suction available and Patient being monitored Patient Re-evaluated:Patient Re-evaluated prior to induction Oxygen Delivery Method: Circle System Utilized Preoxygenation: Pre-oxygenation with 100% oxygen Induction Type: IV induction Ventilation: Mask ventilation without difficulty Laryngoscope Size: Mac and 4 Grade View: Grade I Tube type: Oral Tube size: 7.5 mm Number of attempts: 1 Airway Equipment and Method: Stylet and Oral airway Placement Confirmation: ETT inserted through vocal cords under direct vision, positive ETCO2 and breath sounds checked- equal and bilateral Secured at: 23 cm Tube secured with: Tape Dental Injury: Teeth and Oropharynx as per pre-operative assessment

## 2023-01-18 NOTE — Plan of Care (Signed)
Problem: Education: Goal: Knowledge of General Education information will improve Description: Including pain rating scale, medication(s)/side effects and non-pharmacologic comfort measures Outcome: Progressing Pt understands he was admitted for abdominal/ flank pain x1 month which has been worsening. He had a MRCP on Monday 9/30 revealing a pancreatic mass suspicious for pancreatic cancer. Pt is currently awaiting results from his bx. Per Labs pt's LFTs are elevated. GI has been consulted and had a ERCP today 01/18/2023.    Problem: Clinical Measurements: Goal: Will remain free from infection Outcome: Progressing S/Sx of infection monitored and assessed q-shift.  Pt has remained afebrile thus far.  He is on IV abx per MD's orders.  Pt had ERCP today 01/18/2023.      Problem: Clinical Measurements: Goal: Respiratory complications will improve Outcome: Progressing Respiratory status monitored and assessed q-shift.  Pt is on room air with PO2 at 96-98% and respiration rate of 16-18 breaths per minute.  Pt has not endorsed c/o SOB or DOE.    Problem: Clinical Measurements: Goal: Cardiovascular complication will be avoided Outcome: Progressing Pt's VS WNL thus far.    Problem: Activity: Goal: Risk for activity intolerance will decrease Outcome: Progressing Pt is independent of all his ADLs.  He can get up OOB with a steady gait independently.    Problem: Nutrition: Goal: Adequate nutrition will be maintained Outcome: Progressing Pt is on a currently NPO for an ERCP.  Prior to he was on a regular diet per MD's orders and can tolerate it w/o s/sx of abdominal pain/ distention or n/v.    Problem: Safety: Goal: Ability to remain free from injury will improve Outcome: Progressing Pt has remained free from falls thus far.  Instructed pt to utilize RN call light for assistance.  Hourly rounds performed.  Bed in lowest position, locked with two upper side rails engaged.  Belongings and call  light within reach.    Problem: Skin Integrity: Goal: Risk for impaired skin integrity will decrease Outcome: Progressing Skin integrity monitored and assessed q-shift.  Instructed pt to turn q2 hours to prevent further skin impairment.  Tubes and drains assessed for device related pressures sores.  Pt is continent of both bowel and bladder.

## 2023-01-18 NOTE — Progress Notes (Signed)
PROGRESS NOTE    Randy Allen  WUJ:[id number]  DOB: [birth year]  DOA: 01/16/2023 PCP: Lucky Cowboy, MD Outpatient Specialists:   Hospital course:  62 year old man was admitted with painless jaundice known 2.5 cm mass in head of pancreas consistent with adeno CA.   Subjective:  Patient states he feels okay, is somewhat anxious awaiting ERCP.  No physical complaints.   Objective: Vitals:   01/18/23 1313 01/18/23 1315 01/18/23 1330 01/18/23 1345  BP: (!) 150/96 (!) 125/98 (!) 152/95 (!) 148/101  Pulse: 77 75 70 70  Resp: 19  17 13   Temp: 97.7 F (36.5 C)   97.7 F (36.5 C)  TempSrc:      SpO2: 96% 95% 96% 95%  Weight:      Height:        Intake/Output Summary (Last 24 hours) at 01/18/2023 1729 Last data filed at 01/18/2023 1309 Gross per 24 hour  Intake 500 ml  Output 2 ml  Net 498 ml   Filed Weights   01/17/23 0108 01/18/23 1109  Weight: 71.2 kg 68.5 kg     Exam:  General: Relatively well-appearing man sitting up in bed in NAD Eyes: sclera anicteric, conjuctiva mild injection bilaterally CVS: S1-S2, regular  Respiratory:  decreased air entry bilaterally secondary to decreased inspiratory effort, rales at bases  GI: NABS, soft, NT  LE: Warm and well-perfused Neuro: A/O x 3,  grossly nonfocal.  Psych: patient is logical and coherent, judgement and insight appear normal, mood and affect appropriate to situation.  Data Reviewed:  Basic Metabolic Panel: Recent Labs  Lab 01/13/23 1600 01/14/23 1550 01/16/23 1827 01/17/23 0246 01/18/23 1626  NA 139 139 139 137 136  K 4.0 3.6 3.4* 3.6 3.4*  CL 101 100 103 104 103  CO2 28 31 22 24 26   GLUCOSE 98 118* 110* 104* 240*  BUN 12 11 10 9  5*  CREATININE 1.21 1.15 0.99 1.04 0.97  CALCIUM 9.4 9.8 9.6 8.8* 8.5*    CBC: Recent Labs  Lab 01/13/23 1600 01/14/23 1550 01/16/23 1827 01/18/23 1626  WBC 5.9 5.3 7.0 7.5  NEUTROABS 3,564 3.2  --   --   HGB 14.4 15.0 14.2 13.2  HCT 44.3 45.1 43.5 39.7   MCV 90.2 89.2 91.0 88.4  PLT 284 279.0 265 234     Scheduled Meds:  enalapril  10 mg Oral QHS   indomethacin  100 mg Rectal Once   levothyroxine  75 mcg Oral Q0600   Continuous Infusions:  0.9 % NaCl with KCl 20 mEq / L 75 mL/hr at 01/17/23 1415     Assessment & Plan:   Painless jaundice Pancreatic mass Plan is for ERCP today EUS if needed for further brushings Plan for discharge in a.m. per GI for outpatient follow-up of biopsy results  HTN Continue enalapril  Hypothyroidism Continue Synthroid    DVT prophylaxis: SCD Code Status: Full Family Communication: None today     Studies: DG ERCP  Result Date: 01/18/2023 CLINICAL DATA:  Bile duct obstruction. EXAM: ERCP TECHNIQUE: Multiple spot images obtained with the fluoroscopic device and submitted for interpretation post-procedure. FLUOROSCOPY TIME: FLUOROSCOPY TIME 4 minutes, 8 seconds (17.2 mGy) COMPARISON:  MRCP-01/15/2023; CT abdomen pelvis-01/10/2023 FINDINGS: Five spot intraoperative fluoroscopic images of the right upper abdominal quadrant during ERCP are provided for review. Initial image demonstrates ERCP probe overlying the right upper abdominal quadrant. There is selective cannulation and opacification of the common bile duct with narrowing/occlusion of its mid/distal aspect with potential shouldering  about the central aspect of the narrowing/occlusion There is minimal opacification of the intrahepatic biliary tree which appears moderately dilated. There is faint opacification of the cystic duct and gallbladder. There is no definitive opacification of the pancreatic duct Completion images demonstrate placement of a biliary stent overlying the expected location of the CBD. IMPRESSION: ERCP with biliary stent placement as above. These images were submitted for radiologic interpretation only. Please see the procedural report for the amount of contrast and the fluoroscopy time utilized. Electronically Signed   By: Simonne Come M.D.   On: 01/18/2023 15:26    Principal Problem:   Common bile duct (CBD) obstruction Active Problems:   Hypothyroidism   Hyperlipidemia   Mass of head of pancreas   Common bile duct obstruction   Jaundice   HTN (hypertension)   Pancreatic mass     Pieter Partridge, [facility]  If 7PM-7AM, please contact night-coverage [email or web address]   LOS: 2 days

## 2023-01-18 NOTE — Transfer of Care (Signed)
Immediate Anesthesia Transfer of Care Note  Patient: Esvin L Glaude  Procedure(s) Performed: ENDOSCOPIC RETROGRADE CHOLANGIOPANCREATOGRAPHY (ERCP) WITH PROPOFOL BILIARY STENT PLACEMENT BILIARY BRUSHING  Patient Location: PACU  Anesthesia Type:General  Level of Consciousness: awake, alert , and oriented  Airway & Oxygen Therapy: Patient Spontanous Breathing  Post-op Assessment: Report given to RN and Post -op Vital signs reviewed and stable  Post vital signs: Reviewed and stable  Last Vitals:  Vitals Value Taken Time  BP 125/98 01/18/23 1315  Temp    Pulse 76 01/18/23 1315  Resp 18 01/18/23 1315  SpO2 93 % 01/18/23 1315  Vitals shown include unfiled device data.  Last Pain:  Vitals:   01/18/23 1109  TempSrc: Temporal  PainSc: 5       Patients Stated Pain Goal: 2 (01/17/23 1420)  Complications: No notable events documented.

## 2023-01-18 NOTE — Interval H&P Note (Signed)
History and Physical Interval Note:  01/18/2023 11:46 AM  Randy Allen  has presented today for surgery, with the diagnosis of biliary obstruction, pancreatic mass.  The various methods of treatment have been discussed with the patient and family. After consideration of risks, benefits and other options for treatment, the patient has consented to  Procedure(s): ENDOSCOPIC RETROGRADE CHOLANGIOPANCREATOGRAPHY (ERCP) WITH PROPOFOL (N/A) as a surgical intervention.  The patient's history has been reviewed, patient examined, no change in status, stable for surgery.  I have reviewed the patient's chart and labs.  Questions were answered to the patient's satisfaction.     [name]

## 2023-01-18 NOTE — Op Note (Signed)
[facility] Patient Name: Randy Allen Procedure Date : 01/18/2023 MRN: [id number] Attending MD: Wilhemina Bonito. Marina Goodell , MD, [id number] Date of Birth: 04-21-1960 CSN: [id number] Age: 62 Admit Type: Inpatient Procedure:                ERCP with sphincterotomy, brushings, and biliary                            stent placement Indications:              Malignant stricture of the common bile duct Providers:                Wilhemina Bonito. Marina Goodell, MD, Doristine Mango, RN, Suzy Bouchard, RN, Rozetta Nunnery, Technician Referring MD:             [facility] Medicines:                General Anesthesia Complications:            No immediate complications. Estimated Blood Loss:     Estimated blood loss: none. Procedure:                Pre-Anesthesia Assessment:                           - Prior to the procedure, a History and Physical                            was performed, and patient medications and                            allergies were reviewed. The patient's tolerance of                            previous anesthesia was also reviewed. The risks                            and benefits of the procedure and the sedation                            options and risks were discussed with the patient.                            All questions were answered, and informed consent                            was obtained. Prior Anticoagulants: The patient has                            taken no anticoagulant or antiplatelet agents. ASA                            Grade Assessment: II - A patient with mild systemic  disease. After reviewing the risks and benefits,                            the patient was deemed in satisfactory condition to                            undergo the procedure. Preoperative antibiotics                            were provided. Postoperative NSAID rectal                            suppository provided                            After obtaining informed consent, the scope was                            passed under direct vision. Throughout the                            procedure, the patient's blood pressure, pulse, and                            oxygen saturations were monitored continuously. The                            [id number]-Q190V ([id number] Olympus duodenoscope was                            introduced through the mouth, and used to inject                            contrast into and used to inject contrast into the                            bile duct. The ERCP was accomplished without                            difficulty. The patient tolerated the procedure                            well. Scope In: Scope Out: Findings:      1. The scout film was normal.      2. The esophagus was successfully intubated but not visualized with the       side-viewing scope. The scope was advanced to a normal major papilla in       the descending duodenum without detailed examination of the pharynx,       larynx and associated structures, and upper GI tract. The upper GI tract       was grossly normal.      3. The bile duct was deeply cannulated with the sphincterotome. Contrast       was injected. I personally interpreted the bile duct images. The lower       third of  the main bile duct contained a single stenosis that began       approximately 2 cm above a normal most distal common duct. Stricture       measured almost 2 cm in length. There was diffuse upstream dilation.      4. The stricture was brushed multiple times and the specimens were sent       for cytology.      5. Biliary sphincterotomy was made with a traction (standard)       sphincterotome using ERBE electrocautery. There was no       post-sphincterotomy bleeding.      6. 10 Fr by 7 cm transpapillary plastic stents were placed into the       common bile duct. The stents were in good position. Excellent drainage.       See images      7. No  injection or manipulation of the pancreatic duct Impression:               - A malignant appearing single biliary stricture                            was found in the lower third of the main bile duct                            as described.                           - A biliary sphincterotomy was performed.                           - The stricture was brushed                           - Plastic stent was placed into the common bile                            duct. Recommendation:           1. Standard post ERCP care                           2. Trend liver tests                           3. Follow-up cytology.                           4. Full liquid diet                           5. If doing well, he could be discharged in the                            morning.                           6. Further plans pending results of cytology  Discussed with the patient and his wife. They were                            provided a copy of this report. Procedure Code(s):        --- Professional ---                           [phone], Endoscopic retrograde                            cholangiopancreatography (ERCP); with placement of                            endoscopic stent into biliary or pancreatic duct,                            including pre- and post-dilation and guide wire                            passage, when performed, including sphincterotomy,                            when performed, each stent Diagnosis Code(s):        --- Professional ---                           K83.1, Obstruction of bile duct CPT copyright 2022 American [employer] Association. All rights reserved. The codes documented in this report are preliminary and upon coder review may  be revised to meet current compliance requirements. Wilhemina Bonito. Marina Goodell, MD 01/18/2023 1:15:48 PM This report has been signed electronically. Number of Addenda: 0

## 2023-01-18 NOTE — Anesthesia Postprocedure Evaluation (Signed)
Anesthesia Post Note  Patient: Randy Allen  Procedure(s) Performed: ENDOSCOPIC RETROGRADE CHOLANGIOPANCREATOGRAPHY (ERCP) WITH PROPOFOL BILIARY STENT PLACEMENT BILIARY BRUSHING SPHINCTEROTOMY     Patient location during evaluation: PACU Anesthesia Type: General Level of consciousness: awake and alert Pain management: pain level controlled Vital Signs Assessment: post-procedure vital signs reviewed and stable Respiratory status: spontaneous breathing, nonlabored ventilation and respiratory function stable Cardiovascular status: blood pressure returned to baseline and stable Postop Assessment: no apparent nausea or vomiting Anesthetic complications: no  No notable events documented.  Last Vitals:  Vitals:   01/18/23 1330 01/18/23 1345  BP: (!) 152/95 (!) 148/101  Pulse: 70 70  Resp: 17 13  Temp:  36.5 C  SpO2: 96% 95%    Last Pain:  Vitals:   01/18/23 1409  TempSrc:   PainSc: 6                  Shalena Ezzell,W. EDMOND

## 2023-01-18 NOTE — Progress Notes (Signed)
Agree with assessment/plan.  Raj Florestine Carmical, MD [facility] [phone]  

## 2023-01-18 NOTE — Anesthesia Preprocedure Evaluation (Addendum)
Anesthesia Evaluation  Patient identified by MRN, date of birth, ID band Patient awake    Reviewed: Allergy & Precautions, H&P , NPO status , Patient's Chart, lab work & pertinent test results  Airway Mallampati: II  TM Distance: >3 FB Neck ROM: Full    Dental no notable dental hx. (+) Teeth Intact, Dental Advisory Given   Pulmonary neg pulmonary ROS   Pulmonary exam normal breath sounds clear to auscultation       Cardiovascular hypertension, Pt. on medications + Valvular Problems/Murmurs AS  Rhythm:Regular Rate:Normal + Systolic murmurs    Neuro/Psych negative neurological ROS  negative psych ROS   GI/Hepatic negative GI ROS, Neg liver ROS,,,  Endo/Other  Hypothyroidism  Pancreatic Mass  Renal/GU negative Renal ROS  negative genitourinary   Musculoskeletal   Abdominal   Peds  Hematology negative hematology ROS (+)   Anesthesia Other Findings   Reproductive/Obstetrics negative OB ROS                             Anesthesia Physical Anesthesia Plan  ASA: 3  Anesthesia Plan: General   Post-op Pain Management: Minimal or no pain anticipated   Induction: Intravenous  PONV Risk Score and Plan: 3 and Ondansetron, Dexamethasone and Midazolam  Airway Management Planned: Oral ETT  Additional Equipment:   Intra-op Plan:   Post-operative Plan: Extubation in OR  Informed Consent: I have reviewed the patients History and Physical, chart, labs and discussed the procedure including the risks, benefits and alternatives for the proposed anesthesia with the patient or authorized representative who has indicated his/her understanding and acceptance.     Dental advisory given  Plan Discussed with: CRNA  Anesthesia Plan Comments:        Anesthesia Quick Evaluation

## 2023-01-19 DIAGNOSIS — R935 Abnormal findings on diagnostic imaging of other abdominal regions, including retroperitoneum: Secondary | ICD-10-CM

## 2023-01-19 DIAGNOSIS — K831 Obstruction of bile duct: Secondary | ICD-10-CM | POA: Diagnosis not present

## 2023-01-19 LAB — CBC
HCT: 38.8 % — ABNORMAL LOW (ref 39.0–52.0)
Hemoglobin: 12.9 g/dL — ABNORMAL LOW (ref 13.0–17.0)
MCH: 29.3 pg (ref 26.0–34.0)
MCHC: 33.2 g/dL (ref 30.0–36.0)
MCV: 88 fL (ref 80.0–100.0)
Platelets: 234 10*3/uL (ref 150–400)
RBC: 4.41 MIL/uL (ref 4.22–5.81)
RDW: 14.9 % (ref 11.5–15.5)
WBC: 11 10*3/uL — ABNORMAL HIGH (ref 4.0–10.5)
nRBC: 0 % (ref 0.0–0.2)

## 2023-01-19 LAB — COMPREHENSIVE METABOLIC PANEL
ALT: 309 U/L — ABNORMAL HIGH (ref 0–44)
AST: 176 U/L — ABNORMAL HIGH (ref 15–41)
Albumin: 2.6 g/dL — ABNORMAL LOW (ref 3.5–5.0)
Alkaline Phosphatase: 603 U/L — ABNORMAL HIGH (ref 38–126)
Anion gap: 8 (ref 5–15)
BUN: 8 mg/dL (ref 8–23)
CO2: 24 mmol/L (ref 22–32)
Calcium: 8.8 mg/dL — ABNORMAL LOW (ref 8.9–10.3)
Chloride: 103 mmol/L (ref 98–111)
Creatinine, Ser: 0.94 mg/dL (ref 0.61–1.24)
GFR, Estimated: >60 mL/min (ref >60)
Glucose, Bld: 226 mg/dL — ABNORMAL HIGH (ref 70–99)
Potassium: 3.8 mmol/L (ref 3.5–5.1)
Sodium: 135 mmol/L (ref 135–145)
Total Bilirubin: 4.2 mg/dL — ABNORMAL HIGH (ref 0.3–1.2)
Total Protein: 5.8 g/dL — ABNORMAL LOW (ref 6.5–8.1)

## 2023-01-19 LAB — GLUCOSE, CAPILLARY: Glucose-Capillary: 161 mg/dL — ABNORMAL HIGH (ref 70–99)

## 2023-01-19 NOTE — Progress Notes (Addendum)
Daily Progress Note  DOA: 01/16/2023 Hospital Day: 4  Chief Complaint: obstructive jaundice / pancreatic mass  ASSESSMENT    Brief Narrative:  Randy Allen is a 62 y.o. year old male with a history of  HTN, HL, GERD, hypothyroidism bicuspid AV, ascending aortic aneurysm. Recently diagnosed pancreatic mass after presenting with obstructive jaundice and abdominal pain. Was to get ERCP but came to ED with worsening pain.    Biliary stricture  / pancreatic head mass  ERCP with findings of malignant appearing biliary stricture and lower third of main bile duct, status post sphincterotomy, brushings and plastic stent placement    Principal Problem:   Common bile duct (CBD) obstruction Active Problems:   Hypothyroidism   Hyperlipidemia   Mass of head of pancreas   Common bile duct obstruction   Jaundice   HTN (hypertension)   Pancreatic mass     PLAN   Our office will contact patient with results of brushing and then likely needs EUS ( +/- FNA depending on results of brushings)   Subjective   Feels fine. No significant abdominal pain. Back pain is better. Wants to go home   Objective   ERCP  01/18/2023 - Randy Pies N. Marina Goodell, MD   - A malignant appearing single biliary stricture was found in the lower third of the main bile duct as described. A biliary sphincterotomy was performed. - The stricture was brushed - Plastic stent was placed into the common bile duct.   Recent Labs    01/16/23 1827 01/18/23 1626 01/19/23 0830  WBC 7.0 7.5 11.0*  HGB 14.2 13.2 12.9*  HCT 43.5 39.7 38.8*  PLT 265 234 234   BMET Recent Labs    01/16/23 1827 01/17/23 0246 01/18/23 1626  NA 139 137 136  K 3.4* 3.6 3.4*  CL 103 104 103  CO2 22 24 26   GLUCOSE 110* 104* 240*  BUN 10 9 5*  CREATININE 0.99 1.04 0.97  CALCIUM 9.6 8.8* 8.5*   LFT Recent Labs    01/18/23 1626  PROT 6.1*  ALBUMIN 2.8*  AST 271*  ALT 362*  ALKPHOS 682*  BILITOT 8.0*   PT/INR Recent Labs     01/17/23 0246  LABPROT 14.4  INR 1.1     Imaging:  DG ERCP CLINICAL DATA:  Bile duct obstruction.  EXAM: ERCP  TECHNIQUE: Multiple spot images obtained with the fluoroscopic device and submitted for interpretation post-procedure.  FLUOROSCOPY TIME: FLUOROSCOPY TIME 4 minutes, 8 seconds (17.2 mGy)  COMPARISON:  MRCP-01/15/2023; CT abdomen pelvis-01/10/2023  FINDINGS: Five spot intraoperative fluoroscopic images of the right upper abdominal quadrant during ERCP are provided for review.  Initial image demonstrates ERCP probe overlying the right upper abdominal quadrant. There is selective cannulation and opacification of the common bile duct with narrowing/occlusion of its mid/distal aspect with potential shouldering about the central aspect of the narrowing/occlusion  There is minimal opacification of the intrahepatic biliary tree which appears moderately dilated. There is faint opacification of the cystic duct and gallbladder. There is no definitive opacification of the pancreatic duct  Completion images demonstrate placement of a biliary stent overlying the expected location of the CBD.  IMPRESSION: ERCP with biliary stent placement as above.  These images were submitted for radiologic interpretation only. Please see the procedural report for the amount of contrast and the fluoroscopy time utilized.  Electronically Signed   By: Simonne Come M.D.   On: 01/18/2023 15:26     Scheduled inpatient medications:  enalapril  10 mg Oral QHS   indomethacin  100 mg Rectal Once   levothyroxine  75 mcg Oral Q0600   Continuous inpatient infusions:   0.9 % NaCl with KCl 20 mEq / L 75 mL/hr at 01/17/23 1415   PRN inpatient medications: HYDROmorphone (DILAUDID) injection, lactulose, ondansetron (ZOFRAN) IV, oxyCODONE  Vital signs in last 24 hours: Temp:  [97.6 F (36.4 C)-98.9 F (37.2 C)] 97.7 F (36.5 C) (10/06 0727) Pulse Rate:  [59-80] 66 (10/06 0727) Resp:   [13-19] 16 (10/06 0727) BP: (125-152)/(78-101) 133/90 (10/06 0727) SpO2:  [95 %-97 %] 96 % (10/06 0727) Weight:  [68.5 kg] 68.5 kg (10/05 1109) Last BM Date : 01/18/23  Intake/Output Summary (Last 24 hours) at 01/19/2023 0922 Last data filed at 01/18/2023 1309 Gross per 24 hour  Intake 500 ml  Output 2 ml  Net 498 ml    Intake/Output from previous day: 10/05 0701 - 10/06 0700 In: 500 [I.V.:500] Out: 2 [Blood:2] Intake/Output this shift: No intake/output data recorded.   Physical Exam:  General: Alert male in NAD Heart:  Regular rate and rhythm.  Pulmonary: Normal respiratory effort Abdomen: Soft, nondistended, nontender. Normal bowel sounds. Extremities: No lower extremity edema  Neurologic: Alert and oriented Psych: Pleasant. Cooperative. Insight appears normal.      LOS: 3 days   Randy Allen ,NP 01/19/2023, 9:22 AM  GI ATTENDING  Interval history data reviewed.  Agree with interval progress note.  Patient doing well post ERCP with stent placement.  Liver tests improving.  He is ready for discharge.  Will follow-up on cytology and be in touch with the patient regarding the next steps in his management.  Randy Allen. Randy Allen., M.D. [facility] Division of Gastroenterology

## 2023-01-19 NOTE — Discharge Summary (Signed)
Randy Allen ZOX:[id number] DOB: 04-23-1960 DOA: 01/16/2023  PCP: Lucky Cowboy, MD  Admit date: 01/16/2023  Discharge date: 01/19/2023  Admitted From: Home   disposition: Home   Recommendations for Outpatient Follow-up:   Follow up with PCP in 1-2 days to follow up on your blood sugar F/u with GI for biopsy results.  Avoid simple starches till you see your PCP   Home Health: N/A Equipment/Devices: N/A Consultations: Gastroenterology Discharge Condition: Stable CODE STATUS: Full Diet Recommendation: Modified  Diet Order             Diet Carb Modified           Diet full liquid Room service appropriate? Yes; Fluid consistency: Thin  Diet effective now                    Chief Complaint  Patient presents with   Abdominal Pain     Brief history of present illness from the day of admission and additional interim summary     Expand All Collapse All      Attending physician's note     62 year old male with medical history as outlined below, seen earlier this week in the GI clinic for evaluation of elevated liver enzymes, epigastric pain, unintentional weight loss.  He states he started having abdominal pain in June, which abated for the most part through the summer and started again in September.  Has lost approximately 15#.  Started noticing darkening urine and epigastric pain over the last 2 weeks or so, then friends noticed jaundiced complexion last week.   Recent evaluation notable for the following: - 01/15/2023: MRI/MRCP 2.6 x 2.4 cm hypoenhancing mass in the central pancreatic head which effaces the CBD and PD.  Mass contacts the adjacent central SMV and portal confluence with less than 180 degrees of contact.  Preserved fat plane to the adjacent SMA.  CBD dilation up to 1.1 cm and abruptly  truncated within the superior pancreatic head.  No lymphadenopathy - 01/17/2023: AST/ALT 293/372, ALP 608, T. bili 8.9 - Normal PT/INR, CBC, lipase                                                                    Hospital Course   Patient was admitted for workup of painless jaundice and pancreatic mass.  He underwent ERCP without complication.  Results of ERCP as below:   - A malignant appearing single biliary stricture was found in the lower third of the main bile duct                            - A biliary sphincterotomy was performed.                           -  The stricture was brushed                           - Plastic stent was placed into the common bile                            duct.  Patient's hospital course was notable for elevation in blood sugars 200 in a.m. fasting and 240 postprandially.  Patient denies any previous history of elevated blood sugar or diabetes.  Patient denied any polydipsia or polyuria or unusual fatigue.  Patient's blood sugar was 141 prior to discharge.  Patient is to follow-up with his PCP in a couple of days to follow his blood sugar.  He is to stay on a relatively low carbohydrate diet until he sees his PCP.  Patient is to follow-up with gastroenterology for biopsy results per their recommendations.   Discharge diagnosis     Principal Problem:   Common bile duct (CBD) obstruction Active Problems:   Hypothyroidism   Hyperlipidemia   Mass of head of pancreas   Common bile duct obstruction   Jaundice   HTN (hypertension)   Pancreatic mass    Discharge instructions    Discharge Instructions     Diet Carb Modified   Complete by: As directed    Discharge instructions   Complete by: As directed    As we discussed, please see your PCP in the next couple of days to monitor your blood sugar. Stay away from simple starches until you see your PCP. You will need to follow up with Gastroenterology as an outpatient to get the results of your  biopsy.   Increase activity slowly   Complete by: As directed        Discharge Medications   Allergies as of 01/19/2023       Reactions   Propofol Shortness Of Breath   CHEST TIGHTNESS _Patient stated he was told this medication was "pushed to fast" and that is what caused his symptoms   Zoloft [sertraline Hcl] Other (See Comments)   HYPERACTIVE   Quinolones    Aortic aneurysm        Medication List     TAKE these medications    aspirin EC 81 MG tablet Take 81 mg by mouth at bedtime.   atorvastatin 40 MG tablet Commonly known as: LIPITOR Take  1 tablet  Daily  for Cholesterol What changed:  how much to take how to take this when to take this additional instructions   enalapril 10 MG tablet Commonly known as: VASOTEC take 1 tablet by mouth daily for blood pressure What changed:  how much to take how to take this when to take this   famotidine 40 MG tablet Commonly known as: PEPCID take 1 tablet by mouth daily for heart burn and acid indigestion   levothyroxine 75 MCG tablet Commonly known as: SYNTHROID Take  1 tablet  Daily  on an empty stomach with only water for 30 minutes & no Antacid meds, Calcium or Magnesium for 4 hours & avoid Biotin What changed: additional instructions   multivitamin tablet Take 1 tablet by mouth daily. Centrum Silver          Major procedures and Radiology Reports - PLEASE review detailed and final reports thoroughly  -       DG ERCP  Result Date: 01/18/2023 CLINICAL DATA:  Bile duct obstruction. EXAM: ERCP TECHNIQUE:  Multiple spot images obtained with the fluoroscopic device and submitted for interpretation post-procedure. FLUOROSCOPY TIME: FLUOROSCOPY TIME 4 minutes, 8 seconds (17.2 mGy) COMPARISON:  MRCP-01/15/2023; CT abdomen pelvis-01/10/2023 FINDINGS: Five spot intraoperative fluoroscopic images of the right upper abdominal quadrant during ERCP are provided for review. Initial image demonstrates ERCP probe overlying  the right upper abdominal quadrant. There is selective cannulation and opacification of the common bile duct with narrowing/occlusion of its mid/distal aspect with potential shouldering about the central aspect of the narrowing/occlusion There is minimal opacification of the intrahepatic biliary tree which appears moderately dilated. There is faint opacification of the cystic duct and gallbladder. There is no definitive opacification of the pancreatic duct Completion images demonstrate placement of a biliary stent overlying the expected location of the CBD. IMPRESSION: ERCP with biliary stent placement as above. These images were submitted for radiologic interpretation only. Please see the procedural report for the amount of contrast and the fluoroscopy time utilized. Electronically Signed   By: Simonne Come M.D.   On: 01/18/2023 15:26   MR ABDOMEN MRCP W WO CONTAST  Result Date: 01/15/2023 CLINICAL DATA:  Epigastric pain, flank pain elevated LFTs, jaundice EXAM: MRI ABDOMEN WITHOUT AND WITH CONTRAST (INCLUDING MRCP) TECHNIQUE: Multiplanar multisequence MR imaging of the abdomen was performed both before and after the administration of intravenous contrast. Heavily T2-weighted images of the biliary and pancreatic ducts were obtained, and three-dimensional MRCP images were rendered by post processing. CONTRAST:  7mL GADAVIST GADOBUTROL 1 MMOL/ML IV SOLN COMPARISON:  CT abdomen pelvis, 01/10/2023 FINDINGS: Lower chest: No acute abnormality. Hepatobiliary: No solid liver abnormality is seen. Distended gallbladder containing sludge (series 3, image 7). Mild intra and extrahepatic biliary ductal dilatation, the common bile duct measuring up to 1.1 cm and abruptly truncated within the superior pancreatic head, although subtly reconstituted inferiorly within the pancreatic head (series 3, image 16, series 3, image 15). Pancreas: Hypoenhancing mass within the central pancreatic head, which effaces the common bile duct  and pancreatic ducts, measuring 2.6 x 2.4 cm (series 16, image 48). The remainder of the pancreas is diffusely atrophic, with dilatation of the main pancreatic duct up to 0.6 cm (series 4, image 15). Mass contacts the adjacent central superior mesenteric vein and portal confluence with less than 180 degrees of contact (series 18, image 51). Preserved fat plane to the adjacent superior mesenteric artery. Spleen: Normal in size without significant abnormality. Adrenals/Urinary Tract: Adrenal glands are unremarkable. Simple, benign bilateral renal cortical and parapelvic cysts, requiring no specific further follow-up or characterization. Kidneys are otherwise normal, without obvious renal calculi, solid lesion, or hydronephrosis. Stomach/Bowel: Stomach is within normal limits. No evidence of bowel wall thickening, distention, or inflammatory changes. Vascular/Lymphatic: No significant vascular findings are present. No enlarged abdominal lymph nodes. Other: No abdominal wall hernia or abnormality. No ascites. Musculoskeletal: No acute or significant osseous findings. IMPRESSION: 1. Hypoenhancing mass within the central pancreatic head, which effaces the common bile duct and pancreatic ducts, measuring 2.6 x 2.4 cm. 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 2. Mass contacts the adjacent central superior mesenteric vein and portal confluence with less than 180 degrees of contact. Preserved fat plane to the adjacent superior mesenteric artery. 3. Distended gallbladder containing sludge. 4. No evidence of lymphadenopathy or metastatic disease in the abdomen. These results will be called to the ordering clinician or representative by the Radiologist Assistant, and communication documented in the PACS or [employer]. Electronically Signed   By: Jearld Lesch M.D.   On: 01/15/2023 16:06  MR 3D Recon At Scanner  Result Date: 01/15/2023 CLINICAL DATA:  Epigastric pain, flank pain elevated LFTs, jaundice  EXAM: MRI ABDOMEN WITHOUT AND WITH CONTRAST (INCLUDING MRCP) TECHNIQUE: Multiplanar multisequence MR imaging of the abdomen was performed both before and after the administration of intravenous contrast. Heavily T2-weighted images of the biliary and pancreatic ducts were obtained, and three-dimensional MRCP images were rendered by post processing. CONTRAST:  7mL GADAVIST GADOBUTROL 1 MMOL/ML IV SOLN COMPARISON:  CT abdomen pelvis, 01/10/2023 FINDINGS: Lower chest: No acute abnormality. Hepatobiliary: No solid liver abnormality is seen. Distended gallbladder containing sludge (series 3, image 7). Mild intra and extrahepatic biliary ductal dilatation, the common bile duct measuring up to 1.1 cm and abruptly truncated within the superior pancreatic head, although subtly reconstituted inferiorly within the pancreatic head (series 3, image 16, series 3, image 15). Pancreas: Hypoenhancing mass within the central pancreatic head, which effaces the common bile duct and pancreatic ducts, measuring 2.6 x 2.4 cm (series 16, image 48). The remainder of the pancreas is diffusely atrophic, with dilatation of the main pancreatic duct up to 0.6 cm (series 4, image 15). Mass contacts the adjacent central superior mesenteric vein and portal confluence with less than 180 degrees of contact (series 18, image 51). Preserved fat plane to the adjacent superior mesenteric artery. Spleen: Normal in size without significant abnormality. Adrenals/Urinary Tract: Adrenal glands are unremarkable. Simple, benign bilateral renal cortical and parapelvic cysts, requiring no specific further follow-up or characterization. Kidneys are otherwise normal, without obvious renal calculi, solid lesion, or hydronephrosis. Stomach/Bowel: Stomach is within normal limits. No evidence of bowel wall thickening, distention, or inflammatory changes. Vascular/Lymphatic: No significant vascular findings are present. No enlarged abdominal lymph nodes. Other: No  abdominal wall hernia or abnormality. No ascites. Musculoskeletal: No acute or significant osseous findings. IMPRESSION: 1. Hypoenhancing mass within the central pancreatic head, which effaces the common bile duct and pancreatic ducts, measuring 2.6 x 2.4 cm. 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 2. Mass contacts the adjacent central superior mesenteric vein and portal confluence with less than 180 degrees of contact. Preserved fat plane to the adjacent superior mesenteric artery. 3. Distended gallbladder containing sludge. 4. No evidence of lymphadenopathy or metastatic disease in the abdomen. These results will be called to the ordering clinician or representative by the Radiologist Assistant, and communication documented in the PACS or [employer]. Electronically Signed   By: Jearld Lesch M.D.   On: 01/15/2023 16:06   CT ABDOMEN PELVIS W CONTRAST  Result Date: 01/13/2023 CLINICAL DATA:  Bowel movement changes White colored stool Dark-colored urine Elevated liver enzymes EXAM: CT ABDOMEN AND PELVIS WITH CONTRAST TECHNIQUE: Multidetector CT imaging of the abdomen and pelvis was performed using the standard protocol following bolus administration of intravenous contrast. RADIATION DOSE REDUCTION: This exam was performed according to the departmental dose-optimization program which includes automated exposure control, adjustment of the mA and/or kV according to patient size and/or use of iterative reconstruction technique. CONTRAST:  ISOVUE-300 IOPAMIDOL (ISOVUE-300) INJECTION 61% COMPARISON:  07/18/2020 FINDINGS: Lower chest: No acute abnormality. Hepatobiliary: No focal liver abnormality is seen. No gallstones, gallbladder wall thickening, or biliary dilatation. Pancreas: Unremarkable. No pancreatic ductal dilatation or surrounding inflammatory changes. Spleen: Normal in size without focal abnormality. Adrenals/Urinary Tract: Subcentimeter hypodensity in the lower pole of the left  kidney is too small to fully characterize, however most likely represents a simple cyst and does not require dedicated imaging surveillance. Adrenal glands, kidneys, ureters, and bladder otherwise normal. Stomach/Bowel: Stomach is  within normal limits. Appendix appears normal. No evidence of bowel wall thickening, distention, or inflammatory changes. Vascular/Lymphatic: No significant vascular findings are present. No enlarged abdominal or pelvic lymph nodes. Reproductive: Prostate is unremarkable. Other: No abdominal wall hernia or abnormality. No abdominopelvic ascites. Musculoskeletal: No acute or significant osseous findings. IMPRESSION: No acute findings in the abdomen or pelvis. Electronically Signed   By: Acquanetta Belling M.D.   On: 01/13/2023 12:07    Micro Results    No results found for this or any previous visit (from the past 240 hour(s)).  Today   Subjective    Randy Allen feels much improved since admission.  Feels ready to go home.  Denies chest pain, shortness of breath or abdominal pain.  Feels they can take care of themselves with the resources they have at home.  Objective   Blood pressure (!) 133/90, pulse 66, temperature 97.7 F (36.5 C), temperature source Oral, resp. rate 16, height 5\' 8"  (1.727 m), weight 68.5 kg, SpO2 96%.   Intake/Output Summary (Last 24 hours) at 01/19/2023 1214 Last data filed at 01/18/2023 1309 Gross per 24 hour  Intake 500 ml  Output 2 ml  Net 498 ml    Exam General: Patient appears well and in good spirits sitting up in bed in no acute distress.  Eyes: sclera anicteric, conjuctiva mild injection bilaterally CVS: S1-S2, regular  Respiratory:  decreased air entry bilaterally secondary to decreased inspiratory effort, rales at bases  GI: NABS, soft, NT  LE: No edema.  Neuro: A/O x 3, Moving all extremities equally with normal strength, CN 3-12 intact, grossly nonfocal.  Psych: patient is logical and coherent, judgement and insight appear  normal, mood and affect appropriate to situation.    Data Review   CBC w Diff:  Lab Results  Component Value Date   WBC 11.0 (H) 01/19/2023   HGB 12.9 (L) 01/19/2023   HCT 38.8 (L) 01/19/2023   PLT 234 01/19/2023   LYMPHOPCT 23.6 01/14/2023   MONOPCT 10.4 01/14/2023   EOSPCT 5.6 (H) 01/14/2023   BASOPCT 1.1 01/14/2023    CMP:  Lab Results  Component Value Date   NA 135 01/19/2023   NA 141 09/17/2019   K 3.8 01/19/2023   CL 103 01/19/2023   CO2 24 01/19/2023   BUN 8 01/19/2023   BUN 15 09/17/2019   CREATININE 0.94 01/19/2023   CREATININE 1.21 01/13/2023   PROT 5.8 (L) 01/19/2023   ALBUMIN 2.6 (L) 01/19/2023   BILITOT 4.2 (H) 01/19/2023   ALKPHOS 603 (H) 01/19/2023   AST 176 (H) 01/19/2023   ALT 309 (H) 01/19/2023  .   Total Time in preparing paper work, data evaluation and todays exam - 35 minutes  Pieter Partridge M.D on 01/19/2023 at 12:14 PM  [facility]

## 2023-01-20 ENCOUNTER — Encounter: Payer: Self-pay | Admitting: Nurse Practitioner

## 2023-01-20 ENCOUNTER — Ambulatory Visit: Payer: BC Managed Care – PPO | Admitting: Nurse Practitioner

## 2023-01-20 VITALS — BP 128/84 | HR 68 | Temp 98.0°F | Ht 68.0 in | Wt 157.2 lb

## 2023-01-20 DIAGNOSIS — Z79899 Other long term (current) drug therapy: Secondary | ICD-10-CM | POA: Diagnosis not present

## 2023-01-20 DIAGNOSIS — R7309 Other abnormal glucose: Secondary | ICD-10-CM

## 2023-01-20 MED ORDER — LANCET DEVICE MISC
1.0000 | Freq: Three times a day (TID) | 0 refills | Status: AC
Start: 2023-01-20 — End: 2023-02-19

## 2023-01-20 MED ORDER — BLOOD GLUCOSE MONITORING SUPPL DEVI: 1.0000 | Freq: Three times a day (TID) | 0 refills | Status: DC

## 2023-01-20 MED ORDER — BLOOD GLUCOSE TEST VI STRP
1.0000 | ORAL_STRIP | Freq: Three times a day (TID) | 0 refills | Status: AC
Start: 2023-01-20 — End: 2023-02-22

## 2023-01-20 MED ORDER — LANCETS MISC. MISC
1.0000 | Freq: Three times a day (TID) | 0 refills | Status: AC
Start: 2023-01-20 — End: 2023-02-19

## 2023-01-20 NOTE — Progress Notes (Signed)
Assessment and Plan:  Randy Allen was seen today for an .  Diagnoses and all order for this visit:  Abnormal glucose Start to monitor BG - aim for goal of 120-150. Discussed starting Semaglutide/Rybelsus if BG remains elevated  Diabetic education with BG POC teaching provided. Start to implement diabetic diet.  All questions and concerns addressed.   - Blood Glucose Monitoring Suppl DEVI; 1 each by Does not apply route in the morning, at noon, and at bedtime. May substitute to any manufacturer covered by patient's insurance.  Dispense: 1 each; Refill: 0 - Glucose Blood (BLOOD GLUCOSE TEST STRIPS) STRP; 1 each by In Vitro route in the morning, at noon, and at bedtime. May substitute to any manufacturer covered by patient's insurance.  Dispense: 100 strip; Refill: 0 - Lancet Device MISC; 1 each by Does not apply route in the morning, at noon, and at bedtime. May substitute to any manufacturer covered by patient's insurance.  Dispense: 1 each; Refill: 0 - Lancets Misc. MISC; 1 each by Does not apply route in the morning, at noon, and at bedtime. May substitute to any manufacturer covered by patient's insurance.  Dispense: 100 each; Refill: 0  Medication management All medications discussed and reviewed in full. All questions and concerns regarding medications addressed.     Notify office for further evaluation and treatment, questions or concerns if s/s fail to improve. The risks and benefits of my recommendations, as well as other treatment options were discussed with the patient today. Questions were answered.  Further disposition pending results of labs. Discussed med's effects and SE's.    Over 20 minutes of exam, counseling, chart review, and critical decision making was performed.   Future Appointments  Date Time Provider Department Center  01/27/2023  1:40 PM Ladene Artist, MD [facility] None  02/25/2023 12:20 PM [facility] 1 [facility]  02/25/2023  1:30 PM  Loreli Slot, MD [facility]  04/30/2023  4:00 PM Adela Glimpse, NP [facility] None  08/14/2023  3:00 PM Lucky Cowboy, MD [facility] None    ------------------------------------------------------------------------------------------------------------------   HPI BP 128/84   Pulse 68   Temp 98 F (36.7 C)   Ht 5\' 8"  (1.727 m)   Wt 157 lb 3.2 oz (71.3 kg)   SpO2 99%   BMI 23.90 kg/m   62 y.o.male presents for follow up after hospital admission for pancreatitic mass and elevated blood glucose.  He as instructed to avoid simple starches which he reports changing his diet to a diabetic diet since discharge; avoiding carbohydrates.  He is continuing to following with GI.  He was seen and evaluated with GI for elevated liver enzymes, epigastric pain and unintentional weight loss.  He was admitted for increase in darkening urine and epigastric pain along with jaundice.  He had MRI/MRCP 01/15/23 that revealed a 2.6 x 2.4 cm hypoenhancing mass in the central pancreatic head which effaces the CBD and PD.  Mass contacts the adjacent central SMV and portal confluence with <180 degrees of contact.  Preserved fat place to the adjacent SMA.  CBD dilation up to 1.1 cm and abruptly truncated w/in the superior pancreatic head.  No lymphadenopathy.  was admitted for workup of painless jaundice and pancreatic mass.  He underwent ERCP without complication.  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 A biliary sphincterotomy was performed. The stricture was brushed.  Plastic stent was placed into the common bile duct.  Patient's hospital course was notable for elevation in blood sugars 200 in a.m. fasting and 240 postprandially.  Patient's blood sugar was 141 prior to discharge.    He continues to deny any polydipsia or polyuria or unusual fatigue.   Past Medical History:  Diagnosis Date   Dental crowns present    Heart murmur    states  need for antibiotics prior to dental procedures; no cardiologist   High blood cholesterol    History of hepatitis A    age 62   History of MRSA infection 04/15/2013   right great toe   Hyperlipidemia    Hypertension    states under control with med., has been on med. > 10 yr.   Hypothyroidism    Mass of skin of right shoulder 04/16/2015   Neck mass 04/16/2015   posterior   Seasonal allergies    Vitamin D deficiency      Allergies  Allergen Reactions   Propofol Shortness Of Breath    CHEST TIGHTNESS _Patient stated he was told this medication was "pushed to fast" and that is what caused his symptoms   Zoloft [Sertraline Hcl] Other (See Comments)    HYPERACTIVE   Quinolones     Aortic aneurysm    Current Outpatient Medications on File Prior to Visit  Medication Sig   aspirin EC 81 MG tablet Take 81 mg by mouth at bedtime.   atorvastatin (LIPITOR) 40 MG tablet Take  1 tablet  Daily  for Cholesterol (Patient taking differently: Take 20 mg by mouth at bedtime.)   enalapril (VASOTEC) 10 MG tablet take 1 tablet by mouth daily for blood pressure (Patient taking differently: Take 10 mg by mouth at bedtime. take 1 tablet by mouth daily for blood pressure)   famotidine (PEPCID) 40 MG tablet take 1 tablet by mouth daily for heart burn and acid indigestion   levothyroxine (SYNTHROID) 75 MCG tablet Take  1 tablet  Daily  on an empty stomach with only water for 30 minutes & no Antacid meds, Calcium or Magnesium for 4 hours & avoid Biotin (Patient taking differently: 1.5 tablets by mouth Monday Wednesday Friday. 1 tablet by mouth all other days)   Multiple Vitamin (MULTIVITAMIN) tablet Take 1 tablet by mouth daily. Centrum Silver   No current facility-administered medications on file prior to visit.    ROS: all negative except what is noted in the HPI.   Physical Exam:  BP 128/84   Pulse 68   Temp 98 F (36.7 C)   Ht 5\' 8"  (1.727 m)   Wt 157 lb 3.2 oz (71.3 kg)   SpO2 99%   BMI 23.90  kg/m   General Appearance: NAD.  Awake, conversant and cooperative. Eyes: PERRLA, EOMs intact.  Sclera white.  Conjunctiva without erythema. Sinuses: No frontal/maxillary tenderness.  No nasal discharge. Nares patent.  ENT/Mouth: Ext aud canals clear.  Bilateral TMs w/DOL and without erythema or bulging. Hearing intact.  Posterior pharynx without swelling or exudate.  Tonsils without swelling or erythema.  Neck: Supple.  No masses, nodules or thyromegaly. Respiratory: Effort is regular with non-labored breathing. Breath sounds are equal bilaterally without rales, rhonchi, wheezing or stridor.  Cardio: RRR with no MRGs. Brisk peripheral pulses without edema.  Abdomen: Active BS in all four quadrants.  Soft and non-tender without guarding, rebound tenderness, hernias or masses. Lymphatics: Non tender without lymphadenopathy.  Musculoskeletal: Full ROM, 5/5 strength, normal ambulation.  No clubbing or cyanosis. Skin: Appropriate color for ethnicity. Warm without  rashes, lesions, ecchymosis, ulcers.  Neuro: CN II-XII grossly normal. Normal muscle tone without cerebellar symptoms and intact sensation.   Psych: AO X 3,  appropriate mood and affect, insight and judgment.     Adela Glimpse, NP 9:48 PM [facility]

## 2023-01-21 ENCOUNTER — Encounter (HOSPITAL_COMMUNITY): Payer: Self-pay | Admitting: Internal Medicine

## 2023-01-21 ENCOUNTER — Other Ambulatory Visit: Payer: BC Managed Care – PPO

## 2023-01-21 LAB — CYTOLOGY - NON PAP

## 2023-01-23 ENCOUNTER — Encounter (HOSPITAL_COMMUNITY): Admission: RE | Payer: Self-pay | Source: Home / Self Care

## 2023-01-23 ENCOUNTER — Ambulatory Visit (HOSPITAL_COMMUNITY)
Admission: RE | Admit: 2023-01-23 | Payer: BC Managed Care – PPO | Source: Home / Self Care | Admitting: Gastroenterology

## 2023-01-23 SURGERY — ENDOSCOPIC RETROGRADE CHOLANGIOPANCREATOGRAPHY (ERCP) WITH PROPOFOL
Anesthesia: General

## 2023-01-23 NOTE — Telephone Encounter (Signed)
Patients wife called stated the patient is in a lot of pain and the hospital did not provide him with any medications.

## 2023-01-24 ENCOUNTER — Other Ambulatory Visit: Payer: Self-pay

## 2023-01-24 ENCOUNTER — Other Ambulatory Visit: Payer: BC Managed Care – PPO

## 2023-01-24 DIAGNOSIS — R1013 Epigastric pain: Secondary | ICD-10-CM | POA: Diagnosis not present

## 2023-01-24 DIAGNOSIS — C259 Malignant neoplasm of pancreas, unspecified: Secondary | ICD-10-CM

## 2023-01-24 LAB — COMPREHENSIVE METABOLIC PANEL
ALT: 182 U/L — ABNORMAL HIGH (ref 0–53)
AST: 74 U/L — ABNORMAL HIGH (ref 0–37)
Albumin: 4.2 g/dL (ref 3.5–5.2)
Alkaline Phosphatase: 461 U/L — ABNORMAL HIGH (ref 39–117)
BUN: 16 mg/dL (ref 6–23)
CO2: 28 meq/L (ref 19–32)
Calcium: 9.8 mg/dL (ref 8.4–10.5)
Chloride: 101 meq/L (ref 96–112)
Creatinine, Ser: 1 mg/dL (ref 0.40–1.50)
GFR: 80.98 mL/min (ref >60.00)
Glucose, Bld: 111 mg/dL — ABNORMAL HIGH (ref 70–99)
Potassium: 4.3 meq/L (ref 3.5–5.1)
Sodium: 136 meq/L (ref 135–145)
Total Bilirubin: 2.7 mg/dL — ABNORMAL HIGH (ref 0.2–1.2)
Total Protein: 6.8 g/dL (ref 6.0–8.3)

## 2023-01-24 LAB — CBC WITH DIFFERENTIAL/PLATELET
Basophils Absolute: 0 10*3/uL (ref 0.0–0.1)
Basophils Relative: 0.5 % (ref 0.0–3.0)
Eosinophils Absolute: 0.4 10*3/uL (ref 0.0–0.7)
Eosinophils Relative: 5.3 % — ABNORMAL HIGH (ref 0.0–5.0)
HCT: 44.6 % (ref 39.0–52.0)
Hemoglobin: 14.7 g/dL (ref 13.0–17.0)
Lymphocytes Relative: 21.3 % (ref 12.0–46.0)
Lymphs Abs: 1.6 10*3/uL (ref 0.7–4.0)
MCHC: 33 g/dL (ref 30.0–36.0)
MCV: 89.6 fL (ref 78.0–100.0)
Monocytes Absolute: 0.6 10*3/uL (ref 0.1–1.0)
Monocytes Relative: 8.3 % (ref 3.0–12.0)
Neutro Abs: 4.8 10*3/uL (ref 1.4–7.7)
Neutrophils Relative %: 64.6 % (ref 43.0–77.0)
Platelets: 304 10*3/uL (ref 150.0–400.0)
RBC: 4.99 Mil/uL (ref 4.22–5.81)
RDW: 14.6 % (ref 11.5–15.5)
WBC: 7.4 10*3/uL (ref 4.0–10.5)

## 2023-01-24 MED ORDER — TRAMADOL HCL 50 MG PO TABS
50.0000 mg | ORAL_TABLET | ORAL | 0 refills | Status: DC | PRN
Start: 2023-01-24 — End: 2023-06-19

## 2023-01-24 NOTE — Telephone Encounter (Signed)
Spoke with pt and he is aware of recommendations per Dr. Marina Goodell. Lab orders entered as stat, script called to pharmacy. Pt knows to come for labs.

## 2023-01-24 NOTE — Telephone Encounter (Signed)
Linda, 1.  The pain may be related to his cancer 2.  Check CBC and comprehensive metabolic panel today.  Order stat. 3.  Prescribe tramadol 50 mg; #60; 1 p.o. every 4 hours as needed pain.  No refills 4.  Keep oncology appointment on Monday 5.  If he develops fever or any new or worsening pain in the interim, proceed to the ER Thanks, Dr. Marina Goodell

## 2023-01-24 NOTE — Telephone Encounter (Signed)
Disregard last note. Dr. Marina Goodell did stent inpatient. Dr. Marina Goodell, please review and let me know if you agree with stat CT to evaluate stent placement and labs to evaluate LFTs or any other recommendations you may have. Thank you in advance and I appreciate your time.

## 2023-01-24 NOTE — Telephone Encounter (Signed)
Patient calls with complaints of ongoing epigastric abdominal pain as well as some back pain. When asked, patient tells me that this pain has remained constant and is NOT any worse following ERCP. Patient states that his back pain originally subsided immediately following ERCP but he has noticed this has returned. He notes that he is now having pretty constant nausea and wonders if we could prescribe him something for this. No vomiting to date. Patient also says that his jaundice pretty much subsided following ERCP but over the last several days, he has again started noticing icterus. Patient denies any fever.  Again, patient questions what he can use for nausea and abdominal pain. I have advised that in the short term, he may take tylenol for abdominal pain.   He does have an appointment with oncology on Monday, 01/27/23.  Please advise

## 2023-01-26 NOTE — Patient Instructions (Signed)
Blood Glucose Monitoring, Adult To manage your diabetes, you will need to keep track of your blood sugar (glucose). Check your blood glucose as often as told. Keep a record of your results over time. This can help you: Know when to adjust your diabetes management plan with your health care provider. See how food, exercise, illness, and medicines affect your blood glucose. Know what your blood glucose is at any time. Your provider will set specific goals for your blood glucose levels. In many cases, these goals may be: Before meals (preprandial): 80-130 mg/dL (1.6-1.0 mmol/L). After meals (postprandial): below 180 mg/dL (10 mmol/L). A1C level: less than 7%. Supplies needed: Blood glucose meter. Test strips for your meter. Each meter has its own strips. You must use the strips that came with your meter. A needle to prick your finger (lancet). Do not use a lancet more than once. A device that holds the lancet (lancing device). A journal or logbook to write down your results. How to check your blood glucose Checking your blood glucose  Wash your hands with soap and water for at least 20 seconds. Prick the side of your finger with the lancet. Do not prick the tip of your finger. Do not use the same finger more than once. Gently rub the finger until a small drop of blood appears. Follow the instructions that came with the meter about how to insert the test strip, apply blood to the strip, and use the meter. Write down your result and any notes. Using alternative sites Some meters let you use other areas of your body (alternative sites) to test your blood. The most common places are the forearm, the thigh, and the palm of your hand. Alternative sites may not be as accurate as your fingers. The result you get may also be delayed. Use the finger only, and do not use alternative sites, if: You think you have low blood glucose (hypoglycemia). You sometimes do not know that your blood glucose is  getting low (hypoglycemia unawareness). General tips and recommendations Blood glucose log  Write down the result each time you check your blood glucose. Note anything that may be affecting your blood glucose. This can help you and your provider: Look for patterns over time. Adjust your management plan as needed. Check if your meter has an app or lets you download your records to a computer. Most meters keep a record of glucose readings in the meter. If you have type 1 diabetes: You may need to check your blood glucose 4 or more times a day. Check your blood glucose as often as told by your provider. This may include: Before each meal and snack. Two hours after a meal. Before bedtime. If you have symptoms of hypoglycemia. After treating your hypoglycemia. Before doing things that have a risk of injury, such as driving or using machinery. Before and after exercise. Between 2:00 a.m. and 3:00 a.m., as told. You may need to check your blood glucose more often, such as up to 6-10 times a day, if: You have diabetes that is not well controlled. You are ill. You have a history of severe hypoglycemia. You have hypoglycemia unawareness. If you have type 2 diabetes: You may need to check your blood glucose 2 or more times a day. Check your blood glucose as often as told by your provider. This may include: Before and after exercise. Before doing things that have a risk of injury, such as driving or using machinery. You may need to check  your blood glucose more often if: Your medicine is being adjusted. Your diabetes is not well controlled. You are ill. General tips Make sure you always have your supplies with you. After you use a few boxes of test strips, adjust (calibrate) your blood glucose meter. Follow the instructions that came with your meter. If you have questions or need help, all blood glucose meters have a 24-hour hotline phone number that you can call. Also contact your provider  with any questions or concerns. Where to find more information The American Diabetes Association: diabetes.org The Association of Diabetes Care & Education Specialists: diabeteseducator.org Contact a health care provider if: Your blood glucose is at or above 240 mg/dL (16.1 mmol/L) for 2 days in a row. You have been sick or have had a fever for 2 days or longer and are not getting better. You have any of these problems for more than 6 hours: You cannot eat or drink. You have nausea or vomiting. You have diarrhea. Get help right away if: Your blood glucose is lower than 54 mg/dL (3 mmol/L). You become confused, or you have trouble thinking clearly. You have trouble breathing. You have moderate to high ketone levels in your pee (urine). These symptoms may be an emergency. Get help right away. Call 911. Do not wait to see if the symptoms will go away. Do not drive yourself to the hospital. This information is not intended to replace advice given to you by your health care provider. Make sure you discuss any questions you have with your health care provider. Document Revised: 02/15/2022 Document Reviewed: 02/15/2022 Elsevier Patient Education  2024 [employer].

## 2023-01-27 ENCOUNTER — Ambulatory Visit: Payer: BC Managed Care – PPO | Admitting: Oncology

## 2023-01-27 ENCOUNTER — Other Ambulatory Visit: Payer: Self-pay

## 2023-01-27 ENCOUNTER — Telehealth: Payer: Self-pay

## 2023-01-27 ENCOUNTER — Encounter: Payer: Self-pay | Admitting: *Deleted

## 2023-01-27 ENCOUNTER — Ambulatory Visit: Payer: BC Managed Care – PPO | Admitting: Internal Medicine

## 2023-01-27 ENCOUNTER — Inpatient Hospital Stay: Payer: BC Managed Care – PPO | Attending: Oncology | Admitting: Oncology

## 2023-01-27 VITALS — BP 134/94 | HR 73 | Temp 98.2°F | Resp 18 | Ht 68.0 in | Wt 152.0 lb

## 2023-01-27 DIAGNOSIS — C259 Malignant neoplasm of pancreas, unspecified: Secondary | ICD-10-CM

## 2023-01-27 DIAGNOSIS — M549 Dorsalgia, unspecified: Secondary | ICD-10-CM | POA: Insufficient documentation

## 2023-01-27 DIAGNOSIS — I10 Essential (primary) hypertension: Secondary | ICD-10-CM | POA: Insufficient documentation

## 2023-01-27 DIAGNOSIS — R109 Unspecified abdominal pain: Secondary | ICD-10-CM | POA: Insufficient documentation

## 2023-01-27 DIAGNOSIS — C250 Malignant neoplasm of head of pancreas: Secondary | ICD-10-CM | POA: Insufficient documentation

## 2023-01-27 DIAGNOSIS — Z79899 Other long term (current) drug therapy: Secondary | ICD-10-CM | POA: Insufficient documentation

## 2023-01-27 DIAGNOSIS — R739 Hyperglycemia, unspecified: Secondary | ICD-10-CM | POA: Insufficient documentation

## 2023-01-27 DIAGNOSIS — R748 Abnormal levels of other serum enzymes: Secondary | ICD-10-CM | POA: Insufficient documentation

## 2023-01-27 DIAGNOSIS — Q2381 Bicuspid aortic valve: Secondary | ICD-10-CM | POA: Insufficient documentation

## 2023-01-27 LAB — ANTI-DNA ANTIBODY, DOUBLE-STRANDED: ds DNA Ab: <1 [IU]/mL

## 2023-01-27 LAB — CANCER ANTIGEN 19-9: CA 19-9: 31 U/mL (ref <34)

## 2023-01-27 LAB — MITOCHONDRIAL ANTIBODIES: Mitochondrial M2 Ab, IgG: <=20 U (ref <=20.0)

## 2023-01-27 LAB — ANA: Anti Nuclear Antibody (ANA): NEGATIVE

## 2023-01-27 LAB — HEMOCHROMATOSIS DNA-PCR(C282Y,H63D)

## 2023-01-27 LAB — ALPHA-1-ANTITRYPSIN: A-1 Antitrypsin, Ser: 210 mg/dL — ABNORMAL HIGH (ref 83–199)

## 2023-01-27 LAB — ANTI-SMOOTH MUSCLE ANTIBODY, IGG: Actin (Smooth Muscle) Antibody (IGG): <20 U (ref <20)

## 2023-01-27 LAB — HEPATITIS B CORE ANTIBODY, TOTAL: Hep B Core Total Ab: NONREACTIVE

## 2023-01-27 LAB — IGG: IgG (Immunoglobin G), Serum: 1019 mg/dL (ref 600–1540)

## 2023-01-27 NOTE — Telephone Encounter (Addendum)
EUS has been set up for 10/24 at [facility] with GM at 845 am    Left message on machine to call back

## 2023-01-27 NOTE — Telephone Encounter (Signed)
-----   Message from [facility] sent at 01/27/2023  3:48 PM EDT ----- Regarding: RE: Request an EUS WPO, OK. GM   Amairany Schumpert,  Let's plan for [place] EUS 10/24 if it works with his schedule. GM ----- Message ----- From: Ardell Isaacs, RN Sent: 01/27/2023   2:42 PM EDT To: Loretha Stapler, RN; Lemar Lofty., MD Subject: Request an EUS                                 Dr Truett Perna saw new pt for possible pancreatic cancer and would like to request an EUS for further evaluation.  His differential diagnosis now includes cholangiocarcinoma.    Please let me know if you need any additional information.  Thank you Marylouise Stacks RN [facility] Nurse Navigator [phone]

## 2023-01-27 NOTE — Progress Notes (Signed)
PATIENT NAVIGATOR PROGRESS NOTE  Name: Randy Allen Date: 01/27/2023 MRN: [id number]  DOB: March 06, 1961   Reason for visit:  New patient appt  Comments:  Met with Mr and [name] during visit with Dr Truett Perna  Referral to surgeons  Referral to [facility] for EUS  Case will be presented at GI Conference this week Given contact numbers to call with any issues or questions and return to clinic in 2 weeks     Time spent counseling/coordinating care: 45-60 minutes

## 2023-01-27 NOTE — Progress Notes (Signed)
[name] New Patient Consult   Requesting MD: [name] [address] [place],  [place] [address]   Randy Allen 62 y.o.  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 had abdominal pain in June.  Abdominal ultrasound revealed no acute finding.  The pancreas was poorly visualized.  He developed dark urine and light-colored stools approximately 12/31/2022.  This was followed by the development of jaundice 01/09/2023.  He saw gastroenterology 01/14/2023 and the liver enzymes were elevated.  A CT abdomen/pelvis 01/10/2023 revealed no acute finding.  The pancreas appeared unremarkable.  No focal liver abnormality and no biliary dilation. An MRCP was ordered.  The MRI/MRCP on 01/15/2023 revealed a distended gallbladder and mild intra and extrahepatic biliary duct dilation.  The common bile duct is abruptly truncated within the superior pancreas head.  A hypoenhancing mass within the sacral pancreas head measured 2.6 x 2.4 cm.  There is dilation of the main pancreas duct.  The mass contacts the adjacent central superior mesenteric vein and portal confluence less than 180 degrees with preserved fat planes to the SMA. He was admitted 01/16/2023 with increased abdominal pain.  He was taken to an ERCP by Dr. Marina Goodell on 01/18/2023.  There was a single stenosis of the lower third of the main bile duct measuring almost 2 cm in length.  Brushings were obtained.  A plastic stent was placed in the common bile duct.  The bile duct brushing returned with malignant cells consistent with adenocarcinoma.  Mr. [name] reports feeling better following the ERCP.  The jaundice has resolved.  He has persistent abdomen/back pain.      Past Medical History:  Diagnosis Date   Dental crowns present    Heart murmur    states need for antibiotics prior to dental procedures; no cardiologist   High blood cholesterol    History of hepatitis A    age 30    History of MRSA infection 04/15/2013   right great toe   Hyperlipidemia    Hypertension    states under control with med., has been on med. > 10 yr.   Hypothyroidism    Mass of skin of right shoulder 04/16/2015   Neck mass 04/16/2015   posterior   Seasonal allergies    Vitamin D deficiency     .  Sinus "headaches  Past Surgical History:  Procedure Laterality Date   BILIARY BRUSHING  01/18/2023   Procedure: BILIARY BRUSHING;  Surgeon: Hilarie Fredrickson, MD;  Location: [facility] ENDOSCOPY;  Service: Gastroenterology;;   BILIARY STENT PLACEMENT  01/18/2023   Procedure: BILIARY STENT PLACEMENT;  Surgeon: Hilarie Fredrickson, MD;  Location: [facility] ENDOSCOPY;  Service: Gastroenterology;;   COLONOSCOPY WITH PROPOFOL  10/21/2013   ENDOSCOPIC RETROGRADE CHOLANGIOPANCREATOGRAPHY (ERCP) WITH PROPOFOL N/A 01/18/2023   Procedure: ENDOSCOPIC RETROGRADE CHOLANGIOPANCREATOGRAPHY (ERCP) WITH PROPOFOL;  Surgeon: Hilarie Fredrickson, MD;  Location[facility] ENDOSCOPY;  Service: Gastroenterology;  Laterality: N/A;   EYE SURGERY Right 1966   HYDROCELE EXCISION     MASS EXCISION Right 04/26/2015   Procedure: EXCISION MASS NECK AND RIGHT SHOULDER ;  Surgeon: Luretha Murphy, MD;  Location: [facility];  Service: General;  Laterality: Right;   SPHINCTEROTOMY  01/18/2023   Procedure: SPHINCTEROTOMY;  Surgeon: Hilarie Fredrickson, MD;  Location[facility] END[facility]COPY;  Service: Gastroenterology;;    Medications: Reviewed  Allergies:  Allergies  Allergen Reactions   Propofol Shortness Of Breath  CHEST TIGHTNESS _Patient stated he was told this medication was "pushed to fast" and that is what caused his symptoms   Zoloft [Sertraline Hcl] Other (See Comments)    HYPERACTIVE   Quinolones Other (See Comments)    Aortic aneurysm--Did not feel well    Family history: His father had lung cancer and prostate cancer.  His mother had lung cancer.  A maternal uncle had colon cancer.  Social History:   He lives with his wife in [place].  He  works as a Scientist, forensic.  He does not use cigarettes or alcohol.  No transfusion history.  He had hepatitis A as a child.  No risk factor for hepatitis B/C or HIV.  ROS:   Positives include: Abdomen/back pain, mild constipation, dark urine and light-colored stool-resolved, jaundice-resolved, intermittent sinus headaches  A complete ROS was otherwise negative.  Physical Exam: Blood pressure (!) 134/94, pulse 73, temperature 98.2 F (36.8 C), temperature source Temporal, resp. rate 18, height 5\' 8"  (1.727 m), weight 152 lb (68.9 kg), SpO2 100%.  HEENT: Oropharynx without visible mass, neck without mass, mild scleral icterus Lungs: Clear bilaterally Cardiac: Regular rate and rhythm Abdomen: No hepatosplenomegaly, no mass, mild tenderness in the mid upper abdomen  Vascular: No leg edema Lymph nodes: No cervical, supraclavicular, axillary, or inguinal nodes Neurologic: Alert and oriented, the motor exam appears intact in the upper and lower extremities bilaterally Skin: No rash Musculoskeletal: No spine tenderness   LAB:  CBC  Lab Results  Component Value Date   WBC 7.4 01/24/2023   HGB 14.7 01/24/2023   HCT 44.6 01/24/2023   MCV 89.6 01/24/2023   PLT 304.0 01/24/2023   NEUTROABS 4.8 01/24/2023        CMP  Lab Results  Component Value Date   NA 136 01/24/2023   K 4.3 01/24/2023   CL 101 01/24/2023   CO2 28 01/24/2023   GLUCOSE 111 (H) 01/24/2023   BUN 16 01/24/2023   CREATININE 1.00 01/24/2023   CALCIUM 9.8 01/24/2023   PROT 6.8 01/24/2023   ALBUMIN 4.2 01/24/2023   AST 74 (H) 01/24/2023   ALT 182 (H) 01/24/2023   ALKPHOS 461 (H) 01/24/2023   BILITOT 2.7 (H) 01/24/2023   GFRNONAA >60 01/19/2023   GFRAA 97 07/04/2020     Imaging:  As per HPI    Assessment/Plan:   Pancreas cancer Elevated liver enzymes/bilirubin September 2024 CT abdomen/pelvis 01/10/2023-no focal liver abnormality, no biliary dilation, pancreas unremarkable MRI/MRCP 01/15/2023-mild intra  and extrahepatic biliary duct dilation, common duct is abruptly truncated in the superior pancreas head, hypoenhancing mass in the central pancreas head measuring 2.6 x 2.4 cm, remaining pancreas is atrophic with dilation of the main pancreatic duct, mass contacts the adjacent central SMV and portal confluence with less than 180 degrees of contact, preserved fat plane to the SMA with no enlarged abdominal lymph nodes, no ascites ERCP 01/18/2023-single stenosis in the lower third of the main bile duct, brushings obtained, plastic stent placed-adenocarcinoma Abdomen/back pain secondary to #1 Bicuspid aortic valve Hypertension   Disposition:   Mr. Burdge has been diagnosed with pancreas cancer after presenting with obstructive jaundice.  He appears to have borderline resectable disease based on the staging evaluation to date.  He underwent placement of a bile duct stent for relief of the biliary obstruction.  I discussed the diagnosis and treatment options with Mr. Ghan and his wife.  His case will be presented at the GI tumor conference on 01/29/2023.  We will discuss  the indication for a staging EUS and neoadjuvant therapy.  Mr. Muenchow will be referred for a surgical consult.  He will return for an office visit in approximately 2 weeks.  Thornton Papas, MD  01/27/2023, 2:19 PM

## 2023-01-28 NOTE — Telephone Encounter (Signed)
EUS scheduled, pt instructed and medications reviewed.  Patient instructions mailed to home and sent to My Chart .  Patient to call with any questions or concerns.

## 2023-01-29 ENCOUNTER — Other Ambulatory Visit: Payer: Self-pay | Admitting: *Deleted

## 2023-01-29 ENCOUNTER — Other Ambulatory Visit: Payer: Self-pay

## 2023-01-29 DIAGNOSIS — C250 Malignant neoplasm of head of pancreas: Secondary | ICD-10-CM

## 2023-01-29 NOTE — Progress Notes (Signed)
The proposed treatment discussed in conference is for discussion purpose only and is not a binding recommendation.  The patients have not been physically examined, or presented with their treatment options.  Therefore, final treatment plans cannot be decided.  

## 2023-01-30 ENCOUNTER — Ambulatory Visit: Payer: BC Managed Care – PPO | Admitting: Hematology

## 2023-01-30 ENCOUNTER — Encounter (HOSPITAL_COMMUNITY): Payer: Self-pay | Admitting: Gastroenterology

## 2023-01-30 ENCOUNTER — Encounter: Payer: Self-pay | Admitting: Thoracic Surgery (Cardiothoracic Vascular Surgery)

## 2023-02-04 ENCOUNTER — Other Ambulatory Visit: Payer: Self-pay | Admitting: General Surgery

## 2023-02-04 ENCOUNTER — Telehealth: Payer: Self-pay | Admitting: *Deleted

## 2023-02-04 DIAGNOSIS — E43 Unspecified severe protein-calorie malnutrition: Secondary | ICD-10-CM | POA: Diagnosis not present

## 2023-02-04 DIAGNOSIS — K831 Obstruction of bile duct: Secondary | ICD-10-CM | POA: Diagnosis not present

## 2023-02-04 DIAGNOSIS — C250 Malignant neoplasm of head of pancreas: Secondary | ICD-10-CM | POA: Diagnosis not present

## 2023-02-04 NOTE — Telephone Encounter (Signed)
Pre-operative Risk Assessment    Patient Name: Randy Allen  DOB: 1961/03/01 MRN: [id number]  DATE OF LAST VISIT: 06/24/22 DR. ROSS DATE OF NEXT VISIT: NONE    Request for Surgical Clearance    Procedure:   PORT PLACEMENT  TO BE DONE IN THE NEAR FUTURE AND WILL HAVE WHIPPLE SURGERY IN 3-5 MONTHS  Date of Surgery:  Clearance TBD                                 Surgeon: DR. [name] Surgeon's Group or Practice Name:  [facility] Phone number:  [phone] Fax number:  [phone] ATTN: Santiago Glad, CMA   Type of Clearance Requested:   - Medical ; ASA    Type of Anesthesia:  General    Additional requests/questions:    Elpidio Anis   02/04/2023, 4:19 PM

## 2023-02-04 NOTE — Telephone Encounter (Signed)
Name: Randy Allen  DOB: 1960-07-30  MRN: [id number]  Primary Cardiologist: None   Preoperative team, please contact this patient and set up a phone call appointment for further preoperative risk assessment. Please obtain consent and complete medication review. Thank you for your help.  Patient will need to have a secondary preoperative cardiac clearance request submitted for Whipple surgery in 3 to 5 months.  We are not able to combined clearance request.  I confirm that guidance regarding antiplatelet and oral anticoagulation therapy has been completed and, if necessary, noted below.  Patient's aspirin is not prescribed by cardiology.  Recommendations for holding aspirin will need to come from prescribing provider.     I also confirmed the patient resides in the state of [place]. As per [facility] Medical Board telemedicine laws, the patient must reside in the state in which the provider is licensed.   Ronney Asters, NP 02/04/2023, 4:34 PM [facility]

## 2023-02-05 ENCOUNTER — Other Ambulatory Visit (HOSPITAL_COMMUNITY): Payer: Self-pay

## 2023-02-05 ENCOUNTER — Telehealth: Payer: Self-pay | Admitting: *Deleted

## 2023-02-05 ENCOUNTER — Ambulatory Visit: Payer: BC Managed Care – PPO | Attending: General Practice

## 2023-02-05 ENCOUNTER — Other Ambulatory Visit: Payer: Self-pay | Admitting: *Deleted

## 2023-02-05 DIAGNOSIS — C250 Malignant neoplasm of head of pancreas: Secondary | ICD-10-CM

## 2023-02-05 DIAGNOSIS — I10 Essential (primary) hypertension: Secondary | ICD-10-CM

## 2023-02-05 DIAGNOSIS — Z01810 Encounter for preprocedural cardiovascular examination: Secondary | ICD-10-CM

## 2023-02-05 MED ORDER — ENALAPRIL MALEATE 10 MG PO TABS
ORAL_TABLET | ORAL | 2 refills | Status: DC
  Filled 2023-02-05: qty 90, 60d supply, fill #0

## 2023-02-05 NOTE — Telephone Encounter (Signed)
I returned pt's call and left message to call back.

## 2023-02-05 NOTE — Telephone Encounter (Signed)
Pt has been scheduled tele pre op due to needing port placement and Dr. Donell Beers wanting to get done ASAP. Med rec and consent are done.    Pt tells me that his BP has been running a bit higher than he would like around the mid 140/90's

## 2023-02-05 NOTE — Telephone Encounter (Signed)
Pt has been scheduled tele pre op due to needing port placement and Dr. Donell Beers wanting to get done ASAP. Med rec and consent are done.   Pt tells me that his BP has been running a bit higher than he would like around the mid 140/90's     Patient Consent for Virtual Visit        Randy Allen has provided verbal consent on 02/05/2023 for a virtual visit (video or telephone).   CONSENT FOR VIRTUAL VISIT FOR:  Randy Allen  By participating in this virtual visit I agree to the following:  I hereby voluntarily request, consent and authorize [facility] and its employed or contracted physicians, physician assistants, nurse practitioners or other licensed health care professionals (the Practitioner), to provide me with telemedicine health care services (the "Services") as deemed necessary by the treating Practitioner. I acknowledge and consent to receive the Services by the Practitioner via telemedicine. I understand that the telemedicine visit will involve communicating with the Practitioner through live audiovisual communication technology and the disclosure of certain medical information by electronic transmission. I acknowledge that I have been given the opportunity to request an in-person assessment or other available alternative prior to the telemedicine visit and am voluntarily participating in the telemedicine visit.  I understand that I have the right to withhold or withdraw my consent to the use of telemedicine in the course of my care at any time, without affecting my right to future care or treatment, and that the Practitioner or I may terminate the telemedicine visit at any time. I understand that I have the right to inspect all information obtained and/or recorded in the course of the telemedicine visit and may receive copies of available information for a reasonable fee.  I understand that some of the potential risks of receiving the Services via telemedicine include:  Delay or  interruption in medical evaluation due to technological equipment failure or disruption; Information transmitted may not be sufficient (e.g. poor resolution of images) to allow for appropriate medical decision making by the Practitioner; and/or  In rare instances, security protocols could fail, causing a breach of personal health information.  Furthermore, I acknowledge that it is my responsibility to provide information about my medical history, conditions and care that is complete and accurate to the Shutt of my ability. I acknowledge that Practitioner's advice, recommendations, and/or decision may be based on factors not within their control, such as incomplete or inaccurate data provided by me or distortions of diagnostic images or specimens that may result from electronic transmissions. I understand that the practice of medicine is not an exact science and that Practitioner makes no warranties or guarantees regarding treatment outcomes. I acknowledge that a copy of this consent can be made available to me via my patient portal [facility] MyChart), or I can request a printed copy by calling the office of [facility].    I understand that my insurance will be billed for this visit.   I have read or had this consent read to me. I understand the contents of this consent, which adequately explains the benefits and risks of the Services being provided via telemedicine.  I have been provided ample opportunity to ask questions regarding this consent and the Services and have had my questions answered to my satisfaction. I give my informed consent for the services to be provided through the use of telemedicine in my medical care

## 2023-02-05 NOTE — Progress Notes (Signed)
Virtual Visit via Telephone Note   Because of Randy Allen's co-morbid illnesses, he is at least at moderate risk for complications without adequate follow up.  This format is felt to be most appropriate for this patient at this time.  The patient did not have access to video technology/had technical difficulties with video requiring transitioning to audio format only (telephone).  All issues noted in this document were discussed and addressed.  No physical exam could be performed with this format.  Please refer to the patient's chart for his consent to telehealth for [name].  Evaluation Performed:  Preoperative cardiovascular risk assessment _____________   Date:  02/05/2023   Patient ID:  Randy Allen, DOB 1960/05/03, Randy Allen Patient Location:  Home Provider location:   Office  Primary Care Provider:  Lucky Cowboy, MD Primary Cardiologist:  None  Chief Complaint / Patient Profile   62 y.o. y/o male with a h/o cardiac murmur, hyperlipidemia, hypertension, ascending aortic aneurysm who is pending port placement and presents today for telephonic preoperative cardiovascular risk assessment.  History of Present Illness    Randy Allen is a 62 y.o. male who presents via audio/video conferencing for a telehealth visit today.  Pt was last seen in cardiology clinic on 06/24/2022 by Dr. Tenny Craw.  At that time Randy Allen was doing well .  The patient is now pending procedure as outlined above. Since his last visit, he continues to be stable from a cardiac standpoint.  Today he denies chest pain, shortness of breath, lower extremity edema, fatigue, palpitations, melena, hematuria, hemoptysis, diaphoresis, weakness, presyncope, syncope, orthopnea, and PND.   Past Medical History    Past Medical History:  Diagnosis Date   Dental crowns present    Heart murmur    states need for antibiotics prior to dental procedures; no cardiologist   High blood cholesterol     History of hepatitis A    age 61   History of MRSA infection 04/15/2013   right great toe   Hyperlipidemia    Hypertension    states under control with med., has been on med. > 10 yr.   Hypothyroidism    Mass of skin of right shoulder 04/16/2015   Neck mass 04/16/2015   posterior   Seasonal allergies    Vitamin D deficiency    Past Surgical History:  Procedure Laterality Date   BILIARY BRUSHING  01/18/2023   Procedure: BILIARY BRUSHING;  Surgeon: Hilarie Fredrickson, MD;  Location: [facility] ENDOSCOPY;  Service: Gastroenterology;;   BILIARY STENT PLACEMENT  01/18/2023   Procedure: BILIARY STENT PLACEMENT;  Surgeon: Hilarie Fredrickson, MD;  Location: [facility] ENDOSCOPY;  Service: Gastroenterology;;   COLONOSCOPY WITH PROPOFOL  10/21/2013   ENDOSCOPIC RETROGRADE CHOLANGIOPANCREATOGRAPHY (ERCP) WITH PROPOFOL N/A 01/18/2023   Procedure: ENDOSCOPIC RETROGRADE CHOLANGIOPANCREATOGRAPHY (ERCP) WITH PROPOFOL;  Surgeon: Hilarie Fredrickson, MD;  Location: [facility] ENDOSCOPY;  Service: Gastroenterology;  Laterality: N/A;   EYE SURGERY Right 1966   HYDROCELE EXCISION     MASS EXCISION Right 04/26/2015   Procedure: EXCISION MASS NECK AND RIGHT SHOULDER ;  Surgeon: Luretha Murphy, MD;  Location: [facility];  Service: General;  Laterality: Right;   SPHINCTEROTOMY  01/18/2023   Procedure: SPHINCTEROTOMY;  Surgeon: Hilarie Fredrickson, MD;  Location: [facility] ENDOSCOPY;  Service: Gastroenterology;;    Allergies  Allergies  Allergen Reactions   Zoloft [Sertraline Hcl] Other (See Comments)    HYPERACTIVE   Quinolones Other (See Comments)  Aortic aneurysm--Did not feel well    Home Medications    Prior to Admission medications   Medication Sig Start Date End Date Taking? Authorizing Provider  aspirin EC 81 MG tablet Take 81 mg by mouth at bedtime.    [provider]  atorvastatin (LIPITOR) 40 MG tablet Take  1 tablet  Daily  for Cholesterol Patient not taking: Reported on 01/27/2023 06/21/21   Lucky Cowboy, MD   Blood Glucose Monitoring Suppl DEVI 1 each by Does not apply route in the morning, at noon, and at bedtime. May substitute to any manufacturer covered by patient's insurance. 01/20/23   Adela Glimpse, NP  enalapril (VASOTEC) 10 MG tablet take 1 tablet by mouth daily for blood pressure Patient taking differently: Take 10 mg by mouth at bedtime. take 1 tablet by mouth daily for blood pressure 01/06/23   Adela Glimpse, NP  famotidine (PEPCID) 40 MG tablet take 1 tablet by mouth daily for heart burn and acid indigestion Patient not taking: Reported on 01/27/2023 10/07/22   Raynelle Dick, NP  Glucose Blood (BLOOD GLUCOSE TEST STRIPS) STRP 1 each by In Vitro route in the morning, at noon, and at bedtime. May substitute to any manufacturer covered by patient's insurance. 01/20/23 02/22/23  Adela Glimpse, NP  Lancet Device MISC 1 each by Does not apply route in the morning, at noon, and at bedtime. May substitute to any manufacturer covered by patient's insurance. 01/20/23 02/19/23  Adela Glimpse, NP  Lancets Misc. MISC 1 each by Does not apply route in the morning, at noon, and at bedtime. May substitute to any manufacturer covered by patient's insurance. 01/20/23 02/19/23  Adela Glimpse, NP  levothyroxine (SYNTHROID) 75 MCG tablet Take  1 tablet  Daily  on an empty stomach with only water for 30 minutes & no Antacid meds, Calcium or Magnesium for 4 hours & avoid Biotin Patient taking differently: 1.5 tablets by mouth Monday Wednesday Friday. 1 tablet by mouth all other days 03/06/22   Lucky Cowboy, MD  Multiple Vitamin (MULTIVITAMIN) tablet Take 1 tablet by mouth daily. Centrum Silver Patient not taking: Reported on 01/27/2023    [provider]  traMADol (ULTRAM) 50 MG tablet Take 1 tablet (50 mg total) by mouth every 4 (four) hours as needed. Patient not taking: Reported on 02/05/2023 01/24/23   Hilarie Fredrickson, MD    Physical Exam    Vital Signs:  Randy Allen does not have vital  signs available for review today.  Given telephonic nature of communication, physical exam is limited. AAOx3. NAD. Normal affect.  Speech and respirations are unlabored.  Accessory Clinical Findings    None  Assessment & Plan    1.  Preoperative Cardiovascular Risk Assessment: PORT PLACEMENT,  Randy Allen, Randy Allen, Fax number:  [phone]       Primary Cardiologist: Dr. Tenny Craw  Chart reviewed as part of pre-operative protocol coverage. Given past medical history and time since last visit, based on ACC/AHA guidelines, Randy Allen would be at acceptable risk for the planned procedure without further cardiovascular testing.   His RCRI is low risk, 0.9% risk of major cardiac event.  He is able to complete greater than 4 METS of physical activity.  His aspirin is not prescribed by cardiology.  Recommendations for holding aspirin will need to come from prescribing provider.  Patient was advised that if he develops new symptoms prior to surgery to contact our office to arrange a follow-up appointment.  He verbalized understanding.  I will route this recommendation to the requesting party via Epic fax function and remove from pre-op pool.       Time:   Today, I have spent 12 minutes with the patient with telehealth technology discussing medical history, symptoms, and management plan.  Prior to patient's phone evaluation I spent greater than 10 minutes reviewing their past medical history and cardiac medications.    Ronney Asters, NP  02/05/2023, 11:48 AM

## 2023-02-05 NOTE — Telephone Encounter (Signed)
Patient returned Pre-op call. 

## 2023-02-05 NOTE — Anesthesia Preprocedure Evaluation (Signed)
Anesthesia Evaluation  Patient identified by MRN, date of birth, ID band Patient awake    Reviewed: Allergy & Precautions, NPO status , Patient's Chart, lab work & pertinent test results  History of Anesthesia Complications Negative for: history of anesthetic complications  Airway Mallampati: II  TM Distance: >3 FB Neck ROM: Full    Dental  (+) Dental Advisory Given, Teeth Intact   Pulmonary neg pulmonary ROS   Pulmonary exam normal        Cardiovascular hypertension, Pt. on medications Normal cardiovascular exam+ Valvular Problems/Murmurs AS    '24 TTE - EF 60 to 65%. There is moderate asymmetric left ventricular hypertrophy of the basal-septal segment. Trivial mitral valve regurgitation. The aortic valve is bicuspid. Fusion of right and noncoronary cusps. Aortic valve regurgitation is mild. Moderate aortic valve stenosis. Vmax 3.1 m/s, MG 22 mmHg, AVA 1,2 cm^2, DI 0.32 . Aortic dilatation noted. Aneurysm of the ascending aorta, measuring 52 mm. There is dilatation of the aortic root, measuring 43 mm.     Neuro/Psych negative neurological ROS  negative psych ROS   GI/Hepatic negative GI ROS,,,(+) Hepatitis -, A  Endo/Other  Hypothyroidism    Renal/GU CRFRenal disease     Musculoskeletal negative musculoskeletal ROS (+)    Abdominal   Peds  Hematology negative hematology ROS (+)   Anesthesia Other Findings   Reproductive/Obstetrics                             Anesthesia Physical Anesthesia Plan  ASA: 3  Anesthesia Plan: MAC   Post-op Pain Management: Minimal or no pain anticipated   Induction:   PONV Risk Score and Plan: 1 and Propofol infusion and Treatment may vary due to age or medical condition  Airway Management Planned: Nasal Cannula and Natural Airway  Additional Equipment: None  Intra-op Plan:   Post-operative Plan:   Informed Consent: I have reviewed the patients  History and Physical, chart, labs and discussed the procedure including the risks, benefits and alternatives for the proposed anesthesia with the patient or authorized representative who has indicated his/her understanding and acceptance.       Plan Discussed with: CRNA and Anesthesiologist  Anesthesia Plan Comments:         Anesthesia Quick Evaluation

## 2023-02-05 NOTE — Telephone Encounter (Signed)
PRIMARY CARD IS DR. PAULA ROSS. Left message to call back to schedule a tele pr op appt.

## 2023-02-06 ENCOUNTER — Ambulatory Visit (HOSPITAL_COMMUNITY): Payer: Self-pay | Admitting: Anesthesiology

## 2023-02-06 ENCOUNTER — Other Ambulatory Visit: Payer: Self-pay

## 2023-02-06 ENCOUNTER — Ambulatory Visit (HOSPITAL_COMMUNITY): Payer: BC Managed Care – PPO | Admitting: Anesthesiology

## 2023-02-06 ENCOUNTER — Encounter (HOSPITAL_COMMUNITY): Payer: Self-pay | Admitting: Gastroenterology

## 2023-02-06 ENCOUNTER — Ambulatory Visit (HOSPITAL_COMMUNITY)
Admission: RE | Admit: 2023-02-06 | Discharge: 2023-02-06 | Disposition: A | Discharge status: Home / Self Care | Payer: BC Managed Care – PPO | Attending: Gastroenterology | Admitting: Gastroenterology

## 2023-02-06 ENCOUNTER — Encounter (HOSPITAL_COMMUNITY)
Admission: RE | Disposition: A | Discharge status: Home / Self Care | Payer: Self-pay | Source: Home / Self Care | Attending: Gastroenterology

## 2023-02-06 DIAGNOSIS — K449 Diaphragmatic hernia without obstruction or gangrene: Secondary | ICD-10-CM | POA: Insufficient documentation

## 2023-02-06 DIAGNOSIS — I10 Essential (primary) hypertension: Secondary | ICD-10-CM | POA: Diagnosis not present

## 2023-02-06 DIAGNOSIS — K2289 Other specified disease of esophagus: Secondary | ICD-10-CM | POA: Diagnosis not present

## 2023-02-06 DIAGNOSIS — K259 Gastric ulcer, unspecified as acute or chronic, without hemorrhage or perforation: Secondary | ICD-10-CM

## 2023-02-06 DIAGNOSIS — I350 Nonrheumatic aortic (valve) stenosis: Secondary | ICD-10-CM | POA: Insufficient documentation

## 2023-02-06 DIAGNOSIS — C259 Malignant neoplasm of pancreas, unspecified: Secondary | ICD-10-CM

## 2023-02-06 HISTORY — PX: BIOPSY: SHX5522

## 2023-02-06 HISTORY — PX: EUS: SHX5427

## 2023-02-06 HISTORY — DX: Gastric ulcer, unspecified as acute or chronic, without hemorrhage or perforation: K25.9

## 2023-02-06 HISTORY — DX: Diaphragmatic hernia without obstruction or gangrene: K44.9

## 2023-02-06 HISTORY — PX: ESOPHAGOGASTRODUODENOSCOPY: SHX5428

## 2023-02-06 LAB — GLUCOSE, CAPILLARY: Glucose-Capillary: 95 mg/dL (ref 70–99)

## 2023-02-06 SURGERY — UPPER ENDOSCOPIC ULTRASOUND (EUS) RADIAL
Anesthesia: Monitor Anesthesia Care

## 2023-02-06 MED ORDER — PROPOFOL 1000 MG/100ML IV EMUL
INTRAVENOUS | Status: AC
  Filled 2023-02-06: qty 100

## 2023-02-06 MED ORDER — SODIUM CHLORIDE 0.9 % IV SOLN: INTRAVENOUS | Status: DC

## 2023-02-06 MED ORDER — PROPOFOL 10 MG/ML IV BOLUS
INTRAVENOUS | Status: DC | PRN
  Administered 2023-02-06 (×2): 10 mg via INTRAVENOUS
  Administered 2023-02-06 (×2): 20 mg via INTRAVENOUS

## 2023-02-06 MED ORDER — OMEPRAZOLE 40 MG PO CPDR: 40.0000 mg | DELAYED_RELEASE_CAPSULE | Freq: Two times a day (BID) | ORAL | 6 refills | Status: DC

## 2023-02-06 MED ORDER — LIDOCAINE 2% (20 MG/ML) 5 ML SYRINGE
INTRAMUSCULAR | Status: DC | PRN
Start: 2023-02-06 — End: 2023-02-06
  Administered 2023-02-06: 100 mg via INTRAVENOUS

## 2023-02-06 MED ORDER — PROPOFOL 500 MG/50ML IV EMUL
INTRAVENOUS | Status: DC | PRN
  Administered 2023-02-06: 130 ug/kg/min via INTRAVENOUS

## 2023-02-06 NOTE — Plan of Care (Signed)
[facility] Tonsillectomy/Adenoidectomy, Postoperative PEDS care plan entered in error.

## 2023-02-06 NOTE — Discharge Instructions (Signed)

## 2023-02-06 NOTE — Transfer of Care (Signed)
Immediate Anesthesia Transfer of Care Note  Patient: Randy Allen  Procedure(s) Performed: UPPER ENDOSCOPIC ULTRASOUND (EUS) RADIAL ESOPHAGOGASTRODUODENOSCOPY (EGD) BIOPSY  Patient Location: PACU  Anesthesia Type:MAC  Level of Consciousness: sedated  Airway & Oxygen Therapy: Patient Spontanous Breathing and Patient connected to face mask oxygen  Post-op Assessment: Report given to RN and Post -op Vital signs reviewed and stable  Post vital signs: Reviewed and stable  Last Vitals:  Vitals Value Taken Time  BP    Temp    Pulse    Resp    SpO2      Last Pain:  Vitals:   02/06/23 0739  TempSrc: Tympanic  PainSc: 1          Complications: No notable events documented.

## 2023-02-06 NOTE — Op Note (Addendum)
[facility] Patient Name: Randy Allen Procedure Date: 02/06/2023 MRN: [id number] Attending MD: Corliss Parish , MD, [id number] Date of Birth: 1960/11/19 CSN: [id number] Age: 62 Admit Type: Outpatient Procedure:                Upper EUS Indications:              Pre-treatment staging of pancreatic adenocarcinoma Providers:                Corliss Parish, MD, Margaree Mackintosh, RN,                            Harrington Challenger, Technician Referring MD:             [name] MD, MD, Leighton Roach. Otho Darner, MD, Wilhemina Bonito. Marina Goodell, MD, Lucky Cowboy Medicines:                Monitored Anesthesia Care Complications:            No immediate complications. Estimated Blood Loss:     Estimated blood loss was minimal. Procedure:                Pre-Anesthesia Assessment:                           - Prior to the procedure, a History and Physical                            was performed, and patient medications and                            allergies were reviewed. The patient's tolerance of                            previous anesthesia was also reviewed. The risks                            and benefits of the procedure and the sedation                            options and risks were discussed with the patient.                            All questions were answered, and informed consent                            was obtained. Prior Anticoagulants: The patient has                            taken no anticoagulant or antiplatelet agents. ASA                            Grade Assessment: II - A patient with mild systemic  disease. After reviewing the risks and benefits,                            the patient was deemed in satisfactory condition to                            undergo the procedure.                           After obtaining informed consent, the endoscope was                            passed under  direct vision. Throughout the                            procedure, the patient's blood pressure, pulse, and                            oxygen saturations were monitored continuously. The                            [id number]-[id number] ([id number]) Olympus endoscope was introduced                            through the mouth, and advanced to the second part                            of duodenum. The [id number] ([id number]) Olympus linear                            ultrasound scope was introduced through the mouth,                            and advanced to the duodenum for ultrasound                            examination from the stomach and duodenum. The                            upper EUS was accomplished without difficulty. The                            patient tolerated the procedure. Scope In: Scope Out: Findings:      ENDOSCOPIC FINDING: :      No gross lesions were noted in the entire esophagus.      The Z-line was irregular and was found 39 cm from the incisors.      A 3 cm hiatal hernia was present.      Segmental moderate inflammation characterized by erosions, friability       and shallow ulcers was found in the gastric antrum.      No other gross lesions were noted in the entire examined stomach.       Biopsies were taken with a cold forceps for histology and Helicobacter       pylori testing.  No gross lesions were noted in the duodenal bulb, in the first portion       of the duodenum and in the second portion of the duodenum.      A previously placed plastic biliary stent was seen at the major papilla       with sphincterotomy site noted as well.      ENDOSONOGRAPHIC FINDING: :      An irregular mass was identified in the pancreatic head. The mass was       hypoechoic. The mass measured 30 mm by 30 mm in maximal cross-sectional       diameter. The outer margins were irregular. There was sonographic       evidence suggesting abutment of the portal vein and superior mesenteric        vein but with intact fat planes noted. An intact interface was seen       between the mass and the superior mesenteric artery and celiac trunk       suggesting a lack of invasion. The remainder of the pancreas was       examined. The endosonographic appearance of parenchyma suggested atrophy       with some lobularity towards the tail and the upstream pancreatic duct       was dilated (NOP - 6.4 mm, BOP - 4.8 mm, TOP - 3.1 mm) and no ductal       calcifications.      One stent was visualized endosonographically in the common bile duct       with CBD stricture noted due to the mass.      There was dilation in the common hepatic duct (8.5 mm).      Endosonographic imaging of the ampulla showed no intramural       (subepithelial) lesion.      Endosonographic imaging in the visualized portion of the liver showed no       mass.      No malignant-appearing lymph nodes were visualized in the celiac region       (level 20), peripancreatic region and porta hepatis region.      The celiac region was visualized. Impression:               EGD Impression:                           - No gross lesions in the entire esophagus. Z-line                            irregular, 39 cm from the incisors.                           - 3 cm hiatal hernia.                           - Antral gastritis and ulcers noted. No other gross                            lesions in the entire stomach. Biopsied.                           - No gross lesions in the duodenal bulb, in the  first portion of the duodenum and in the second                            portion of the duodenum.                           - Plastic biliary stent at the major papillary                            region.                           EUS Impression:                           - A mass was identified in the pancreatic head. A                            tissue diagnosis was obtained prior to this exam.                             This is consistent with adenocarcinoma. This was                            staged T2 N0 Mx by endosonographic criteria. There                            is abutment of the SMV and the PV but intact fat                            planes are present. No Celiac/SMA involvement was                            noted.                           - One stent was visualized endosonographically in                            the common bile duct.                           - There was dilation in the common hepatic duct.                           - No malignant-appearing lymph nodes were                            visualized in the celiac region (level 20),                            peripancreatic region and porta hepatis region. Moderate Sedation:      Not Applicable - Patient had care per Anesthesia. Recommendation:           - The patient will be  observed post-procedure,                            until all discharge criteria are met.                           - Discharge patient to home.                           - Patient has a contact number available for                            emergencies. The signs and symptoms of potential                            delayed complications were discussed with the                            patient. Return to normal activities tomorrow.                            Written discharge instructions were provided to the                            patient.                           - Resume previous diet.                           - Observe patient's clinical course.                           - Start PPI 40 mg twice daily for 2 months and then                            decrease to once daily to heal upper GI tract.                           - Continue current medications otherwise.                           - Follow up with Dr. Truett Perna and Dr. Donell Beers for                            further treatment evaluation/planning.                           - The  findings and recommendations were discussed                            with the patient.                           - The findings and recommendations were discussed  with the patient's family. Procedure Code(s):        --- Professional ---                           [phone], Esophagogastroduodenoscopy, flexible,                            transoral; with endoscopic ultrasound examination                            limited to the esophagus, stomach or duodenum, and                            adjacent structures                           43239, Esophagogastroduodenoscopy, flexible,                            transoral; with biopsy, single or multiple Diagnosis Code(s):        --- Professional ---                           K22.89, Other specified disease of esophagus                           K44.9, Diaphragmatic hernia without obstruction or                            gangrene                           K29.70, Gastritis, unspecified, without bleeding                           K86.89, Other specified diseases of pancreas                           I89.9, Noninfective disorder of lymphatic vessels                            and lymph nodes, unspecified                           C25.9, Malignant neoplasm of pancreas, unspecified                           K83.8, Other specified diseases of biliary tract CPT copyright 2022 [employer]. All rights reserved. The codes documented in this report are preliminary and upon coder review may  be revised to meet current compliance requirements. Corliss Parish, MD 02/06/2023 9:21:31 AM Number of Addenda: 0

## 2023-02-06 NOTE — H&P (Signed)
GASTROENTEROLOGY PROCEDURE H&P NOTE   Primary Care Physician: Lucky Cowboy, MD  HPI: Randy Allen is a 62 y.o. male who presents for EGD/EUS to evaluate biliary obstruction.  Past Medical History:  Diagnosis Date   Dental crowns present    Heart murmur    states need for antibiotics prior to dental procedures; no cardiologist   High blood cholesterol    History of hepatitis A    age 62   History of MRSA infection 04/15/2013   right great toe   Hyperlipidemia    Hypertension    states under control with med., has been on med. > 10 yr.   Hypothyroidism    Mass of skin of right shoulder 04/16/2015   Neck mass 04/16/2015   posterior   Seasonal allergies    Vitamin D deficiency    Past Surgical History:  Procedure Laterality Date   BILIARY BRUSHING  01/18/2023   Procedure: BILIARY BRUSHING;  Surgeon: Hilarie Fredrickson, MD;  Location: [facility] ENDOSCOPY;  Service: Gastroenterology;;   BILIARY STENT PLACEMENT  01/18/2023   Procedure: BILIARY STENT PLACEMENT;  Surgeon: Hilarie Fredrickson, MD;  Location: [facility] ENDOSCOPY;  Service: Gastroenterology;;   COLONOSCOPY WITH PROPOFOL  10/21/2013   ENDOSCOPIC RETROGRADE CHOLANGIOPANCREATOGRAPHY (ERCP) WITH PROPOFOL N/A 01/18/2023   Procedure: ENDOSCOPIC RETROGRADE CHOLANGIOPANCREATOGRAPHY (ERCP) WITH PROPOFOL;  Surgeon: Hilarie Fredrickson, MD;  Location: [facility] ENDOSCOPY;  Service: Gastroenterology;  Laterality: N/A;   EYE SURGERY Right 1966   HYDROCELE EXCISION     MASS EXCISION Right 04/26/2015   Procedure: EXCISION MASS NECK AND RIGHT SHOULDER ;  Surgeon: Luretha Murphy, MD;  Location: [facility];  Service: General;  Laterality: Right;   SPHINCTEROTOMY  01/18/2023   Procedure: SPHINCTEROTOMY;  Surgeon: Hilarie Fredrickson, MD;  Location: [facility] ENDOSCOPY;  Service: Gastroenterology;;   Current Facility-Administered Medications  Medication Dose Route Frequency Provider Last Rate Last Admin   0.9 %  sodium chloride infusion   Intravenous Continuous  Mansouraty, Netty Starring., MD        Current Facility-Administered Medications:    0.9 %  sodium chloride infusion, , Intravenous, Continuous, Mansouraty, Netty Starring., MD Allergies  Allergen Reactions   Zoloft [Sertraline Hcl] Other (See Comments)    HYPERACTIVE   Quinolones Other (See Comments)    Aortic aneurysm--Did not feel well   Family History  Problem Relation Age of Onset   Arthritis Mother    Depression Mother    Migraines Mother    Cancer Father        prostate   Arthritis Father    Ulcers Father    Heart attack Father    Colon cancer Maternal Uncle 29   Social History   Socioeconomic History   Marital status: Married    Spouse name: Not on file   Number of children: Not on file   Years of education: Not on file   Highest education level: Not on file  Occupational History   Not on file  Tobacco Use   Smoking status: Never   Smokeless tobacco: Never  Substance and Sexual Activity   Alcohol use: Yes    Comment: socially   Drug use: No   Sexual activity: Not on file  Other Topics Concern   Not on file  Social History Narrative   ** Merged History Encounter **       Social Determinants of Health   Financial Resource Strain: Not on file  Food Insecurity: No Food Insecurity (01/27/2023)  Hunger Vital Sign    Worried About Running Out of Food in the Last Year: Never true    Ran Out of Food in the Last Year: Never true  Transportation Needs: No Transportation Needs (01/27/2023)   PRAPARE - Administrator, Civil Service (Medical): No    Lack of Transportation (Non-Medical): No  Physical Activity: Not on file  Stress: Not on file  Social Connections: Not on file  Intimate Partner Violence: Not At Risk (01/27/2023)   Humiliation, Afraid, Rape, and Kick questionnaire    Fear of Current or Ex-Partner: No    Emotionally Abused: No    Physically Abused: No    Sexually Abused: No    Physical Exam: Today's Vitals   02/06/23 0739  BP: (!)  143/96  Pulse: 68  Resp: 13  Temp: 98 F (36.7 C)  TempSrc: Tympanic  SpO2: 100%  Weight: 68.9 kg  Height: 5\' 8"  (1.727 m)  PainSc: 1    Body mass index is 23.1 kg/m. GEN: NAD EYE: Sclerae anicteric ENT: MMM CV: Non-tachycardic GI: Soft, NT/ND NEURO:  Alert & Oriented x 3  Lab Results: No results for input(s): "WBC", "HGB", "HCT", "PLT" in the last 72 hours. BMET No results for input(s): "NA", "K", "CL", "CO2", "GLUCOSE", "BUN", "CREATININE", "CALCIUM" in the last 72 hours. LFT No results for input(s): "PROT", "ALBUMIN", "AST", "ALT", "ALKPHOS", "BILITOT", "BILIDIR", "IBILI" in the last 72 hours. PT/INR No results for input(s): "LABPROT", "INR" in the last 72 hours.   Impression / Plan: This is a 62 y.o.male who presents for EGD/EUS to evaluate biliary obstruction.  The risks of an EUS including intestinal perforation, bleeding, infection, aspiration, and medication effects were discussed as was the possibility it may not give a definitive diagnosis if a biopsy is performed.  When a biopsy of the pancreas is done as part of the EUS, there is an additional risk of pancreatitis at the rate of about 1-2%.  It was explained that procedure related pancreatitis is typically mild, although it can be severe and even life threatening, which is why we do not perform random pancreatic biopsies and only biopsy a lesion/area we feel is concerning enough to warrant the risk.   The risks and benefits of endoscopic evaluation/treatment were discussed with the patient and/or family; these include but are not limited to the risk of perforation, infection, bleeding, missed lesions, lack of diagnosis, severe illness requiring hospitalization, as well as anesthesia and sedation related illnesses.  The patient's history has been reviewed, patient examined, no change in status, and deemed stable for procedure.  The patient and/or family is agreeable to proceed.    Corliss Parish, MD [place]  Gastroenterology Advanced Endoscopy Office # [phone]

## 2023-02-06 NOTE — Anesthesia Postprocedure Evaluation (Signed)
Anesthesia Post Note  Patient: Randy Allen  Procedure(s) Performed: UPPER ENDOSCOPIC ULTRASOUND (EUS) RADIAL ESOPHAGOGASTRODUODENOSCOPY (EGD) BIOPSY     Patient location during evaluation: PACU Anesthesia Type: MAC Level of consciousness: awake and alert Pain management: pain level controlled Vital Signs Assessment: post-procedure vital signs reviewed and stable Respiratory status: spontaneous breathing, nonlabored ventilation and respiratory function stable Cardiovascular status: stable and blood pressure returned to baseline Anesthetic complications: no   No notable events documented.  Last Vitals:  Vitals:   02/06/23 0917 02/06/23 0927  BP: 120/84 (!) 132/98  Pulse: 79 74  Resp: 20 15  Temp:    SpO2: 99% 99%    Last Pain:  Vitals:   02/06/23 0927  TempSrc:   PainSc: 0-No pain                 Beryle Lathe

## 2023-02-07 ENCOUNTER — Encounter (HOSPITAL_BASED_OUTPATIENT_CLINIC_OR_DEPARTMENT_OTHER): Payer: Self-pay | Admitting: General Surgery

## 2023-02-07 NOTE — Progress Notes (Addendum)
Spoke w/ via phone for pre-op interview---  pt Lab needs dos----   [employer] results------  current EKG in epic/ chart COVID test -----patient states asymptomatic no test needed Arrive at -------  0700 on 02-12-2023 NPO after MN NO Solid Food.  Clear liquids from MN until--- 0600 Med rec completed Medications to take morning of surgery ----- prilsoec, synthroid Diabetic medication ----- n/a Patient instructed no nail polish to be worn day of surgery Patient instructed to bring photo id and insurance card day of surgery Patient aware to have Driver (ride ) / caregiver    for 24 hours after surgery - wife, jackie Patient Special Instructions ----- n/a Pre-Op special Instructions ----- pt has telephone cardiac clearance by Edd Fabian NP on 02-05-2023 in epic/ chart Patient verbalized understanding of instructions that were given at this phone interview. Patient denies chest pain, sob, fever, cough at the interview.    Anesthesia Review: HTN; Bicuspid AV moderate stenosis, MS 21.5 mmHg, AVA 1.2 cm^2;  AAA ,(5.2 cm);  Pancreatic carcinoma dx 10/ 2024  Reviewed chart w/ anesthesia, Dr Malen Gauze MDA.  Dr Malen Gauze stated ok to proceed.  PCP:  Dr Lacretia Nicks. Oneta Rack ( lov 01-06-2023) Cardiologist : Dr Demetrius Charity. [facility] 06-24-2022) Cardiothoracic:  Dr Kathie Rhodes. Dorris Fetch [facility] 08-13-2022) Oncologist:  Dr Truett Perna [facility] 01-27-2023) Chest x-ray : [facility] 08-13-2022 EKG : 06-24-2022 Echo : 06-21-2022 Stress test: no Cardiac Cath : no Activity level:  denies sob w/ any activity Sleep Study/ CPAP :  no Blood Thinner/ Instructions /Last Dose:  no ASA / Instructions/ Last Dose : ASA 81 mg;   pt stated last dose 01-16-2023, on hold since.

## 2023-02-10 ENCOUNTER — Encounter: Payer: Self-pay | Admitting: Gastroenterology

## 2023-02-10 ENCOUNTER — Encounter (HOSPITAL_COMMUNITY): Payer: Self-pay | Admitting: Gastroenterology

## 2023-02-10 LAB — SURGICAL PATHOLOGY

## 2023-02-10 NOTE — H&P (Signed)
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  He had been having pain in the abdomen for several months and weight loss for several months before that.  He had an abdominal ultrasound in which the pancreas wasn't visualized due to gas, and then a CT in September that was negative.  Given elevated LFTs, MRCP was then performed. The MRI showed a pancreatic head mass.  There was some question of superior mesenteric vein involvement along with the portal confluence.  He had ERCP and stenting.  Bile duct brushings were positive for adenocarcinoma.   He still has issues with his appetite.  He is not able to eat any large meals.  He definitely lost more weight whenever he had the hospitalizations surrounding the jaundice.  He does think this is stabilized a little bit.   He denies any diarrhea.  He is having issues with his blood sugar.  He is checking them and they are elevated, but generally not over 200.  He is avoiding carbohydrates when possible.  He describes the abdominal pain as count of always there.  It is more of a soreness.  This is controlled generally with tramadol.  He tries to only take it at night to help him sleep.   Patient works with CAD software.     The patient is doing much better following the ERCP.   Patient has family cancer history of prostate cancer in father and colon cancer in maternal grandfather.       Cytology 01/18/2023 FINAL MICROSCOPIC DIAGNOSIS:  - Malignant cells present  - Adenocarcinoma    Imaging:  MR abd 01/2023 IMPRESSION: 1. Hypoenhancing mass within the central pancreatic head, which effaces the common bile duct and pancreatic ducts, measuring 2.6 x 2.4 cm. 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 2. Mass contacts the adjacent central superior mesenteric  vein and portal confluence with less than 180 degrees of contact. Preserved fat plane to the adjacent superior mesenteric artery. 3. Distended gallbladder containing sludge. 4. No evidence of lymphadenopathy or metastatic disease in the abdomen.   ERCP: 01/18/23 Marina Goodell Impression:  - A malignant appearing single biliary stricture was found in the lower third of the main bile duct as described.  - A biliary sphincterotomy was performed. - The stricture was brushed - Plastic stent was placed into the common bile duct.   Past Medical History      Past Medical History:  Diagnosis Date   Aneurysm (CMS-HCC)     Chronic kidney disease     History of cancer     Hyperlipidemia     Hypertension     Thyroid disease          Past Surgical History       Past Surgical History:  Procedure Laterality Date   Eye Surgery- unsure what kind   1966   EXCISION MASS NECK AND RIGHT SHOULDER   04/26/2015    Dr. Sheron Nightingale        Current Medications        Current Outpatient Medications  Medication Sig Dispense Refill   atorvastatin (LIPITOR) 40 MG tablet Take 1 tablet by mouth once daily       enalapril (VASOTEC) 10 MG tablet Take 1 tablet by mouth once daily       levothyroxine (SYNTHROID) 75 MCG  tablet Take  1 tablet  Daily  on an empty stomach with only water for 30 minutes & no Antacid meds, Calcium or Magnesium for 4 hours & avoid Biotin        No current facility-administered medications for this visit.          Allergies       Allergies  Allergen Reactions   Sertraline Hcl Other (See Comments)      HYPERACTIVE   Quinolones Other (See Comments)      Aortic aneurysm--Did not feel well          Family History        Family History  Problem Relation Name Age of Onset   Obesity Mother       Depression Mother       High blood pressure (Hypertension) Father       Hyperlipidemia (Elevated cholesterol) Father       Coronary Artery Disease (Blocked arteries around heart) Father        Colon cancer Maternal Uncle              Social History  Social History         Socioeconomic History   Marital status: Married  Tobacco Use   Smoking status: Former      Types: Cigarettes   Smokeless tobacco: Never  Substance and Sexual Activity   Alcohol use: Yes      Comment: 1-2 weekly   Drug use: Never    Social Drivers of Health        Food Insecurity: No Food Insecurity (01/27/2023)    Received from [facility]    Hunger Vital Sign     Worried About Running Out of Food in the Last Year: Never true     Ran Out of Food in the Last Year: Never true  Transportation Needs: No Transportation Needs (01/27/2023)    Received from [facility] - Transportation     Lack of Transportation (Medical): No     Lack of Transportation (Non-Medical): No          REVIEW OF SYSTEMS - PERTINENT POSITIVES ONLY: A complete review of systems negative other than HPI and PMH except for weight loss, abdominal pain, constipation   EXAM:    Vitals:    02/04/23 1350  BP: 118/80  Pulse: 86  Temp: 37 C (98.6 F)         Wt Readings from Last 3 Encounters:  02/04/23 67.4 kg (148 lb 9.6 oz)    Gen:  No acute distress.  Well nourished and well groomed.   Neurological: Alert and oriented to person, place, and time. Coordination normal.  Head: Normocephalic and atraumatic.  Eyes: Conjunctivae are normal. Pupils are equal, round, and reactive to light. No scleral icterus.  Neck: Normal range of motion. Neck supple. No tracheal deviation or thyromegaly present.  Cardiovascular: Normal rate, regular rhythm, normal heart sounds and intact distal pulses.  Exam reveals no gallop and no friction rub.  No murmur heard. Respiratory: Effort normal.  No respiratory distress. No chest wall tenderness. Breath sounds normal.  No wheezes, rales or rhonchi.  GI: Soft. Bowel sounds are normal. The abdomen is soft and nontender.  There is no rebound and no guarding.  Musculoskeletal:  Normal range of motion. Extremities are nontender.  Lymphadenopathy: No cervical, preauricular, postauricular or axillary adenopathy is present Skin: Skin is warm and dry. No rash noted. No diaphoresis. No erythema. No  pallor. No clubbing, cyanosis, or edema.   Psychiatric: Normal mood and affect. Behavior is normal. Judgment and thought content normal.      LABORATORY RESULTS: 01/24/23  CBC normal CMET with glucose 111, T bili down to 2.7, Albumin 4.2     RADIOLOGY RESULTS: See above.   ASSESSMENT AND PLAN:   Adenocarcinoma of head of pancreas (CMS/HHS-HCC)  (primary encounter diagnosis)   Obstructive jaundice (CMS-HCC)   Severe protein-calorie malnutrition (CMS/HHS-HCC)   Patient does have a new diagnosis of adenocarcinoma the pancreatic head.  This is clinical T2N0.  Patient has a endoscopic ultrasound set up this week.  This will give [place] a better look at the venous invasion versus abutment.   I do recommend neoadjuvant chemotherapy for this patient.  He is already seen oncology and is working on getting that set up.  I reviewed port placement including a diagram and model.  I discussed risk of surgery.  I reviewed risk of pneumothorax, clot, malposition, malfunction, infection, possible need for revision.   Following short course of neoadjuvant chemotherapy.  We discussed repeat imaging and then plan for diagnostic laparoscopy and Whipple.     I discussed the surgery with the patient including diagrams of anatomy.  I discussed the diagnostic laparoscopy.  In the case of pancreatic cancer, if spread of the disease is found, we will abort the procedure and not proceed with resection.  The rationale for this was discussed with the patient.  There has not been data to support resection of Stage IV disease in terms of survival benefit.     We discussed possible complications including: Potential of aborting procedure if tumor is invading the superior mesenteric or hepatic  arteries Bleeding Infection and possible wound complications such as hernia Damage to adjacent structures Leak of anastamoses, primarily pancreatic Possible need for other procedures Possible prolonged nausea with possible need for external feeding.   Possible prolonged hospital stay. Possible development of diabetes or worsening of current diabetes.  Possible pancreatic exocrine insufficiency Prolonged fatigue/weakness/appetite Possible early recurrence of cancer

## 2023-02-11 NOTE — Anesthesia Preprocedure Evaluation (Signed)
Anesthesia Evaluation  Patient identified by MRN, date of birth, ID band Patient awake    Reviewed: Allergy & Precautions, NPO status , Patient's Chart, lab work & pertinent test results  Airway Mallampati: II  TM Distance: >3 FB Neck ROM: Full    Dental no notable dental hx.    Pulmonary neg pulmonary ROS   Pulmonary exam normal        Cardiovascular hypertension, Pt. on medications + Peripheral Vascular Disease  + Valvular Problems/Murmurs AS  Rhythm:Regular Rate:Normal     Neuro/Psych negative neurological ROS  negative psych ROS   GI/Hepatic hiatal hernia, PUD,GERD  ,,Pancreatic Ca   Endo/Other  Hypothyroidism    Renal/GU   negative genitourinary   Musculoskeletal negative musculoskeletal ROS (+)    Abdominal Normal abdominal exam  (+)   Peds  Hematology Lab Results      Component                Value               Date                      WBC                      7.4                 01/24/2023                HGB                      14.7                01/24/2023                HCT                      44.6                01/24/2023                MCV                      89.6                01/24/2023                PLT                      304.0               01/24/2023              Anesthesia Other Findings   Reproductive/Obstetrics                             Anesthesia Physical Anesthesia Plan  ASA: 3  Anesthesia Plan: General   Post-op Pain Management:    Induction: Intravenous  PONV Risk Score and Plan: 1 and Ondansetron, Dexamethasone, Midazolam and Treatment may vary due to age or medical condition  Airway Management Planned: Mask and LMA  Additional Equipment: None  Intra-op Plan:   Post-operative Plan: Extubation in OR  Informed Consent: I have reviewed the patients History and Physical, chart, labs and discussed the procedure including the risks,  benefits and alternatives for the proposed anesthesia with the patient or  authorized representative who has indicated his/her understanding and acceptance.     Dental advisory given  Plan Discussed with: CRNA  Anesthesia Plan Comments:        Anesthesia Quick Evaluation

## 2023-02-12 ENCOUNTER — Encounter: Payer: Self-pay | Admitting: Oncology

## 2023-02-12 ENCOUNTER — Encounter (HOSPITAL_BASED_OUTPATIENT_CLINIC_OR_DEPARTMENT_OTHER): Payer: Self-pay | Admitting: General Surgery

## 2023-02-12 ENCOUNTER — Inpatient Hospital Stay: Payer: BC Managed Care – PPO

## 2023-02-12 ENCOUNTER — Inpatient Hospital Stay: Payer: BC Managed Care – PPO | Admitting: Oncology

## 2023-02-12 ENCOUNTER — Ambulatory Visit (HOSPITAL_BASED_OUTPATIENT_CLINIC_OR_DEPARTMENT_OTHER): Payer: BC Managed Care – PPO | Admitting: Anesthesiology

## 2023-02-12 ENCOUNTER — Telehealth: Payer: Self-pay | Admitting: Genetic Counselor

## 2023-02-12 ENCOUNTER — Ambulatory Visit (HOSPITAL_COMMUNITY): Payer: BC Managed Care – PPO

## 2023-02-12 ENCOUNTER — Ambulatory Visit (HOSPITAL_COMMUNITY)
Admission: RE | Admit: 2023-02-12 | Discharge: 2023-02-12 | Disposition: A | Discharge status: Home / Self Care | Payer: BC Managed Care – PPO | Attending: General Surgery | Admitting: General Surgery

## 2023-02-12 ENCOUNTER — Other Ambulatory Visit: Payer: Self-pay

## 2023-02-12 ENCOUNTER — Ambulatory Visit (HOSPITAL_BASED_OUTPATIENT_CLINIC_OR_DEPARTMENT_OTHER): Payer: Self-pay | Admitting: Anesthesiology

## 2023-02-12 ENCOUNTER — Encounter (HOSPITAL_BASED_OUTPATIENT_CLINIC_OR_DEPARTMENT_OTHER)
Admission: RE | Disposition: A | Discharge status: Home / Self Care | Payer: Self-pay | Source: Home / Self Care | Attending: General Surgery

## 2023-02-12 VITALS — BP 116/82 | HR 82 | Temp 98.1°F | Resp 18 | Ht 68.0 in | Wt 150.9 lb

## 2023-02-12 DIAGNOSIS — E039 Hypothyroidism, unspecified: Secondary | ICD-10-CM | POA: Insufficient documentation

## 2023-02-12 DIAGNOSIS — Z800 Family history of malignant neoplasm of digestive organs: Secondary | ICD-10-CM | POA: Insufficient documentation

## 2023-02-12 DIAGNOSIS — Z8507 Personal history of malignant neoplasm of pancreas: Secondary | ICD-10-CM | POA: Diagnosis not present

## 2023-02-12 DIAGNOSIS — E43 Unspecified severe protein-calorie malnutrition: Secondary | ICD-10-CM | POA: Diagnosis not present

## 2023-02-12 DIAGNOSIS — I739 Peripheral vascular disease, unspecified: Secondary | ICD-10-CM | POA: Diagnosis not present

## 2023-02-12 DIAGNOSIS — Z452 Encounter for adjustment and management of vascular access device: Secondary | ICD-10-CM | POA: Insufficient documentation

## 2023-02-12 DIAGNOSIS — Z8711 Personal history of peptic ulcer disease: Secondary | ICD-10-CM | POA: Insufficient documentation

## 2023-02-12 DIAGNOSIS — C250 Malignant neoplasm of head of pancreas: Secondary | ICD-10-CM | POA: Diagnosis not present

## 2023-02-12 DIAGNOSIS — Z8042 Family history of malignant neoplasm of prostate: Secondary | ICD-10-CM | POA: Diagnosis not present

## 2023-02-12 DIAGNOSIS — R109 Unspecified abdominal pain: Secondary | ICD-10-CM | POA: Diagnosis not present

## 2023-02-12 DIAGNOSIS — Z6822 Body mass index (BMI) 22.0-22.9, adult: Secondary | ICD-10-CM | POA: Insufficient documentation

## 2023-02-12 DIAGNOSIS — R222 Localized swelling, mass and lump, trunk: Secondary | ICD-10-CM | POA: Diagnosis not present

## 2023-02-12 DIAGNOSIS — K831 Obstruction of bile duct: Secondary | ICD-10-CM | POA: Diagnosis not present

## 2023-02-12 DIAGNOSIS — M549 Dorsalgia, unspecified: Secondary | ICD-10-CM | POA: Diagnosis not present

## 2023-02-12 DIAGNOSIS — R739 Hyperglycemia, unspecified: Secondary | ICD-10-CM | POA: Diagnosis not present

## 2023-02-12 DIAGNOSIS — R748 Abnormal levels of other serum enzymes: Secondary | ICD-10-CM | POA: Diagnosis not present

## 2023-02-12 DIAGNOSIS — Q2381 Bicuspid aortic valve: Secondary | ICD-10-CM | POA: Diagnosis not present

## 2023-02-12 DIAGNOSIS — Z419 Encounter for procedure for purposes other than remedying health state, unspecified: Secondary | ICD-10-CM

## 2023-02-12 DIAGNOSIS — Z79899 Other long term (current) drug therapy: Secondary | ICD-10-CM | POA: Diagnosis not present

## 2023-02-12 DIAGNOSIS — Z87891 Personal history of nicotine dependence: Secondary | ICD-10-CM | POA: Diagnosis not present

## 2023-02-12 DIAGNOSIS — C259 Malignant neoplasm of pancreas, unspecified: Secondary | ICD-10-CM | POA: Diagnosis not present

## 2023-02-12 DIAGNOSIS — I10 Essential (primary) hypertension: Secondary | ICD-10-CM | POA: Diagnosis not present

## 2023-02-12 HISTORY — DX: Gastro-esophageal reflux disease without esophagitis: K21.9

## 2023-02-12 HISTORY — DX: Unspecified severe protein-calorie malnutrition: E43

## 2023-02-12 HISTORY — DX: Malignant (primary) neoplasm, unspecified: C80.1

## 2023-02-12 HISTORY — PX: PORTACATH PLACEMENT: SHX2246

## 2023-02-12 HISTORY — DX: Presence of spectacles and contact lenses: Z97.3

## 2023-02-12 HISTORY — DX: Generalized abdominal pain: R10.84

## 2023-02-12 LAB — CBC WITH DIFFERENTIAL (CANCER CENTER ONLY)
Abs Immature Granulocytes: 0.02 10*3/uL (ref 0.00–0.07)
Basophils Absolute: 0 10*3/uL (ref 0.0–0.1)
Basophils Relative: 0 %
Eosinophils Absolute: 0 10*3/uL (ref 0.0–0.5)
Eosinophils Relative: 0 %
HCT: 42.5 % (ref 39.0–52.0)
Hemoglobin: 14.3 g/dL (ref 13.0–17.0)
Immature Granulocytes: 0 %
Lymphocytes Relative: 7 %
Lymphs Abs: 0.5 10*3/uL — ABNORMAL LOW (ref 0.7–4.0)
MCH: 29.9 pg (ref 26.0–34.0)
MCHC: 33.6 g/dL (ref 30.0–36.0)
MCV: 88.9 fL (ref 80.0–100.0)
Monocytes Absolute: 0 10*3/uL — ABNORMAL LOW (ref 0.1–1.0)
Monocytes Relative: 1 %
Neutro Abs: 5.9 10*3/uL (ref 1.7–7.7)
Neutrophils Relative %: 92 %
Platelet Count: 221 10*3/uL (ref 150–400)
RBC: 4.78 MIL/uL (ref 4.22–5.81)
RDW: 13.7 % (ref 11.5–15.5)
WBC Count: 6.5 10*3/uL (ref 4.0–10.5)
nRBC: 0 % (ref 0.0–0.2)

## 2023-02-12 LAB — CMP (CANCER CENTER ONLY)
ALT: 22 U/L (ref 0–44)
AST: 18 U/L (ref 15–41)
Albumin: 4.4 g/dL (ref 3.5–5.0)
Alkaline Phosphatase: 131 U/L — ABNORMAL HIGH (ref 38–126)
Anion gap: 8 (ref 5–15)
BUN: 12 mg/dL (ref 8–23)
CO2: 26 mmol/L (ref 22–32)
Calcium: 9.6 mg/dL (ref 8.9–10.3)
Chloride: 102 mmol/L (ref 98–111)
Creatinine: 0.99 mg/dL (ref 0.61–1.24)
GFR, Estimated: >60 mL/min (ref >60)
Glucose, Bld: 267 mg/dL — ABNORMAL HIGH (ref 70–99)
Potassium: 3.8 mmol/L (ref 3.5–5.1)
Sodium: 136 mmol/L (ref 135–145)
Total Bilirubin: 1.2 mg/dL (ref 0.3–1.2)
Total Protein: 6.6 g/dL (ref 6.5–8.1)

## 2023-02-12 LAB — POCT I-STAT, CHEM 8
BUN: 11 mg/dL (ref 8–23)
Calcium, Ion: 1.21 mmol/L (ref 1.15–1.40)
Chloride: 105 mmol/L (ref 98–111)
Creatinine, Ser: 0.9 mg/dL (ref 0.61–1.24)
Glucose, Bld: 115 mg/dL — ABNORMAL HIGH (ref 70–99)
HCT: 42 % (ref 39.0–52.0)
Hemoglobin: 14.3 g/dL (ref 13.0–17.0)
Potassium: 3.8 mmol/L (ref 3.5–5.1)
Sodium: 139 mmol/L (ref 135–145)
TCO2: 23 mmol/L (ref 22–32)

## 2023-02-12 SURGERY — INSERTION, TUNNELED CENTRAL VENOUS DEVICE, WITH PORT
Anesthesia: Monitor Anesthesia Care | Site: Chest

## 2023-02-12 MED ORDER — LIDOCAINE HCL 1 % IJ SOLN
INTRAMUSCULAR | Status: DC | PRN
  Administered 2023-02-12: 10 mL via SURGICAL_CAVITY

## 2023-02-12 MED ORDER — CEFAZOLIN SODIUM-DEXTROSE 2-4 GM/100ML-% IV SOLN
INTRAVENOUS | Status: AC
  Filled 2023-02-12: qty 100

## 2023-02-12 MED ORDER — CEFAZOLIN SODIUM-DEXTROSE 2-4 GM/100ML-% IV SOLN
2.0000 g | INTRAVENOUS | Status: AC
  Administered 2023-02-12: 2 g via INTRAVENOUS

## 2023-02-12 MED ORDER — SODIUM CHLORIDE 0.9 % IV SOLN: INTRAVENOUS | Status: DC | PRN

## 2023-02-12 MED ORDER — FENTANYL CITRATE (PF) 100 MCG/2ML IJ SOLN
INTRAMUSCULAR | Status: AC
  Filled 2023-02-12: qty 2

## 2023-02-12 MED ORDER — EPHEDRINE SULFATE-NACL 50-0.9 MG/10ML-% IV SOSY
PREFILLED_SYRINGE | INTRAVENOUS | Status: DC | PRN
  Administered 2023-02-12 (×5): 5 mg via INTRAVENOUS

## 2023-02-12 MED ORDER — PROPOFOL 10 MG/ML IV BOLUS
INTRAVENOUS | Status: AC
Start: 2023-02-12 — End: ?
  Filled 2023-02-12: qty 20

## 2023-02-12 MED ORDER — FENTANYL CITRATE (PF) 100 MCG/2ML IJ SOLN
INTRAMUSCULAR | Status: DC | PRN
  Administered 2023-02-12: 50 ug via INTRAVENOUS

## 2023-02-12 MED ORDER — ACETAMINOPHEN 500 MG PO TABS
ORAL_TABLET | ORAL | Status: AC
  Filled 2023-02-12: qty 2

## 2023-02-12 MED ORDER — PROCHLORPERAZINE MALEATE 10 MG PO TABS: 10.0000 mg | ORAL_TABLET | Freq: Four times a day (QID) | ORAL | 1 refills | Status: DC | PRN

## 2023-02-12 MED ORDER — HEPARIN SOD (PORK) LOCK FLUSH 100 UNIT/ML IV SOLN
INTRAVENOUS | Status: DC | PRN
Start: 2023-02-12 — End: 2023-02-12
  Administered 2023-02-12: 500 [IU] via INTRAVENOUS

## 2023-02-12 MED ORDER — MIDAZOLAM HCL 5 MG/5ML IJ SOLN
INTRAMUSCULAR | Status: DC | PRN
  Administered 2023-02-12: 2 mg via INTRAVENOUS

## 2023-02-12 MED ORDER — DEXAMETHASONE SODIUM PHOSPHATE 10 MG/ML IJ SOLN
INTRAMUSCULAR | Status: DC | PRN
  Administered 2023-02-12: 10 mg via INTRAVENOUS

## 2023-02-12 MED ORDER — PHENYLEPHRINE 80 MCG/ML (10ML) SYRINGE FOR IV PUSH (FOR BLOOD PRESSURE SUPPORT)
PREFILLED_SYRINGE | INTRAVENOUS | Status: AC
  Filled 2023-02-12: qty 10

## 2023-02-12 MED ORDER — MIDAZOLAM HCL 2 MG/2ML IJ SOLN
INTRAMUSCULAR | Status: AC
  Filled 2023-02-12: qty 2

## 2023-02-12 MED ORDER — LIDOCAINE-PRILOCAINE 2.5-2.5 % EX CREA: 1.0000 | TOPICAL_CREAM | CUTANEOUS | 3 refills | Status: DC | PRN

## 2023-02-12 MED ORDER — ONDANSETRON HCL 8 MG PO TABS: 8.0000 mg | ORAL_TABLET | Freq: Three times a day (TID) | ORAL | 1 refills | Status: DC | PRN

## 2023-02-12 MED ORDER — ACETAMINOPHEN 500 MG PO TABS: 1000.0000 mg | ORAL_TABLET | Freq: Once | ORAL | Status: DC

## 2023-02-12 MED ORDER — DEXAMETHASONE SODIUM PHOSPHATE 10 MG/ML IJ SOLN
INTRAMUSCULAR | Status: AC
Start: 2023-02-12 — End: ?
  Filled 2023-02-12: qty 1

## 2023-02-12 MED ORDER — LIDOCAINE 2% (20 MG/ML) 5 ML SYRINGE
INTRAMUSCULAR | Status: DC | PRN
  Administered 2023-02-12: 100 mg via INTRAVENOUS

## 2023-02-12 MED ORDER — OXYCODONE HCL 5 MG PO TABS: 5.0000 mg | ORAL_TABLET | Freq: Four times a day (QID) | ORAL | 0 refills | Status: DC | PRN

## 2023-02-12 MED ORDER — CHLORHEXIDINE GLUCONATE CLOTH 2 % EX PADS: 6.0000 | MEDICATED_PAD | Freq: Once | CUTANEOUS | Status: DC

## 2023-02-12 MED ORDER — ACETAMINOPHEN 500 MG PO TABS
1000.0000 mg | ORAL_TABLET | ORAL | Status: AC
  Administered 2023-02-12: 1000 mg via ORAL

## 2023-02-12 MED ORDER — EPHEDRINE 5 MG/ML INJ
INTRAVENOUS | Status: AC
Start: 2023-02-12 — End: ?
  Filled 2023-02-12: qty 5

## 2023-02-12 MED ORDER — PHENYLEPHRINE 80 MCG/ML (10ML) SYRINGE FOR IV PUSH (FOR BLOOD PRESSURE SUPPORT)
PREFILLED_SYRINGE | INTRAVENOUS | Status: DC | PRN
  Administered 2023-02-12: 80 ug via INTRAVENOUS

## 2023-02-12 MED ORDER — ONDANSETRON HCL 4 MG/2ML IJ SOLN
INTRAMUSCULAR | Status: DC | PRN
  Administered 2023-02-12: 4 mg via INTRAVENOUS

## 2023-02-12 MED ORDER — ONDANSETRON HCL 4 MG/2ML IJ SOLN
INTRAMUSCULAR | Status: AC
  Filled 2023-02-12: qty 2

## 2023-02-12 MED ORDER — PROPOFOL 10 MG/ML IV BOLUS
INTRAVENOUS | Status: DC | PRN
  Administered 2023-02-12: 20 mg via INTRAVENOUS
  Administered 2023-02-12: 150 mg via INTRAVENOUS

## 2023-02-12 MED ORDER — HEPARIN 6000 UNIT IRRIGATION SOLUTION
Freq: Once | Status: AC
  Administered 2023-02-12: 1
  Filled 2023-02-12: qty 6000

## 2023-02-12 MED ORDER — LIDOCAINE HCL (PF) 2 % IJ SOLN
INTRAMUSCULAR | Status: AC
Start: 2023-02-12 — End: ?
  Filled 2023-02-12: qty 5

## 2023-02-12 SURGICAL SUPPLY — 40 items
ADH SKN CLS APL DERMABOND .7 (GAUZE/BANDAGES/DRESSINGS) ×1
APL PRP STRL LF DISP 70% ISPRP (MISCELLANEOUS) ×1
BAG DECANTER FOR FLEXI CONT (MISCELLANEOUS) ×1 IMPLANT
BLADE SURG 11 STRL SS (BLADE) ×1 IMPLANT
BLADE SURG 15 STRL LF DISP TIS (BLADE) ×1 IMPLANT
BLADE SURG 15 STRL SS (BLADE) ×1
CHLORAPREP W/TINT 26 (MISCELLANEOUS) ×1 IMPLANT
COVER BACK TABLE 60X90IN (DRAPES) ×1 IMPLANT
COVER MAYO STAND STRL (DRAPES) ×1 IMPLANT
DERMABOND ADVANCED .7 DNX12 (GAUZE/BANDAGES/DRESSINGS) ×1 IMPLANT
DRAPE C-ARM 42X120 X-RAY (DRAPES) ×1 IMPLANT
DRAPE LAPAROTOMY TRNSV 102X78 (DRAPES) ×1 IMPLANT
DRAPE UTILITY XL STRL (DRAPES) ×1 IMPLANT
DRSG TEGADERM 4X4.75 (GAUZE/BANDAGES/DRESSINGS) IMPLANT
ELECT REM PT RETURN 9FT ADLT (ELECTROSURGICAL) ×1 IMPLANT
ELECTRODE REM PT RTRN 9FT ADLT (ELECTROSURGICAL) ×1 IMPLANT
GAUZE 4X4 16PLY ~~LOC~~+RFID DBL (SPONGE) ×1 IMPLANT
GAUZE SPONGE 4X4 12PLY STRL (GAUZE/BANDAGES/DRESSINGS) IMPLANT
GLOVE BIO SURGEON STRL SZ 6 (GLOVE) ×1 IMPLANT
GLOVE INDICATOR 6.5 STRL GRN (GLOVE) ×1 IMPLANT
GOWN STRL REUS W/TWL 2XL LVL3 (GOWN DISPOSABLE) ×1 IMPLANT
KIT PORT INFUSION SMART 8FR (Port) IMPLANT
KIT TURNOVER CYSTO (KITS) ×1 IMPLANT
MANIFOLD NEPTUNE II (INSTRUMENTS) IMPLANT
NDL HYPO 25X1 1.5 SAFETY (NEEDLE) ×1 IMPLANT
NEEDLE HYPO 25X1 1.5 SAFETY (NEEDLE) ×1 IMPLANT
PACK BASIN DAY SURGERY FS (CUSTOM PROCEDURE TRAY) ×1 IMPLANT
PENCIL SMOKE EVACUATOR (MISCELLANEOUS) ×1 IMPLANT
SLEEVE SCD COMPRESS KNEE MED (STOCKING) ×2 IMPLANT
SPIKE FLUID TRANSFER (MISCELLANEOUS) IMPLANT
SUT MNCRL AB 4-0 PS2 18 (SUTURE) ×1 IMPLANT
SUT PROLENE 2 0 SH DA (SUTURE) ×2 IMPLANT
SUT VIC AB 3-0 SH 27 (SUTURE) ×1
SUT VIC AB 3-0 SH 27X BRD (SUTURE) ×1 IMPLANT
SUT VIC AB 3-0 SH 8-18 (SUTURE) IMPLANT
SYR 10ML LL (SYRINGE) ×1 IMPLANT
SYR 5ML LUER SLIP (SYRINGE) ×1 IMPLANT
SYR CONTROL 10ML LL (SYRINGE) ×1 IMPLANT
TOWEL OR 17X24 6PK STRL BLUE (TOWEL DISPOSABLE) ×1 IMPLANT
TUBE CONNECTING 12X1/4 (SUCTIONS) IMPLANT

## 2023-02-12 NOTE — Progress Notes (Signed)
[facility] OFFICE PROGRESS NOTE   Diagnosis: Pancreas cancer  INTERVAL HISTORY:   Mr. Prestenbach returns as scheduled.  He continues to have abdominal pain.  The pain is improved when he eats.  He began Prilosec after he was found to have ulcers at the EUS procedure.  He has intermittent back pain, chiefly at night.  He underwent Port-A-Cath placement earlier today.  Objective:  Vital signs in last 24 hours:  Blood pressure 116/82, pulse 82, temperature 98.1 F (36.7 C), temperature source Temporal, resp. rate 18, height 5\' 8"  (1.727 m), weight 150 lb 14.4 oz (68.4 kg), SpO2 100%.   Resp: Lungs clear bilaterally Cardio: Regular rate and rhythm GI: No hepatosplenomegaly, no mass, nontender Vascular: No leg edema   Portacath/PICC-without erythema  Lab Results:  Lab Results  Component Value Date   WBC 6.5 02/12/2023   HGB 14.3 02/12/2023   HCT 42.5 02/12/2023   MCV 88.9 02/12/2023   PLT 221 02/12/2023   NEUTROABS 5.9 02/12/2023    CMP  Lab Results  Component Value Date   NA 136 02/12/2023   K 3.8 02/12/2023   CL 102 02/12/2023   CO2 26 02/12/2023   GLUCOSE 267 (H) 02/12/2023   BUN 12 02/12/2023   CREATININE 0.99 02/12/2023   CALCIUM 9.6 02/12/2023   PROT 6.6 02/12/2023   ALBUMIN 4.4 02/12/2023   AST 18 02/12/2023   ALT 22 02/12/2023   ALKPHOS 131 (H) 02/12/2023   BILITOT 1.2 02/12/2023   GFRNONAA >60 02/12/2023   GFRAA 97 07/04/2020    No results found for: "CEA1", "CEA", "WFU932", "CA125"  Lab Results  Component Value Date   INR 1.1 01/17/2023   LABPROT 14.4 01/17/2023    Imaging:  DG CHEST PORT 1 VIEW  Result Date: 02/12/2023 CLINICAL DATA:  62 year old male Port-A-Cath placement. Pancreatic mass. EXAM: PORTABLE CHEST 1 VIEW COMPARISON:  Chest CT 08/13/2022. FINDINGS: Portable AP semi upright view at 1031 hours. Left chest subclavian approach power port in place. No catheter discontinuity. Catheter tip is at the level of the carina,  lower SVC. Mediastinal contours appear stable and within normal limits. Allowing for portable technique the lungs are clear. No pneumothorax or pleural effusion identified. No acute osseous abnormality identified. Negative visible bowel gas. IMPRESSION: 1. Left chest subclavian approach power port placed with no pneumothorax or adverse features. 2. No acute cardiopulmonary abnormality. Electronically Signed   By: Odessa Fleming M.D.   On: 02/12/2023 12:14   DG C-Arm 1-60 Min-No Report  Result Date: 02/12/2023 Fluoroscopy was utilized by the requesting physician.  No radiographic interpretation.    Medications: I have reviewed the patient's current medications.   Assessment/Plan: Pancreas cancer Elevated liver enzymes/bilirubin September 2024 CT abdomen/pelvis 01/10/2023-no focal liver abnormality, no biliary dilation, pancreas unremarkable MRI/MRCP 01/15/2023-mild intra and extrahepatic biliary duct dilation, common duct is abruptly truncated in the superior pancreas head, hypoenhancing mass in the central pancreas head measuring 2.6 x 2.4 cm, remaining pancreas is atrophic with dilation of the main pancreatic duct, mass contacts the adjacent central SMV and portal confluence with less than 180 degrees of contact, preserved fat plane to the SMA with no enlarged abdominal lymph nodes, no ascites ERCP 01/18/2023-single stenosis in the lower third of the main bile duct, brushings obtained, plastic stent placed-adenocarcinoma EUS 02/06/2023-30 x 30 mm pancreas head mass with irregular outer margins, abutment of the portal vein and SMV with intact fat planes, intact interface between the mass and SMA and celiac trunk, common  bile duct stent, no malignant appearing lymph nodes, T2 N0 Abdomen/back pain secondary to #1 Bicuspid aortic valve Hypertension Antral gastritis and ulcers on EUS 02/06/2023-Prilosec Hyperglycemia     Disposition: Mr. Suda has been diagnosed with pancreas cancer.  He appears to have  a tumor localized to the pancreas head.  There is abutment of the SMV and portal confluence.  The tumor appears to be resectable versus borderline resectable.  Dr. Donell Beers recommends neoadjuvant chemotherapy.  I discussed the rationale for neoadjuvant therapy with Mr. Borton.  The goals of neoadjuvant therapy or to increase the chance of tumor resectability and to increase the cure rate.  He underwent Port-A-Cath placement earlier today.  I recommend FOLFIRINOX.  We reviewed the potential toxicities associated with the FOLFIRINOX regimen including the chance of nausea/vomiting, mucositis, alopecia, diarrhea, hematologic toxicity, infection, and bleeding.  We discussed the sun sensitivity, rash, hyperpigmentation, hand/foot syndrome, and cardiac toxicity associated with 5-fluorouracil.  We reviewed the acute/delayed diarrhea seen with irinotecan.  We discussed the allergic reaction and various types of neuropathy associated with oxaliplatin.  We discussed the chance of developing hyperglycemia after receiving Decadron.  He agrees to proceed.  He will attend a chemotherapy teaching class.  Mr. Silkworth will be scheduled for cycle 1 FOLFIRINOX 02/19/2023.  He will return for an office visit prior to cycle 2.   He plans to avoid concentrated sweets in an attempt to prevent hyperglycemia.  He will be referred to the genetics counselor.  A chemotherapy plan was entered today.  Mr. Galo will continue tramadol as needed for abdomen/back pain.  Thornton Papas, MD  02/12/2023  3:06 PM

## 2023-02-12 NOTE — Anesthesia Postprocedure Evaluation (Signed)
Anesthesia Post Note  Patient: Randy Allen  Procedure(s) Performed: INSERTION PORT-A-CATH (Chest)     Patient location during evaluation: PACU Anesthesia Type: General Level of consciousness: awake and alert Pain management: pain level controlled Vital Signs Assessment: post-procedure vital signs reviewed and stable Respiratory status: spontaneous breathing, nonlabored ventilation, respiratory function stable and patient connected to nasal cannula oxygen Cardiovascular status: blood pressure returned to baseline and stable Postop Assessment: no apparent nausea or vomiting Anesthetic complications: no   No notable events documented.  Last Vitals:  Vitals:   02/12/23 1059 02/12/23 1143  BP: 135/79 119/83  Pulse: 73 73  Resp: 16 16  Temp:  (!) 36.3 C  SpO2: 97% 100%    Last Pain:  Vitals:   02/12/23 1143  TempSrc:   PainSc: 0-No pain                 Earl Lites P Sylvie Mifsud

## 2023-02-12 NOTE — Anesthesia Procedure Notes (Signed)
Procedure Name: LMA Insertion Date/Time: 02/12/2023 9:20 AM  Performed by: Briant Sites, CRNAPre-anesthesia Checklist: Patient identified, Emergency Drugs available, Suction available and Patient being monitored Patient Re-evaluated:Patient Re-evaluated prior to induction Oxygen Delivery Method: Circle system utilized Preoxygenation: Pre-oxygenation with 100% oxygen Induction Type: IV induction Ventilation: Mask ventilation without difficulty LMA: LMA inserted LMA Size: 4.0 Number of attempts: 1 Airway Equipment and Method: Bite block Placement Confirmation: positive ETCO2 Tube secured with: Tape Dental Injury: Teeth and Oropharynx as per pre-operative assessment

## 2023-02-12 NOTE — Progress Notes (Signed)
START ON PATHWAY REGIMEN - Pancreatic Adenocarcinoma     A cycle is every 14 days:     Irinotecan      Oxaliplatin      Leucovorin      Fluorouracil   **Always confirm dose/schedule in your pharmacy ordering system**  Patient Characteristics: Preoperative, M0 (Clinical Staging), Borderline Resectable, PS = 0,1, BRCA1/2 and PALB2 Mutation Absent/Unknown Therapeutic Status: Preoperative, M0 (Clinical Staging) AJCC T Category: cT2 AJCC N Category: cN0 Resectability Status: Borderline Resectable AJCC M Category: cM0 AJCC 8 Stage Grouping: IB ECOG Performance Status: 1 BRCA1/2 Mutation Status: Awaiting Test Results PALB2 Mutation Status: Awaiting Test Results Intent of Therapy: Curative Intent, Discussed with Patient 

## 2023-02-12 NOTE — Transfer of Care (Signed)
Immediate Anesthesia Transfer of Care Note  Patient: Randy Allen  Procedure(s) Performed: INSERTION PORT-A-CATH (Chest)  Patient Location: PACU  Anesthesia Type:General  Level of Consciousness: awake, alert , and oriented  Airway & Oxygen Therapy: Patient Spontanous Breathing  Post-op Assessment: Report given to RN and Post -op Vital signs reviewed and stable  Post vital signs: Reviewed and stable  Last Vitals:  Vitals Value Taken Time  BP 115/78 02/12/23 1005  Temp    Pulse 81 02/12/23 1005  Resp 19 02/12/23 1005  SpO2 99 % 02/12/23 1005  Vitals shown include unfiled device data.  Last Pain:  Vitals:   02/12/23 0710  TempSrc: Oral  PainSc: 3       Patients Stated Pain Goal: 6 (02/12/23 0710)  Complications: No notable events documented.

## 2023-02-12 NOTE — Discharge Instructions (Addendum)
[facility] Office Phone Number [phone]  PLEASE DO NOT TAKE TYLENOL UNTIL AFTER 1:30 pm today.    POST OP INSTRUCTIONS  Always review your discharge instruction sheet given to you by the facility where your surgery was performed.  IF YOU HAVE DISABILITY OR FAMILY LEAVE FORMS, YOU MUST BRING THEM TO THE OFFICE FOR PROCESSING.  DO NOT GIVE THEM TO YOUR DOCTOR.  Take 2 tylenol (acetominophen) three times a day for 3 days.  If you still have pain, add ibuprofen with food in between if able to take this (if you have kidney issues or stomach issues, do not take ibuprofen).  If both of those are not enough, add the narcotic pain pill.  If you find you are needing a lot of this overnight after surgery, call the next morning for a refill.   Take your usually prescribed medications unless otherwise directed If you need a refill on your pain medication, please contact your pharmacy.  They will contact our office to request authorization.  Prescriptions will not be filled after 5pm or on week-ends. You should eat very light the first 24 hours after surgery, such as soup, crackers, pudding, etc.  Resume your normal diet the day after surgery It is common to experience some constipation if taking pain medication after surgery.  Increasing fluid intake and taking a stool softener will usually help or prevent this problem from occurring.  A mild laxative (Milk of Magnesia or Miralax) should be taken according to package directions if there are no bowel movements after 48 hours. You may shower in 48 hours.  The surgical glue will flake off in 2-3 weeks.   ACTIVITIES:  No strenuous activity or heavy lifting for 1 week.   You may drive when you no longer are taking prescription pain medication, you can comfortably wear a seatbelt, and you can safely maneuver your car and apply brakes. RETURN TO WORK:  __________as tolerated_______________ Randy Allen should see your doctor in the office for a follow-up  appointment approximately three-four weeks after your surgery.    WHEN TO CALL YOUR DOCTOR: Fever over 101.0 Nausea and/or vomiting. Extreme swelling or bruising. Continued bleeding from incision. Increased pain, redness, or drainage from the incision.  The clinic staff is available to answer your questions during regular business hours.  Please don't hesitate to call and ask to speak to one of the nurses for clinical concerns.  If you have a medical emergency, go to the nearest emergency room or call 911.  A surgeon from [facility] is always on call at the hospital.  For further questions, please visit [email or web address]   Post Anesthesia Home Care Instructions  Activity: Get plenty of rest for the remainder of the day. A responsible adult should stay with you for 24 hours following the procedure.  For the next 24 hours, DO NOT: -Drive a car -Advertising copywriter -Drink alcoholic beverages -Take any medication unless instructed by your physician -Make any legal decisions or sign important papers.  Meals: Start with liquid foods such as gelatin or soup. Progress to regular foods as tolerated. Avoid greasy, spicy, heavy foods. If nausea and/or vomiting occur, drink only clear liquids until the nausea and/or vomiting subsides. Call your physician if vomiting continues.  Special Instructions/Symptoms: Your throat may feel dry or sore from the anesthesia or the breathing tube placed in your throat during surgery. If this causes discomfort, gargle with warm salt water. The discomfort should disappear within 24 hours.

## 2023-02-12 NOTE — Telephone Encounter (Signed)
Blood was drawn for POC genetic testing and [facility] Cancer[facility]-Expanded Panel was ordered on 02/12/2023.   Lalla Brothers, MS, [facility] Genetic Counselor [email or web address] (P) [phone]

## 2023-02-12 NOTE — Op Note (Signed)
PREOPERATIVE DIAGNOSIS:  adenocarcinoma pancreatic head [id number]     POSTOPERATIVE DIAGNOSIS:  Same     PROCEDURE: left subclavian port placement, Angiodynamics 8-French.      SURGEON:  [name], MD      ANESTHESIA:  General   FINDINGS:  Good venous return, easy flush, and tip of the catheter and   SVC 24 cm.      SPECIMEN:  None.      ESTIMATED BLOOD LOSS:  Minimal.      COMPLICATIONS:  None known.      PROCEDURE:  Pt was identified in the holding area and taken to   the operating room, where patient was placed supine on the operating room   table.  General anesthesia was induced.  Patient's arms were tucked and the upper  chest and neck were prepped and draped in sterile fashion.  Time-out was   performed according to the surgical safety check list.  When all was   correct, we continued.   Local anesthetic was administered just under the angle of the left clavicle.  The vein was accessed with 1 pass(es) of the needle. There was good venous return and the wire passed easily with no ectopy.   Fluoroscopy was used to confirm that the wire was in the vena cava.      The patient was placed back level and the area for the pocket was anethetized   with local anesthetic.  A 3-cm transverse incision was made with a #15   blade.  Cautery was used to divide the subcutaneous tissues down to the   pectoralis muscle.  An [employer] retractor was used to elevate the skin   while a pocket was created on top of the pectoralis fascia.  The port   was placed into the pocket to confirm that it was of adequate size.  The   catheter was preattached to the port.  The port was then secured to the   pectoralis fascia with four 2-0 Prolene sutures.  These were clamped and   not tied down yet.    The catheter was tunneled through to the wire exit   site.  The catheter was placed along the wire to determine what length it should be to be in the SVC.  The catheter was cut at 24 cm.  The tunneler sheath  and dilator were passed over the wire and the dilator and wire were removed.  The catheter was advanced through the tunneler sheath and the tunneler sheath was pulled away.  Care was taken to keep the catheter in the tunneler sheath as this occurred. This was advanced and the tunneler sheath was removed.  There was good venous   return and easy flush of the catheter.  The Prolene sutures were tied   down to the pectoral fascia.  The skin was reapproximated using 3-0   Vicryl interrupted deep dermal sutures.    Fluoroscopy was used to re-confirm good position of the catheter.  The skin   was then closed using 4-0 Monocryl in a subcuticular fashion.  The port was flushed with concentrated heparin flush as well.  The wounds were then cleaned, dried, and dressed with Dermabond.  The patient was awakened from anesthesia and taken to the PACU in stable condition.  Needle, sponge, and instrument counts were correct.               [name], MD

## 2023-02-12 NOTE — Interval H&P Note (Signed)
History and Physical Interval Note:  02/12/2023 8:58 AM  Randy Allen  has presented today for surgery, with the diagnosis of PANCREATIC CANCER.  The various methods of treatment have been discussed with the patient and family. After consideration of risks, benefits and other options for treatment, the patient has consented to  Procedure(s): INSERTION PORT-A-CATH (N/A) as a surgical intervention.  The patient's history has been reviewed, patient examined, no change in status, stable for surgery.  I have reviewed the patient's chart and labs.  Questions were answered to the patient's satisfaction.     [name]

## 2023-02-13 ENCOUNTER — Other Ambulatory Visit: Payer: Self-pay

## 2023-02-14 ENCOUNTER — Encounter (HOSPITAL_BASED_OUTPATIENT_CLINIC_OR_DEPARTMENT_OTHER): Payer: Self-pay | Admitting: General Surgery

## 2023-02-15 ENCOUNTER — Other Ambulatory Visit: Payer: Self-pay | Admitting: Oncology

## 2023-02-17 ENCOUNTER — Encounter: Payer: Self-pay | Admitting: Oncology

## 2023-02-17 ENCOUNTER — Other Ambulatory Visit (HOSPITAL_COMMUNITY): Payer: Self-pay

## 2023-02-18 ENCOUNTER — Inpatient Hospital Stay: Payer: BC Managed Care – PPO | Attending: Oncology

## 2023-02-18 DIAGNOSIS — Z452 Encounter for adjustment and management of vascular access device: Secondary | ICD-10-CM | POA: Insufficient documentation

## 2023-02-18 DIAGNOSIS — R739 Hyperglycemia, unspecified: Secondary | ICD-10-CM | POA: Insufficient documentation

## 2023-02-18 DIAGNOSIS — I10 Essential (primary) hypertension: Secondary | ICD-10-CM | POA: Insufficient documentation

## 2023-02-18 DIAGNOSIS — Q2381 Bicuspid aortic valve: Secondary | ICD-10-CM | POA: Insufficient documentation

## 2023-02-18 DIAGNOSIS — D701 Agranulocytosis secondary to cancer chemotherapy: Secondary | ICD-10-CM | POA: Insufficient documentation

## 2023-02-18 DIAGNOSIS — C250 Malignant neoplasm of head of pancreas: Secondary | ICD-10-CM | POA: Insufficient documentation

## 2023-02-18 DIAGNOSIS — Z5189 Encounter for other specified aftercare: Secondary | ICD-10-CM | POA: Insufficient documentation

## 2023-02-18 DIAGNOSIS — Z5111 Encounter for antineoplastic chemotherapy: Secondary | ICD-10-CM | POA: Insufficient documentation

## 2023-02-19 ENCOUNTER — Telehealth: Payer: Self-pay

## 2023-02-19 ENCOUNTER — Inpatient Hospital Stay: Payer: BC Managed Care – PPO

## 2023-02-19 VITALS — BP 130/96 | HR 70 | Temp 97.8°F | Resp 18

## 2023-02-19 DIAGNOSIS — R739 Hyperglycemia, unspecified: Secondary | ICD-10-CM | POA: Diagnosis not present

## 2023-02-19 DIAGNOSIS — Z5111 Encounter for antineoplastic chemotherapy: Secondary | ICD-10-CM | POA: Diagnosis not present

## 2023-02-19 DIAGNOSIS — C250 Malignant neoplasm of head of pancreas: Secondary | ICD-10-CM | POA: Diagnosis not present

## 2023-02-19 DIAGNOSIS — D701 Agranulocytosis secondary to cancer chemotherapy: Secondary | ICD-10-CM | POA: Diagnosis not present

## 2023-02-19 DIAGNOSIS — I10 Essential (primary) hypertension: Secondary | ICD-10-CM | POA: Diagnosis not present

## 2023-02-19 DIAGNOSIS — Z5189 Encounter for other specified aftercare: Secondary | ICD-10-CM | POA: Diagnosis not present

## 2023-02-19 DIAGNOSIS — Z452 Encounter for adjustment and management of vascular access device: Secondary | ICD-10-CM | POA: Diagnosis not present

## 2023-02-19 DIAGNOSIS — Q2381 Bicuspid aortic valve: Secondary | ICD-10-CM | POA: Diagnosis not present

## 2023-02-19 MED ORDER — SODIUM CHLORIDE 0.9 % IV SOLN
2400.0000 mg/m2 | INTRAVENOUS | Status: DC
  Administered 2023-02-19: 4350 mg via INTRAVENOUS
  Filled 2023-02-19: qty 87

## 2023-02-19 MED ORDER — SODIUM CHLORIDE 0.9% FLUSH
10.0000 mL | INTRAVENOUS | Status: DC | PRN
  Administered 2023-02-19: 10 mL

## 2023-02-19 MED ORDER — DEXTROSE 5 % IV SOLN
85.0000 mg/m2 | Freq: Once | INTRAVENOUS | Status: AC
  Administered 2023-02-19: 150 mg via INTRAVENOUS
  Filled 2023-02-19: qty 20

## 2023-02-19 MED ORDER — DEXTROSE 5 % IV SOLN
INTRAVENOUS | Status: DC
Start: 2023-02-19 — End: 2023-02-19

## 2023-02-19 MED ORDER — ATROPINE SULFATE 1 MG/ML IV SOLN
0.5000 mg | Freq: Once | INTRAVENOUS | Status: AC | PRN
  Administered 2023-02-19: 0.5 mg via INTRAVENOUS
  Filled 2023-02-19: qty 1

## 2023-02-19 MED ORDER — SODIUM CHLORIDE 0.9 % IV SOLN
150.0000 mg | Freq: Once | INTRAVENOUS | Status: AC
  Administered 2023-02-19: 150 mg via INTRAVENOUS
  Filled 2023-02-19: qty 150

## 2023-02-19 MED ORDER — SODIUM CHLORIDE 0.9 % IV SOLN
150.0000 mg/m2 | Freq: Once | INTRAVENOUS | Status: AC
  Administered 2023-02-19: 300 mg via INTRAVENOUS
  Filled 2023-02-19: qty 15

## 2023-02-19 MED ORDER — SODIUM CHLORIDE 0.9 % IV SOLN
400.0000 mg/m2 | Freq: Once | INTRAVENOUS | Status: AC
  Administered 2023-02-19: 724 mg via INTRAVENOUS
  Filled 2023-02-19: qty 36.2

## 2023-02-19 MED ORDER — DEXAMETHASONE SODIUM PHOSPHATE 10 MG/ML IJ SOLN
10.0000 mg | Freq: Once | INTRAMUSCULAR | Status: AC
  Administered 2023-02-19: 10 mg via INTRAVENOUS
  Filled 2023-02-19: qty 1

## 2023-02-19 MED ORDER — PALONOSETRON HCL INJECTION 0.25 MG/5ML
0.2500 mg | Freq: Once | INTRAVENOUS | Status: AC
  Administered 2023-02-19: 0.25 mg via INTRAVENOUS
  Filled 2023-02-19: qty 5

## 2023-02-19 NOTE — Patient Instructions (Signed)
[facility] - A DEPT OF [facility]  Discharge Instructions: Thank you for choosing [facility] to provide your oncology and hematology care.   If you have a lab appointment with the Cancer Center, please go directly to the Cancer Center and check in at the registration area.   Wear comfortable clothing and clothing appropriate for easy access to any Portacath or PICC line.   We strive to give you quality time with your provider. You may need to reschedule your appointment if you arrive late (15 or more minutes).  Arriving late affects you and other patients whose appointments are after yours.  Also, if you miss three or more appointments without notifying the office, you may be dismissed from the clinic at the provider's discretion.      For prescription refill requests, have your pharmacy contact our office and allow 72 hours for refills to be completed.    Today you received the following chemotherapy and/or immunotherapy agents: Oxaliplatin, Leucovorin, Irinotecan, Fluorouracil      To help prevent nausea and vomiting after your treatment, we encourage you to take your nausea medication as directed.  BELOW ARE SYMPTOMS THAT SHOULD BE REPORTED IMMEDIATELY: *FEVER GREATER THAN 100.4 F (38 C) OR HIGHER *CHILLS OR SWEATING *NAUSEA AND VOMITING THAT IS NOT CONTROLLED WITH YOUR NAUSEA MEDICATION *UNUSUAL SHORTNESS OF BREATH *UNUSUAL BRUISING OR BLEEDING *URINARY PROBLEMS (pain or burning when urinating, or frequent urination) *BOWEL PROBLEMS (unusual diarrhea, constipation, pain near the anus) TENDERNESS IN MOUTH AND THROAT WITH OR WITHOUT PRESENCE OF ULCERS (sore throat, sores in mouth, or a toothache) UNUSUAL RASH, SWELLING OR PAIN  UNUSUAL VAGINAL DISCHARGE OR ITCHING   Items with * indicate a potential emergency and should be followed up as soon as possible or go to the Emergency Department if any problems should occur.  Please show the  CHEMOTHERAPY ALERT CARD or IMMUNOTHERAPY ALERT CARD at check-in to the Emergency Department and triage nurse.  Should you have questions after your visit or need to cancel or reschedule your appointment, please contact [facility] - A DEPT OF [facility]  Dept: [phone]  and follow the prompts.  Office hours are 8:00 a.m. to 4:30 p.m. Monday - Friday. Please note that voicemails left after 4:00 p.m. may not be returned until the following business day.  We are closed weekends and major holidays. You have access to a nurse at all times for urgent questions. Please call the main number to the clinic Dept: [phone] and follow the prompts.   For any non-urgent questions, you may also contact your provider using MyChart. We now offer e-Visits for anyone 61 and older to request care online for non-urgent symptoms. For details visit [email or web address].   Also download the MyChart app! Go to the app store, search "MyChart", open the app, select [place], and log in with your MyChart username and password.  Oxaliplatin Injection What is this medication? OXALIPLATIN (ox AL i PLA tin) treats colorectal cancer. It works by slowing down the growth of cancer cells. This medicine may be used for other purposes; ask your health care provider or pharmacist if you have questions. COMMON BRAND NAME(S): Eloxatin What should I tell my care team before I take this medication? They need to know if you have any of these conditions: Heart disease History of irregular heartbeat or rhythm Liver disease Low blood cell levels (white cells, red cells, and platelets) Lung or  breathing disease, such as asthma Take medications that treat or prevent blood clots Tingling of the fingers, toes, or other nerve disorder An unusual or allergic reaction to oxaliplatin, other medications, foods, dyes, or preservatives If you or your partner are pregnant or trying to get  pregnant Breast-feeding How should I use this medication? This medication is injected into a vein. It is given by your care team in a hospital or clinic setting. Talk to your care team about the use of this medication in children. Special care may be needed. Overdosage: If you think you have taken too much of this medicine contact a poison control center or emergency room at once. NOTE: This medicine is only for you. Do not share this medicine with others. What if I miss a dose? Keep appointments for follow-up doses. It is important not to miss a dose. Call your care team if you are unable to keep an appointment. What may interact with this medication? Do not take this medication with any of the following: Cisapride Dronedarone Pimozide Thioridazine This medication may also interact with the following: Aspirin and aspirin-like medications Certain medications that treat or prevent blood clots, such as warfarin, apixaban, dabigatran, and rivaroxaban Cisplatin Cyclosporine Diuretics Medications for infection, such as acyclovir, adefovir, amphotericin B, bacitracin, cidofovir, foscarnet, ganciclovir, gentamicin, pentamidine, vancomycin NSAIDs, medications for pain and inflammation, such as ibuprofen or naproxen Other medications that cause heart rhythm changes Pamidronate Zoledronic acid This list may not describe all possible interactions. Give your health care provider a list of all the medicines, herbs, non-prescription drugs, or dietary supplements you use. Also tell them if you smoke, drink alcohol, or use illegal drugs. Some items may interact with your medicine. What should I watch for while using this medication? Your condition will be monitored carefully while you are receiving this medication. You may need blood work while taking this medication. This medication may make you feel generally unwell. This is not uncommon as chemotherapy can affect healthy cells as well as cancer  cells. Report any side effects. Continue your course of treatment even though you feel ill unless your care team tells you to stop. This medication may increase your risk of getting an infection. Call your care team for advice if you get a fever, chills, sore throat, or other symptoms of a cold or flu. Do not treat yourself. Try to avoid being around people who are sick. Avoid taking medications that contain aspirin, acetaminophen, ibuprofen, naproxen, or ketoprofen unless instructed by your care team. These medications may hide a fever. Be careful brushing or flossing your teeth or using a toothpick because you may get an infection or bleed more easily. If you have any dental work done, tell your dentist you are receiving this medication. This medication can make you more sensitive to cold. Do not drink cold drinks or use ice. Cover exposed skin before coming in contact with cold temperatures or cold objects. When out in cold weather wear warm clothing and cover your mouth and nose to warm the air that goes into your lungs. Tell your care team if you get sensitive to the cold. Talk to your care team if you or your partner are pregnant or think either of you might be pregnant. This medication can cause serious birth defects if taken during pregnancy and for 9 months after the last dose. A negative pregnancy test is required before starting this medication. A reliable form of contraception is recommended while taking this medication and for 9  months after the last dose. Talk to your care team about effective forms of contraception. Do not father a child while taking this medication and for 6 months after the last dose. Use a condom while having sex during this time period. Do not breastfeed while taking this medication and for 3 months after the last dose. This medication may cause infertility. Talk to your care team if you are concerned about your fertility. What side effects may I notice from receiving this  medication? Side effects that you should report to your care team as soon as possible: Allergic reactions--skin rash, itching, hives, swelling of the face, lips, tongue, or throat Bleeding--bloody or black, tar-like stools, vomiting blood or brown material that looks like coffee grounds, red or dark brown urine, small red or purple spots on skin, unusual bruising or bleeding Dry cough, shortness of breath or trouble breathing Heart rhythm changes--fast or irregular heartbeat, dizziness, feeling faint or lightheaded, chest pain, trouble breathing Infection--fever, chills, cough, sore throat, wounds that don't heal, pain or trouble when passing urine, general feeling of discomfort or being unwell Liver injury--right upper belly pain, loss of appetite, nausea, light-colored stool, dark yellow or brown urine, yellowing skin or eyes, unusual weakness or fatigue Low red blood cell level--unusual weakness or fatigue, dizziness, headache, trouble breathing Muscle injury--unusual weakness or fatigue, muscle pain, dark yellow or brown urine, decrease in amount of urine Pain, tingling, or numbness in the hands or feet Sudden and severe headache, confusion, change in vision, seizures, which may be signs of posterior reversible encephalopathy syndrome (PRES) Unusual bruising or bleeding Side effects that usually do not require medical attention (report to your care team if they continue or are bothersome): Diarrhea Nausea Pain, redness, or swelling with sores inside the mouth or throat Unusual weakness or fatigue Vomiting This list may not describe all possible side effects. Call your doctor for medical advice about side effects. You may report side effects to FDA at 1-800-FDA-1088. Where should I keep my medication? This medication is given in a hospital or clinic. It will not be stored at home. NOTE: This sheet is a summary. It may not cover all possible information. If you have questions about this  medicine, talk to your doctor, pharmacist, or health care provider.  2024 Elsevier/Gold Standard (2022-01-15 00:00:00)  Irinotecan Injection What is this medication? IRINOTECAN (ir in oh TEE kan) treats some types of cancer. It works by slowing down the growth of cancer cells. This medicine may be used for other purposes; ask your health care provider or pharmacist if you have questions. COMMON BRAND NAME(S): Camptosar What should I tell my care team before I take this medication? They need to know if you have any of these conditions: Dehydration Diarrhea Infection, especially a viral infection, such as chickenpox, cold sores, herpes Liver disease Low blood cell levels (white cells, red cells, and platelets) Low levels of electrolytes, such as calcium, magnesium, or potassium in your blood Recent or ongoing radiation An unusual or allergic reaction to irinotecan, other medications, foods, dyes, or preservatives If you or your partner are pregnant or trying to get pregnant Breast-feeding How should I use this medication? This medication is injected into a vein. It is given by your care team in a hospital or clinic setting. Talk to your care team about the use of this medication in children. Special care may be needed. Overdosage: If you think you have taken too much of this medicine contact a poison control center or  emergency room at once. NOTE: This medicine is only for you. Do not share this medicine with others. What if I miss a dose? Keep appointments for follow-up doses. It is important not to miss your dose. Call your care team if you are unable to keep an appointment. What may interact with this medication? Do not take this medication with any of the following: Cobicistat Itraconazole This medication may also interact with the following: Certain antibiotics, such as clarithromycin, rifampin, rifabutin Certain antivirals for HIV or AIDS Certain medications for fungal  infections, such as ketoconazole, posaconazole, voriconazole Certain medications for seizures, such as carbamazepine, phenobarbital, phenytoin Gemfibrozil Nefazodone St. John's wort This list may not describe all possible interactions. Give your health care provider a list of all the medicines, herbs, non-prescription drugs, or dietary supplements you use. Also tell them if you smoke, drink alcohol, or use illegal drugs. Some items may interact with your medicine. What should I watch for while using this medication? Your condition will be monitored carefully while you are receiving this medication. You may need blood work while taking this medication. This medication may make you feel generally unwell. This is not uncommon as chemotherapy can affect healthy cells as well as cancer cells. Report any side effects. Continue your course of treatment even though you feel ill unless your care team tells you to stop. This medication can cause serious side effects. To reduce the risk, your care team may give you other medications to take before receiving this one. Be sure to follow the directions from your care team. This medication may affect your coordination, reaction time, or judgement. Do not drive or operate machinery until you know how this medication affects you. Sit up or stand slowly to reduce the risk of dizzy or fainting spells. Drinking alcohol with this medication can increase the risk of these side effects. This medication may increase your risk of getting an infection. Call your care team for advice if you get a fever, chills, sore throat, or other symptoms of a cold or flu. Do not treat yourself. Try to avoid being around people who are sick. Avoid taking medications that contain aspirin, acetaminophen, ibuprofen, naproxen, or ketoprofen unless instructed by your care team. These medications may hide a fever. This medication may increase your risk to bruise or bleed. Call your care team if you  notice any unusual bleeding. Be careful brushing or flossing your teeth or using a toothpick because you may get an infection or bleed more easily. If you have any dental work done, tell your dentist you are receiving this medication. Talk to your care team if you or your partner are pregnant or think either of you might be pregnant. This medication can cause serious birth defects if taken during pregnancy and for 6 months after the last dose. You will need a negative pregnancy test before starting this medication. Contraception is recommended while taking this medication and for 6 months after the last dose. Your care team can help you find the option that works for you. Do not father a child while taking this medication and for 3 months after the last dose. Use a condom for contraception during this time period. Do not breastfeed while taking this medication and for 7 days after the last dose. This medication may cause infertility. Talk to your care team if you are concerned about your fertility. What side effects may I notice from receiving this medication? Side effects that you should report to your care  team as soon as possible: Allergic reactions--skin rash, itching, hives, swelling of the face, lips, tongue, or throat Dry cough, shortness of breath or trouble breathing Increased saliva or tears, increased sweating, stomach cramping, diarrhea, small pupils, unusual weakness or fatigue, slow heartbeat Infection--fever, chills, cough, sore throat, wounds that don't heal, pain or trouble when passing urine, general feeling of discomfort or being unwell Kidney injury--decrease in the amount of urine, swelling of the ankles, hands, or feet Low red blood cell level--unusual weakness or fatigue, dizziness, headache, trouble breathing Severe or prolonged diarrhea Unusual bruising or bleeding Side effects that usually do not require medical attention (report to your care team if they continue or are  bothersome): Constipation Diarrhea Hair loss Loss of appetite Nausea Stomach pain This list may not describe all possible side effects. Call your doctor for medical advice about side effects. You may report side effects to FDA at 1-800-FDA-1088. Where should I keep my medication? This medication is given in a hospital or clinic. It will not be stored at home. NOTE: This sheet is a summary. It may not cover all possible information. If you have questions about this medicine, talk to your doctor, pharmacist, or health care provider.  2024 Elsevier/Gold Standard (2021-08-13 00:00:00) Irinotecan Injection What is this medication? IRINOTECAN (ir in oh TEE kan) treats some types of cancer. It works by slowing down the growth of cancer cells. This medicine may be used for other purposes; ask your health care provider or pharmacist if you have questions. COMMON BRAND NAME(S): Camptosar What should I tell my care team before I take this medication? They need to know if you have any of these conditions: Dehydration Diarrhea Infection, especially a viral infection, such as chickenpox, cold sores, herpes Liver disease Low blood cell levels (white cells, red cells, and platelets) Low levels of electrolytes, such as calcium, magnesium, or potassium in your blood Recent or ongoing radiation An unusual or allergic reaction to irinotecan, other medications, foods, dyes, or preservatives If you or your partner are pregnant or trying to get pregnant Breast-feeding How should I use this medication? This medication is injected into a vein. It is given by your care team in a hospital or clinic setting. Talk to your care team about the use of this medication in children. Special care may be needed. Overdosage: If you think you have taken too much of this medicine contact a poison control center or emergency room at once. NOTE: This medicine is only for you. Do not share this medicine with others. What if  I miss a dose? Keep appointments for follow-up doses. It is important not to miss your dose. Call your care team if you are unable to keep an appointment. What may interact with this medication? Do not take this medication with any of the following: Cobicistat Itraconazole This medication may also interact with the following: Certain antibiotics, such as clarithromycin, rifampin, rifabutin Certain antivirals for HIV or AIDS Certain medications for fungal infections, such as ketoconazole, posaconazole, voriconazole Certain medications for seizures, such as carbamazepine, phenobarbital, phenytoin Gemfibrozil Nefazodone St. John's wort This list may not describe all possible interactions. Give your health care provider a list of all the medicines, herbs, non-prescription drugs, or dietary supplements you use. Also tell them if you smoke, drink alcohol, or use illegal drugs. Some items may interact with your medicine. What should I watch for while using this medication? Your condition will be monitored carefully while you are receiving this medication. You may need  blood work while taking this medication. This medication may make you feel generally unwell. This is not uncommon as chemotherapy can affect healthy cells as well as cancer cells. Report any side effects. Continue your course of treatment even though you feel ill unless your care team tells you to stop. This medication can cause serious side effects. To reduce the risk, your care team may give you other medications to take before receiving this one. Be sure to follow the directions from your care team. This medication may affect your coordination, reaction time, or judgement. Do not drive or operate machinery until you know how this medication affects you. Sit up or stand slowly to reduce the risk of dizzy or fainting spells. Drinking alcohol with this medication can increase the risk of these side effects. This medication may increase  your risk of getting an infection. Call your care team for advice if you get a fever, chills, sore throat, or other symptoms of a cold or flu. Do not treat yourself. Try to avoid being around people who are sick. Avoid taking medications that contain aspirin, acetaminophen, ibuprofen, naproxen, or ketoprofen unless instructed by your care team. These medications may hide a fever. This medication may increase your risk to bruise or bleed. Call your care team if you notice any unusual bleeding. Be careful brushing or flossing your teeth or using a toothpick because you may get an infection or bleed more easily. If you have any dental work done, tell your dentist you are receiving this medication. Talk to your care team if you or your partner are pregnant or think either of you might be pregnant. This medication can cause serious birth defects if taken during pregnancy and for 6 months after the last dose. You will need a negative pregnancy test before starting this medication. Contraception is recommended while taking this medication and for 6 months after the last dose. Your care team can help you find the option that works for you. Do not father a child while taking this medication and for 3 months after the last dose. Use a condom for contraception during this time period. Do not breastfeed while taking this medication and for 7 days after the last dose. This medication may cause infertility. Talk to your care team if you are concerned about your fertility. What side effects may I notice from receiving this medication? Side effects that you should report to your care team as soon as possible: Allergic reactions--skin rash, itching, hives, swelling of the face, lips, tongue, or throat Dry cough, shortness of breath or trouble breathing Increased saliva or tears, increased sweating, stomach cramping, diarrhea, small pupils, unusual weakness or fatigue, slow heartbeat Infection--fever, chills, cough, sore  throat, wounds that don't heal, pain or trouble when passing urine, general feeling of discomfort or being unwell Kidney injury--decrease in the amount of urine, swelling of the ankles, hands, or feet Low red blood cell level--unusual weakness or fatigue, dizziness, headache, trouble breathing Severe or prolonged diarrhea Unusual bruising or bleeding Side effects that usually do not require medical attention (report to your care team if they continue or are bothersome): Constipation Diarrhea Hair loss Loss of appetite Nausea Stomach pain This list may not describe all possible side effects. Call your doctor for medical advice about side effects. You may report side effects to FDA at 1-800-FDA-1088. Where should I keep my medication? This medication is given in a hospital or clinic. It will not be stored at home. NOTE: This sheet is  a summary. It may not cover all possible information. If you have questions about this medicine, talk to your doctor, pharmacist, or health care provider.  2024 Elsevier/Gold Standard (2021-08-13 00:00:00)  Fluorouracil Injection What is this medication? FLUOROURACIL (flure oh YOOR a sil) treats some types of cancer. It works by slowing down the growth of cancer cells. This medicine may be used for other purposes; ask your health care provider or pharmacist if you have questions. COMMON BRAND NAME(S): Adrucil What should I tell my care team before I take this medication? They need to know if you have any of these conditions: Blood disorders Dihydropyrimidine dehydrogenase (DPD) deficiency Infection, such as chickenpox, cold sores, herpes Kidney disease Liver disease Poor nutrition Recent or ongoing radiation therapy An unusual or allergic reaction to fluorouracil, other medications, foods, dyes, or preservatives If you or your partner are pregnant or trying to get pregnant Breast-feeding How should I use this medication? This medication is injected  into a vein. It is administered by your care team in a hospital or clinic setting. Talk to your care team about the use of this medication in children. Special care may be needed. Overdosage: If you think you have taken too much of this medicine contact a poison control center or emergency room at once. NOTE: This medicine is only for you. Do not share this medicine with others. What if I miss a dose? Keep appointments for follow-up doses. It is important not to miss your dose. Call your care team if you are unable to keep an appointment. What may interact with this medication? Do not take this medication with any of the following: Live virus vaccines This medication may also interact with the following: Medications that treat or prevent blood clots, such as warfarin, enoxaparin, dalteparin This list may not describe all possible interactions. Give your health care provider a list of all the medicines, herbs, non-prescription drugs, or dietary supplements you use. Also tell them if you smoke, drink alcohol, or use illegal drugs. Some items may interact with your medicine. What should I watch for while using this medication? Your condition will be monitored carefully while you are receiving this medication. This medication may make you feel generally unwell. This is not uncommon as chemotherapy can affect healthy cells as well as cancer cells. Report any side effects. Continue your course of treatment even though you feel ill unless your care team tells you to stop. In some cases, you may be given additional medications to help with side effects. Follow all directions for their use. This medication may increase your risk of getting an infection. Call your care team for advice if you get a fever, chills, sore throat, or other symptoms of a cold or flu. Do not treat yourself. Try to avoid being around people who are sick. This medication may increase your risk to bruise or bleed. Call your care team if  you notice any unusual bleeding. Be careful brushing or flossing your teeth or using a toothpick because you may get an infection or bleed more easily. If you have any dental work done, tell your dentist you are receiving this medication. Avoid taking medications that contain aspirin, acetaminophen, ibuprofen, naproxen, or ketoprofen unless instructed by your care team. These medications may hide a fever. Do not treat diarrhea with over the counter products. Contact your care team if you have diarrhea that lasts more than 2 days or if it is severe and watery. This medication can make you more sensitive  to the sun. Keep out of the sun. If you cannot avoid being in the sun, wear protective clothing and sunscreen. Do not use sun lamps, tanning beds, or tanning booths. Talk to your care team if you or your partner wish to become pregnant or think you might be pregnant. This medication can cause serious birth defects if taken during pregnancy and for 3 months after the last dose. A reliable form of contraception is recommended while taking this medication and for 3 months after the last dose. Talk to your care team about effective forms of contraception. Do not father a child while taking this medication and for 3 months after the last dose. Use a condom while having sex during this time period. Do not breastfeed while taking this medication. This medication may cause infertility. Talk to your care team if you are concerned about your fertility. What side effects may I notice from receiving this medication? Side effects that you should report to your care team as soon as possible: Allergic reactions--skin rash, itching, hives, swelling of the face, lips, tongue, or throat Heart attack--pain or tightness in the chest, shoulders, arms, or jaw, nausea, shortness of breath, cold or clammy skin, feeling faint or lightheaded Heart failure--shortness of breath, swelling of the ankles, feet, or hands, sudden weight  gain, unusual weakness or fatigue Heart rhythm changes--fast or irregular heartbeat, dizziness, feeling faint or lightheaded, chest pain, trouble breathing High ammonia level--unusual weakness or fatigue, confusion, loss of appetite, nausea, vomiting, seizures Infection--fever, chills, cough, sore throat, wounds that don't heal, pain or trouble when passing urine, general feeling of discomfort or being unwell Low red blood cell level--unusual weakness or fatigue, dizziness, headache, trouble breathing Pain, tingling, or numbness in the hands or feet, muscle weakness, change in vision, confusion or trouble speaking, loss of balance or coordination, trouble walking, seizures Redness, swelling, and blistering of the skin over hands and feet Severe or prolonged diarrhea Unusual bruising or bleeding Side effects that usually do not require medical attention (report to your care team if they continue or are bothersome): Dry skin Headache Increased tears Nausea Pain, redness, or swelling with sores inside the mouth or throat Sensitivity to light Vomiting This list may not describe all possible side effects. Call your doctor for medical advice about side effects. You may report side effects to FDA at 1-800-FDA-1088. Where should I keep my medication? This medication is given in a hospital or clinic. It will not be stored at home. NOTE: This sheet is a summary. It may not cover all possible information. If you have questions about this medicine, talk to your doctor, pharmacist, or health care provider.  2024 Elsevier/Gold Standard (2021-08-07 00:00:00)

## 2023-02-19 NOTE — Progress Notes (Signed)
Patient with complaints of stomach cramping and sweatiness.  Irinotecan paused, line flushed with Normal saline. Atropine administered (see MAR). Irinotecan restarted.  Patient reported resolution of symptoms within 10 minutes of administration of atropine.

## 2023-02-19 NOTE — Telephone Encounter (Signed)
Notified Patient of prior authorization approval for Ondansetron 8 mg Tablets. Medication is approved through 02/18/2024. No other needs or concerns voiced at this time.

## 2023-02-20 ENCOUNTER — Telehealth: Payer: Self-pay

## 2023-02-20 NOTE — Telephone Encounter (Signed)
Called patient for first time chemo follow-up.  Left voicemail instructing patient to contact office with any questions or concerns.

## 2023-02-21 ENCOUNTER — Inpatient Hospital Stay: Payer: BC Managed Care – PPO

## 2023-02-21 VITALS — BP 142/89 | HR 66 | Temp 97.7°F | Resp 18

## 2023-02-21 DIAGNOSIS — Z5111 Encounter for antineoplastic chemotherapy: Secondary | ICD-10-CM | POA: Diagnosis not present

## 2023-02-21 DIAGNOSIS — Q2381 Bicuspid aortic valve: Secondary | ICD-10-CM | POA: Diagnosis not present

## 2023-02-21 DIAGNOSIS — C250 Malignant neoplasm of head of pancreas: Secondary | ICD-10-CM

## 2023-02-21 DIAGNOSIS — Z5189 Encounter for other specified aftercare: Secondary | ICD-10-CM | POA: Diagnosis not present

## 2023-02-21 DIAGNOSIS — I10 Essential (primary) hypertension: Secondary | ICD-10-CM | POA: Diagnosis not present

## 2023-02-21 DIAGNOSIS — R739 Hyperglycemia, unspecified: Secondary | ICD-10-CM | POA: Diagnosis not present

## 2023-02-21 DIAGNOSIS — D701 Agranulocytosis secondary to cancer chemotherapy: Secondary | ICD-10-CM | POA: Diagnosis not present

## 2023-02-21 DIAGNOSIS — Z452 Encounter for adjustment and management of vascular access device: Secondary | ICD-10-CM | POA: Diagnosis not present

## 2023-02-21 MED ORDER — SODIUM CHLORIDE 0.9% FLUSH
10.0000 mL | INTRAVENOUS | Status: DC | PRN
  Administered 2023-02-21: 10 mL

## 2023-02-21 MED ORDER — HEPARIN SOD (PORK) LOCK FLUSH 100 UNIT/ML IV SOLN
500.0000 [IU] | Freq: Once | INTRAVENOUS | Status: AC | PRN
Start: 2023-02-21 — End: 2023-02-21
  Administered 2023-02-21: 500 [IU]

## 2023-02-21 NOTE — Patient Instructions (Signed)

## 2023-02-25 ENCOUNTER — Telehealth: Payer: Self-pay | Admitting: Nurse Practitioner

## 2023-02-25 ENCOUNTER — Ambulatory Visit
Admission: RE | Admit: 2023-02-25 | Discharge: 2023-02-25 | Disposition: A | Discharge status: Home / Self Care | Payer: BC Managed Care – PPO | Source: Ambulatory Visit | Attending: Thoracic Surgery (Cardiothoracic Vascular Surgery) | Admitting: Thoracic Surgery (Cardiothoracic Vascular Surgery)

## 2023-02-25 ENCOUNTER — Encounter: Payer: Self-pay | Admitting: Thoracic Surgery (Cardiothoracic Vascular Surgery)

## 2023-02-25 ENCOUNTER — Ambulatory Visit (INDEPENDENT_AMBULATORY_CARE_PROVIDER_SITE_OTHER): Payer: BC Managed Care – PPO | Admitting: Thoracic Surgery (Cardiothoracic Vascular Surgery)

## 2023-02-25 VITALS — BP 135/89 | HR 76 | Resp 20 | Ht 68.0 in | Wt 147.0 lb

## 2023-02-25 DIAGNOSIS — I7121 Aneurysm of the ascending aorta, without rupture: Secondary | ICD-10-CM | POA: Diagnosis not present

## 2023-02-25 MED ORDER — IOPAMIDOL (ISOVUE-370) INJECTION 76%
75.0000 mL | Freq: Once | INTRAVENOUS | Status: AC | PRN
  Administered 2023-02-25: 75 mL via INTRAVENOUS

## 2023-02-25 NOTE — Progress Notes (Signed)
[address]       [place] [address]             [phone]     HPI: [name] returns for follow-up of his ascending aneurysm.  Randy Allen is a 62 year old man with a history of a bicuspid aortic valve, moderate aortic stenosis, and ascending aortic aneurysm, hypertension, hyperlipidemia, hypothyroidism, hiatal hernia with reflux and recently diagnosed pancreatic cancer.  He has been followed for an ascending aneurysm since 2021.  Recently diagnosed with pancreatic cancer.  Started with abdominal discomfort.  Workup was negative but then developed jaundice and MR showed a pancreatic mass with obstruction of his bile duct.  He is about to start neoadjuvant chemotherapy and then will have resection early next year.  Past Medical History:  Diagnosis Date   AAA (abdominal aortic aneurysm) without rupture [facility]) 2021   cardiothoracic surgeon--- dr s. Tranquilino Fischler/ cardiology--- dr Demetrius Charity. 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 > 10 yr.   Hypothyroidism    followed by pcp   Malignant neoplasm of head of pancreas [facility]) 01/2023   oncologist--- dr sherrill/  GI-- Dr Perry/  surgeon-- Dr Donell Beers;  dx jaundice 01-09-2023, obstructive 01-18-2023 s/p EGD/ biliary brushings/ ERCP sphincterotomy / stent placement;  malignant cells found   Moderate aortic valve stenosis 2021   cardiologist--- dr Lovina Reach;    bicuspid;  last echo in epic 06-21-2022 mild AR,  MG 21.5 mmHg, AVA 1.2 cm^2, ef 60-65%   Seasonal allergies    Severe protein-calorie malnutrition (HCC)    Vitamin D deficiency    Wears glasses     Current Outpatient Medications  Medication Sig Dispense Refill   atorvastatin (LIPITOR) 40 MG tablet Take  1 tablet  Daily  for Cholesterol 90 tablet 3   Blood Glucose Monitoring Suppl DEVI 1 each by Does not apply route in the morning, at noon, and at bedtime. May substitute to any manufacturer covered by patient's insurance. 1 each 0   enalapril (VASOTEC) 10 MG tablet take 1 tablet by mouth daily for blood pressure.  May take an extra half tablet daily for systolic blood pressure greater than 140 and diastolic blood pressure greater than 90. (Patient taking differently: Take 10 mg by mouth daily after lunch. take 1 tablet by mouth daily for blood pressure.  May take an extra half tablet daily for systolic blood pressure greater than 140 and diastolic blood pressure greater than 90.) 90 tablet 2   levothyroxine (SYNTHROID) 75 MCG tablet Take  1 tablet  Daily  on an empty stomach with only water for 30 minutes & no Antacid meds, Calcium or Magnesium for 4 hours & avoid Biotin (Patient taking differently: Take 50 mcg by mouth as directed. 1.5  tablets by mouth Monday Wednesday Friday. 1 tablet by mouth all other days) 90 tablet 3   lidocaine-prilocaine (EMLA) cream Apply 1 Application topically as needed. Apply 1 tablespoon to port site 1-2 hours prior to stick and cover with Press-and-Seal to numb site. MAY START 14 DAYS AFTER PORT IS PLACED 30 g 3   omeprazole (PRILOSEC) 40 MG capsule Take 1 capsule (40 mg total) by mouth 2 (two) times daily before a meal. Twice daily for 37-months then may go to once daily 60 capsule 6   ondansetron (ZOFRAN) 8 MG tablet Take 1 tablet (8 mg total) by mouth every 8 (eight) hours as needed. Start 72 hours after chemotherapy treatment date 30 tablet 1   oxyCODONE (OXY  IR/ROXICODONE) 5 MG immediate release tablet Take 1 tablet (5 mg total) by mouth every 6 (six) hours as needed for severe pain (pain score 7-10). 5 tablet 0   prochlorperazine (COMPAZINE) 10 MG tablet Take 1 tablet (10 mg total) by mouth every 6 (six) hours as needed for nausea. 60 tablet 1   traMADol (ULTRAM) 50 MG tablet Take 1 tablet (50 mg total) by mouth every 4 (four) hours as needed. 60 tablet 0   Vitamin D, Ergocalciferol, (DRISDOL) 1.25 MG (50000 UNIT) CAPS capsule Take 5,000 Units by mouth daily.     No current facility-administered medications for this visit.    Physical Exam BP 135/89 (BP Location: Left Arm, Patient Position: Sitting, Cuff Size: Normal)   Pulse 76   Resp 20   Ht 5\' 8"  (1.727 m)   Wt 147 lb (66.7 kg)   SpO2 97%   BMI 22.75 kg/m  62 year old man in no acute distress Alert and oriented x 3 with no focal deficits Well-developed and well-nourished Mild scleral icterus Lungs clear Cardiac regular rate and rhythm with a 2/6 to 3/6 systolic murmur No peripheral edema  Diagnostic Tests: I personally reviewed the CT images from today.  Has not yet been officially read.  No change in the 5.1 to 5.2 cm ascending aneurysm, no obvious pulmonary lesions.  Impression: Randy Allen is a 62 year old man with a history of a bicuspid aortic valve, moderate aortic stenosis, and ascending aortic aneurysm, hypertension, hyperlipidemia, hypothyroidism, hiatal hernia with reflux and recently diagnosed pancreatic cancer.  Ascending aneurysm-no significant change over past 6 months.  No indication for surgery at present time.  He is aware the importance of blood pressure control.  Hypertension-blood pressure slightly higher than ideal but still below 140 systolic.  Continue current medical regimen.  Aortic stenosis-moderate on his last echo.  Has a follow-up with Dr. Tenny Craw next year.  Likely will get another echo.  Pancreatic cancer-has had a port placed for neoadjuvant  chemotherapy.  Plan is to follow that with surgery.  From the standpoint of his aneurysm there is no contraindication to surgery for his pancreatic cancer.  There is some risk of aneurysm rupture or dissection in the perioperative setting, especially if there are swings in blood pressure.  Anesthesiology is well versed in managing those issues.  Furthermore, the procedures necessary to have a chance of survival.  Should proceed accordingly.  Plan: Return in 6 months with CT angiogram of chest  Loreli Slot, MD [facility] [phone]

## 2023-02-25 NOTE — Telephone Encounter (Signed)
Refill request on Lipitor and blood sugar test strips. Please send both to [facility] [id number] - [place], [place] - [address]

## 2023-02-26 ENCOUNTER — Other Ambulatory Visit: Payer: Self-pay

## 2023-02-26 ENCOUNTER — Encounter: Payer: Self-pay | Admitting: Nurse Practitioner

## 2023-02-26 ENCOUNTER — Other Ambulatory Visit: Payer: Self-pay | Admitting: Nurse Practitioner

## 2023-02-26 DIAGNOSIS — R7309 Other abnormal glucose: Secondary | ICD-10-CM

## 2023-02-26 DIAGNOSIS — E782 Mixed hyperlipidemia: Secondary | ICD-10-CM

## 2023-02-26 MED ORDER — ATORVASTATIN CALCIUM 40 MG PO TABS: ORAL_TABLET | ORAL | 3 refills | Status: DC

## 2023-02-26 MED ORDER — GLUCOSE BLOOD VI STRP: ORAL_STRIP | 12 refills | Status: DC

## 2023-02-28 ENCOUNTER — Telehealth: Payer: Self-pay | Admitting: *Deleted

## 2023-02-28 NOTE — Telephone Encounter (Signed)
Randy Allen left VM late this afternoon requesting script for oxycodone 5 mg. Informed MD not in the office now, but sent him a message that he may see over the weekend. He will call Monday if Dr. Truett Perna did not order medication

## 2023-03-01 ENCOUNTER — Other Ambulatory Visit: Payer: Self-pay | Admitting: Oncology

## 2023-03-01 MED ORDER — OXYCODONE HCL 5 MG PO TABS: 5.0000 mg | ORAL_TABLET | ORAL | 0 refills | Status: DC | PRN

## 2023-03-04 ENCOUNTER — Inpatient Hospital Stay: Payer: BC Managed Care – PPO

## 2023-03-04 ENCOUNTER — Inpatient Hospital Stay: Payer: BC Managed Care – PPO | Admitting: Nurse Practitioner

## 2023-03-04 ENCOUNTER — Telehealth: Payer: Self-pay

## 2023-03-04 ENCOUNTER — Encounter: Payer: Self-pay | Admitting: Nurse Practitioner

## 2023-03-04 VITALS — BP 113/79 | HR 66 | Temp 98.1°F | Resp 18 | Ht 68.0 in | Wt 146.6 lb

## 2023-03-04 DIAGNOSIS — D701 Agranulocytosis secondary to cancer chemotherapy: Secondary | ICD-10-CM | POA: Diagnosis not present

## 2023-03-04 DIAGNOSIS — C250 Malignant neoplasm of head of pancreas: Secondary | ICD-10-CM

## 2023-03-04 DIAGNOSIS — I10 Essential (primary) hypertension: Secondary | ICD-10-CM | POA: Diagnosis not present

## 2023-03-04 DIAGNOSIS — Z5111 Encounter for antineoplastic chemotherapy: Secondary | ICD-10-CM | POA: Diagnosis not present

## 2023-03-04 DIAGNOSIS — R739 Hyperglycemia, unspecified: Secondary | ICD-10-CM | POA: Diagnosis not present

## 2023-03-04 DIAGNOSIS — Z452 Encounter for adjustment and management of vascular access device: Secondary | ICD-10-CM | POA: Diagnosis not present

## 2023-03-04 DIAGNOSIS — Z5189 Encounter for other specified aftercare: Secondary | ICD-10-CM | POA: Diagnosis not present

## 2023-03-04 DIAGNOSIS — Q2381 Bicuspid aortic valve: Secondary | ICD-10-CM | POA: Diagnosis not present

## 2023-03-04 LAB — CBC WITH DIFFERENTIAL (CANCER CENTER ONLY)
Abs Immature Granulocytes: 0.01 10*3/uL (ref 0.00–0.07)
Basophils Absolute: 0 10*3/uL (ref 0.0–0.1)
Basophils Relative: 0 %
Eosinophils Absolute: 0.8 10*3/uL — ABNORMAL HIGH (ref 0.0–0.5)
Eosinophils Relative: 21 %
HCT: 36.6 % — ABNORMAL LOW (ref 39.0–52.0)
Hemoglobin: 12.7 g/dL — ABNORMAL LOW (ref 13.0–17.0)
Immature Granulocytes: 0 %
Lymphocytes Relative: 41 %
Lymphs Abs: 1.6 10*3/uL (ref 0.7–4.0)
MCH: 30.1 pg (ref 26.0–34.0)
MCHC: 34.7 g/dL (ref 30.0–36.0)
MCV: 86.7 fL (ref 80.0–100.0)
Monocytes Absolute: 0.5 10*3/uL (ref 0.1–1.0)
Monocytes Relative: 14 %
Neutro Abs: 1 10*3/uL — ABNORMAL LOW (ref 1.7–7.7)
Neutrophils Relative %: 24 %
Platelet Count: 195 10*3/uL (ref 150–400)
RBC: 4.22 MIL/uL (ref 4.22–5.81)
RDW: 13.1 % (ref 11.5–15.5)
WBC Count: 4 10*3/uL (ref 4.0–10.5)
nRBC: 0 % (ref 0.0–0.2)

## 2023-03-04 LAB — CMP (CANCER CENTER ONLY)
ALT: 19 U/L (ref 0–44)
AST: 19 U/L (ref 15–41)
Albumin: 3.7 g/dL (ref 3.5–5.0)
Alkaline Phosphatase: 75 U/L (ref 38–126)
Anion gap: 6 (ref 5–15)
BUN: 14 mg/dL (ref 8–23)
CO2: 27 mmol/L (ref 22–32)
Calcium: 9.5 mg/dL (ref 8.9–10.3)
Chloride: 104 mmol/L (ref 98–111)
Creatinine: 0.91 mg/dL (ref 0.61–1.24)
GFR, Estimated: >60 mL/min (ref >60)
Glucose, Bld: 89 mg/dL (ref 70–99)
Potassium: 4 mmol/L (ref 3.5–5.1)
Sodium: 137 mmol/L (ref 135–145)
Total Bilirubin: 0.6 mg/dL (ref <1.2)
Total Protein: 6.1 g/dL — ABNORMAL LOW (ref 6.5–8.1)

## 2023-03-04 NOTE — Progress Notes (Signed)
  [facility] OFFICE PROGRESS NOTE   Diagnosis: Pancreas cancer  INTERVAL HISTORY:   Randy Allen returns as scheduled.  He completed cycle 1 FOLFIRINOX 02/19/2023.  He had maybe some mild nausea.  No vomiting.  No mouth sores.  He developed diarrhea around day 6.  The diarrhea lasted 4 days.  At the most he estimates 6 bowel movements within a 24-hour period.  He took Imodium but not as per our directions.  He had jaw pain for a few days.  He did not experience significant cold sensitivity.  No numbness or tingling in the hands or feet today.  Objective:  Vital signs in last 24 hours:  Pulse 66, temperature 98.1 F (36.7 C), temperature source Temporal, resp. rate 18, height 5\' 8"  (1.727 m), weight 146 lb 9.6 oz (66.5 kg), SpO2 100%.    HEENT: No thrush or ulcers. Resp: Lungs clear bilaterally. Cardio: Regular rate and rhythm. GI: No hepatosplenomegaly. Vascular: No leg edema. Port-A-Cath without erythema.  Lab Results:  Lab Results  Component Value Date   WBC 4.0 03/04/2023   HGB 12.7 (L) 03/04/2023   HCT 36.6 (L) 03/04/2023   MCV 86.7 03/04/2023   PLT 195 03/04/2023   NEUTROABS 1.0 (L) 03/04/2023    Imaging:  No results found.  Medications: I have reviewed the patient's current medications.  Assessment/Plan: Pancreas cancer Elevated liver enzymes/bilirubin September 2024 CT abdomen/pelvis 01/10/2023-no focal liver abnormality, no biliary dilation, pancreas unremarkable MRI/MRCP 01/15/2023-mild intra and extrahepatic biliary duct dilation, common duct is abruptly truncated in the superior pancreas head, hypoenhancing mass in the central pancreas head measuring 2.6 x 2.4 cm, remaining pancreas is atrophic with dilation of the main pancreatic duct, mass contacts the adjacent central SMV and portal confluence with less than 180 degrees of contact, preserved fat plane to the SMA with no enlarged abdominal lymph nodes, no ascites ERCP 01/18/2023-single stenosis in  the lower third of the main bile duct, brushings obtained, plastic stent placed-adenocarcinoma EUS 02/06/2023-30 x 30 mm pancreas head mass with irregular outer margins, abutment of the portal vein and SMV with intact fat planes, intact interface between the mass and SMA and celiac trunk, common bile duct stent, no malignant appearing lymph nodes, T2 N0 Cycle 1 FOLFIRINOX 02/19/2023 Chemotherapy held 03/04/2023 due to neutropenia Abdomen/back pain secondary to #1 Bicuspid aortic valve Hypertension Antral gastritis and ulcers on EUS 02/06/2023-Prilosec Hyperglycemia    Disposition: Randy Allen appears stable.  He has completed 1 cycle of FOLFIRINOX.  Cycle 2 will be held due to neutropenia.  We discussed adding white cell growth factor support on the day of pump discontinuation.  We reviewed potential side effects including bone pain, rash, splenic rupture.  He agrees with this plan.  He understands to contact the office with fever, chills, other signs of infection.  He will return for cycle 2 FOLFIRINOX 03/10/2023.  We will see him in follow-up prior to cycle 3 on 03/24/2023.    Lonna Cobb ANP/GNP-BC   03/04/2023  12:40 PM

## 2023-03-04 NOTE — Telephone Encounter (Signed)
I contacted the patient to inform him that he has been approved for his fulphila treatment.

## 2023-03-05 ENCOUNTER — Other Ambulatory Visit: Payer: Self-pay

## 2023-03-05 ENCOUNTER — Encounter: Payer: Self-pay | Admitting: Oncology

## 2023-03-05 ENCOUNTER — Inpatient Hospital Stay: Payer: BC Managed Care – PPO | Admitting: Nutrition

## 2023-03-05 ENCOUNTER — Inpatient Hospital Stay: Payer: BC Managed Care – PPO

## 2023-03-05 DIAGNOSIS — K831 Obstruction of bile duct: Secondary | ICD-10-CM

## 2023-03-05 DIAGNOSIS — C250 Malignant neoplasm of head of pancreas: Secondary | ICD-10-CM

## 2023-03-05 NOTE — Telephone Encounter (Signed)
Telephone call  

## 2023-03-07 ENCOUNTER — Other Ambulatory Visit: Payer: Self-pay | Admitting: *Deleted

## 2023-03-07 ENCOUNTER — Inpatient Hospital Stay: Payer: BC Managed Care – PPO

## 2023-03-07 DIAGNOSIS — C250 Malignant neoplasm of head of pancreas: Secondary | ICD-10-CM

## 2023-03-07 NOTE — Progress Notes (Signed)
Referral to Genetics entered

## 2023-03-10 ENCOUNTER — Inpatient Hospital Stay: Payer: BC Managed Care – PPO

## 2023-03-10 VITALS — BP 126/80 | HR 68 | Temp 97.6°F | Resp 18 | Ht 68.0 in | Wt 146.1 lb

## 2023-03-10 DIAGNOSIS — C250 Malignant neoplasm of head of pancreas: Secondary | ICD-10-CM | POA: Diagnosis not present

## 2023-03-10 DIAGNOSIS — Z95828 Presence of other vascular implants and grafts: Secondary | ICD-10-CM

## 2023-03-10 DIAGNOSIS — Z5189 Encounter for other specified aftercare: Secondary | ICD-10-CM | POA: Diagnosis not present

## 2023-03-10 DIAGNOSIS — R739 Hyperglycemia, unspecified: Secondary | ICD-10-CM | POA: Diagnosis not present

## 2023-03-10 DIAGNOSIS — Z5111 Encounter for antineoplastic chemotherapy: Secondary | ICD-10-CM | POA: Diagnosis not present

## 2023-03-10 DIAGNOSIS — D701 Agranulocytosis secondary to cancer chemotherapy: Secondary | ICD-10-CM | POA: Diagnosis not present

## 2023-03-10 DIAGNOSIS — Z452 Encounter for adjustment and management of vascular access device: Secondary | ICD-10-CM | POA: Diagnosis not present

## 2023-03-10 DIAGNOSIS — Q2381 Bicuspid aortic valve: Secondary | ICD-10-CM | POA: Diagnosis not present

## 2023-03-10 DIAGNOSIS — I10 Essential (primary) hypertension: Secondary | ICD-10-CM | POA: Diagnosis not present

## 2023-03-10 LAB — CBC WITH DIFFERENTIAL (CANCER CENTER ONLY)
Abs Immature Granulocytes: 0.07 10*3/uL (ref 0.00–0.07)
Basophils Absolute: 0 10*3/uL (ref 0.0–0.1)
Basophils Relative: 1 %
Eosinophils Absolute: 0.2 10*3/uL (ref 0.0–0.5)
Eosinophils Relative: 4 %
HCT: 36.7 % — ABNORMAL LOW (ref 39.0–52.0)
Hemoglobin: 12.5 g/dL — ABNORMAL LOW (ref 13.0–17.0)
Immature Granulocytes: 2 %
Lymphocytes Relative: 33 %
Lymphs Abs: 1.6 10*3/uL (ref 0.7–4.0)
MCH: 29.8 pg (ref 26.0–34.0)
MCHC: 34.1 g/dL (ref 30.0–36.0)
MCV: 87.6 fL (ref 80.0–100.0)
Monocytes Absolute: 0.7 10*3/uL (ref 0.1–1.0)
Monocytes Relative: 15 %
Neutro Abs: 2.2 10*3/uL (ref 1.7–7.7)
Neutrophils Relative %: 45 %
Platelet Count: 222 10*3/uL (ref 150–400)
RBC: 4.19 MIL/uL — ABNORMAL LOW (ref 4.22–5.81)
RDW: 13.4 % (ref 11.5–15.5)
WBC Count: 4.8 10*3/uL (ref 4.0–10.5)
nRBC: 0 % (ref 0.0–0.2)

## 2023-03-10 LAB — CMP (CANCER CENTER ONLY)
ALT: 30 U/L (ref 0–44)
AST: 25 U/L (ref 15–41)
Albumin: 3.6 g/dL (ref 3.5–5.0)
Alkaline Phosphatase: 112 U/L (ref 38–126)
Anion gap: 9 (ref 5–15)
BUN: 18 mg/dL (ref 8–23)
CO2: 27 mmol/L (ref 22–32)
Calcium: 9.3 mg/dL (ref 8.9–10.3)
Chloride: 103 mmol/L (ref 98–111)
Creatinine: 0.79 mg/dL (ref 0.61–1.24)
GFR, Estimated: >60 mL/min (ref >60)
Glucose, Bld: 99 mg/dL (ref 70–99)
Potassium: 4.1 mmol/L (ref 3.5–5.1)
Sodium: 139 mmol/L (ref 135–145)
Total Bilirubin: 0.6 mg/dL (ref <1.2)
Total Protein: 6.5 g/dL (ref 6.5–8.1)

## 2023-03-10 MED ORDER — OXALIPLATIN CHEMO INJECTION 100 MG/20ML
85.0000 mg/m2 | Freq: Once | INTRAVENOUS | Status: AC
  Administered 2023-03-10: 150 mg via INTRAVENOUS
  Filled 2023-03-10: qty 20

## 2023-03-10 MED ORDER — SODIUM CHLORIDE 0.9 % IV SOLN
150.0000 mg | Freq: Once | INTRAVENOUS | Status: AC
  Administered 2023-03-10: 150 mg via INTRAVENOUS
  Filled 2023-03-10: qty 150

## 2023-03-10 MED ORDER — ATROPINE SULFATE 1 MG/ML IV SOLN
0.5000 mg | Freq: Once | INTRAVENOUS | Status: AC | PRN
  Administered 2023-03-10: 0.5 mg via INTRAVENOUS
  Filled 2023-03-10: qty 1

## 2023-03-10 MED ORDER — PALONOSETRON HCL INJECTION 0.25 MG/5ML
0.2500 mg | Freq: Once | INTRAVENOUS | Status: AC
  Administered 2023-03-10: 0.25 mg via INTRAVENOUS
  Filled 2023-03-10: qty 5

## 2023-03-10 MED ORDER — SODIUM CHLORIDE 0.9% FLUSH
10.0000 mL | INTRAVENOUS | Status: DC | PRN
  Administered 2023-03-10: 10 mL via INTRAVENOUS

## 2023-03-10 MED ORDER — DEXTROSE 5 % IV SOLN: INTRAVENOUS | Status: DC

## 2023-03-10 MED ORDER — LEUCOVORIN CALCIUM INJECTION 350 MG
400.0000 mg/m2 | Freq: Once | INTRAMUSCULAR | Status: DC
  Filled 2023-03-10: qty 36.2

## 2023-03-10 MED ORDER — SODIUM CHLORIDE 0.9 % IV SOLN
400.0000 mg/m2 | Freq: Once | INTRAVENOUS | Status: AC
  Administered 2023-03-10: 724 mg via INTRAVENOUS
  Filled 2023-03-10: qty 36.2

## 2023-03-10 MED ORDER — SODIUM CHLORIDE 0.9 % IV SOLN
2400.0000 mg/m2 | INTRAVENOUS | Status: DC
  Administered 2023-03-10: 4350 mg via INTRAVENOUS
  Filled 2023-03-10: qty 87

## 2023-03-10 MED ORDER — DEXAMETHASONE SODIUM PHOSPHATE 10 MG/ML IJ SOLN
10.0000 mg | Freq: Once | INTRAMUSCULAR | Status: AC
  Administered 2023-03-10: 10 mg via INTRAVENOUS
  Filled 2023-03-10: qty 1

## 2023-03-10 MED ORDER — SODIUM CHLORIDE 0.9 % IV SOLN
150.0000 mg/m2 | Freq: Once | INTRAVENOUS | Status: AC
  Administered 2023-03-10: 300 mg via INTRAVENOUS
  Filled 2023-03-10: qty 15

## 2023-03-10 NOTE — Patient Instructions (Signed)
[facility] - A DEPT OF [facility]   Discharge Instructions: Thank you for choosing [facility] to provide your oncology and hematology care.   If you have a lab appointment with the Cancer Center, please go directly to the Cancer Center and check in at the registration area.   Wear comfortable clothing and clothing appropriate for easy access to any Portacath or PICC line.   We strive to give you quality time with your provider. You may need to reschedule your appointment if you arrive late (15 or more minutes).  Arriving late affects you and other patients whose appointments are after yours.  Also, if you miss three or more appointments without notifying the office, you may be dismissed from the clinic at the provider's discretion.      For prescription refill requests, have your pharmacy contact our office and allow 72 hours for refills to be completed.    Today you received the following chemotherapy and/or immunotherapy agents Oxaliplatin (ELOXATIN), Irinotecan (CAMPTOSAR), Leucovorin & Flourouracil (ADRUCIL).      To help prevent nausea and vomiting after your treatment, we encourage you to take your nausea medication as directed.  BELOW ARE SYMPTOMS THAT SHOULD BE REPORTED IMMEDIATELY: *FEVER GREATER THAN 100.4 F (38 C) OR HIGHER *CHILLS OR SWEATING *NAUSEA AND VOMITING THAT IS NOT CONTROLLED WITH YOUR NAUSEA MEDICATION *UNUSUAL SHORTNESS OF BREATH *UNUSUAL BRUISING OR BLEEDING *URINARY PROBLEMS (pain or burning when urinating, or frequent urination) *BOWEL PROBLEMS (unusual diarrhea, constipation, pain near the anus) TENDERNESS IN MOUTH AND THROAT WITH OR WITHOUT PRESENCE OF ULCERS (sore throat, sores in mouth, or a toothache) UNUSUAL RASH, SWELLING OR PAIN  UNUSUAL VAGINAL DISCHARGE OR ITCHING   Items with * indicate a potential emergency and should be followed up as soon as possible or go to the Emergency Department if any  problems should occur.  Please show the CHEMOTHERAPY ALERT CARD or IMMUNOTHERAPY ALERT CARD at check-in to the Emergency Department and triage nurse.  Should you have questions after your visit or need to cancel or reschedule your appointment, please contact [facility] - A DEPT OF [facility]  Dept: [phone]  and follow the prompts.  Office hours are 8:00 a.m. to 4:30 p.m. Monday - Friday. Please note that voicemails left after 4:00 p.m. may not be returned until the following business day.  We are closed weekends and major holidays. You have access to a nurse at all times for urgent questions. Please call the main number to the clinic Dept: [phone] and follow the prompts.   For any non-urgent questions, you may also contact your provider using MyChart. We now offer e-Visits for anyone 64 and older to request care online for non-urgent symptoms. For details visit [email or web address].   Also download the MyChart app! Go to the app store, search "MyChart", open the app, select [place], and log in with your MyChart username and password.  Oxaliplatin Injection What is this medication? OXALIPLATIN (ox AL i PLA tin) treats colorectal cancer. It works by slowing down the growth of cancer cells. This medicine may be used for other purposes; ask your health care provider or pharmacist if you have questions. COMMON BRAND NAME(S): Eloxatin What should I tell my care team before I take this medication? They need to know if you have any of these conditions: Heart disease History of irregular heartbeat or rhythm Liver disease Low blood cell levels (white cells, red  cells, and platelets) Lung or breathing disease, such as asthma Take medications that treat or prevent blood clots Tingling of the fingers, toes, or other nerve disorder An unusual or allergic reaction to oxaliplatin, other medications, foods, dyes, or preservatives If you or your partner are  pregnant or trying to get pregnant Breast-feeding How should I use this medication? This medication is injected into a vein. It is given by your care team in a hospital or clinic setting. Talk to your care team about the use of this medication in children. Special care may be needed. Overdosage: If you think you have taken too much of this medicine contact a poison control center or emergency room at once. NOTE: This medicine is only for you. Do not share this medicine with others. What if I miss a dose? Keep appointments for follow-up doses. It is important not to miss a dose. Call your care team if you are unable to keep an appointment. What may interact with this medication? Do not take this medication with any of the following: Cisapride Dronedarone Pimozide Thioridazine This medication may also interact with the following: Aspirin and aspirin-like medications Certain medications that treat or prevent blood clots, such as warfarin, apixaban, dabigatran, and rivaroxaban Cisplatin Cyclosporine Diuretics Medications for infection, such as acyclovir, adefovir, amphotericin B, bacitracin, cidofovir, foscarnet, ganciclovir, gentamicin, pentamidine, vancomycin NSAIDs, medications for pain and inflammation, such as ibuprofen or naproxen Other medications that cause heart rhythm changes Pamidronate Zoledronic acid This list may not describe all possible interactions. Give your health care provider a list of all the medicines, herbs, non-prescription drugs, or dietary supplements you use. Also tell them if you smoke, drink alcohol, or use illegal drugs. Some items may interact with your medicine. What should I watch for while using this medication? Your condition will be monitored carefully while you are receiving this medication. You may need blood work while taking this medication. This medication may make you feel generally unwell. This is not uncommon as chemotherapy can affect healthy  cells as well as cancer cells. Report any side effects. Continue your course of treatment even though you feel ill unless your care team tells you to stop. This medication may increase your risk of getting an infection. Call your care team for advice if you get a fever, chills, sore throat, or other symptoms of a cold or flu. Do not treat yourself. Try to avoid being around people who are sick. Avoid taking medications that contain aspirin, acetaminophen, ibuprofen, naproxen, or ketoprofen unless instructed by your care team. These medications may hide a fever. Be careful brushing or flossing your teeth or using a toothpick because you may get an infection or bleed more easily. If you have any dental work done, tell your dentist you are receiving this medication. This medication can make you more sensitive to cold. Do not drink cold drinks or use ice. Cover exposed skin before coming in contact with cold temperatures or cold objects. When out in cold weather wear warm clothing and cover your mouth and nose to warm the air that goes into your lungs. Tell your care team if you get sensitive to the cold. Talk to your care team if you or your partner are pregnant or think either of you might be pregnant. This medication can cause serious birth defects if taken during pregnancy and for 9 months after the last dose. A negative pregnancy test is required before starting this medication. A reliable form of contraception is recommended while taking  this medication and for 9 months after the last dose. Talk to your care team about effective forms of contraception. Do not father a child while taking this medication and for 6 months after the last dose. Use a condom while having sex during this time period. Do not breastfeed while taking this medication and for 3 months after the last dose. This medication may cause infertility. Talk to your care team if you are concerned about your fertility. What side effects may I  notice from receiving this medication? Side effects that you should report to your care team as soon as possible: Allergic reactions--skin rash, itching, hives, swelling of the face, lips, tongue, or throat Bleeding--bloody or black, tar-like stools, vomiting blood or brown material that looks like coffee grounds, red or dark brown urine, small red or purple spots on skin, unusual bruising or bleeding Dry cough, shortness of breath or trouble breathing Heart rhythm changes--fast or irregular heartbeat, dizziness, feeling faint or lightheaded, chest pain, trouble breathing Infection--fever, chills, cough, sore throat, wounds that don't heal, pain or trouble when passing urine, general feeling of discomfort or being unwell Liver injury--right upper belly pain, loss of appetite, nausea, light-colored stool, dark yellow or brown urine, yellowing skin or eyes, unusual weakness or fatigue Low red blood cell level--unusual weakness or fatigue, dizziness, headache, trouble breathing Muscle injury--unusual weakness or fatigue, muscle pain, dark yellow or brown urine, decrease in amount of urine Pain, tingling, or numbness in the hands or feet Sudden and severe headache, confusion, change in vision, seizures, which may be signs of posterior reversible encephalopathy syndrome (PRES) Unusual bruising or bleeding Side effects that usually do not require medical attention (report to your care team if they continue or are bothersome): Diarrhea Nausea Pain, redness, or swelling with sores inside the mouth or throat Unusual weakness or fatigue Vomiting This list may not describe all possible side effects. Call your doctor for medical advice about side effects. You may report side effects to FDA at 1-800-FDA-1088. Where should I keep my medication? This medication is given in a hospital or clinic. It will not be stored at home. NOTE: This sheet is a summary. It may not cover all possible information. If you have  questions about this medicine, talk to your doctor, pharmacist, or health care provider.  2024 Elsevier/Gold Standard (2022-01-15 00:00:00)   Irinotecan Injection What is this medication? IRINOTECAN (ir in oh TEE kan) treats some types of cancer. It works by slowing down the growth of cancer cells. This medicine may be used for other purposes; ask your health care provider or pharmacist if you have questions. COMMON BRAND NAME(S): Camptosar What should I tell my care team before I take this medication? They need to know if you have any of these conditions: Dehydration Diarrhea Infection, especially a viral infection, such as chickenpox, cold sores, herpes Liver disease Low blood cell levels (white cells, red cells, and platelets) Low levels of electrolytes, such as calcium, magnesium, or potassium in your blood Recent or ongoing radiation An unusual or allergic reaction to irinotecan, other medications, foods, dyes, or preservatives If you or your partner are pregnant or trying to get pregnant Breast-feeding How should I use this medication? This medication is injected into a vein. It is given by your care team in a hospital or clinic setting. Talk to your care team about the use of this medication in children. Special care may be needed. Overdosage: If you think you have taken too much of this medicine  contact a poison control center or emergency room at once. NOTE: This medicine is only for you. Do not share this medicine with others. What if I miss a dose? Keep appointments for follow-up doses. It is important not to miss your dose. Call your care team if you are unable to keep an appointment. What may interact with this medication? Do not take this medication with any of the following: Cobicistat Itraconazole This medication may also interact with the following: Certain antibiotics, such as clarithromycin, rifampin, rifabutin Certain antivirals for HIV or AIDS Certain  medications for fungal infections, such as ketoconazole, posaconazole, voriconazole Certain medications for seizures, such as carbamazepine, phenobarbital, phenytoin Gemfibrozil Nefazodone St. John's wort This list may not describe all possible interactions. Give your health care provider a list of all the medicines, herbs, non-prescription drugs, or dietary supplements you use. Also tell them if you smoke, drink alcohol, or use illegal drugs. Some items may interact with your medicine. What should I watch for while using this medication? Your condition will be monitored carefully while you are receiving this medication. You may need blood work while taking this medication. This medication may make you feel generally unwell. This is not uncommon as chemotherapy can affect healthy cells as well as cancer cells. Report any side effects. Continue your course of treatment even though you feel ill unless your care team tells you to stop. This medication can cause serious side effects. To reduce the risk, your care team may give you other medications to take before receiving this one. Be sure to follow the directions from your care team. This medication may affect your coordination, reaction time, or judgement. Do not drive or operate machinery until you know how this medication affects you. Sit up or stand slowly to reduce the risk of dizzy or fainting spells. Drinking alcohol with this medication can increase the risk of these side effects. This medication may increase your risk of getting an infection. Call your care team for advice if you get a fever, chills, sore throat, or other symptoms of a cold or flu. Do not treat yourself. Try to avoid being around people who are sick. Avoid taking medications that contain aspirin, acetaminophen, ibuprofen, naproxen, or ketoprofen unless instructed by your care team. These medications may hide a fever. This medication may increase your risk to bruise or bleed. Call  your care team if you notice any unusual bleeding. Be careful brushing or flossing your teeth or using a toothpick because you may get an infection or bleed more easily. If you have any dental work done, tell your dentist you are receiving this medication. Talk to your care team if you or your partner are pregnant or think either of you might be pregnant. This medication can cause serious birth defects if taken during pregnancy and for 6 months after the last dose. You will need a negative pregnancy test before starting this medication. Contraception is recommended while taking this medication and for 6 months after the last dose. Your care team can help you find the option that works for you. Do not father a child while taking this medication and for 3 months after the last dose. Use a condom for contraception during this time period. Do not breastfeed while taking this medication and for 7 days after the last dose. This medication may cause infertility. Talk to your care team if you are concerned about your fertility. What side effects may I notice from receiving this medication? Side effects that  you should report to your care team as soon as possible: Allergic reactions--skin rash, itching, hives, swelling of the face, lips, tongue, or throat Dry cough, shortness of breath or trouble breathing Increased saliva or tears, increased sweating, stomach cramping, diarrhea, small pupils, unusual weakness or fatigue, slow heartbeat Infection--fever, chills, cough, sore throat, wounds that don't heal, pain or trouble when passing urine, general feeling of discomfort or being unwell Kidney injury--decrease in the amount of urine, swelling of the ankles, hands, or feet Low red blood cell level--unusual weakness or fatigue, dizziness, headache, trouble breathing Severe or prolonged diarrhea Unusual bruising or bleeding Side effects that usually do not require medical attention (report to your care team if  they continue or are bothersome): Constipation Diarrhea Hair loss Loss of appetite Nausea Stomach pain This list may not describe all possible side effects. Call your doctor for medical advice about side effects. You may report side effects to FDA at 1-800-FDA-1088. Where should I keep my medication? This medication is given in a hospital or clinic. It will not be stored at home. NOTE: This sheet is a summary. It may not cover all possible information. If you have questions about this medicine, talk to your doctor, pharmacist, or health care provider.  2024 Elsevier/Gold Standard (2021-08-13 00:00:00)   Leucovorin Injection What is this medication? LEUCOVORIN (loo koe VOR in) prevents side effects from certain medications, such as methotrexate. It works by increasing folate levels. This helps protect healthy cells in your body. It may also be used to treat anemia caused by low levels of folate. It can also be used with fluorouracil, a type of chemotherapy, to treat colorectal cancer. It works by increasing the effects of fluorouracil in the body. This medicine may be used for other purposes; ask your health care provider or pharmacist if you have questions. What should I tell my care team before I take this medication? They need to know if you have any of these conditions: Anemia from low levels of vitamin B12 in the blood An unusual or allergic reaction to leucovorin, folic acid, other medications, foods, dyes, or preservatives Pregnant or trying to get pregnant Breastfeeding How should I use this medication? This medication is injected into a vein or a muscle. It is given by your care team in a hospital or clinic setting. Talk to your care team about the use of this medication in children. Special care may be needed. Overdosage: If you think you have taken too much of this medicine contact a poison control center or emergency room at once. NOTE: This medicine is only for you. Do not  share this medicine with others. What if I miss a dose? Keep appointments for follow-up doses. It is important not to miss your dose. Call your care team if you are unable to keep an appointment. What may interact with this medication? Capecitabine Fluorouracil Phenobarbital Phenytoin Primidone Trimethoprim;sulfamethoxazole This list may not describe all possible interactions. Give your health care provider a list of all the medicines, herbs, non-prescription drugs, or dietary supplements you use. Also tell them if you smoke, drink alcohol, or use illegal drugs. Some items may interact with your medicine. What should I watch for while using this medication? Your condition will be monitored carefully while you are receiving this medication. This medication may increase the side effects of 5-fluorouracil. Tell your care team if you have diarrhea or mouth sores that do not get better or that get worse. What side effects may I notice from  receiving this medication? Side effects that you should report to your care team as soon as possible: Allergic reactions--skin rash, itching, hives, swelling of the face, lips, tongue, or throat This list may not describe all possible side effects. Call your doctor for medical advice about side effects. You may report side effects to FDA at 1-800-FDA-1088. Where should I keep my medication? This medication is given in a hospital or clinic. It will not be stored at home. NOTE: This sheet is a summary. It may not cover all possible information. If you have questions about this medicine, talk to your doctor, pharmacist, or health care provider.  2024 Elsevier/Gold Standard (2021-09-04 00:00:00)   Fluorouracil Injection What is this medication? FLUOROURACIL (flure oh YOOR a sil) treats some types of cancer. It works by slowing down the growth of cancer cells. This medicine may be used for other purposes; ask your health care provider or pharmacist if you have  questions. COMMON BRAND NAME(S): Adrucil What should I tell my care team before I take this medication? They need to know if you have any of these conditions: Blood disorders Dihydropyrimidine dehydrogenase (DPD) deficiency Infection, such as chickenpox, cold sores, herpes Kidney disease Liver disease Poor nutrition Recent or ongoing radiation therapy An unusual or allergic reaction to fluorouracil, other medications, foods, dyes, or preservatives If you or your partner are pregnant or trying to get pregnant Breast-feeding How should I use this medication? This medication is injected into a vein. It is administered by your care team in a hospital or clinic setting. Talk to your care team about the use of this medication in children. Special care may be needed. Overdosage: If you think you have taken too much of this medicine contact a poison control center or emergency room at once. NOTE: This medicine is only for you. Do not share this medicine with others. What if I miss a dose? Keep appointments for follow-up doses. It is important not to miss your dose. Call your care team if you are unable to keep an appointment. What may interact with this medication? Do not take this medication with any of the following: Live virus vaccines This medication may also interact with the following: Medications that treat or prevent blood clots, such as warfarin, enoxaparin, dalteparin This list may not describe all possible interactions. Give your health care provider a list of all the medicines, herbs, non-prescription drugs, or dietary supplements you use. Also tell them if you smoke, drink alcohol, or use illegal drugs. Some items may interact with your medicine. What should I watch for while using this medication? Your condition will be monitored carefully while you are receiving this medication. This medication may make you feel generally unwell. This is not uncommon as chemotherapy can affect  healthy cells as well as cancer cells. Report any side effects. Continue your course of treatment even though you feel ill unless your care team tells you to stop. In some cases, you may be given additional medications to help with side effects. Follow all directions for their use. This medication may increase your risk of getting an infection. Call your care team for advice if you get a fever, chills, sore throat, or other symptoms of a cold or flu. Do not treat yourself. Try to avoid being around people who are sick. This medication may increase your risk to bruise or bleed. Call your care team if you notice any unusual bleeding. Be careful brushing or flossing your teeth or using a toothpick because  you may get an infection or bleed more easily. If you have any dental work done, tell your dentist you are receiving this medication. Avoid taking medications that contain aspirin, acetaminophen, ibuprofen, naproxen, or ketoprofen unless instructed by your care team. These medications may hide a fever. Do not treat diarrhea with over the counter products. Contact your care team if you have diarrhea that lasts more than 2 days or if it is severe and watery. This medication can make you more sensitive to the sun. Keep out of the sun. If you cannot avoid being in the sun, wear protective clothing and sunscreen. Do not use sun lamps, tanning beds, or tanning booths. Talk to your care team if you or your partner wish to become pregnant or think you might be pregnant. This medication can cause serious birth defects if taken during pregnancy and for 3 months after the last dose. A reliable form of contraception is recommended while taking this medication and for 3 months after the last dose. Talk to your care team about effective forms of contraception. Do not father a child while taking this medication and for 3 months after the last dose. Use a condom while having sex during this time period. Do not breastfeed  while taking this medication. This medication may cause infertility. Talk to your care team if you are concerned about your fertility. What side effects may I notice from receiving this medication? Side effects that you should report to your care team as soon as possible: Allergic reactions--skin rash, itching, hives, swelling of the face, lips, tongue, or throat Heart attack--pain or tightness in the chest, shoulders, arms, or jaw, nausea, shortness of breath, cold or clammy skin, feeling faint or lightheaded Heart failure--shortness of breath, swelling of the ankles, feet, or hands, sudden weight gain, unusual weakness or fatigue Heart rhythm changes--fast or irregular heartbeat, dizziness, feeling faint or lightheaded, chest pain, trouble breathing High ammonia level--unusual weakness or fatigue, confusion, loss of appetite, nausea, vomiting, seizures Infection--fever, chills, cough, sore throat, wounds that don't heal, pain or trouble when passing urine, general feeling of discomfort or being unwell Low red blood cell level--unusual weakness or fatigue, dizziness, headache, trouble breathing Pain, tingling, or numbness in the hands or feet, muscle weakness, change in vision, confusion or trouble speaking, loss of balance or coordination, trouble walking, seizures Redness, swelling, and blistering of the skin over hands and feet Severe or prolonged diarrhea Unusual bruising or bleeding Side effects that usually do not require medical attention (report to your care team if they continue or are bothersome): Dry skin Headache Increased tears Nausea Pain, redness, or swelling with sores inside the mouth or throat Sensitivity to light Vomiting This list may not describe all possible side effects. Call your doctor for medical advice about side effects. You may report side effects to FDA at 1-800-FDA-1088. Where should I keep my medication? This medication is given in a hospital or clinic. It  will not be stored at home. NOTE: This sheet is a summary. It may not cover all possible information. If you have questions about this medicine, talk to your doctor, pharmacist, or health care provider.  2024 Elsevier/Gold Standard (2021-08-07 00:00:00)   The chemotherapy medication bag should finish at 46 hours, 96 hours, or 7 days. For example, if your pump is scheduled for 46 hours and it was put on at 4:00 p.m., it should finish at 2:00 p.m. the day it is scheduled to come off regardless of your appointment time.  Estimated time to finish at 2:00 p.m. on Wednesday 03/12/2023.   If the display on your pump reads "Low Volume" and it is beeping, take the batteries out of the pump and come to the cancer center for it to be taken off.   If the pump alarms go off prior to the pump reading "Low Volume" then call [phone] and someone can assist you.  If the plunger comes out and the chemotherapy medication is leaking out, please use your home chemo spill kit to clean up the spill. Do NOT use paper towels or other household products.  If you have problems or questions regarding your pump, please call either [phone] (24 hours a day) or the cancer center Monday-Friday 8:00 a.m.- 4:30 p.m. at the clinic number and we will assist you. If you are unable to get assistance, then go to the nearest Emergency Department and ask the staff to contact the IV team for assistance.

## 2023-03-12 ENCOUNTER — Inpatient Hospital Stay: Payer: BC Managed Care – PPO | Admitting: Nutrition

## 2023-03-12 ENCOUNTER — Inpatient Hospital Stay: Payer: BC Managed Care – PPO

## 2023-03-12 ENCOUNTER — Encounter: Payer: Self-pay | Admitting: Genetic Counselor

## 2023-03-12 ENCOUNTER — Telehealth: Payer: Self-pay | Admitting: Genetic Counselor

## 2023-03-12 VITALS — BP 129/87 | HR 66 | Temp 98.1°F | Resp 18

## 2023-03-12 DIAGNOSIS — Z5189 Encounter for other specified aftercare: Secondary | ICD-10-CM | POA: Diagnosis not present

## 2023-03-12 DIAGNOSIS — C250 Malignant neoplasm of head of pancreas: Secondary | ICD-10-CM | POA: Diagnosis not present

## 2023-03-12 DIAGNOSIS — Z5111 Encounter for antineoplastic chemotherapy: Secondary | ICD-10-CM | POA: Diagnosis not present

## 2023-03-12 DIAGNOSIS — Q2381 Bicuspid aortic valve: Secondary | ICD-10-CM | POA: Diagnosis not present

## 2023-03-12 DIAGNOSIS — Z452 Encounter for adjustment and management of vascular access device: Secondary | ICD-10-CM | POA: Diagnosis not present

## 2023-03-12 DIAGNOSIS — R739 Hyperglycemia, unspecified: Secondary | ICD-10-CM | POA: Diagnosis not present

## 2023-03-12 DIAGNOSIS — D701 Agranulocytosis secondary to cancer chemotherapy: Secondary | ICD-10-CM | POA: Diagnosis not present

## 2023-03-12 DIAGNOSIS — I10 Essential (primary) hypertension: Secondary | ICD-10-CM | POA: Diagnosis not present

## 2023-03-12 DIAGNOSIS — Z1379 Encounter for other screening for genetic and chromosomal anomalies: Secondary | ICD-10-CM | POA: Insufficient documentation

## 2023-03-12 MED ORDER — PEGFILGRASTIM-JMDB 6 MG/0.6ML ~~LOC~~ SOSY
6.0000 mg | PREFILLED_SYRINGE | Freq: Once | SUBCUTANEOUS | Status: AC
Start: 2023-03-12 — End: 2023-03-12
  Administered 2023-03-12: 6 mg via SUBCUTANEOUS
  Filled 2023-03-12: qty 0.6

## 2023-03-12 MED ORDER — SODIUM CHLORIDE 0.9% FLUSH
10.0000 mL | INTRAVENOUS | Status: DC | PRN
  Administered 2023-03-12: 10 mL

## 2023-03-12 MED ORDER — HEPARIN SOD (PORK) LOCK FLUSH 100 UNIT/ML IV SOLN
500.0000 [IU] | Freq: Once | INTRAVENOUS | Status: AC | PRN
Start: 2023-03-12 — End: 2023-03-12
  Administered 2023-03-12: 500 [IU]

## 2023-03-12 NOTE — Telephone Encounter (Signed)
Results have not yet been reviewed with Mr. Warhurst. A genetic counseling referral was placed on 03/07/2023 to have him scheduled for a results review discussion.   No pathogenic variants were identified in the 71 genes analyzed. Of note, a variant of uncertain significance was identified in the ALK and APC genes. Results will be uploaded once they have been reviewed with the patient. A portion of the result report is included below for reference.   Lalla Brothers, MS, [facility] Genetic Counselor [email or web address] (P) [phone]

## 2023-03-12 NOTE — Progress Notes (Signed)
62 yo male diagnosed with Pancreas cancer and followed by Dr. Truett Perna. He is receiving FOLFIRINOX q 14 days for a total of 4 cycles. Completed cycle 1 Nov 6. Chemotherapy held Nov 19 due to neutropenia. Completed cycle 2 Nov 25.  PMH includes AAA, GERD, MRSA, HLD, HTN, Hypothyroidism, Vitamin D deficiency, hyperglycemia.  Medications include Synthroid, Prilosec, Zofran, Compazine, and Vitamin D.  Labs on Nov 25 reviewed.  Height: 5\' 8" . Weight: 146 pounds 1 oz November 25. UBW: Weighed 170 pounds Feb 2024. This is also his UBW. BMI: 22.21.  Experienced diarrhea after the first cycle of chemotherapy. States he had alternating constipation. Did not take Imodium. His appetite is okay but he is definitely eating less. He eats small meals and has consumed one protein shake daily. (Atkins brand) Reports taste alterations with most food tasting salty. Checks his blood sugars once daily and averages in the 120"s.  Nutrition Diagnosis: Unintended wt loss related to cancer and associated treatments as evidenced by 14% wt loss over 9 months.  Interventions: Educated to consume small, frequent meals and snacks with high calorie, high protein foods. Reviewed foods high in calories and protein. Educated on strategies to improve diarrhea and encouraged imodium per provider. Encouraged decreased concentrated sweets. Educated on strategies to improve taste alterations. Encouraged change supplements to Ensure Plus or Ensure complete or equivalent. Provided samples. Provided nutrition fact sheets. Questions answered and contact information given.  Monitoring, Evaluation, Goals: Patient will increase calories and protein to minimize further wt loss.  Encouraged patient to contact RD with questions or concerns.

## 2023-03-21 DIAGNOSIS — C250 Malignant neoplasm of head of pancreas: Secondary | ICD-10-CM | POA: Diagnosis not present

## 2023-03-22 ENCOUNTER — Other Ambulatory Visit: Payer: Self-pay | Admitting: Oncology

## 2023-03-22 DIAGNOSIS — C250 Malignant neoplasm of head of pancreas: Secondary | ICD-10-CM

## 2023-03-24 ENCOUNTER — Inpatient Hospital Stay: Payer: BC Managed Care – PPO

## 2023-03-24 ENCOUNTER — Inpatient Hospital Stay: Payer: BC Managed Care – PPO | Attending: Oncology

## 2023-03-24 ENCOUNTER — Ambulatory Visit: Payer: BC Managed Care – PPO

## 2023-03-24 ENCOUNTER — Inpatient Hospital Stay (HOSPITAL_BASED_OUTPATIENT_CLINIC_OR_DEPARTMENT_OTHER): Payer: BC Managed Care – PPO | Admitting: Nurse Practitioner

## 2023-03-24 ENCOUNTER — Encounter: Payer: Self-pay | Admitting: Nurse Practitioner

## 2023-03-24 VITALS — BP 128/87 | HR 66 | Temp 98.2°F | Resp 18 | Ht 68.0 in | Wt 142.6 lb

## 2023-03-24 DIAGNOSIS — C250 Malignant neoplasm of head of pancreas: Secondary | ICD-10-CM | POA: Insufficient documentation

## 2023-03-24 DIAGNOSIS — Z95828 Presence of other vascular implants and grafts: Secondary | ICD-10-CM

## 2023-03-24 DIAGNOSIS — Z5111 Encounter for antineoplastic chemotherapy: Secondary | ICD-10-CM | POA: Insufficient documentation

## 2023-03-24 DIAGNOSIS — Z5189 Encounter for other specified aftercare: Secondary | ICD-10-CM | POA: Insufficient documentation

## 2023-03-24 DIAGNOSIS — Z452 Encounter for adjustment and management of vascular access device: Secondary | ICD-10-CM | POA: Insufficient documentation

## 2023-03-24 DIAGNOSIS — Q2381 Bicuspid aortic valve: Secondary | ICD-10-CM | POA: Insufficient documentation

## 2023-03-24 DIAGNOSIS — R739 Hyperglycemia, unspecified: Secondary | ICD-10-CM | POA: Diagnosis not present

## 2023-03-24 DIAGNOSIS — I10 Essential (primary) hypertension: Secondary | ICD-10-CM | POA: Insufficient documentation

## 2023-03-24 LAB — CMP (CANCER CENTER ONLY)
ALT: 113 U/L — ABNORMAL HIGH (ref 0–44)
AST: 50 U/L — ABNORMAL HIGH (ref 15–41)
Albumin: 3.3 g/dL — ABNORMAL LOW (ref 3.5–5.0)
Alkaline Phosphatase: 445 U/L — ABNORMAL HIGH (ref 38–126)
Anion gap: 7 (ref 5–15)
BUN: 15 mg/dL (ref 8–23)
CO2: 28 mmol/L (ref 22–32)
Calcium: 8.7 mg/dL — ABNORMAL LOW (ref 8.9–10.3)
Chloride: 103 mmol/L (ref 98–111)
Creatinine: 0.86 mg/dL (ref 0.61–1.24)
GFR, Estimated: >60 mL/min (ref >60)
Glucose, Bld: 97 mg/dL (ref 70–99)
Potassium: 3.6 mmol/L (ref 3.5–5.1)
Sodium: 138 mmol/L (ref 135–145)
Total Bilirubin: 1 mg/dL (ref <1.2)
Total Protein: 6.2 g/dL — ABNORMAL LOW (ref 6.5–8.1)

## 2023-03-24 LAB — CBC WITH DIFFERENTIAL (CANCER CENTER ONLY)
Abs Immature Granulocytes: 1.94 10*3/uL — ABNORMAL HIGH (ref 0.00–0.07)
Basophils Absolute: 0 10*3/uL (ref 0.0–0.1)
Basophils Relative: 0 %
Eosinophils Absolute: 0.3 10*3/uL (ref 0.0–0.5)
Eosinophils Relative: 2 %
HCT: 31.6 % — ABNORMAL LOW (ref 39.0–52.0)
Hemoglobin: 10.9 g/dL — ABNORMAL LOW (ref 13.0–17.0)
Immature Granulocytes: 12 %
Lymphocytes Relative: 12 %
Lymphs Abs: 1.9 10*3/uL (ref 0.7–4.0)
MCH: 29.9 pg (ref 26.0–34.0)
MCHC: 34.5 g/dL (ref 30.0–36.0)
MCV: 86.8 fL (ref 80.0–100.0)
Monocytes Absolute: 0.9 10*3/uL (ref 0.1–1.0)
Monocytes Relative: 6 %
Neutro Abs: 11.4 10*3/uL — ABNORMAL HIGH (ref 1.7–7.7)
Neutrophils Relative %: 68 %
Platelet Count: 160 10*3/uL (ref 150–400)
RBC: 3.64 MIL/uL — ABNORMAL LOW (ref 4.22–5.81)
RDW: 14.5 % (ref 11.5–15.5)
WBC Count: 16.4 10*3/uL — ABNORMAL HIGH (ref 4.0–10.5)
nRBC: 0 % (ref 0.0–0.2)

## 2023-03-24 MED ORDER — SODIUM CHLORIDE 0.9% FLUSH
10.0000 mL | INTRAVENOUS | Status: DC | PRN
  Administered 2023-03-24: 10 mL via INTRAVENOUS

## 2023-03-24 MED ORDER — SODIUM CHLORIDE 0.9% FLUSH: 10.0000 mL | INTRAVENOUS | Status: DC | PRN

## 2023-03-24 MED ORDER — HEPARIN SOD (PORK) LOCK FLUSH 100 UNIT/ML IV SOLN
500.0000 [IU] | Freq: Once | INTRAVENOUS | Status: AC
  Administered 2023-03-24: 500 [IU] via INTRAVENOUS

## 2023-03-24 NOTE — Progress Notes (Signed)
[facility] OFFICE PROGRESS NOTE   Diagnosis: Pancreas cancer  INTERVAL HISTORY:   Randy Allen returns as scheduled.  Treatment was held 03/04/2023 due to neutropenia.  He completed cycle 2 FOLFIRINOX 03/10/2023.  He received Fulphila on day of pump discontinuation.  He denies nausea/vomiting.  No mouth sores.  No significant diarrhea.  Cold sensitivity lasted 3 to 4 days.  No persistent neuropathy symptoms.  Appetite overall is poor.  Weight is down.  About a week ago he had "rigors" 2 consecutive nights.  Maximum temperature 100.1.  No coughing or shortness of breath.  No urinary symptoms though he notes urine has been dark.  A COVID test was negative.  Objective:  Vital signs in last 24 hours:  Blood pressure 128/87, pulse 66, temperature 98.2 F (36.8 C), temperature source Oral, resp. rate 18, height 5\' 8"  (1.727 m), weight 142 lb 9.6 oz (64.7 kg), SpO2 99%.    HEENT: No thrush or ulcers. Resp: Lungs clear bilaterally. Cardio: Regular rate and rhythm.  2/6 to 3/6 systolic murmur. GI: No hepatosplenomegaly. Vascular: No leg edema. Neuro: Alert and oriented. Skin: Palms without erythema. Port-A-Cath without erythema.  Lab Results:  Lab Results  Component Value Date   WBC 16.4 (H) 03/24/2023   HGB 10.9 (L) 03/24/2023   HCT 31.6 (L) 03/24/2023   MCV 86.8 03/24/2023   PLT 160 03/24/2023   NEUTROABS 11.4 (H) 03/24/2023    Imaging:  No results found.  Medications: I have reviewed the patient's current medications.  Assessment/Plan: Pancreas cancer Elevated liver enzymes/bilirubin September 2024 CT abdomen/pelvis 01/10/2023-no focal liver abnormality, no biliary dilation, pancreas unremarkable MRI/MRCP 01/15/2023-mild intra and extrahepatic biliary duct dilation, common duct is abruptly truncated in the superior pancreas head, hypoenhancing mass in the central pancreas head measuring 2.6 x 2.4 cm, remaining pancreas is atrophic with dilation of the main  pancreatic duct, mass contacts the adjacent central SMV and portal confluence with less than 180 degrees of contact, preserved fat plane to the SMA with no enlarged abdominal lymph nodes, no ascites ERCP 01/18/2023-single stenosis in the lower third of the main bile duct, brushings obtained, plastic stent placed-adenocarcinoma EUS 02/06/2023-30 x 30 mm pancreas head mass with irregular outer margins, abutment of the portal vein and SMV with intact fat planes, intact interface between the mass and SMA and celiac trunk, common bile duct stent, no malignant appearing lymph nodes, T2 N0 Cycle 1 FOLFIRINOX 02/19/2023 Chemotherapy held 03/04/2023 due to neutropenia Cycle 2 FOLFIRINOX 03/10/2023, Fulphila Cycle 3 held 03/24/2023 due to liver function abnormalities Abdomen/back pain secondary to #1 Bicuspid aortic valve Hypertension Antral gastritis and ulcers on EUS 02/06/2023-Prilosec Hyperglycemia  Disposition: Randy Allen appears stable.  He has completed 2 cycles of FOLFIRINOX.  He is tolerating chemotherapy well.  He had a low-grade fever and 2 episodes of rigors last week.  He has recurrent liver function abnormalities.  We are concerned for possible stent obstruction.  We are holding chemotherapy today.  He will return for repeat labs on 03/27/2023.  He understands to contact the office sooner with fever, chills, jaundice.  We are rescheduling chemotherapy for 1 week.  [name]    Randy Allen ANP/GNP-BC   03/24/2023  10:14 AM  This was a shared visit with Randy Allen.  Randy Allen has completed 2 cycles of FOLFIRINOX.  He reports a low-grade fever, rigors, and dark urine following the last cycle of chemotherapy.  The liver enzymes are elevated today.  We are  concerned he may be developing stent occlusion.  He will seek medical attention for a recurrent fever.  He will return for a repeat chemistry panel later this week.  We will obtain imaging and GI consultation if the liver  enzymes continue to rise.  I was present for greater than 50% of today's visit.  I performed Medical Decision Making.  Mancel Bale, MD

## 2023-03-24 NOTE — Addendum Note (Signed)
Addended by: Dimitri Ped on: 03/24/2023 10:45 AM   Modules accepted: Orders

## 2023-03-24 NOTE — Progress Notes (Signed)
Patient seen by Lonna Cobb NP today  Vitals are within treatment parameters:No (Please specify and give further instructions.) ALT 113 Labs are within treatment parameters: Yes   Treatment plan has been signed: Yes   Per physician team, Patient will not be receiving treatment today.

## 2023-03-24 NOTE — Patient Instructions (Signed)
 [employer], Adult A central line is a long, thin tube (catheter) that can be used to collect blood for testing or to give medicine through a vein. The tip of the central line ends in a large vein just above the heart (vena cava). A central line may be placed because: You need to get medicines or fluids through an IV for a long period of time. You need nutrition but cannot eat or absorb nutrients. The veins in your hands or arms are difficult to use for IV access. You need a blood transfusion. You need chemotherapy or dialysis. Types of central lines There are four main types of central lines: Peripherally inserted central catheter (PICC) line. This type is used for access of one week or longer. It can be used to draw blood and give fluids or medicines. A PICC looks like an IV tube, but it goes up the arm to the heart. It is usually inserted in the upper arm and taped in place on the arm. Tunneled central line. This type is used for long-term therapy and dialysis. It is placed in a large vein in the neck, chest, or groin. It is inserted through a small incision made over the vein, and then it is advanced to the heart. It is tunneled under the skin and brought out through a second incision. Non-tunneled central line. This type is used for short-term access, usually for a maximum of 7 days. It is often used in the emergency department. It is inserted in the neck, chest, or groin. Implanted port. This type is used for long-term therapy. It can stay in place longer than other types of central lines. It is normally inserted in the upper chest, but it can also be placed in the upper arm or the abdomen. It is inserted and removed with surgery, and it is accessed using a needle. The type of central line that you receive depends on how long you will need it, your medical condition, and the condition of your veins. Tell a health care provider about: Any allergies you have. All medicines you are taking,  including vitamins, herbs, eye drops, creams, and over-the-counter medicines. Any problems you or family members have had with anesthetic medicines. Any blood disorders you have. Any surgeries you have had. Any medical conditions you have. Whether you are pregnant or may be pregnant. What are the risks? Generally, placement and use of a central line is safe. However, problems may occur, including: Infection. A blood clot that blocks the central line or forms in the vein and travels to the heart. Bleeding from the place where the central line was inserted. Developing a hole or crack within the central line. If this happens, the central line will need to be replaced. Central line failure. The catheter moving or coming out of place. What happens before the procedure? Medicines Ask your health care provider about: Changing or stopping your regular medicines. This is especially important if you are taking diabetes medicines or blood thinners. Taking medicines such as aspirin and ibuprofen. These medicines can thin your blood. Do not take these medicines unless your health care provider tells you to take them. Taking over-the-counter medicines, vitamins, herbs, and supplements. General instructions Follow instructions from your health care provider about eating or drinking restrictions. Ask your health care provider: How your procedure site will be marked. What steps will be taken to help prevent infection. These steps may include: Removing hair at the procedure site. Washing skin with a germ-killing soap.  Plan to have a responsible adult take you home from the hospital or clinic. If you will be going home right after the procedure, plan to have a responsible adult care for you for the time you are told. This is important. What happens during the procedure? The procedure will vary depending on the type of central line being placed. In general: An IV will be inserted into one of your  veins. You will be given one or more of the following: A medicine to help you relax (sedative). A medicine to numb the area (local anesthetic). Your skin will be cleaned with a germ-killing (antiseptic) solution, and you may be covered with sterile drapes. Your blood pressure, heart rate, breathing rate, and blood oxygen level will be monitored during the procedure. The central line catheter will be inserted into the vein and advanced to the correct spot. The health care provider may use X-ray equipment to help guide the catheter to the right place. A bandage (dressing) will be placed over the insertion area. The procedure may vary among health care providers and hospitals. What can I expect after the procedure? Your blood pressure, heart rate, breathing rate, and blood oxygen level will be monitored until you leave the hospital or clinic. Antiseptic caps may be placed on the ends of the central line tubing. If you were given a sedative during the procedure, it can affect you for several hours. Do not drive or operate machinery until your health care provider says that it is safe. Follow these instructions at home: Flushing and cleaning the central line  Follow instructions from your health care provider about flushing and cleaning the central line and the area around it. Only use sterile supplies to flush the central line. Use supplies from your health care provider, a pharmacy, or another source that is recommended by your health care provider. Before you flush the central line or clean the central line or the area around it: Wash your hands with soap and water for at least 20 seconds. If soap and water are not available, use alcohol-based hand sanitizer. Clean the central line hub with rubbing alcohol. Unless directed otherwise by the manufacturer's instructions, scrub using a twisting motion and rub for 10 to 15 seconds or for 30 twists. Be sure you scrub the top of the hub, not just the  sides. Never reuse alcohol pads. Let the hub dry before use. Prevent it from touching anything while drying. Caring for the incision or central line site Check your incision or central line site every day for signs of infection. Check for: Redness, swelling, or pain. Fluid or blood. Warmth. Pus or a bad smell. Keep the insertion site of your central line clean and dry at all times. Change your dressing only as told by your health care provider. Keep your dressing dry. If it gets wet, have it changed as soon as possible. General instructions Follow instructions from your health care provider for the type of device that you have. Keep the tube clamped, unless it is being used. If the central line accidentally gets pulled on, make sure: The dressing is okay. There is no bleeding. The line has not been pulled out. Do not use scissors or sharp objects near the tube. Do not take baths, swim, or use a hot tub until your health care provider approves. Ask your health care provider if you may take showers. You may only be allowed to take sponge baths. Ask your health care provider what activities are  safe for you. You may be restricted from lifting or making repetitive arm movements on the side of your central line. Take over-the-counter and prescription medicines only as told by your health care provider. Keep all follow-up visits. This is important. Storage and disposal of supplies Keep your supplies in a clean, dry location. Throw away any used syringes in a disposal container that is meant for sharp items (sharps container). You can buy a sharps container from a pharmacy, or you can make one by using an empty hard plastic bottle with a cover. Place any used dressings or infusion bags into a plastic bag. Throw that bag in the trash. Contact a health care provider if: You have redness, swelling, or pain around your insertion site. You have fluid or blood coming from your insertion site. Your  insertion site feels warm to the touch. You have pus or a bad smell coming from your insertion site. Get help right away if: You have: A fever or chills. Shortness of breath. Chest pain or a racing heartbeat. Swelling in your neck, face, chest, or arm on the side of your central line. You feel dizzy or you faint. Your incision or central line site has red streaks spreading away from the area. Your incision or central line site is bleeding and does not stop. Your central line is difficult to flush or will not flush. You do not get a blood return from the central line. Your central line gets loose or damaged or comes out. Your catheter leaks when flushed or when fluids are infused into it. Summary A central line is a long, thin tube (catheter) that can be used to give medicine through a vein. Follow specific instructions from your health care provider for the type of device that you have. Keep the insertion site of your central line clean and dry at all times. Keep the tube clamped, unless it is being used. This information is not intended to replace advice given to you by your health care provider. Make sure you discuss any questions you have with your health care provider. Document Revised: 12/01/2019 Document Reviewed: 12/02/2019 Elsevier Patient Education  2024 [employer].

## 2023-03-26 ENCOUNTER — Inpatient Hospital Stay: Payer: BC Managed Care – PPO

## 2023-03-27 ENCOUNTER — Inpatient Hospital Stay: Payer: BC Managed Care – PPO

## 2023-03-27 DIAGNOSIS — I10 Essential (primary) hypertension: Secondary | ICD-10-CM | POA: Diagnosis not present

## 2023-03-27 DIAGNOSIS — C250 Malignant neoplasm of head of pancreas: Secondary | ICD-10-CM

## 2023-03-27 DIAGNOSIS — Q2381 Bicuspid aortic valve: Secondary | ICD-10-CM | POA: Diagnosis not present

## 2023-03-27 DIAGNOSIS — Z5189 Encounter for other specified aftercare: Secondary | ICD-10-CM | POA: Diagnosis not present

## 2023-03-27 DIAGNOSIS — R739 Hyperglycemia, unspecified: Secondary | ICD-10-CM | POA: Diagnosis not present

## 2023-03-27 DIAGNOSIS — Z5111 Encounter for antineoplastic chemotherapy: Secondary | ICD-10-CM | POA: Diagnosis not present

## 2023-03-27 DIAGNOSIS — Z452 Encounter for adjustment and management of vascular access device: Secondary | ICD-10-CM | POA: Diagnosis not present

## 2023-03-27 LAB — CMP (CANCER CENTER ONLY)
ALT: 110 U/L — ABNORMAL HIGH (ref 0–44)
AST: 94 U/L — ABNORMAL HIGH (ref 15–41)
Albumin: 3.5 g/dL (ref 3.5–5.0)
Alkaline Phosphatase: 539 U/L — ABNORMAL HIGH (ref 38–126)
Anion gap: 4 — ABNORMAL LOW (ref 5–15)
BUN: 15 mg/dL (ref 8–23)
CO2: 30 mmol/L (ref 22–32)
Calcium: 9.3 mg/dL (ref 8.9–10.3)
Chloride: 105 mmol/L (ref 98–111)
Creatinine: 0.84 mg/dL (ref 0.61–1.24)
GFR, Estimated: >60 mL/min (ref >60)
Glucose, Bld: 91 mg/dL (ref 70–99)
Potassium: 4.5 mmol/L (ref 3.5–5.1)
Sodium: 139 mmol/L (ref 135–145)
Total Bilirubin: 1 mg/dL (ref <1.2)
Total Protein: 6.5 g/dL (ref 6.5–8.1)

## 2023-03-28 ENCOUNTER — Telehealth: Payer: Self-pay | Admitting: Nurse Practitioner

## 2023-03-28 NOTE — Telephone Encounter (Signed)
I contacted Randy Allen to review the chemistry panel results from yesterday.  LFTs are stable.  Bilirubin is not elevated.  Plan to proceed with the next cycle of chemotherapy as scheduled 04/01/2023 as long as LFTs remain stable.  He agrees with this plan.

## 2023-04-01 ENCOUNTER — Inpatient Hospital Stay: Payer: BC Managed Care – PPO

## 2023-04-01 ENCOUNTER — Inpatient Hospital Stay (HOSPITAL_BASED_OUTPATIENT_CLINIC_OR_DEPARTMENT_OTHER): Payer: BC Managed Care – PPO | Admitting: Nurse Practitioner

## 2023-04-01 ENCOUNTER — Encounter: Payer: Self-pay | Admitting: Nurse Practitioner

## 2023-04-01 VITALS — BP 115/86 | HR 59 | Temp 98.2°F | Ht 68.0 in | Wt 144.7 lb

## 2023-04-01 VITALS — BP 98/68 | HR 58 | Resp 18

## 2023-04-01 DIAGNOSIS — C250 Malignant neoplasm of head of pancreas: Secondary | ICD-10-CM

## 2023-04-01 DIAGNOSIS — Z452 Encounter for adjustment and management of vascular access device: Secondary | ICD-10-CM | POA: Diagnosis not present

## 2023-04-01 DIAGNOSIS — Z5189 Encounter for other specified aftercare: Secondary | ICD-10-CM | POA: Diagnosis not present

## 2023-04-01 DIAGNOSIS — Z5111 Encounter for antineoplastic chemotherapy: Secondary | ICD-10-CM | POA: Diagnosis not present

## 2023-04-01 DIAGNOSIS — Q2381 Bicuspid aortic valve: Secondary | ICD-10-CM | POA: Diagnosis not present

## 2023-04-01 DIAGNOSIS — I10 Essential (primary) hypertension: Secondary | ICD-10-CM | POA: Diagnosis not present

## 2023-04-01 DIAGNOSIS — R739 Hyperglycemia, unspecified: Secondary | ICD-10-CM | POA: Diagnosis not present

## 2023-04-01 LAB — CBC WITH DIFFERENTIAL (CANCER CENTER ONLY)
Abs Immature Granulocytes: 0.12 10*3/uL — ABNORMAL HIGH (ref 0.00–0.07)
Basophils Absolute: 0.1 10*3/uL (ref 0.0–0.1)
Basophils Relative: 2 %
Eosinophils Absolute: 0.1 10*3/uL (ref 0.0–0.5)
Eosinophils Relative: 1 %
HCT: 34.2 % — ABNORMAL LOW (ref 39.0–52.0)
Hemoglobin: 11.4 g/dL — ABNORMAL LOW (ref 13.0–17.0)
Immature Granulocytes: 2 %
Lymphocytes Relative: 26 %
Lymphs Abs: 1.8 10*3/uL (ref 0.7–4.0)
MCH: 30 pg (ref 26.0–34.0)
MCHC: 33.3 g/dL (ref 30.0–36.0)
MCV: 90 fL (ref 80.0–100.0)
Monocytes Absolute: 0.6 10*3/uL (ref 0.1–1.0)
Monocytes Relative: 8 %
Neutro Abs: 4.2 10*3/uL (ref 1.7–7.7)
Neutrophils Relative %: 61 %
Platelet Count: 460 10*3/uL — ABNORMAL HIGH (ref 150–400)
RBC: 3.8 MIL/uL — ABNORMAL LOW (ref 4.22–5.81)
RDW: 15.5 % (ref 11.5–15.5)
WBC Count: 7 10*3/uL (ref 4.0–10.5)
nRBC: 0 % (ref 0.0–0.2)

## 2023-04-01 LAB — CMP (CANCER CENTER ONLY)
ALT: 154 U/L — ABNORMAL HIGH (ref 0–44)
AST: 108 U/L — ABNORMAL HIGH (ref 15–41)
Albumin: 3.6 g/dL (ref 3.5–5.0)
Alkaline Phosphatase: 592 U/L — ABNORMAL HIGH (ref 38–126)
Anion gap: 6 (ref 5–15)
BUN: 12 mg/dL (ref 8–23)
CO2: 28 mmol/L (ref 22–32)
Calcium: 9.2 mg/dL (ref 8.9–10.3)
Chloride: 104 mmol/L (ref 98–111)
Creatinine: 0.83 mg/dL (ref 0.61–1.24)
GFR, Estimated: >60 mL/min (ref >60)
Glucose, Bld: 86 mg/dL (ref 70–99)
Potassium: 4 mmol/L (ref 3.5–5.1)
Sodium: 138 mmol/L (ref 135–145)
Total Bilirubin: 0.9 mg/dL (ref <1.2)
Total Protein: 6.5 g/dL (ref 6.5–8.1)

## 2023-04-01 MED ORDER — DEXAMETHASONE SODIUM PHOSPHATE 10 MG/ML IJ SOLN
10.0000 mg | Freq: Once | INTRAMUSCULAR | Status: AC
  Administered 2023-04-01: 10 mg via INTRAVENOUS
  Filled 2023-04-01: qty 1

## 2023-04-01 MED ORDER — PROCHLORPERAZINE MALEATE 10 MG PO TABS: 10.0000 mg | ORAL_TABLET | Freq: Four times a day (QID) | ORAL | 1 refills | Status: DC | PRN

## 2023-04-01 MED ORDER — DEXTROSE 5 % IV SOLN: INTRAVENOUS | Status: DC

## 2023-04-01 MED ORDER — SODIUM CHLORIDE 0.9 % IV SOLN
150.0000 mg/m2 | Freq: Once | INTRAVENOUS | Status: AC
  Administered 2023-04-01: 300 mg via INTRAVENOUS
  Filled 2023-04-01: qty 15

## 2023-04-01 MED ORDER — SODIUM CHLORIDE 0.9 % IV SOLN
2400.0000 mg/m2 | INTRAVENOUS | Status: DC
  Administered 2023-04-01: 4350 mg via INTRAVENOUS
  Filled 2023-04-01: qty 87

## 2023-04-01 MED ORDER — ATROPINE SULFATE 1 MG/ML IV SOLN
0.5000 mg | Freq: Once | INTRAVENOUS | Status: AC | PRN
  Administered 2023-04-01: 0.5 mg via INTRAVENOUS
  Filled 2023-04-01: qty 1

## 2023-04-01 MED ORDER — SODIUM CHLORIDE 0.9 % IV SOLN
150.0000 mg | Freq: Once | INTRAVENOUS | Status: AC
  Administered 2023-04-01: 150 mg via INTRAVENOUS
  Filled 2023-04-01: qty 150

## 2023-04-01 MED ORDER — PALONOSETRON HCL INJECTION 0.25 MG/5ML
0.2500 mg | Freq: Once | INTRAVENOUS | Status: AC
  Administered 2023-04-01: 0.25 mg via INTRAVENOUS
  Filled 2023-04-01: qty 5

## 2023-04-01 MED ORDER — OXALIPLATIN CHEMO INJECTION 100 MG/20ML
85.0000 mg/m2 | Freq: Once | INTRAVENOUS | Status: AC
  Administered 2023-04-01: 150 mg via INTRAVENOUS
  Filled 2023-04-01: qty 20

## 2023-04-01 MED ORDER — LEUCOVORIN CALCIUM INJECTION 350 MG
400.0000 mg/m2 | Freq: Once | INTRAMUSCULAR | Status: AC
  Administered 2023-04-01: 724 mg via INTRAVENOUS
  Filled 2023-04-01: qty 36.2

## 2023-04-01 NOTE — Progress Notes (Signed)
Patient seen by Lonna Cobb NP today  Vitals are within treatment parameters:Yes   Labs are within treatment parameters: No (Please specify and give further instructions.) AST/ALT/ Alkaline Phosphatase. It is ok to proceed  Treatment plan has been signed: Yes   Per physician team, Patient is ready for treatment and there are NO modifications to the treatment plan.

## 2023-04-01 NOTE — Patient Instructions (Signed)
[facility]   Discharge Instructions: Thank you for choosing [facility] to provide your oncology and hematology care.   If you have a lab appointment with the Cancer Center, please go directly to the Cancer Center and check in at the registration area.   Wear comfortable clothing and clothing appropriate for easy access to any Portacath or PICC line.   We strive to give you quality time with your provider. You may need to reschedule your appointment if you arrive late (15 or more minutes).  Arriving late affects you and other patients whose appointments are after yours.  Also, if you miss three or more appointments without notifying the office, you may be dismissed from the clinic at the provider's discretion.      For prescription refill requests, have your pharmacy contact our office and allow 72 hours for refills to be completed.    Today you received the following chemotherapy and/or immunotherapy agents Oxaliplatin (ELOXATIN), Irinotecan (CAMPTOSAR), Leucovorin & Flourouracil (ADRUCIL).      To help prevent nausea and vomiting after your treatment, we encourage you to take your nausea medication as directed.  BELOW ARE SYMPTOMS THAT SHOULD BE REPORTED IMMEDIATELY: *FEVER GREATER THAN 100.4 F (38 C) OR HIGHER *CHILLS OR SWEATING *NAUSEA AND VOMITING THAT IS NOT CONTROLLED WITH YOUR NAUSEA MEDICATION *UNUSUAL SHORTNESS OF BREATH *UNUSUAL BRUISING OR BLEEDING *URINARY PROBLEMS (pain or burning when urinating, or frequent urination) *BOWEL PROBLEMS (unusual diarrhea, constipation, pain near the anus) TENDERNESS IN MOUTH AND THROAT WITH OR WITHOUT PRESENCE OF ULCERS (sore throat, sores in mouth, or a toothache) UNUSUAL RASH, SWELLING OR PAIN  UNUSUAL VAGINAL DISCHARGE OR ITCHING   Items with * indicate a potential emergency and should be followed up as soon as possible or go to the Emergency Department if any  problems should occur.  Please show the CHEMOTHERAPY ALERT CARD or IMMUNOTHERAPY ALERT CARD at check-in to the Emergency Department and triage nurse.  Should you have questions after your visit or need to cancel or reschedule your appointment, please contact [facility]  Dept: [phone]  and follow the prompts.  Office hours are 8:00 a.m. to 4:30 p.m. Monday - Friday. Please note that voicemails left after 4:00 p.m. may not be returned until the following business day.  We are closed weekends and major holidays. You have access to a nurse at all times for urgent questions. Please call the main number to the clinic Dept: [phone] and follow the prompts.   For any non-urgent questions, you may also contact your provider using MyChart. We now offer e-Visits for anyone 43 and older to request care online for non-urgent symptoms. For details visit [email or web address].   Also download the MyChart app! Go to the app store, search "MyChart", open the app, select [facility], and log in with your MyChart username and password.  Oxaliplatin Injection What is this medication? OXALIPLATIN (ox AL i PLA tin) treats colorectal cancer. It works by slowing down the growth of cancer cells. This medicine may be used for other purposes; ask your health care provider or pharmacist if you have questions. COMMON BRAND NAME(S): Eloxatin What should I tell my care team before I take this medication? They need to know if you have any of these conditions: Heart disease History of irregular heartbeat or rhythm Liver disease Low blood cell levels (white cells, red  cells, and platelets) Lung or breathing disease, such as asthma Take medications that treat or prevent blood clots Tingling of the fingers, toes, or other nerve disorder An unusual or allergic reaction to oxaliplatin, other medications, foods, dyes, or preservatives If you or your partner are  pregnant or trying to get pregnant Breast-feeding How should I use this medication? This medication is injected into a vein. It is given by your care team in a hospital or clinic setting. Talk to your care team about the use of this medication in children. Special care may be needed. Overdosage: If you think you have taken too much of this medicine contact a poison control center or emergency room at once. NOTE: This medicine is only for you. Do not share this medicine with others. What if I miss a dose? Keep appointments for follow-up doses. It is important not to miss a dose. Call your care team if you are unable to keep an appointment. What may interact with this medication? Do not take this medication with any of the following: Cisapride Dronedarone Pimozide Thioridazine This medication may also interact with the following: Aspirin and aspirin-like medications Certain medications that treat or prevent blood clots, such as warfarin, apixaban, dabigatran, and rivaroxaban Cisplatin Cyclosporine Diuretics Medications for infection, such as acyclovir, adefovir, amphotericin B, bacitracin, cidofovir, foscarnet, ganciclovir, gentamicin, pentamidine, vancomycin NSAIDs, medications for pain and inflammation, such as ibuprofen or naproxen Other medications that cause heart rhythm changes Pamidronate Zoledronic acid This list may not describe all possible interactions. Give your health care provider a list of all the medicines, herbs, non-prescription drugs, or dietary supplements you use. Also tell them if you smoke, drink alcohol, or use illegal drugs. Some items may interact with your medicine. What should I watch for while using this medication? Your condition will be monitored carefully while you are receiving this medication. You may need blood work while taking this medication. This medication may make you feel generally unwell. This is not uncommon as chemotherapy can affect healthy  cells as well as cancer cells. Report any side effects. Continue your course of treatment even though you feel ill unless your care team tells you to stop. This medication may increase your risk of getting an infection. Call your care team for advice if you get a fever, chills, sore throat, or other symptoms of a cold or flu. Do not treat yourself. Try to avoid being around people who are sick. Avoid taking medications that contain aspirin, acetaminophen, ibuprofen, naproxen, or ketoprofen unless instructed by your care team. These medications may hide a fever. Be careful brushing or flossing your teeth or using a toothpick because you may get an infection or bleed more easily. If you have any dental work done, tell your dentist you are receiving this medication. This medication can make you more sensitive to cold. Do not drink cold drinks or use ice. Cover exposed skin before coming in contact with cold temperatures or cold objects. When out in cold weather wear warm clothing and cover your mouth and nose to warm the air that goes into your lungs. Tell your care team if you get sensitive to the cold. Talk to your care team if you or your partner are pregnant or think either of you might be pregnant. This medication can cause serious birth defects if taken during pregnancy and for 9 months after the last dose. A negative pregnancy test is required before starting this medication. A reliable form of contraception is recommended while taking  this medication and for 9 months after the last dose. Talk to your care team about effective forms of contraception. Do not father a child while taking this medication and for 6 months after the last dose. Use a condom while having sex during this time period. Do not breastfeed while taking this medication and for 3 months after the last dose. This medication may cause infertility. Talk to your care team if you are concerned about your fertility. What side effects may I  notice from receiving this medication? Side effects that you should report to your care team as soon as possible: Allergic reactions--skin rash, itching, hives, swelling of the face, lips, tongue, or throat Bleeding--bloody or black, tar-like stools, vomiting blood or brown material that looks like coffee grounds, red or dark brown urine, small red or purple spots on skin, unusual bruising or bleeding Dry cough, shortness of breath or trouble breathing Heart rhythm changes--fast or irregular heartbeat, dizziness, feeling faint or lightheaded, chest pain, trouble breathing Infection--fever, chills, cough, sore throat, wounds that don't heal, pain or trouble when passing urine, general feeling of discomfort or being unwell Liver injury--right upper belly pain, loss of appetite, nausea, light-colored stool, dark yellow or brown urine, yellowing skin or eyes, unusual weakness or fatigue Low red blood cell level--unusual weakness or fatigue, dizziness, headache, trouble breathing Muscle injury--unusual weakness or fatigue, muscle pain, dark yellow or brown urine, decrease in amount of urine Pain, tingling, or numbness in the hands or feet Sudden and severe headache, confusion, change in vision, seizures, which may be signs of posterior reversible encephalopathy syndrome (PRES) Unusual bruising or bleeding Side effects that usually do not require medical attention (report to your care team if they continue or are bothersome): Diarrhea Nausea Pain, redness, or swelling with sores inside the mouth or throat Unusual weakness or fatigue Vomiting This list may not describe all possible side effects. Call your doctor for medical advice about side effects. You may report side effects to FDA at 1-800-FDA-1088. Where should I keep my medication? This medication is given in a hospital or clinic. It will not be stored at home. NOTE: This sheet is a summary. It may not cover all possible information. If you have  questions about this medicine, talk to your doctor, pharmacist, or health care provider.  2024 Elsevier/Gold Standard (2022-01-15 00:00:00)  Irinotecan Injection What is this medication? IRINOTECAN (ir in oh TEE kan) treats some types of cancer. It works by slowing down the growth of cancer cells. This medicine may be used for other purposes; ask your health care provider or pharmacist if you have questions. COMMON BRAND NAME(S): Camptosar What should I tell my care team before I take this medication? They need to know if you have any of these conditions: Dehydration Diarrhea Infection, especially a viral infection, such as chickenpox, cold sores, herpes Liver disease Low blood cell levels (white cells, red cells, and platelets) Low levels of electrolytes, such as calcium, magnesium, or potassium in your blood Recent or ongoing radiation An unusual or allergic reaction to irinotecan, other medications, foods, dyes, or preservatives If you or your partner are pregnant or trying to get pregnant Breast-feeding How should I use this medication? This medication is injected into a vein. It is given by your care team in a hospital or clinic setting. Talk to your care team about the use of this medication in children. Special care may be needed. Overdosage: If you think you have taken too much of this medicine contact  a poison control center or emergency room at once. NOTE: This medicine is only for you. Do not share this medicine with others. What if I miss a dose? Keep appointments for follow-up doses. It is important not to miss your dose. Call your care team if you are unable to keep an appointment. What may interact with this medication? Do not take this medication with any of the following: Cobicistat Itraconazole This medication may also interact with the following: Certain antibiotics, such as clarithromycin, rifampin, rifabutin Certain antivirals for HIV or AIDS Certain  medications for fungal infections, such as ketoconazole, posaconazole, voriconazole Certain medications for seizures, such as carbamazepine, phenobarbital, phenytoin Gemfibrozil Nefazodone St. John's wort This list may not describe all possible interactions. Give your health care provider a list of all the medicines, herbs, non-prescription drugs, or dietary supplements you use. Also tell them if you smoke, drink alcohol, or use illegal drugs. Some items may interact with your medicine. What should I watch for while using this medication? Your condition will be monitored carefully while you are receiving this medication. You may need blood work while taking this medication. This medication may make you feel generally unwell. This is not uncommon as chemotherapy can affect healthy cells as well as cancer cells. Report any side effects. Continue your course of treatment even though you feel ill unless your care team tells you to stop. This medication can cause serious side effects. To reduce the risk, your care team may give you other medications to take before receiving this one. Be sure to follow the directions from your care team. This medication may affect your coordination, reaction time, or judgement. Do not drive or operate machinery until you know how this medication affects you. Sit up or stand slowly to reduce the risk of dizzy or fainting spells. Drinking alcohol with this medication can increase the risk of these side effects. This medication may increase your risk of getting an infection. Call your care team for advice if you get a fever, chills, sore throat, or other symptoms of a cold or flu. Do not treat yourself. Try to avoid being around people who are sick. Avoid taking medications that contain aspirin, acetaminophen, ibuprofen, naproxen, or ketoprofen unless instructed by your care team. These medications may hide a fever. This medication may increase your risk to bruise or bleed. Call  your care team if you notice any unusual bleeding. Be careful brushing or flossing your teeth or using a toothpick because you may get an infection or bleed more easily. If you have any dental work done, tell your dentist you are receiving this medication. Talk to your care team if you or your partner are pregnant or think either of you might be pregnant. This medication can cause serious birth defects if taken during pregnancy and for 6 months after the last dose. You will need a negative pregnancy test before starting this medication. Contraception is recommended while taking this medication and for 6 months after the last dose. Your care team can help you find the option that works for you. Do not father a child while taking this medication and for 3 months after the last dose. Use a condom for contraception during this time period. Do not breastfeed while taking this medication and for 7 days after the last dose. This medication may cause infertility. Talk to your care team if you are concerned about your fertility. What side effects may I notice from receiving this medication? Side effects that you  should report to your care team as soon as possible: Allergic reactions--skin rash, itching, hives, swelling of the face, lips, tongue, or throat Dry cough, shortness of breath or trouble breathing Increased saliva or tears, increased sweating, stomach cramping, diarrhea, small pupils, unusual weakness or fatigue, slow heartbeat Infection--fever, chills, cough, sore throat, wounds that don't heal, pain or trouble when passing urine, general feeling of discomfort or being unwell Kidney injury--decrease in the amount of urine, swelling of the ankles, hands, or feet Low red blood cell level--unusual weakness or fatigue, dizziness, headache, trouble breathing Severe or prolonged diarrhea Unusual bruising or bleeding Side effects that usually do not require medical attention (report to your care team if  they continue or are bothersome): Constipation Diarrhea Hair loss Loss of appetite Nausea Stomach pain This list may not describe all possible side effects. Call your doctor for medical advice about side effects. You may report side effects to FDA at 1-800-FDA-1088. Where should I keep my medication? This medication is given in a hospital or clinic. It will not be stored at home. NOTE: This sheet is a summary. It may not cover all possible information. If you have questions about this medicine, talk to your doctor, pharmacist, or health care provider.  2024 Elsevier/Gold Standard (2021-08-13 00:00:00)  Leucovorin Injection What is this medication? LEUCOVORIN (loo koe VOR in) prevents side effects from certain medications, such as methotrexate. It works by increasing folate levels. This helps protect healthy cells in your body. It may also be used to treat anemia caused by low levels of folate. It can also be used with fluorouracil, a type of chemotherapy, to treat colorectal cancer. It works by increasing the effects of fluorouracil in the body. This medicine may be used for other purposes; ask your health care provider or pharmacist if you have questions. What should I tell my care team before I take this medication? They need to know if you have any of these conditions: Anemia from low levels of vitamin B12 in the blood An unusual or allergic reaction to leucovorin, folic acid, other medications, foods, dyes, or preservatives Pregnant or trying to get pregnant Breastfeeding How should I use this medication? This medication is injected into a vein or a muscle. It is given by your care team in a hospital or clinic setting. Talk to your care team about the use of this medication in children. Special care may be needed. Overdosage: If you think you have taken too much of this medicine contact a poison control center or emergency room at once. NOTE: This medicine is only for you. Do not  share this medicine with others. What if I miss a dose? Keep appointments for follow-up doses. It is important not to miss your dose. Call your care team if you are unable to keep an appointment. What may interact with this medication? Capecitabine Fluorouracil Phenobarbital Phenytoin Primidone Trimethoprim;sulfamethoxazole This list may not describe all possible interactions. Give your health care provider a list of all the medicines, herbs, non-prescription drugs, or dietary supplements you use. Also tell them if you smoke, drink alcohol, or use illegal drugs. Some items may interact with your medicine. What should I watch for while using this medication? Your condition will be monitored carefully while you are receiving this medication. This medication may increase the side effects of 5-fluorouracil. Tell your care team if you have diarrhea or mouth sores that do not get better or that get worse. What side effects may I notice from receiving this  medication? Side effects that you should report to your care team as soon as possible: Allergic reactions--skin rash, itching, hives, swelling of the face, lips, tongue, or throat This list may not describe all possible side effects. Call your doctor for medical advice about side effects. You may report side effects to FDA at 1-800-FDA-1088. Where should I keep my medication? This medication is given in a hospital or clinic. It will not be stored at home. NOTE: This sheet is a summary. It may not cover all possible information. If you have questions about this medicine, talk to your doctor, pharmacist, or health care provider.  2024 Elsevier/Gold Standard (2021-09-04 00:00:00)  Fluorouracil Injection What is this medication? FLUOROURACIL (flure oh YOOR a sil) treats some types of cancer. It works by slowing down the growth of cancer cells. This medicine may be used for other purposes; ask your health care provider or pharmacist if you have  questions. COMMON BRAND NAME(S): Adrucil What should I tell my care team before I take this medication? They need to know if you have any of these conditions: Blood disorders Dihydropyrimidine dehydrogenase (DPD) deficiency Infection, such as chickenpox, cold sores, herpes Kidney disease Liver disease Poor nutrition Recent or ongoing radiation therapy An unusual or allergic reaction to fluorouracil, other medications, foods, dyes, or preservatives If you or your partner are pregnant or trying to get pregnant Breast-feeding How should I use this medication? This medication is injected into a vein. It is administered by your care team in a hospital or clinic setting. Talk to your care team about the use of this medication in children. Special care may be needed. Overdosage: If you think you have taken too much of this medicine contact a poison control center or emergency room at once. NOTE: This medicine is only for you. Do not share this medicine with others. What if I miss a dose? Keep appointments for follow-up doses. It is important not to miss your dose. Call your care team if you are unable to keep an appointment. What may interact with this medication? Do not take this medication with any of the following: Live virus vaccines This medication may also interact with the following: Medications that treat or prevent blood clots, such as warfarin, enoxaparin, dalteparin This list may not describe all possible interactions. Give your health care provider a list of all the medicines, herbs, non-prescription drugs, or dietary supplements you use. Also tell them if you smoke, drink alcohol, or use illegal drugs. Some items may interact with your medicine. What should I watch for while using this medication? Your condition will be monitored carefully while you are receiving this medication. This medication may make you feel generally unwell. This is not uncommon as chemotherapy can affect  healthy cells as well as cancer cells. Report any side effects. Continue your course of treatment even though you feel ill unless your care team tells you to stop. In some cases, you may be given additional medications to help with side effects. Follow all directions for their use. This medication may increase your risk of getting an infection. Call your care team for advice if you get a fever, chills, sore throat, or other symptoms of a cold or flu. Do not treat yourself. Try to avoid being around people who are sick. This medication may increase your risk to bruise or bleed. Call your care team if you notice any unusual bleeding. Be careful brushing or flossing your teeth or using a toothpick because you may get  an infection or bleed more easily. If you have any dental work done, tell your dentist you are receiving this medication. Avoid taking medications that contain aspirin, acetaminophen, ibuprofen, naproxen, or ketoprofen unless instructed by your care team. These medications may hide a fever. Do not treat diarrhea with over the counter products. Contact your care team if you have diarrhea that lasts more than 2 days or if it is severe and watery. This medication can make you more sensitive to the sun. Keep out of the sun. If you cannot avoid being in the sun, wear protective clothing and sunscreen. Do not use sun lamps, tanning beds, or tanning booths. Talk to your care team if you or your partner wish to become pregnant or think you might be pregnant. This medication can cause serious birth defects if taken during pregnancy and for 3 months after the last dose. A reliable form of contraception is recommended while taking this medication and for 3 months after the last dose. Talk to your care team about effective forms of contraception. Do not father a child while taking this medication and for 3 months after the last dose. Use a condom while having sex during this time period. Do not breastfeed  while taking this medication. This medication may cause infertility. Talk to your care team if you are concerned about your fertility. What side effects may I notice from receiving this medication? Side effects that you should report to your care team as soon as possible: Allergic reactions--skin rash, itching, hives, swelling of the face, lips, tongue, or throat Heart attack--pain or tightness in the chest, shoulders, arms, or jaw, nausea, shortness of breath, cold or clammy skin, feeling faint or lightheaded Heart failure--shortness of breath, swelling of the ankles, feet, or hands, sudden weight gain, unusual weakness or fatigue Heart rhythm changes--fast or irregular heartbeat, dizziness, feeling faint or lightheaded, chest pain, trouble breathing High ammonia level--unusual weakness or fatigue, confusion, loss of appetite, nausea, vomiting, seizures Infection--fever, chills, cough, sore throat, wounds that don't heal, pain or trouble when passing urine, general feeling of discomfort or being unwell Low red blood cell level--unusual weakness or fatigue, dizziness, headache, trouble breathing Pain, tingling, or numbness in the hands or feet, muscle weakness, change in vision, confusion or trouble speaking, loss of balance or coordination, trouble walking, seizures Redness, swelling, and blistering of the skin over hands and feet Severe or prolonged diarrhea Unusual bruising or bleeding Side effects that usually do not require medical attention (report to your care team if they continue or are bothersome): Dry skin Headache Increased tears Nausea Pain, redness, or swelling with sores inside the mouth or throat Sensitivity to light Vomiting This list may not describe all possible side effects. Call your doctor for medical advice about side effects. You may report side effects to FDA at 1-800-FDA-1088. Where should I keep my medication? This medication is given in a hospital or clinic. It  will not be stored at home. NOTE: This sheet is a summary. It may not cover all possible information. If you have questions about this medicine, talk to your doctor, pharmacist, or health care provider.  2024 [employer]/Gold Standard (2021-08-07 00:00:00)  The chemotherapy medication bag should finish at 46 hours, 96 hours, or 7 days. For example, if your pump is scheduled for 46 hours and it was put on at 4:00 p.m., it should finish at 2:00 p.m. the day it is scheduled to come off regardless of your appointment time.     Estimated  time to finish at 1:45 p.m. on Thursday 04/03/2023.   If the display on your pump reads "Low Volume" and it is beeping, take the batteries out of the pump and come to the cancer center for it to be taken off.   If the pump alarms go off prior to the pump reading "Low Volume" then call [phone] and someone can assist you.  If the plunger comes out and the chemotherapy medication is leaking out, please use your home chemo spill kit to clean up the spill. Do NOT use paper towels or other household products.  If you have problems or questions regarding your pump, please call either [phone] (24 hours a day) or the cancer center Monday-Friday 8:00 a.m.- 4:30 p.m. at the clinic number and we will assist you. If you are unable to get assistance, then go to the nearest Emergency Department and ask the staff to contact the IV team for assistance.

## 2023-04-01 NOTE — Progress Notes (Signed)
  [facility] OFF[facility] PROGRESS NOTE   Diagnosis: Pancreas cancer  INTERVAL HISTORY:   [facility] returns as scheduled.  He completed cycle 2 FOLFIRINOX 03/10/2023.  Treatment was held 03/24/2023 due to liver function abnormalities.  He overall feels well.  No fever or chills.  No nausea.  No diarrhea.  Objective:  Vital signs in last 24 hours:  Blood pressure 115/86, pulse (!) 59, temperature 98.2 F (36.8 C), temperature source Temporal, height 5\' 8"  (1.727 m), weight 144 lb 11.2 oz (65.6 kg), SpO2 100%.    HEENT: No thrush or ulcers. Resp: Lungs clear bilaterally. Cardio: Regular rate and rhythm. GI: No hepatosplenomegaly.  Abdomen soft and nontender. Vascular: No leg edema. Skin: Palms without erythema. Port-A-Cath without erythema.  Lab Results:  Lab Results  Component Value Date   WBC 7.0 04/01/2023   HGB 11.4 (L) 04/01/2023   HCT 34.2 (L) 04/01/2023   MCV 90.0 04/01/2023   PLT 460 (H) 04/01/2023   NEUTROABS 4.2 04/01/2023    Imaging:  No results found.  Medications: I have reviewed the patient's current medications.  Assessment/Plan: Pancreas cancer Elevated liver enzymes/bilirubin September 2024 CT abdomen/pelvis 01/10/2023-no focal liver abnormality, no biliary dilation, pancreas unremarkable MRI/MRCP 01/15/2023-mild intra and extrahepatic biliary duct dilation, common duct is abruptly truncated in the superior pancreas head, hypoenhancing mass in the central pancreas head measuring 2.6 x 2.4 cm, remaining pancreas is atrophic with dilation of the main pancreatic duct, mass contacts the adjacent central SMV and portal confluence with less than 180 degrees of contact, preserved fat plane to the SMA with no enlarged abdominal lymph nodes, no ascites ERCP 01/18/2023-single stenosis in the lower third of the main bile duct, brushings obtained, plastic stent placed-adenocarcinoma EUS 02/06/2023-30 x 30 mm pancreas head mass with irregular outer margins,  abutment of the portal vein and SMV with intact fat planes, intact interface between the mass and SMA and celiac trunk, common bile duct stent, no malignant appearing lymph nodes, T2 N0 Cycle 1 FOLFIRINOX 02/19/2023 Chemotherapy held 03/04/2023 due to neutropenia Cycle 2 FOLFIRINOX 03/10/2023, Fulphila Cycle 3 held 03/24/2023 due to liver function abnormalities Cycle 3 FOLFIRINOX 04/01/2023, Fulphila Abdomen/back pain secondary to #1 Bicuspid aortic valve Hypertension Antral gastritis and ulcers on EUS 02/06/2023-Prilosec Hyperglycemia  Disposition: [facility] appears stable.  He has completed 2 cycles of FOLFIRINOX.  Cycle 3 was held last week due to liver function abnormalities.  We were concerned for developing stent occlusion.  He had labs repeated later in the week, stable.  We reviewed the CBC and chemistry panel from today.  AST/ALT/alkaline phosphatase remain elevated, bilirubin is in normal range.  Plan to proceed with treatment today as scheduled.  He understands to contact the office with fever, chills, other problems.  He will return for follow-up and treatment in 2 weeks.  We are available to see him sooner if needed.  [name]    [facility] ANP/GNP-BC   04/01/2023  9:23 AM

## 2023-04-02 ENCOUNTER — Inpatient Hospital Stay: Payer: BC Managed Care – PPO | Admitting: Nutrition

## 2023-04-02 ENCOUNTER — Other Ambulatory Visit: Payer: Self-pay | Admitting: Internal Medicine

## 2023-04-02 DIAGNOSIS — I10 Essential (primary) hypertension: Secondary | ICD-10-CM

## 2023-04-02 MED ORDER — ENALAPRIL MALEATE 10 MG PO TABS: ORAL_TABLET | ORAL | 1 refills | Status: DC

## 2023-04-02 NOTE — Progress Notes (Signed)
Telephone follow up completed with patient receiving chemotherapy for Pancreas cancer and followed by Dr. Truett Perna.  Weight is stable fluctuating between 142 pounds and 146 pounds. Current weight documented as 144 pounds 11.2 oz.  Labs reviewed.  Patient has a reduced appetite and feels a bit nauseated today. He has taken compazine. He is eating normally for him. Has been drinking carnation breakfast essentials daily. Notes he was sick for a few days and thinks that is when he lost a few pounds.  Nutrition Diagnosis: Unintended weight loss stable.  Intervention: Continue to manage nausea with medications as needed. Eat smaller amounts more often. Continue ONS for weight maintenance.  Monitoring, Evaluation, Goals: Tolerate oral intake to minimize weight loss.  Next Visit: To be scheduled as needed.

## 2023-04-03 ENCOUNTER — Inpatient Hospital Stay: Payer: BC Managed Care – PPO

## 2023-04-03 VITALS — BP 109/73 | HR 71 | Temp 97.6°F | Resp 18

## 2023-04-03 DIAGNOSIS — Z5111 Encounter for antineoplastic chemotherapy: Secondary | ICD-10-CM | POA: Diagnosis not present

## 2023-04-03 DIAGNOSIS — I10 Essential (primary) hypertension: Secondary | ICD-10-CM | POA: Diagnosis not present

## 2023-04-03 DIAGNOSIS — Z452 Encounter for adjustment and management of vascular access device: Secondary | ICD-10-CM | POA: Diagnosis not present

## 2023-04-03 DIAGNOSIS — C250 Malignant neoplasm of head of pancreas: Secondary | ICD-10-CM

## 2023-04-03 DIAGNOSIS — Q2381 Bicuspid aortic valve: Secondary | ICD-10-CM | POA: Diagnosis not present

## 2023-04-03 DIAGNOSIS — R739 Hyperglycemia, unspecified: Secondary | ICD-10-CM | POA: Diagnosis not present

## 2023-04-03 DIAGNOSIS — Z5189 Encounter for other specified aftercare: Secondary | ICD-10-CM | POA: Diagnosis not present

## 2023-04-03 MED ORDER — SODIUM CHLORIDE 0.9% FLUSH
10.0000 mL | INTRAVENOUS | Status: DC | PRN
  Administered 2023-04-03: 10 mL

## 2023-04-03 MED ORDER — PEGFILGRASTIM-JMDB 6 MG/0.6ML ~~LOC~~ SOSY
6.0000 mg | PREFILLED_SYRINGE | Freq: Once | SUBCUTANEOUS | Status: AC
  Administered 2023-04-03: 6 mg via SUBCUTANEOUS
  Filled 2023-04-03: qty 0.6

## 2023-04-03 MED ORDER — HEPARIN SOD (PORK) LOCK FLUSH 100 UNIT/ML IV SOLN
500.0000 [IU] | Freq: Once | INTRAVENOUS | Status: AC | PRN
Start: 2023-04-03 — End: 2023-04-03
  Administered 2023-04-03: 500 [IU]

## 2023-04-03 NOTE — Patient Instructions (Signed)

## 2023-04-07 ENCOUNTER — Inpatient Hospital Stay: Payer: BC Managed Care – PPO

## 2023-04-07 ENCOUNTER — Ambulatory Visit: Payer: BC Managed Care – PPO | Admitting: Oncology

## 2023-04-07 ENCOUNTER — Other Ambulatory Visit: Payer: BC Managed Care – PPO

## 2023-04-08 ENCOUNTER — Ambulatory Visit: Payer: BC Managed Care – PPO

## 2023-04-12 ENCOUNTER — Other Ambulatory Visit: Payer: Self-pay | Admitting: Oncology

## 2023-04-12 DIAGNOSIS — C250 Malignant neoplasm of head of pancreas: Secondary | ICD-10-CM

## 2023-04-15 ENCOUNTER — Inpatient Hospital Stay: Payer: BC Managed Care – PPO

## 2023-04-15 ENCOUNTER — Inpatient Hospital Stay (HOSPITAL_BASED_OUTPATIENT_CLINIC_OR_DEPARTMENT_OTHER): Payer: BC Managed Care – PPO | Admitting: Oncology

## 2023-04-15 VITALS — BP 124/90 | HR 65

## 2023-04-15 VITALS — BP 123/85 | HR 66 | Temp 98.1°F | Resp 18 | Ht 68.0 in | Wt 143.7 lb

## 2023-04-15 DIAGNOSIS — Z452 Encounter for adjustment and management of vascular access device: Secondary | ICD-10-CM | POA: Diagnosis not present

## 2023-04-15 DIAGNOSIS — I10 Essential (primary) hypertension: Secondary | ICD-10-CM | POA: Diagnosis not present

## 2023-04-15 DIAGNOSIS — R739 Hyperglycemia, unspecified: Secondary | ICD-10-CM | POA: Diagnosis not present

## 2023-04-15 DIAGNOSIS — C250 Malignant neoplasm of head of pancreas: Secondary | ICD-10-CM

## 2023-04-15 DIAGNOSIS — Z5111 Encounter for antineoplastic chemotherapy: Secondary | ICD-10-CM | POA: Diagnosis not present

## 2023-04-15 DIAGNOSIS — Z5189 Encounter for other specified aftercare: Secondary | ICD-10-CM | POA: Diagnosis not present

## 2023-04-15 DIAGNOSIS — Q2381 Bicuspid aortic valve: Secondary | ICD-10-CM | POA: Diagnosis not present

## 2023-04-15 LAB — CMP (CANCER CENTER ONLY)
ALT: 96 U/L — ABNORMAL HIGH (ref 0–44)
AST: 45 U/L — ABNORMAL HIGH (ref 15–41)
Albumin: 3.6 g/dL (ref 3.5–5.0)
Alkaline Phosphatase: 643 U/L — ABNORMAL HIGH (ref 38–126)
Anion gap: 7 (ref 5–15)
BUN: 16 mg/dL (ref 8–23)
CO2: 28 mmol/L (ref 22–32)
Calcium: 9 mg/dL (ref 8.9–10.3)
Chloride: 104 mmol/L (ref 98–111)
Creatinine: 0.82 mg/dL (ref 0.61–1.24)
GFR, Estimated: >60 mL/min (ref >60)
Glucose, Bld: 112 mg/dL — ABNORMAL HIGH (ref 70–99)
Potassium: 3.4 mmol/L — ABNORMAL LOW (ref 3.5–5.1)
Sodium: 139 mmol/L (ref 135–145)
Total Bilirubin: 0.8 mg/dL (ref 0.0–1.2)
Total Protein: 6.1 g/dL — ABNORMAL LOW (ref 6.5–8.1)

## 2023-04-15 LAB — CBC WITH DIFFERENTIAL (CANCER CENTER ONLY)
Abs Immature Granulocytes: 0 10*3/uL (ref 0.00–0.07)
Basophils Absolute: 0 10*3/uL (ref 0.0–0.1)
Basophils Relative: 0 %
Blasts: 11 %
Eosinophils Absolute: 0 10*3/uL (ref 0.0–0.5)
Eosinophils Relative: 0 %
HCT: 33.4 % — ABNORMAL LOW (ref 39.0–52.0)
Hemoglobin: 11.1 g/dL — ABNORMAL LOW (ref 13.0–17.0)
Lymphocytes Relative: 15 %
Lymphs Abs: 2.1 10*3/uL (ref 0.7–4.0)
MCH: 29.7 pg (ref 26.0–34.0)
MCHC: 33.2 g/dL (ref 30.0–36.0)
MCV: 89.3 fL (ref 80.0–100.0)
Monocytes Absolute: 0.6 10*3/uL (ref 0.1–1.0)
Monocytes Relative: 4 %
Neutro Abs: 9.8 10*3/uL — ABNORMAL HIGH (ref 1.7–7.7)
Neutrophils Relative %: 70 %
Platelet Count: 191 10*3/uL (ref 150–400)
RBC: 3.74 MIL/uL — ABNORMAL LOW (ref 4.22–5.81)
RDW: 16.5 % — ABNORMAL HIGH (ref 11.5–15.5)
WBC Count: 14 10*3/uL — ABNORMAL HIGH (ref 4.0–10.5)
nRBC: 0.2 % (ref 0.0–0.2)

## 2023-04-15 MED ORDER — PALONOSETRON HCL INJECTION 0.25 MG/5ML
0.2500 mg | Freq: Once | INTRAVENOUS | Status: AC
  Administered 2023-04-15: 0.25 mg via INTRAVENOUS
  Filled 2023-04-15: qty 5

## 2023-04-15 MED ORDER — ATROPINE SULFATE 1 MG/ML IV SOLN
0.5000 mg | Freq: Once | INTRAVENOUS | Status: AC | PRN
  Administered 2023-04-15: 0.5 mg via INTRAVENOUS
  Filled 2023-04-15: qty 1

## 2023-04-15 MED ORDER — DEXAMETHASONE SODIUM PHOSPHATE 10 MG/ML IJ SOLN
10.0000 mg | Freq: Once | INTRAMUSCULAR | Status: AC
  Administered 2023-04-15: 10 mg via INTRAVENOUS
  Filled 2023-04-15: qty 1

## 2023-04-15 MED ORDER — SODIUM CHLORIDE 0.9 % IV SOLN
150.0000 mg/m2 | Freq: Once | INTRAVENOUS | Status: AC
  Administered 2023-04-15: 300 mg via INTRAVENOUS
  Filled 2023-04-15: qty 15

## 2023-04-15 MED ORDER — SODIUM CHLORIDE 0.9 % IV SOLN
2400.0000 mg/m2 | INTRAVENOUS | Status: DC
  Administered 2023-04-15: 4350 mg via INTRAVENOUS
  Filled 2023-04-15: qty 87

## 2023-04-15 MED ORDER — ONDANSETRON HCL 8 MG PO TABS: 8.0000 mg | ORAL_TABLET | Freq: Three times a day (TID) | ORAL | 1 refills | Status: DC | PRN

## 2023-04-15 MED ORDER — OXALIPLATIN CHEMO INJECTION 100 MG/20ML
85.0000 mg/m2 | Freq: Once | INTRAVENOUS | Status: AC
  Administered 2023-04-15: 150 mg via INTRAVENOUS
  Filled 2023-04-15: qty 20

## 2023-04-15 MED ORDER — SODIUM CHLORIDE 0.9 % IV SOLN
150.0000 mg | Freq: Once | INTRAVENOUS | Status: AC
  Administered 2023-04-15: 150 mg via INTRAVENOUS
  Filled 2023-04-15: qty 150

## 2023-04-15 MED ORDER — DEXTROSE 5 % IV SOLN: INTRAVENOUS | Status: DC

## 2023-04-15 MED ORDER — SODIUM CHLORIDE 0.9 % IV SOLN
400.0000 mg/m2 | Freq: Once | INTRAVENOUS | Status: AC
  Administered 2023-04-15: 724 mg via INTRAVENOUS
  Filled 2023-04-15: qty 36.2

## 2023-04-15 NOTE — Progress Notes (Signed)
  [facility] OFFICE PROGRESS NOTE   Diagnosis: Pancreas cancer  INTERVAL HISTORY:   [facility] returns as scheduled.  He completed another cycle of FOLFIRINOX on 04/01/2023.  No diarrhea.  He had cold sensitivity for 1 week following chemotherapy.  No neuropathy symptoms at present.  Nausea following chemotherapy is relieved with Compazine .  Good appetite.  He has intermittent fullness in the subxiphoid region.  No mouth sores.  The abdominal l pain he had prior to chemotherapy has resolved.  Objective:  Vital signs in last 24 hours:  Blood pressure 123/85, pulse 66, temperature 98.1 F (36.7 C), temperature source Temporal, resp. rate 18, height 5' 8 (1.727 m), weight 143 lb 11.2 oz (65.2 kg), SpO2 97%.    HEENT: No thrush or ulcers Resp: Lungs clear bilaterally Cardio: Regular rate and rhythm GI: No hepatosplenomegaly Vascular: No leg edema  Skin: Palms without erythema  Portacath/PICC-without erythema  Lab Results:  Lab Results  Component Value Date   WBC 14.0 (H) 04/15/2023   HGB 11.1 (L) 04/15/2023   HCT 33.4 (L) 04/15/2023   MCV 89.3 04/15/2023   PLT 191 04/15/2023   NEUTROABS PENDING 04/15/2023    CMP  Lab Results  Component Value Date   NA 139 04/15/2023   K 3.4 (L) 04/15/2023   CL 104 04/15/2023   CO2 28 04/15/2023   GLUCOSE 112 (H) 04/15/2023   BUN 16 04/15/2023   CREATININE 0.82 04/15/2023   CALCIUM  9.0 04/15/2023   PROT 6.1 (L) 04/15/2023   ALBUMIN  3.6 04/15/2023   AST 45 (H) 04/15/2023   ALT 96 (H) 04/15/2023   ALKPHOS 643 (H) 04/15/2023   BILITOT 0.8 04/15/2023   GFRNONAA >60 04/15/2023   GFRAA 97 07/04/2020     Medications: I have reviewed the patient's current medications.   Assessment/Plan: Pancreas cancer Elevated liver enzymes/bilirubin September 2024 CT abdomen/pelvis 01/10/2023-no focal liver abnormality, no biliary dilation, pancreas unremarkable MRI/MRCP 01/15/2023-mild intra and extrahepatic biliary duct dilation,  common duct is abruptly truncated in the superior pancreas head, hypoenhancing mass in the central pancreas head measuring 2.6 x 2.4 cm, remaining pancreas is atrophic with dilation of the main pancreatic duct, mass contacts the adjacent central SMV and portal confluence with less than 180 degrees of contact, preserved fat plane to the SMA with no enlarged abdominal lymph nodes, no ascites ERCP 01/18/2023-single stenosis in the lower third of the main bile duct, brushings obtained, plastic stent placed-adenocarcinoma EUS 02/06/2023-30 x 30 mm pancreas head mass with irregular outer margins, abutment of the portal vein and SMV with intact fat planes, intact interface between the mass and SMA and celiac trunk, common bile duct stent, no malignant appearing lymph nodes, T2 N0 Cycle 1 FOLFIRINOX 02/19/2023 Chemotherapy held 03/04/2023 due to neutropenia Cycle 2 FOLFIRINOX 03/10/2023, Fulphila  Cycle 3 held 03/24/2023 due to liver function abnormalities Cycle 3 FOLFIRINOX 04/01/2023, Fulphila  Cycle 4 FOLFIRINOX 04/15/2023, Fila Abdomen/back pain secondary to #1 Bicuspid aortic valve Hypertension Antral gastritis and ulcers on EUS 02/06/2023-Prilosec Hyperglycemia    Disposition: [facility] appears stable.  He is tolerating the FOLFIRINOX well.  He will complete another cycle today.  He will return for an office visit and chemotherapy in 2 weeks.  He will be referred for a restaging CT evaluation after cycle 6.  Arley Hof, MD  04/15/2023  9:44 AM

## 2023-04-15 NOTE — Progress Notes (Signed)
 CRITICAL VALUE STICKER  CRITICAL VALUE: 11 Blasts  RECEIVER (on-site recipient of call): Garen, LPN  DATE & TIME NOTIFIED: 04/15/23 @ [phone]  MESSENGER (representative from lab):Starleen  MD NOTIFIED: Dr. Cloretta  TIME OF NOTIFICATION:[id number]  RESPONSE:  OK-caused by Neulasta    Patient seen by Dr. Arley Cloretta today  Vitals are within treatment parameters:Yes   Labs are within treatment parameters: No (Please specify and give further instructions.)  Blast cells 11--OK to proceed ALT 96-OK to proceed (improved)  Treatment plan has been signed: Yes   Per physician team, Patient is ready for treatment and there are NO modifications to the treatment plan.

## 2023-04-15 NOTE — Patient Instructions (Addendum)
 [facility] - A DEPT OF [facility]   The chemotherapy medication bag should finish at 46 hours, 96 hours, or 7 days. For example, if your pump is scheduled for 46 hours and it was put on at 4:00 p.m., it should finish at 2:00 p.m. the day it is scheduled to come off regardless of your appointment time.     Estimated time to finish at 1:30, Thursday, April 17, 2023.   If the display on your pump reads Low Volume and it is beeping, take the batteries out of the pump and come to the cancer center for it to be taken off.   If the pump alarms go off prior to the pump reading Low Volume then call [phone] and someone can assist you.  If the plunger comes out and the chemotherapy medication is leaking out, please use your home chemo spill kit to clean up the spill. Do NOT use paper towels or other household products.  If you have problems or questions regarding your pump, please call either [phone] (24 hours a day) or the cancer center Monday-Friday 8:00 a.m.- 4:30 p.m. at the clinic number and we will assist you. If you are unable to get assistance, then go to the nearest Emergency Department and ask the staff to contact the IV team for assistance.  Discharge Instructions: Thank you for choosing [facility] to provide your oncology and hematology care.   If you have a lab appointment with the Cancer Center, please go directly to the Cancer Center and check in at the registration area.   Wear comfortable clothing and clothing appropriate for easy access to any Portacath or PICC line.   We strive to give you quality time with your provider. You may need to reschedule your appointment if you arrive late (15 or more minutes).  Arriving late affects you and other patients whose appointments are after yours.  Also, if you miss three or more appointments without notifying the office, you may be dismissed from the clinic at the provider's  discretion.      For prescription refill requests, have your pharmacy contact our office and allow 72 hours for refills to be completed.    Today you received the following chemotherapy and/or immunotherapy agents Oxaliplatin , Leucovorin , Irinotecan , Fluorouracil       To help prevent nausea and vomiting after your treatment, we encourage you to take your nausea medication as directed.  BELOW ARE SYMPTOMS THAT SHOULD BE REPORTED IMMEDIATELY: *FEVER GREATER THAN 100.4 F (38 C) OR HIGHER *CHILLS OR SWEATING *NAUSEA AND VOMITING THAT IS NOT CONTROLLED WITH YOUR NAUSEA MEDICATION *UNUSUAL SHORTNESS OF BREATH *UNUSUAL BRUISING OR BLEEDING *URINARY PROBLEMS (pain or burning when urinating, or frequent urination) *BOWEL PROBLEMS (unusual diarrhea, constipation, pain near the anus) TENDERNESS IN MOUTH AND THROAT WITH OR WITHOUT PRESENCE OF ULCERS (sore throat, sores in mouth, or a toothache) UNUSUAL RASH, SWELLING OR PAIN  UNUSUAL VAGINAL DISCHARGE OR ITCHING   Items with * indicate a potential emergency and should be followed up as soon as possible or go to the Emergency Department if any problems should occur.  Please show the CHEMOTHERAPY ALERT CARD or IMMUNOTHERAPY ALERT CARD at check-in to the Emergency Department and triage nurse.  Should you have questions after your visit or need to cancel or reschedule your appointment, please contact [facility] - A DEPT OF [facility]  Dept: [phone]  and follow the prompts.  Office hours are 8:00 a.m. to 4:30 p.m. Monday - Friday. Please note that voicemails left after 4:00 p.m. may not be returned until the following business day.  We are closed weekends and major holidays. You have access to a nurse at all times for urgent questions. Please call the main number to the clinic Dept: [phone] and follow the prompts.   For any non-urgent questions, you may also contact your provider using MyChart. We now offer  e-Visits for anyone 29 and older to request care online for non-urgent symptoms. For details visit [email or web address].   Also download the MyChart app! Go to the app store, search MyChart, open the app, select [facility], and log in with your MyChart username and password.  Oxaliplatin  Injection What is this medication? OXALIPLATIN  (ox AL i PLA tin) treats colorectal cancer. It works by slowing down the growth of cancer cells. This medicine may be used for other purposes; ask your health care provider or pharmacist if you have questions. COMMON BRAND NAME(S): Eloxatin  What should I tell my care team before I take this medication? They need to know if you have any of these conditions: Heart disease History of irregular heartbeat or rhythm Liver disease Low blood cell levels (white cells, red cells, and platelets) Lung or breathing disease, such as asthma Take medications that treat or prevent blood clots Tingling of the fingers, toes, or other nerve disorder An unusual or allergic reaction to oxaliplatin , other medications, foods, dyes, or preservatives If you or your partner are pregnant or trying to get pregnant Breast-feeding How should I use this medication? This medication is injected into a vein. It is given by your care team in a hospital or clinic setting. Talk to your care team about the use of this medication in children. Special care may be needed. Overdosage: If you think you have taken too much of this medicine contact a poison control center or emergency room at once. NOTE: This medicine is only for you. Do not share this medicine with others. What if I miss a dose? Keep appointments for follow-up doses. It is important not to miss a dose. Call your care team if you are unable to keep an appointment. What may interact with this medication? Do not take this medication with any of the following: Cisapride Dronedarone Pimozide Thioridazine This medication may also  interact with the following: Aspirin and aspirin-like medications Certain medications that treat or prevent blood clots, such as warfarin, apixaban, dabigatran, and rivaroxaban Cisplatin Cyclosporine Diuretics Medications for infection, such as acyclovir, adefovir, amphotericin B, bacitracin, cidofovir, foscarnet, ganciclovir, gentamicin, pentamidine, vancomycin  NSAIDs, medications for pain and inflammation, such as ibuprofen or naproxen Other medications that cause heart rhythm changes Pamidronate Zoledronic acid This list may not describe all possible interactions. Give your health care provider a list of all the medicines, herbs, non-prescription drugs, or dietary supplements you use. Also tell them if you smoke, drink alcohol, or use illegal drugs. Some items may interact with your medicine. What should I watch for while using this medication? Your condition will be monitored carefully while you are receiving this medication. You may need blood work while taking this medication. This medication may make you feel generally unwell. This is not uncommon as chemotherapy can affect healthy cells as well as cancer cells. Report any side effects. Continue your course of treatment even though you feel ill unless your care team tells you to stop. This medication may increase your risk of getting an infection. Call your  care team for advice if you get a fever, chills, sore throat, or other symptoms of a cold or flu. Do not treat yourself. Try to avoid being around people who are sick. Avoid taking medications that contain aspirin, acetaminophen , ibuprofen, naproxen, or ketoprofen unless instructed by your care team. These medications may hide a fever. Be careful brushing or flossing your teeth or using a toothpick because you may get an infection or bleed more easily. If you have any dental work done, tell your dentist you are receiving this medication. This medication can make you more sensitive to  cold. Do not drink cold drinks or use ice. Cover exposed skin before coming in contact with cold temperatures or cold objects. When out in cold weather wear warm clothing and cover your mouth and nose to warm the air that goes into your lungs. Tell your care team if you get sensitive to the cold. Talk to your care team if you or your partner are pregnant or think either of you might be pregnant. This medication can cause serious birth defects if taken during pregnancy and for 9 months after the last dose. A negative pregnancy test is required before starting this medication. A reliable form of contraception is recommended while taking this medication and for 9 months after the last dose. Talk to your care team about effective forms of contraception. Do not father a child while taking this medication and for 6 months after the last dose. Use a condom while having sex during this time period. Do not breastfeed while taking this medication and for 3 months after the last dose. This medication may cause infertility. Talk to your care team if you are concerned about your fertility. What side effects may I notice from receiving this medication? Side effects that you should report to your care team as soon as possible: Allergic reactions--skin rash, itching, hives, swelling of the face, lips, tongue, or throat Bleeding--bloody or black, tar-like stools, vomiting blood or brown material that looks like coffee grounds, red or dark brown urine, small red or purple spots on skin, unusual bruising or bleeding Dry cough, shortness of breath or trouble breathing Heart rhythm changes--fast or irregular heartbeat, dizziness, feeling faint or lightheaded, chest pain, trouble breathing Infection--fever, chills, cough, sore throat, wounds that don't heal, pain or trouble when passing urine, general feeling of discomfort or being unwell Liver injury--right upper belly pain, loss of appetite, nausea, light-colored stool, dark  yellow or brown urine, yellowing skin or eyes, unusual weakness or fatigue Low red blood cell level--unusual weakness or fatigue, dizziness, headache, trouble breathing Muscle injury--unusual weakness or fatigue, muscle pain, dark yellow or brown urine, decrease in amount of urine Pain, tingling, or numbness in the hands or feet Sudden and severe headache, confusion, change in vision, seizures, which may be signs of posterior reversible encephalopathy syndrome (PRES) Unusual bruising or bleeding Side effects that usually do not require medical attention (report to your care team if they continue or are bothersome): Diarrhea Nausea Pain, redness, or swelling with sores inside the mouth or throat Unusual weakness or fatigue Vomiting This list may not describe all possible side effects. Call your doctor for medical advice about side effects. You may report side effects to FDA at 1-800-FDA-1088. Where should I keep my medication? This medication is given in a hospital or clinic. It will not be stored at home. NOTE: This sheet is a summary. It may not cover all possible information. If you have questions about this medicine,  talk to your doctor, pharmacist, or health care provider.  2024 Elsevier/Gold Standard (2022-01-15 00:00:00) Leucovorin  Injection What is this medication? LEUCOVORIN  (loo koe VOR in) prevents side effects from certain medications, such as methotrexate. It works by increasing folate levels. This helps protect healthy cells in your body. It may also be used to treat anemia caused by low levels of folate. It can also be used with fluorouracil , a type of chemotherapy, to treat colorectal cancer. It works by increasing the effects of fluorouracil  in the body. This medicine may be used for other purposes; ask your health care provider or pharmacist if you have questions. What should I tell my care team before I take this medication? They need to know if you have any of these  conditions: Anemia from low levels of vitamin B12 in the blood An unusual or allergic reaction to leucovorin , folic acid, other medications, foods, dyes, or preservatives Pregnant or trying to get pregnant Breastfeeding How should I use this medication? This medication is injected into a vein or a muscle. It is given by your care team in a hospital or clinic setting. Talk to your care team about the use of this medication in children. Special care may be needed. Overdosage: If you think you have taken too much of this medicine contact a poison control center or emergency room at once. NOTE: This medicine is only for you. Do not share this medicine with others. What if I miss a dose? Keep appointments for follow-up doses. It is important not to miss your dose. Call your care team if you are unable to keep an appointment. What may interact with this medication? Capecitabine Fluorouracil  Phenobarbital Phenytoin Primidone Trimethoprim;sulfamethoxazole This list may not describe all possible interactions. Give your health care provider a list of all the medicines, herbs, non-prescription drugs, or dietary supplements you use. Also tell them if you smoke, drink alcohol, or use illegal drugs. Some items may interact with your medicine. What should I watch for while using this medication? Your condition will be monitored carefully while you are receiving this medication. This medication may increase the side effects of 5-fluorouracil . Tell your care team if you have diarrhea or mouth sores that do not get better or that get worse. What side effects may I notice from receiving this medication? Side effects that you should report to your care team as soon as possible: Allergic reactions--skin rash, itching, hives, swelling of the face, lips, tongue, or throat This list may not describe all possible side effects. Call your doctor for medical advice about side effects. You may report side effects to  FDA at 1-800-FDA-1088. Where should I keep my medication? This medication is given in a hospital or clinic. It will not be stored at home. NOTE: This sheet is a summary. It may not cover all possible information. If you have questions about this medicine, talk to your doctor, pharmacist, or health care provider.  2024 Elsevier/Gold Standard (2021-09-04 00:00:00) Irinotecan  Injection What is this medication? IRINOTECAN  (ir in oh TEE kan) treats some types of cancer. It works by slowing down the growth of cancer cells. This medicine may be used for other purposes; ask your health care provider or pharmacist if you have questions. COMMON BRAND NAME(S): Camptosar  What should I tell my care team before I take this medication? They need to know if you have any of these conditions: Dehydration Diarrhea Infection, especially a viral infection, such as chickenpox, cold sores, herpes Liver disease Low blood cell levels (  white cells, red cells, and platelets) Low levels of electrolytes, such as calcium , magnesium , or potassium in your blood Recent or ongoing radiation An unusual or allergic reaction to irinotecan , other medications, foods, dyes, or preservatives If you or your partner are pregnant or trying to get pregnant Breast-feeding How should I use this medication? This medication is injected into a vein. It is given by your care team in a hospital or clinic setting. Talk to your care team about the use of this medication in children. Special care may be needed. Overdosage: If you think you have taken too much of this medicine contact a poison control center or emergency room at once. NOTE: This medicine is only for you. Do not share this medicine with others. What if I miss a dose? Keep appointments for follow-up doses. It is important not to miss your dose. Call your care team if you are unable to keep an appointment. What may interact with this medication? Do not take this medication  with any of the following: Cobicistat Itraconazole This medication may also interact with the following: Certain antibiotics, such as clarithromycin, rifampin, rifabutin Certain antivirals for HIV or AIDS Certain medications for fungal infections, such as ketoconazole, posaconazole, voriconazole Certain medications for seizures, such as carbamazepine, phenobarbital, phenytoin Gemfibrozil Nefazodone St. John's wort This list may not describe all possible interactions. Give your health care provider a list of all the medicines, herbs, non-prescription drugs, or dietary supplements you use. Also tell them if you smoke, drink alcohol, or use illegal drugs. Some items may interact with your medicine. What should I watch for while using this medication? Your condition will be monitored carefully while you are receiving this medication. You may need blood work while taking this medication. This medication may make you feel generally unwell. This is not uncommon as chemotherapy can affect healthy cells as well as cancer cells. Report any side effects. Continue your course of treatment even though you feel ill unless your care team tells you to stop. This medication can cause serious side effects. To reduce the risk, your care team may give you other medications to take before receiving this one. Be sure to follow the directions from your care team. This medication may affect your coordination, reaction time, or judgement. Do not drive or operate machinery until you know how this medication affects you. Sit up or stand slowly to reduce the risk of dizzy or fainting spells. Drinking alcohol with this medication can increase the risk of these side effects. This medication may increase your risk of getting an infection. Call your care team for advice if you get a fever, chills, sore throat, or other symptoms of a cold or flu. Do not treat yourself. Try to avoid being around people who are sick. Avoid taking  medications that contain aspirin, acetaminophen , ibuprofen, naproxen, or ketoprofen unless instructed by your care team. These medications may hide a fever. This medication may increase your risk to bruise or bleed. Call your care team if you notice any unusual bleeding. Be careful brushing or flossing your teeth or using a toothpick because you may get an infection or bleed more easily. If you have any dental work done, tell your dentist you are receiving this medication. Talk to your care team if you or your partner are pregnant or think either of you might be pregnant. This medication can cause serious birth defects if taken during pregnancy and for 6 months after the last dose. You will need a negative  pregnancy test before starting this medication. Contraception is recommended while taking this medication and for 6 months after the last dose. Your care team can help you find the option that works for you. Do not father a child while taking this medication and for 3 months after the last dose. Use a condom for contraception during this time period. Do not breastfeed while taking this medication and for 7 days after the last dose. This medication may cause infertility. Talk to your care team if you are concerned about your fertility. What side effects may I notice from receiving this medication? Side effects that you should report to your care team as soon as possible: Allergic reactions--skin rash, itching, hives, swelling of the face, lips, tongue, or throat Dry cough, shortness of breath or trouble breathing Increased saliva or tears, increased sweating, stomach cramping, diarrhea, small pupils, unusual weakness or fatigue, slow heartbeat Infection--fever, chills, cough, sore throat, wounds that don't heal, pain or trouble when passing urine, general feeling of discomfort or being unwell Kidney injury--decrease in the amount of urine, swelling of the ankles, hands, or feet Low red blood cell  level--unusual weakness or fatigue, dizziness, headache, trouble breathing Severe or prolonged diarrhea Unusual bruising or bleeding Side effects that usually do not require medical attention (report to your care team if they continue or are bothersome): Constipation Diarrhea Hair loss Loss of appetite Nausea Stomach pain This list may not describe all possible side effects. Call your doctor for medical advice about side effects. You may report side effects to FDA at 1-800-FDA-1088. Where should I keep my medication? This medication is given in a hospital or clinic. It will not be stored at home. NOTE: This sheet is a summary. It may not cover all possible information. If you have questions about this medicine, talk to your doctor, pharmacist, or health care provider.  2024 Elsevier/Gold Standard (2021-08-13 00:00:00) Fluorouracil  Injection What is this medication? FLUOROURACIL  (flure oh YOOR a sil) treats some types of cancer. It works by slowing down the growth of cancer cells. This medicine may be used for other purposes; ask your health care provider or pharmacist if you have questions. COMMON BRAND NAME(S): Adrucil  What should I tell my care team before I take this medication? They need to know if you have any of these conditions: Blood disorders Dihydropyrimidine dehydrogenase (DPD) deficiency Infection, such as chickenpox, cold sores, herpes Kidney disease Liver disease Poor nutrition Recent or ongoing radiation therapy An unusual or allergic reaction to fluorouracil , other medications, foods, dyes, or preservatives If you or your partner are pregnant or trying to get pregnant Breast-feeding How should I use this medication? This medication is injected into a vein. It is administered by your care team in a hospital or clinic setting. Talk to your care team about the use of this medication in children. Special care may be needed. Overdosage: If you think you have taken too  much of this medicine contact a poison control center or emergency room at once. NOTE: This medicine is only for you. Do not share this medicine with others. What if I miss a dose? Keep appointments for follow-up doses. It is important not to miss your dose. Call your care team if you are unable to keep an appointment. What may interact with this medication? Do not take this medication with any of the following: Live virus vaccines This medication may also interact with the following: Medications that treat or prevent blood clots, such as warfarin, enoxaparin, dalteparin This  list may not describe all possible interactions. Give your health care provider a list of all the medicines, herbs, non-prescription drugs, or dietary supplements you use. Also tell them if you smoke, drink alcohol, or use illegal drugs. Some items may interact with your medicine. What should I watch for while using this medication? Your condition will be monitored carefully while you are receiving this medication. This medication may make you feel generally unwell. This is not uncommon as chemotherapy can affect healthy cells as well as cancer cells. Report any side effects. Continue your course of treatment even though you feel ill unless your care team tells you to stop. In some cases, you may be given additional medications to help with side effects. Follow all directions for their use. This medication may increase your risk of getting an infection. Call your care team for advice if you get a fever, chills, sore throat, or other symptoms of a cold or flu. Do not treat yourself. Try to avoid being around people who are sick. This medication may increase your risk to bruise or bleed. Call your care team if you notice any unusual bleeding. Be careful brushing or flossing your teeth or using a toothpick because you may get an infection or bleed more easily. If you have any dental work done, tell your dentist you are receiving  this medication. Avoid taking medications that contain aspirin, acetaminophen , ibuprofen, naproxen, or ketoprofen unless instructed by your care team. These medications may hide a fever. Do not treat diarrhea with over the counter products. Contact your care team if you have diarrhea that lasts more than 2 days or if it is severe and watery. This medication can make you more sensitive to the sun. Keep out of the sun. If you cannot avoid being in the sun, wear protective clothing and sunscreen. Do not use sun lamps, tanning beds, or tanning booths. Talk to your care team if you or your partner wish to become pregnant or think you might be pregnant. This medication can cause serious birth defects if taken during pregnancy and for 3 months after the last dose. A reliable form of contraception is recommended while taking this medication and for 3 months after the last dose. Talk to your care team about effective forms of contraception. Do not father a child while taking this medication and for 3 months after the last dose. Use a condom while having sex during this time period. Do not breastfeed while taking this medication. This medication may cause infertility. Talk to your care team if you are concerned about your fertility. What side effects may I notice from receiving this medication? Side effects that you should report to your care team as soon as possible: Allergic reactions--skin rash, itching, hives, swelling of the face, lips, tongue, or throat Heart attack--pain or tightness in the chest, shoulders, arms, or jaw, nausea, shortness of breath, cold or clammy skin, feeling faint or lightheaded Heart failure--shortness of breath, swelling of the ankles, feet, or hands, sudden weight gain, unusual weakness or fatigue Heart rhythm changes--fast or irregular heartbeat, dizziness, feeling faint or lightheaded, chest pain, trouble breathing High ammonia level--unusual weakness or fatigue, confusion, loss  of appetite, nausea, vomiting, seizures Infection--fever, chills, cough, sore throat, wounds that don't heal, pain or trouble when passing urine, general feeling of discomfort or being unwell Low red blood cell level--unusual weakness or fatigue, dizziness, headache, trouble breathing Pain, tingling, or numbness in the hands or feet, muscle weakness, change in vision, confusion or  trouble speaking, loss of balance or coordination, trouble walking, seizures Redness, swelling, and blistering of the skin over hands and feet Severe or prolonged diarrhea Unusual bruising or bleeding Side effects that usually do not require medical attention (report to your care team if they continue or are bothersome): Dry skin Headache Increased tears Nausea Pain, redness, or swelling with sores inside the mouth or throat Sensitivity to light Vomiting This list may not describe all possible side effects. Call your doctor for medical advice about side effects. You may report side effects to FDA at 1-800-FDA-1088. Where should I keep my medication? This medication is given in a hospital or clinic. It will not be stored at home. NOTE: This sheet is a summary. It may not cover all possible information. If you have questions about this medicine, talk to your doctor, pharmacist, or health care provider.  2024 [employer]/Gold Standard (2021-08-07 00:00:00)

## 2023-04-17 ENCOUNTER — Inpatient Hospital Stay: Payer: BC Managed Care – PPO | Attending: Oncology

## 2023-04-17 ENCOUNTER — Encounter: Payer: BC Managed Care – PPO | Admitting: Genetic Counselor

## 2023-04-17 VITALS — BP 128/89 | HR 64 | Temp 98.1°F | Resp 18

## 2023-04-17 DIAGNOSIS — Q2381 Bicuspid aortic valve: Secondary | ICD-10-CM | POA: Diagnosis not present

## 2023-04-17 DIAGNOSIS — Z8719 Personal history of other diseases of the digestive system: Secondary | ICD-10-CM | POA: Insufficient documentation

## 2023-04-17 DIAGNOSIS — D696 Thrombocytopenia, unspecified: Secondary | ICD-10-CM | POA: Diagnosis not present

## 2023-04-17 DIAGNOSIS — Z5111 Encounter for antineoplastic chemotherapy: Secondary | ICD-10-CM | POA: Diagnosis not present

## 2023-04-17 DIAGNOSIS — Z5189 Encounter for other specified aftercare: Secondary | ICD-10-CM | POA: Insufficient documentation

## 2023-04-17 DIAGNOSIS — I10 Essential (primary) hypertension: Secondary | ICD-10-CM | POA: Insufficient documentation

## 2023-04-17 DIAGNOSIS — C250 Malignant neoplasm of head of pancreas: Secondary | ICD-10-CM | POA: Diagnosis not present

## 2023-04-17 DIAGNOSIS — Z452 Encounter for adjustment and management of vascular access device: Secondary | ICD-10-CM | POA: Diagnosis not present

## 2023-04-17 DIAGNOSIS — R739 Hyperglycemia, unspecified: Secondary | ICD-10-CM | POA: Insufficient documentation

## 2023-04-17 DIAGNOSIS — E876 Hypokalemia: Secondary | ICD-10-CM | POA: Insufficient documentation

## 2023-04-17 MED ORDER — SODIUM CHLORIDE 0.9% FLUSH
10.0000 mL | INTRAVENOUS | Status: DC | PRN
  Administered 2023-04-17: 10 mL

## 2023-04-17 MED ORDER — PEGFILGRASTIM-JMDB 6 MG/0.6ML ~~LOC~~ SOSY
6.0000 mg | PREFILLED_SYRINGE | Freq: Once | SUBCUTANEOUS | Status: AC
  Administered 2023-04-17: 6 mg via SUBCUTANEOUS
  Filled 2023-04-17: qty 0.6

## 2023-04-17 MED ORDER — HEPARIN SOD (PORK) LOCK FLUSH 100 UNIT/ML IV SOLN
500.0000 [IU] | Freq: Once | INTRAVENOUS | Status: AC | PRN
  Administered 2023-04-17: 500 [IU]

## 2023-04-17 NOTE — Patient Instructions (Signed)

## 2023-04-18 ENCOUNTER — Ambulatory Visit: Payer: BC Managed Care – PPO | Admitting: Gastroenterology

## 2023-04-21 ENCOUNTER — Telehealth: Payer: Self-pay | Admitting: Genetic Counselor

## 2023-04-21 ENCOUNTER — Encounter: Payer: Self-pay | Admitting: Genetic Counselor

## 2023-04-21 DIAGNOSIS — C250 Malignant neoplasm of head of pancreas: Secondary | ICD-10-CM | POA: Diagnosis not present

## 2023-04-21 NOTE — Telephone Encounter (Signed)
 Randy Allen had a genetic counseling appointment scheduled for 04/17/2023 to review his genetic test result, but he canceled his appointment. Therefore, I called him to see if he would like to reschedule and he declined. He requested we review the results now via telephone. Of note, the test report has been scanned into EPIC and is located under the Molecular Pathology section of the Results Review tab. We reviewed the following information:  Genetic Test Results:  The [facility]-Expanded Panel found no pathogenic mutations.       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  At this time, it is unknown if the variants are associated with an increased risk for cancer or if they are benign, but most uncertain variants are reclassified to benign. It should not be used to make medical management decisions. With time, we suspect the laboratory will determine the significance of the variants, if any. If the laboratory reclassifies the variants, we will attempt to contact Randy Allen to discuss it further. We do not recommend familial testing for the VUSs.  Since he did not inherit a mutation in a cancer predisposition gene included on this panel, if he has biological children, they could not have inherited a mutation from him in one of these genes.  Even though a pathogenic variant was not identified, possible explanations for his personal history of cancer may include: There may be no hereditary risk for cancer in the family. His personal history of cancer may be due to other genetic or environmental factors. There may be a gene mutation in one of these genes that current testing methods cannot detect, but that chance is small. There could be another gene that has not yet been discovered, or that we have not yet tested, that is responsible for the cancer diagnoses in the family.   Randy Allen questions were answered to his satisfaction, and he knows he is  welcome to call us  at anytime with additional questions or concerns.   [name], MS, [facility] Genetic Counselor [email or web address] (P) [phone]

## 2023-04-26 ENCOUNTER — Other Ambulatory Visit: Payer: Self-pay | Admitting: Oncology

## 2023-04-29 ENCOUNTER — Encounter: Payer: Self-pay | Admitting: Nurse Practitioner

## 2023-04-29 ENCOUNTER — Inpatient Hospital Stay: Payer: BC Managed Care – PPO

## 2023-04-29 ENCOUNTER — Inpatient Hospital Stay: Payer: BC Managed Care – PPO | Admitting: Nurse Practitioner

## 2023-04-29 VITALS — BP 125/85 | HR 61 | Temp 98.2°F | Resp 18 | Ht 68.0 in | Wt 145.6 lb

## 2023-04-29 DIAGNOSIS — C250 Malignant neoplasm of head of pancreas: Secondary | ICD-10-CM | POA: Diagnosis not present

## 2023-04-29 DIAGNOSIS — Q2381 Bicuspid aortic valve: Secondary | ICD-10-CM | POA: Diagnosis not present

## 2023-04-29 DIAGNOSIS — Z452 Encounter for adjustment and management of vascular access device: Secondary | ICD-10-CM | POA: Diagnosis not present

## 2023-04-29 DIAGNOSIS — E876 Hypokalemia: Secondary | ICD-10-CM

## 2023-04-29 DIAGNOSIS — D696 Thrombocytopenia, unspecified: Secondary | ICD-10-CM | POA: Diagnosis not present

## 2023-04-29 DIAGNOSIS — Z8719 Personal history of other diseases of the digestive system: Secondary | ICD-10-CM | POA: Diagnosis not present

## 2023-04-29 DIAGNOSIS — I10 Essential (primary) hypertension: Secondary | ICD-10-CM | POA: Diagnosis not present

## 2023-04-29 DIAGNOSIS — R739 Hyperglycemia, unspecified: Secondary | ICD-10-CM | POA: Diagnosis not present

## 2023-04-29 DIAGNOSIS — Z5189 Encounter for other specified aftercare: Secondary | ICD-10-CM | POA: Diagnosis not present

## 2023-04-29 DIAGNOSIS — Z5111 Encounter for antineoplastic chemotherapy: Secondary | ICD-10-CM | POA: Diagnosis not present

## 2023-04-29 LAB — CBC WITH DIFFERENTIAL (CANCER CENTER ONLY)
Abs Immature Granulocytes: 1.53 10*3/uL — ABNORMAL HIGH (ref 0.00–0.07)
Basophils Absolute: 0.1 10*3/uL (ref 0.0–0.1)
Basophils Relative: 1 %
Eosinophils Absolute: 0.1 10*3/uL (ref 0.0–0.5)
Eosinophils Relative: 1 %
HCT: 33.7 % — ABNORMAL LOW (ref 39.0–52.0)
Hemoglobin: 11.4 g/dL — ABNORMAL LOW (ref 13.0–17.0)
Immature Granulocytes: 9 %
Lymphocytes Relative: 14 %
Lymphs Abs: 2.4 10*3/uL (ref 0.7–4.0)
MCH: 30.6 pg (ref 26.0–34.0)
MCHC: 33.8 g/dL (ref 30.0–36.0)
MCV: 90.6 fL (ref 80.0–100.0)
Monocytes Absolute: 1 10*3/uL (ref 0.1–1.0)
Monocytes Relative: 6 %
Neutro Abs: 12 10*3/uL — ABNORMAL HIGH (ref 1.7–7.7)
Neutrophils Relative %: 69 %
Platelet Count: 108 10*3/uL — ABNORMAL LOW (ref 150–400)
RBC: 3.72 MIL/uL — ABNORMAL LOW (ref 4.22–5.81)
RDW: 18.7 % — ABNORMAL HIGH (ref 11.5–15.5)
Smear Review: ADEQUATE
WBC Count: 17.2 10*3/uL — ABNORMAL HIGH (ref 4.0–10.5)
nRBC: 0.3 % — ABNORMAL HIGH (ref 0.0–0.2)

## 2023-04-29 LAB — CMP (CANCER CENTER ONLY)
ALT: 35 U/L (ref 0–44)
AST: 34 U/L (ref 15–41)
Albumin: 3.8 g/dL (ref 3.5–5.0)
Alkaline Phosphatase: 302 U/L — ABNORMAL HIGH (ref 38–126)
Anion gap: 6 (ref 5–15)
BUN: 15 mg/dL (ref 8–23)
CO2: 28 mmol/L (ref 22–32)
Calcium: 9.4 mg/dL (ref 8.9–10.3)
Chloride: 104 mmol/L (ref 98–111)
Creatinine: 1 mg/dL (ref 0.61–1.24)
GFR, Estimated: >60 mL/min (ref >60)
Glucose, Bld: 92 mg/dL (ref 70–99)
Potassium: 3.3 mmol/L — ABNORMAL LOW (ref 3.5–5.1)
Sodium: 138 mmol/L (ref 135–145)
Total Bilirubin: 0.6 mg/dL (ref 0.0–1.2)
Total Protein: 6.3 g/dL — ABNORMAL LOW (ref 6.5–8.1)

## 2023-04-29 MED ORDER — PALONOSETRON HCL INJECTION 0.25 MG/5ML
0.2500 mg | Freq: Once | INTRAVENOUS | Status: AC
Start: 2023-04-29 — End: 2023-04-29
  Administered 2023-04-29: 0.25 mg via INTRAVENOUS
  Filled 2023-04-29: qty 5

## 2023-04-29 MED ORDER — SODIUM CHLORIDE 0.9 % IV SOLN
400.0000 mg/m2 | Freq: Once | INTRAVENOUS | Status: AC
  Administered 2023-04-29: 724 mg via INTRAVENOUS
  Filled 2023-04-29: qty 36.2

## 2023-04-29 MED ORDER — DEXTROSE 5 % IV SOLN: INTRAVENOUS | Status: DC

## 2023-04-29 MED ORDER — FOSAPREPITANT DIMEGLUMINE INJECTION 150 MG
150.0000 mg | Freq: Once | INTRAVENOUS | Status: AC
  Administered 2023-04-29: 150 mg via INTRAVENOUS
  Filled 2023-04-29: qty 150

## 2023-04-29 MED ORDER — IRINOTECAN HCL CHEMO INJECTION 100 MG/5ML
150.0000 mg/m2 | Freq: Once | INTRAVENOUS | Status: AC
  Administered 2023-04-29: 300 mg via INTRAVENOUS
  Filled 2023-04-29: qty 15

## 2023-04-29 MED ORDER — OXALIPLATIN CHEMO INJECTION 100 MG/20ML
85.0000 mg/m2 | Freq: Once | INTRAVENOUS | Status: AC
  Administered 2023-04-29: 150 mg via INTRAVENOUS
  Filled 2023-04-29: qty 20

## 2023-04-29 MED ORDER — POTASSIUM CHLORIDE CRYS ER 10 MEQ PO TBCR: 10.0000 meq | EXTENDED_RELEASE_TABLET | Freq: Every day | ORAL | 3 refills | Status: DC

## 2023-04-29 MED ORDER — SODIUM CHLORIDE 0.9 % IV SOLN
2400.0000 mg/m2 | INTRAVENOUS | Status: DC
  Administered 2023-04-29: 4350 mg via INTRAVENOUS
  Filled 2023-04-29: qty 87

## 2023-04-29 MED ORDER — DEXAMETHASONE SODIUM PHOSPHATE 10 MG/ML IJ SOLN
10.0000 mg | Freq: Once | INTRAMUSCULAR | Status: AC
  Administered 2023-04-29: 10 mg via INTRAVENOUS
  Filled 2023-04-29: qty 1

## 2023-04-29 MED ORDER — SODIUM CHLORIDE 0.9% FLUSH
10.0000 mL | INTRAVENOUS | Status: DC | PRN
Start: 2023-04-29 — End: 2023-04-29
  Administered 2023-04-29: 10 mL

## 2023-04-29 MED ORDER — ATROPINE SULFATE 1 MG/ML IV SOLN
0.5000 mg | Freq: Once | INTRAVENOUS | Status: AC | PRN
  Administered 2023-04-29: 0.5 mg via INTRAVENOUS
  Filled 2023-04-29: qty 1

## 2023-04-29 NOTE — Patient Instructions (Addendum)
 [facility] - A DEPT OF [place]   The chemotherapy medication bag should finish at 46 hours, 96 hours, or 7 days. For example, if your pump is scheduled for 46 hours and it was put on at 4:00 p.m., it should finish at 2:00 p.m. the day it is scheduled to come off regardless of your appointment time.     Estimated time to finish at 1:30pm on Thursday May 01, 2023.   If the display on your pump reads Low Volume and it is beeping, take the batteries out of the pump and come to the cancer center for it to be taken off.   If the pump alarms go off prior to the pump reading Low Volume then call [phone] and someone can assist you.  If the plunger comes out and the chemotherapy medication is leaking out, please use your home chemo spill kit to clean up the spill. Do NOT use paper towels or other household products.  If you have problems or questions regarding your pump, please call either [phone] (24 hours a day) or the cancer center Monday-Friday 8:00 a.m.- 4:30 p.m. at the clinic number and we will assist you. If you are unable to get assistance, then go to the nearest Emergency Department and ask the staff to contact the IV team for assistance.  Discharge Instructions: Thank you for choosing [facility] to provide your oncology and hematology care.   If you have a lab appointment with the Cancer Center, please go directly to the Cancer Center and check in at the registration area.   Wear comfortable clothing and clothing appropriate for easy access to any Portacath or PICC line.   We strive to give you quality time with your provider. You may need to reschedule your appointment if you arrive late (15 or more minutes).  Arriving late affects you and other patients whose appointments are after yours.  Also, if you miss three or more appointments without notifying the office, you may be dismissed from the clinic at the provider's  discretion.      For prescription refill requests, have your pharmacy contact our office and allow 72 hours for refills to be completed.    Today you received the following chemotherapy and/or immunotherapy agents Oxaliplatin , Leucovorin , Irinotecan , Fluorouracil       To help prevent nausea and vomiting after your treatment, we encourage you to take your nausea medication as directed.  BELOW ARE SYMPTOMS THAT SHOULD BE REPORTED IMMEDIATELY: *FEVER GREATER THAN 100.4 F (38 C) OR HIGHER *CHILLS OR SWEATING *NAUSEA AND VOMITING THAT IS NOT CONTROLLED WITH YOUR NAUSEA MEDICATION *UNUSUAL SHORTNESS OF BREATH *UNUSUAL BRUISING OR BLEEDING *URINARY PROBLEMS (pain or burning when urinating, or frequent urination) *BOWEL PROBLEMS (unusual diarrhea, constipation, pain near the anus) TENDERNESS IN MOUTH AND THROAT WITH OR WITHOUT PRESENCE OF ULCERS (sore throat, sores in mouth, or a toothache) UNUSUAL RASH, SWELLING OR PAIN  UNUSUAL VAGINAL DISCHARGE OR ITCHING   Items with * indicate a potential emergency and should be followed up as soon as possible or go to the Emergency Department if any problems should occur.  Please show the CHEMOTHERAPY ALERT CARD or IMMUNOTHERAPY ALERT CARD at check-in to the Emergency Department and triage nurse.  Should you have questions after your visit or need to cancel or reschedule your appointment, please contact [facility] - A DEPT OF [facility]  Dept: [phone]  and follow the prompts.  Office hours are 8:00 a.m. to 4:30 p.m. Monday - Friday. Please note that voicemails left after 4:00 p.m. may not be returned until the following business day.  We are closed weekends and major holidays. You have access to a nurse at all times for urgent questions. Please call the main number to the clinic Dept: [phone] and follow the prompts.   For any non-urgent questions, you may also contact your provider using MyChart. We now offer  e-Visits for anyone 60 and older to request care online for non-urgent symptoms. For details visit [email or web address].   Also download the MyChart app! Go to the app store, search MyChart, open the app, select [facility], and log in with your MyChart username and password.  Oxaliplatin  Injection What is this medication? OXALIPLATIN  (ox AL i PLA tin) treats colorectal cancer. It works by slowing down the growth of cancer cells. This medicine may be used for other purposes; ask your health care provider or pharmacist if you have questions. COMMON BRAND NAME(S): Eloxatin  What should I tell my care team before I take this medication? They need to know if you have any of these conditions: Heart disease History of irregular heartbeat or rhythm Liver disease Low blood cell levels (white cells, red cells, and platelets) Lung or breathing disease, such as asthma Take medications that treat or prevent blood clots Tingling of the fingers, toes, or other nerve disorder An unusual or allergic reaction to oxaliplatin , other medications, foods, dyes, or preservatives If you or your partner are pregnant or trying to get pregnant Breast-feeding How should I use this medication? This medication is injected into a vein. It is given by your care team in a hospital or clinic setting. Talk to your care team about the use of this medication in children. Special care may be needed. Overdosage: If you think you have taken too much of this medicine contact a poison control center or emergency room at once. NOTE: This medicine is only for you. Do not share this medicine with others. What if I miss a dose? Keep appointments for follow-up doses. It is important not to miss a dose. Call your care team if you are unable to keep an appointment. What may interact with this medication? Do not take this medication with any of the following: Cisapride Dronedarone Pimozide Thioridazine This medication may also  interact with the following: Aspirin and aspirin-like medications Certain medications that treat or prevent blood clots, such as warfarin, apixaban, dabigatran, and rivaroxaban Cisplatin Cyclosporine Diuretics Medications for infection, such as acyclovir, adefovir, amphotericin B, bacitracin, cidofovir, foscarnet, ganciclovir, gentamicin, pentamidine, vancomycin  NSAIDs, medications for pain and inflammation, such as ibuprofen or naproxen Other medications that cause heart rhythm changes Pamidronate Zoledronic acid This list may not describe all possible interactions. Give your health care provider a list of all the medicines, herbs, non-prescription drugs, or dietary supplements you use. Also tell them if you smoke, drink alcohol, or use illegal drugs. Some items may interact with your medicine. What should I watch for while using this medication? Your condition will be monitored carefully while you are receiving this medication. You may need blood work while taking this medication. This medication may make you feel generally unwell. This is not uncommon as chemotherapy can affect healthy cells as well as cancer cells. Report any side effects. Continue your course of treatment even though you feel ill unless your care team tells you to stop. This medication may increase your risk of getting an infection. Call your  care team for advice if you get a fever, chills, sore throat, or other symptoms of a cold or flu. Do not treat yourself. Try to avoid being around people who are sick. Avoid taking medications that contain aspirin, acetaminophen , ibuprofen, naproxen, or ketoprofen unless instructed by your care team. These medications may hide a fever. Be careful brushing or flossing your teeth or using a toothpick because you may get an infection or bleed more easily. If you have any dental work done, tell your dentist you are receiving this medication. This medication can make you more sensitive to  cold. Do not drink cold drinks or use ice. Cover exposed skin before coming in contact with cold temperatures or cold objects. When out in cold weather wear warm clothing and cover your mouth and nose to warm the air that goes into your lungs. Tell your care team if you get sensitive to the cold. Talk to your care team if you or your partner are pregnant or think either of you might be pregnant. This medication can cause serious birth defects if taken during pregnancy and for 9 months after the last dose. A negative pregnancy test is required before starting this medication. A reliable form of contraception is recommended while taking this medication and for 9 months after the last dose. Talk to your care team about effective forms of contraception. Do not father a child while taking this medication and for 6 months after the last dose. Use a condom while having sex during this time period. Do not breastfeed while taking this medication and for 3 months after the last dose. This medication may cause infertility. Talk to your care team if you are concerned about your fertility. What side effects may I notice from receiving this medication? Side effects that you should report to your care team as soon as possible: Allergic reactions--skin rash, itching, hives, swelling of the face, lips, tongue, or throat Bleeding--bloody or black, tar-like stools, vomiting blood or brown material that looks like coffee grounds, red or dark brown urine, small red or purple spots on skin, unusual bruising or bleeding Dry cough, shortness of breath or trouble breathing Heart rhythm changes--fast or irregular heartbeat, dizziness, feeling faint or lightheaded, chest pain, trouble breathing Infection--fever, chills, cough, sore throat, wounds that don't heal, pain or trouble when passing urine, general feeling of discomfort or being unwell Liver injury--right upper belly pain, loss of appetite, nausea, light-colored stool, dark  yellow or brown urine, yellowing skin or eyes, unusual weakness or fatigue Low red blood cell level--unusual weakness or fatigue, dizziness, headache, trouble breathing Muscle injury--unusual weakness or fatigue, muscle pain, dark yellow or brown urine, decrease in amount of urine Pain, tingling, or numbness in the hands or feet Sudden and severe headache, confusion, change in vision, seizures, which may be signs of posterior reversible encephalopathy syndrome (PRES) Unusual bruising or bleeding Side effects that usually do not require medical attention (report to your care team if they continue or are bothersome): Diarrhea Nausea Pain, redness, or swelling with sores inside the mouth or throat Unusual weakness or fatigue Vomiting This list may not describe all possible side effects. Call your doctor for medical advice about side effects. You may report side effects to FDA at 1-800-FDA-1088. Where should I keep my medication? This medication is given in a hospital or clinic. It will not be stored at home. NOTE: This sheet is a summary. It may not cover all possible information. If you have questions about this medicine,  talk to your doctor, pharmacist, or health care provider.  2024 Elsevier/Gold Standard (2022-01-15 00:00:00) Leucovorin  Injection What is this medication? LEUCOVORIN  (loo koe VOR in) prevents side effects from certain medications, such as methotrexate. It works by increasing folate levels. This helps protect healthy cells in your body. It may also be used to treat anemia caused by low levels of folate. It can also be used with fluorouracil , a type of chemotherapy, to treat colorectal cancer. It works by increasing the effects of fluorouracil  in the body. This medicine may be used for other purposes; ask your health care provider or pharmacist if you have questions. What should I tell my care team before I take this medication? They need to know if you have any of these  conditions: Anemia from low levels of vitamin B12 in the blood An unusual or allergic reaction to leucovorin , folic acid, other medications, foods, dyes, or preservatives Pregnant or trying to get pregnant Breastfeeding How should I use this medication? This medication is injected into a vein or a muscle. It is given by your care team in a hospital or clinic setting. Talk to your care team about the use of this medication in children. Special care may be needed. Overdosage: If you think you have taken too much of this medicine contact a poison control center or emergency room at once. NOTE: This medicine is only for you. Do not share this medicine with others. What if I miss a dose? Keep appointments for follow-up doses. It is important not to miss your dose. Call your care team if you are unable to keep an appointment. What may interact with this medication? Capecitabine Fluorouracil  Phenobarbital Phenytoin Primidone Trimethoprim;sulfamethoxazole This list may not describe all possible interactions. Give your health care provider a list of all the medicines, herbs, non-prescription drugs, or dietary supplements you use. Also tell them if you smoke, drink alcohol, or use illegal drugs. Some items may interact with your medicine. What should I watch for while using this medication? Your condition will be monitored carefully while you are receiving this medication. This medication may increase the side effects of 5-fluorouracil . Tell your care team if you have diarrhea or mouth sores that do not get better or that get worse. What side effects may I notice from receiving this medication? Side effects that you should report to your care team as soon as possible: Allergic reactions--skin rash, itching, hives, swelling of the face, lips, tongue, or throat This list may not describe all possible side effects. Call your doctor for medical advice about side effects. You may report side effects to  FDA at 1-800-FDA-1088. Where should I keep my medication? This medication is given in a hospital or clinic. It will not be stored at home. NOTE: This sheet is a summary. It may not cover all possible information. If you have questions about this medicine, talk to your doctor, pharmacist, or health care provider.  2024 Elsevier/Gold Standard (2021-09-04 00:00:00) Irinotecan  Injection What is this medication? IRINOTECAN  (ir in oh TEE kan) treats some types of cancer. It works by slowing down the growth of cancer cells. This medicine may be used for other purposes; ask your health care provider or pharmacist if you have questions. COMMON BRAND NAME(S): Camptosar  What should I tell my care team before I take this medication? They need to know if you have any of these conditions: Dehydration Diarrhea Infection, especially a viral infection, such as chickenpox, cold sores, herpes Liver disease Low blood cell levels (  white cells, red cells, and platelets) Low levels of electrolytes, such as calcium , magnesium , or potassium in your blood Recent or ongoing radiation An unusual or allergic reaction to irinotecan , other medications, foods, dyes, or preservatives If you or your partner are pregnant or trying to get pregnant Breast-feeding How should I use this medication? This medication is injected into a vein. It is given by your care team in a hospital or clinic setting. Talk to your care team about the use of this medication in children. Special care may be needed. Overdosage: If you think you have taken too much of this medicine contact a poison control center or emergency room at once. NOTE: This medicine is only for you. Do not share this medicine with others. What if I miss a dose? Keep appointments for follow-up doses. It is important not to miss your dose. Call your care team if you are unable to keep an appointment. What may interact with this medication? Do not take this medication  with any of the following: Cobicistat Itraconazole This medication may also interact with the following: Certain antibiotics, such as clarithromycin, rifampin, rifabutin Certain antivirals for HIV or AIDS Certain medications for fungal infections, such as ketoconazole, posaconazole, voriconazole Certain medications for seizures, such as carbamazepine, phenobarbital, phenytoin Gemfibrozil Nefazodone St. John's wort This list may not describe all possible interactions. Give your health care provider a list of all the medicines, herbs, non-prescription drugs, or dietary supplements you use. Also tell them if you smoke, drink alcohol, or use illegal drugs. Some items may interact with your medicine. What should I watch for while using this medication? Your condition will be monitored carefully while you are receiving this medication. You may need blood work while taking this medication. This medication may make you feel generally unwell. This is not uncommon as chemotherapy can affect healthy cells as well as cancer cells. Report any side effects. Continue your course of treatment even though you feel ill unless your care team tells you to stop. This medication can cause serious side effects. To reduce the risk, your care team may give you other medications to take before receiving this one. Be sure to follow the directions from your care team. This medication may affect your coordination, reaction time, or judgement. Do not drive or operate machinery until you know how this medication affects you. Sit up or stand slowly to reduce the risk of dizzy or fainting spells. Drinking alcohol with this medication can increase the risk of these side effects. This medication may increase your risk of getting an infection. Call your care team for advice if you get a fever, chills, sore throat, or other symptoms of a cold or flu. Do not treat yourself. Try to avoid being around people who are sick. Avoid taking  medications that contain aspirin, acetaminophen , ibuprofen, naproxen, or ketoprofen unless instructed by your care team. These medications may hide a fever. This medication may increase your risk to bruise or bleed. Call your care team if you notice any unusual bleeding. Be careful brushing or flossing your teeth or using a toothpick because you may get an infection or bleed more easily. If you have any dental work done, tell your dentist you are receiving this medication. Talk to your care team if you or your partner are pregnant or think either of you might be pregnant. This medication can cause serious birth defects if taken during pregnancy and for 6 months after the last dose. You will need a negative  pregnancy test before starting this medication. Contraception is recommended while taking this medication and for 6 months after the last dose. Your care team can help you find the option that works for you. Do not father a child while taking this medication and for 3 months after the last dose. Use a condom for contraception during this time period. Do not breastfeed while taking this medication and for 7 days after the last dose. This medication may cause infertility. Talk to your care team if you are concerned about your fertility. What side effects may I notice from receiving this medication? Side effects that you should report to your care team as soon as possible: Allergic reactions--skin rash, itching, hives, swelling of the face, lips, tongue, or throat Dry cough, shortness of breath or trouble breathing Increased saliva or tears, increased sweating, stomach cramping, diarrhea, small pupils, unusual weakness or fatigue, slow heartbeat Infection--fever, chills, cough, sore throat, wounds that don't heal, pain or trouble when passing urine, general feeling of discomfort or being unwell Kidney injury--decrease in the amount of urine, swelling of the ankles, hands, or feet Low red blood cell  level--unusual weakness or fatigue, dizziness, headache, trouble breathing Severe or prolonged diarrhea Unusual bruising or bleeding Side effects that usually do not require medical attention (report to your care team if they continue or are bothersome): Constipation Diarrhea Hair loss Loss of appetite Nausea Stomach pain This list may not describe all possible side effects. Call your doctor for medical advice about side effects. You may report side effects to FDA at 1-800-FDA-1088. Where should I keep my medication? This medication is given in a hospital or clinic. It will not be stored at home. NOTE: This sheet is a summary. It may not cover all possible information. If you have questions about this medicine, talk to your doctor, pharmacist, or health care provider.  2024 Elsevier/Gold Standard (2021-08-13 00:00:00) Fluorouracil  Injection What is this medication? FLUOROURACIL  (flure oh YOOR a sil) treats some types of cancer. It works by slowing down the growth of cancer cells. This medicine may be used for other purposes; ask your health care provider or pharmacist if you have questions. COMMON BRAND NAME(S): Adrucil  What should I tell my care team before I take this medication? They need to know if you have any of these conditions: Blood disorders Dihydropyrimidine dehydrogenase (DPD) deficiency Infection, such as chickenpox, cold sores, herpes Kidney disease Liver disease Poor nutrition Recent or ongoing radiation therapy An unusual or allergic reaction to fluorouracil , other medications, foods, dyes, or preservatives If you or your partner are pregnant or trying to get pregnant Breast-feeding How should I use this medication? This medication is injected into a vein. It is administered by your care team in a hospital or clinic setting. Talk to your care team about the use of this medication in children. Special care may be needed. Overdosage: If you think you have taken too  much of this medicine contact a poison control center or emergency room at once. NOTE: This medicine is only for you. Do not share this medicine with others. What if I miss a dose? Keep appointments for follow-up doses. It is important not to miss your dose. Call your care team if you are unable to keep an appointment. What may interact with this medication? Do not take this medication with any of the following: Live virus vaccines This medication may also interact with the following: Medications that treat or prevent blood clots, such as warfarin, enoxaparin, dalteparin This  list may not describe all possible interactions. Give your health care provider a list of all the medicines, herbs, non-prescription drugs, or dietary supplements you use. Also tell them if you smoke, drink alcohol, or use illegal drugs. Some items may interact with your medicine. What should I watch for while using this medication? Your condition will be monitored carefully while you are receiving this medication. This medication may make you feel generally unwell. This is not uncommon as chemotherapy can affect healthy cells as well as cancer cells. Report any side effects. Continue your course of treatment even though you feel ill unless your care team tells you to stop. In some cases, you may be given additional medications to help with side effects. Follow all directions for their use. This medication may increase your risk of getting an infection. Call your care team for advice if you get a fever, chills, sore throat, or other symptoms of a cold or flu. Do not treat yourself. Try to avoid being around people who are sick. This medication may increase your risk to bruise or bleed. Call your care team if you notice any unusual bleeding. Be careful brushing or flossing your teeth or using a toothpick because you may get an infection or bleed more easily. If you have any dental work done, tell your dentist you are receiving  this medication. Avoid taking medications that contain aspirin, acetaminophen , ibuprofen, naproxen, or ketoprofen unless instructed by your care team. These medications may hide a fever. Do not treat diarrhea with over the counter products. Contact your care team if you have diarrhea that lasts more than 2 days or if it is severe and watery. This medication can make you more sensitive to the sun. Keep out of the sun. If you cannot avoid being in the sun, wear protective clothing and sunscreen. Do not use sun lamps, tanning beds, or tanning booths. Talk to your care team if you or your partner wish to become pregnant or think you might be pregnant. This medication can cause serious birth defects if taken during pregnancy and for 3 months after the last dose. A reliable form of contraception is recommended while taking this medication and for 3 months after the last dose. Talk to your care team about effective forms of contraception. Do not father a child while taking this medication and for 3 months after the last dose. Use a condom while having sex during this time period. Do not breastfeed while taking this medication. This medication may cause infertility. Talk to your care team if you are concerned about your fertility. What side effects may I notice from receiving this medication? Side effects that you should report to your care team as soon as possible: Allergic reactions--skin rash, itching, hives, swelling of the face, lips, tongue, or throat Heart attack--pain or tightness in the chest, shoulders, arms, or jaw, nausea, shortness of breath, cold or clammy skin, feeling faint or lightheaded Heart failure--shortness of breath, swelling of the ankles, feet, or hands, sudden weight gain, unusual weakness or fatigue Heart rhythm changes--fast or irregular heartbeat, dizziness, feeling faint or lightheaded, chest pain, trouble breathing High ammonia level--unusual weakness or fatigue, confusion, loss  of appetite, nausea, vomiting, seizures Infection--fever, chills, cough, sore throat, wounds that don't heal, pain or trouble when passing urine, general feeling of discomfort or being unwell Low red blood cell level--unusual weakness or fatigue, dizziness, headache, trouble breathing Pain, tingling, or numbness in the hands or feet, muscle weakness, change in vision, confusion or  trouble speaking, loss of balance or coordination, trouble walking, seizures Redness, swelling, and blistering of the skin over hands and feet Severe or prolonged diarrhea Unusual bruising or bleeding Side effects that usually do not require medical attention (report to your care team if they continue or are bothersome): Dry skin Headache Increased tears Nausea Pain, redness, or swelling with sores inside the mouth or throat Sensitivity to light Vomiting This list may not describe all possible side effects. Call your doctor for medical advice about side effects. You may report side effects to FDA at 1-800-FDA-1088. Where should I keep my medication? This medication is given in a hospital or clinic. It will not be stored at home. NOTE: This sheet is a summary. It may not cover all possible information. If you have questions about this medicine, talk to your doctor, pharmacist, or health care provider.  2024 Elsevier/Gold Standard (2021-08-07 00:00:00)

## 2023-04-29 NOTE — Progress Notes (Signed)
 Patient seen by Olam Ned NP today  Vitals are within treatment parameters:Yes   Labs are within treatment parameters: No (Please specify and give further instructions.)  K+ 3.3 Alkaline Phosphatase 382 Treatment plan has been signed: Yes   Per physician team, Patient is ready for treatment and there are NO modifications to the treatment plan.

## 2023-04-29 NOTE — Progress Notes (Signed)
  [facility] OFFICE PROGRESS NOTE   Diagnosis: Pancreas cancer  INTERVAL HISTORY:   Randy Allen returns as scheduled.  He completed cycle 4 FOLFIRINOX 04/15/2023.  He denies nausea/vomiting.  No mouth sores.  No significant diarrhea.  He has mild persistent cold sensitivity mainly in the fingers.  No numbness or tingling in the absence of cold exposure.  Objective:  Vital signs in last 24 hours:  Blood pressure 125/85, pulse 61, temperature 98.2 F (36.8 C), temperature source Temporal, resp. rate 18, height 5' 8 (1.727 m), weight 145 lb 9.6 oz (66 kg), SpO2 100%.    HEENT: No thrush or ulcers. Resp: Lungs clear bilaterally. Cardio: Regular rate and rhythm. GI: Abdomen soft and nontender.  No hepatosplenomegaly. Vascular: No leg edema. Neuro: Vibratory sense intact over the fingertips per tuning fork exam. Skin: Palms without erythema. Port-A-Cath without erythema.  Lab Results:  Lab Results  Component Value Date   WBC 17.2 (H) 04/29/2023   HGB 11.4 (L) 04/29/2023   HCT 33.7 (L) 04/29/2023   MCV 90.6 04/29/2023   PLT 108 (L) 04/29/2023   NEUTROABS 12.0 (H) 04/29/2023    Imaging:  No results found.  Medications: I have reviewed the patient's current medications.  Assessment/Plan: Pancreas cancer Elevated liver enzymes/bilirubin September 2024 CT abdomen/pelvis 01/10/2023-no focal liver abnormality, no biliary dilation, pancreas unremarkable MRI/MRCP 01/15/2023-mild intra and extrahepatic biliary duct dilation, common duct is abruptly truncated in the superior pancreas head, hypoenhancing mass in the central pancreas head measuring 2.6 x 2.4 cm, remaining pancreas is atrophic with dilation of the main pancreatic duct, mass contacts the adjacent central SMV and portal confluence with less than 180 degrees of contact, preserved fat plane to the SMA with no enlarged abdominal lymph nodes, no ascites ERCP 01/18/2023-single stenosis in the lower third of the main  bile duct, brushings obtained, plastic stent placed-adenocarcinoma EUS 02/06/2023-30 x 30 mm pancreas head mass with irregular outer margins, abutment of the portal vein and SMV with intact fat planes, intact interface between the mass and SMA and celiac trunk, common bile duct stent, no malignant appearing lymph nodes, T2 N0 Cycle 1 FOLFIRINOX 02/19/2023 Chemotherapy held 03/04/2023 due to neutropenia Cycle 2 FOLFIRINOX 03/10/2023, Fulphila  Cycle 3 held 03/24/2023 due to liver function abnormalities Cycle 3 FOLFIRINOX 04/01/2023, Fulphila  Cycle 4 FOLFIRINOX 04/15/2023, Fulphila  Cycle 5 FOLFIRINOX 04/29/2023, Fulphila  Abdomen/back pain secondary to #1 Bicuspid aortic valve Hypertension Antral gastritis and ulcers on EUS 02/06/2023-Prilosec Hyperglycemia    Disposition: Randy Allen appears stable.  He has completed 4 cycles of FOLFIRINOX.  He continues to tolerate chemotherapy well.  Plan to proceed with cycle 5 today as scheduled.  CBC and chemistry panel reviewed.  Labs adequate to proceed as above.  He has mild thrombocytopenia.  He will contact the office with bleeding.  He has mild hypokalemia.  He will begin a potassium supplement.  AST and ALT have normalized.  He will return for follow-up and treatment in 2 weeks.    Olam Ned ANP/GNP-BC   04/29/2023  10:03 AM

## 2023-04-30 ENCOUNTER — Ambulatory Visit: Payer: BC Managed Care – PPO | Admitting: Nurse Practitioner

## 2023-05-01 ENCOUNTER — Inpatient Hospital Stay: Payer: BC Managed Care – PPO

## 2023-05-01 VITALS — BP 120/88 | HR 80 | Temp 98.1°F | Resp 18

## 2023-05-01 DIAGNOSIS — E876 Hypokalemia: Secondary | ICD-10-CM | POA: Diagnosis not present

## 2023-05-01 DIAGNOSIS — Z8719 Personal history of other diseases of the digestive system: Secondary | ICD-10-CM | POA: Diagnosis not present

## 2023-05-01 DIAGNOSIS — C250 Malignant neoplasm of head of pancreas: Secondary | ICD-10-CM

## 2023-05-01 DIAGNOSIS — Z5189 Encounter for other specified aftercare: Secondary | ICD-10-CM | POA: Diagnosis not present

## 2023-05-01 DIAGNOSIS — Z452 Encounter for adjustment and management of vascular access device: Secondary | ICD-10-CM | POA: Diagnosis not present

## 2023-05-01 DIAGNOSIS — I10 Essential (primary) hypertension: Secondary | ICD-10-CM | POA: Diagnosis not present

## 2023-05-01 DIAGNOSIS — Q2381 Bicuspid aortic valve: Secondary | ICD-10-CM | POA: Diagnosis not present

## 2023-05-01 DIAGNOSIS — R739 Hyperglycemia, unspecified: Secondary | ICD-10-CM | POA: Diagnosis not present

## 2023-05-01 DIAGNOSIS — D696 Thrombocytopenia, unspecified: Secondary | ICD-10-CM | POA: Diagnosis not present

## 2023-05-01 DIAGNOSIS — Z5111 Encounter for antineoplastic chemotherapy: Secondary | ICD-10-CM | POA: Diagnosis not present

## 2023-05-01 MED ORDER — PEGFILGRASTIM-JMDB 6 MG/0.6ML ~~LOC~~ SOSY
6.0000 mg | PREFILLED_SYRINGE | Freq: Once | SUBCUTANEOUS | Status: AC
Start: 2023-05-01 — End: 2023-05-01
  Administered 2023-05-01: 6 mg via SUBCUTANEOUS
  Filled 2023-05-01: qty 0.6

## 2023-05-01 MED ORDER — SODIUM CHLORIDE 0.9% FLUSH
10.0000 mL | INTRAVENOUS | Status: DC | PRN
  Administered 2023-05-01: 10 mL

## 2023-05-01 MED ORDER — HEPARIN SOD (PORK) LOCK FLUSH 100 UNIT/ML IV SOLN
500.0000 [IU] | Freq: Once | INTRAVENOUS | Status: AC | PRN
  Administered 2023-05-01: 500 [IU]

## 2023-05-01 NOTE — Patient Instructions (Signed)

## 2023-05-10 ENCOUNTER — Other Ambulatory Visit: Payer: Self-pay | Admitting: Oncology

## 2023-05-12 ENCOUNTER — Other Ambulatory Visit: Payer: Self-pay

## 2023-05-12 DIAGNOSIS — E039 Hypothyroidism, unspecified: Secondary | ICD-10-CM

## 2023-05-12 MED ORDER — LEVOTHYROXINE SODIUM 75 MCG PO TABS: ORAL_TABLET | ORAL | 3 refills | Status: DC

## 2023-05-13 ENCOUNTER — Inpatient Hospital Stay (HOSPITAL_BASED_OUTPATIENT_CLINIC_OR_DEPARTMENT_OTHER): Payer: BC Managed Care – PPO | Admitting: Nurse Practitioner

## 2023-05-13 ENCOUNTER — Encounter: Payer: Self-pay | Admitting: Nurse Practitioner

## 2023-05-13 ENCOUNTER — Inpatient Hospital Stay: Payer: BC Managed Care – PPO

## 2023-05-13 VITALS — BP 130/89 | HR 67 | Temp 98.1°F | Resp 18 | Ht 68.0 in | Wt 141.9 lb

## 2023-05-13 VITALS — BP 113/79 | HR 62

## 2023-05-13 DIAGNOSIS — C250 Malignant neoplasm of head of pancreas: Secondary | ICD-10-CM | POA: Diagnosis not present

## 2023-05-13 DIAGNOSIS — L989 Disorder of the skin and subcutaneous tissue, unspecified: Secondary | ICD-10-CM | POA: Diagnosis not present

## 2023-05-13 DIAGNOSIS — E876 Hypokalemia: Secondary | ICD-10-CM | POA: Diagnosis not present

## 2023-05-13 DIAGNOSIS — D696 Thrombocytopenia, unspecified: Secondary | ICD-10-CM | POA: Diagnosis not present

## 2023-05-13 DIAGNOSIS — Z452 Encounter for adjustment and management of vascular access device: Secondary | ICD-10-CM | POA: Diagnosis not present

## 2023-05-13 DIAGNOSIS — Z5189 Encounter for other specified aftercare: Secondary | ICD-10-CM | POA: Diagnosis not present

## 2023-05-13 DIAGNOSIS — Z5111 Encounter for antineoplastic chemotherapy: Secondary | ICD-10-CM | POA: Diagnosis not present

## 2023-05-13 DIAGNOSIS — I10 Essential (primary) hypertension: Secondary | ICD-10-CM | POA: Diagnosis not present

## 2023-05-13 DIAGNOSIS — Z8719 Personal history of other diseases of the digestive system: Secondary | ICD-10-CM | POA: Diagnosis not present

## 2023-05-13 DIAGNOSIS — Q2381 Bicuspid aortic valve: Secondary | ICD-10-CM | POA: Diagnosis not present

## 2023-05-13 DIAGNOSIS — R739 Hyperglycemia, unspecified: Secondary | ICD-10-CM | POA: Diagnosis not present

## 2023-05-13 LAB — CMP (CANCER CENTER ONLY)
ALT: 32 U/L (ref 0–44)
AST: 39 U/L (ref 15–41)
Albumin: 3.5 g/dL (ref 3.5–5.0)
Alkaline Phosphatase: 273 U/L — ABNORMAL HIGH (ref 38–126)
Anion gap: 6 (ref 5–15)
BUN: 15 mg/dL (ref 8–23)
CO2: 27 mmol/L (ref 22–32)
Calcium: 9.1 mg/dL (ref 8.9–10.3)
Chloride: 106 mmol/L (ref 98–111)
Creatinine: 0.8 mg/dL (ref 0.61–1.24)
GFR, Estimated: >60 mL/min (ref >60)
Glucose, Bld: 85 mg/dL (ref 70–99)
Potassium: 3.7 mmol/L (ref 3.5–5.1)
Sodium: 139 mmol/L (ref 135–145)
Total Bilirubin: 0.4 mg/dL (ref 0.0–1.2)
Total Protein: 6.3 g/dL — ABNORMAL LOW (ref 6.5–8.1)

## 2023-05-13 LAB — CBC WITH DIFFERENTIAL (CANCER CENTER ONLY)
Abs Immature Granulocytes: 0.51 10*3/uL — ABNORMAL HIGH (ref 0.00–0.07)
Basophils Absolute: 0 10*3/uL (ref 0.0–0.1)
Basophils Relative: 0 %
Eosinophils Absolute: 0.1 10*3/uL (ref 0.0–0.5)
Eosinophils Relative: 1 %
HCT: 31.7 % — ABNORMAL LOW (ref 39.0–52.0)
Hemoglobin: 10.7 g/dL — ABNORMAL LOW (ref 13.0–17.0)
Immature Granulocytes: 7 %
Lymphocytes Relative: 17 %
Lymphs Abs: 1.3 10*3/uL (ref 0.7–4.0)
MCH: 31.7 pg (ref 26.0–34.0)
MCHC: 33.8 g/dL (ref 30.0–36.0)
MCV: 93.8 fL (ref 80.0–100.0)
Monocytes Absolute: 0.7 10*3/uL (ref 0.1–1.0)
Monocytes Relative: 9 %
Neutro Abs: 5 10*3/uL (ref 1.7–7.7)
Neutrophils Relative %: 66 %
Platelet Count: 121 10*3/uL — ABNORMAL LOW (ref 150–400)
RBC: 3.38 MIL/uL — ABNORMAL LOW (ref 4.22–5.81)
RDW: 20.2 % — ABNORMAL HIGH (ref 11.5–15.5)
Smear Review: ADEQUATE
WBC Count: 7.6 10*3/uL (ref 4.0–10.5)
nRBC: 0 % (ref 0.0–0.2)

## 2023-05-13 MED ORDER — OXALIPLATIN CHEMO INJECTION 100 MG/20ML
85.0000 mg/m2 | Freq: Once | INTRAVENOUS | Status: AC
  Administered 2023-05-13: 150 mg via INTRAVENOUS
  Filled 2023-05-13: qty 20

## 2023-05-13 MED ORDER — DEXTROSE 5 % IV SOLN: INTRAVENOUS | Status: DC

## 2023-05-13 MED ORDER — SODIUM CHLORIDE 0.9% FLUSH
10.0000 mL | INTRAVENOUS | Status: DC | PRN
  Administered 2023-05-13: 10 mL

## 2023-05-13 MED ORDER — SODIUM CHLORIDE 0.9 % IV SOLN
400.0000 mg/m2 | Freq: Once | INTRAVENOUS | Status: AC
  Administered 2023-05-13: 724 mg via INTRAVENOUS
  Filled 2023-05-13: qty 36.2

## 2023-05-13 MED ORDER — SODIUM CHLORIDE 0.9 % IV SOLN
2400.0000 mg/m2 | INTRAVENOUS | Status: DC
  Administered 2023-05-13: 4350 mg via INTRAVENOUS
  Filled 2023-05-13: qty 87

## 2023-05-13 MED ORDER — SODIUM CHLORIDE 0.9 % IV SOLN
150.0000 mg/m2 | Freq: Once | INTRAVENOUS | Status: AC
  Administered 2023-05-13: 300 mg via INTRAVENOUS
  Filled 2023-05-13: qty 15

## 2023-05-13 MED ORDER — SODIUM CHLORIDE 0.9 % IV SOLN
150.0000 mg | Freq: Once | INTRAVENOUS | Status: AC
  Administered 2023-05-13: 150 mg via INTRAVENOUS
  Filled 2023-05-13: qty 150

## 2023-05-13 MED ORDER — PALONOSETRON HCL INJECTION 0.25 MG/5ML
0.2500 mg | Freq: Once | INTRAVENOUS | Status: AC
Start: 2023-05-13 — End: 2023-05-13
  Administered 2023-05-13: 0.25 mg via INTRAVENOUS
  Filled 2023-05-13: qty 5

## 2023-05-13 MED ORDER — ATROPINE SULFATE 1 MG/ML IV SOLN
0.5000 mg | Freq: Once | INTRAVENOUS | Status: AC | PRN
  Administered 2023-05-13: 0.5 mg via INTRAVENOUS
  Filled 2023-05-13: qty 1

## 2023-05-13 MED ORDER — OXYCODONE HCL 5 MG PO TABS: 5.0000 mg | ORAL_TABLET | ORAL | 0 refills | Status: DC | PRN

## 2023-05-13 MED ORDER — DEXAMETHASONE SODIUM PHOSPHATE 10 MG/ML IJ SOLN
10.0000 mg | Freq: Once | INTRAMUSCULAR | Status: AC
  Administered 2023-05-13: 10 mg via INTRAVENOUS
  Filled 2023-05-13: qty 1

## 2023-05-13 NOTE — Progress Notes (Signed)
Patient seen by Lonna Cobb NP today  Vitals are within treatment parameters:Yes   Labs are within treatment parameters: Yes   Treatment plan has been signed: Yes   Per physician team, Patient is ready for treatment and there are NO modifications to the treatment plan.

## 2023-05-13 NOTE — Patient Instructions (Signed)
Implanted [facility] Guide An implanted port is a device that is placed under the skin. It is usually placed in the chest. The device may vary based on the need. Implanted ports can be used to give IV medicine, to take blood, or to give fluids. You may have an implanted port if: You need IV medicine that would be irritating to the small veins in your hands or arms. You need IV medicines, such as chemotherapy, for a long period of time. You need IV nutrition for a long period of time. You may have fewer limitations when using a port than you would if you used other types of long-term IVs. You will also likely be able to return to normal activities after your incision heals. An implanted port has two main parts: Reservoir. The reservoir is the part where a needle is inserted to give medicines or draw blood. The reservoir is round. After the port is placed, it appears as a small, raised area under your skin. Catheter. The catheter is a small, thin tube that connects the reservoir to a vein. Medicine that is inserted into the reservoir goes into the catheter and then into the vein. How is my port accessed? To access your port: A numbing cream may be placed on the skin over the port site. Your health care provider will put on a mask and sterile gloves. The skin over your port will be cleaned carefully with a germ-killing soap and allowed to dry. Your health care provider will gently pinch the port and insert a needle into it. Your health care provider will check for a blood return to make sure the port is in the vein and is still working (patent). If your port needs to remain accessed to get medicine continuously (constant infusion), your health care provider will place a clear bandage (dressing) over the needle site. The dressing and needle will need to be changed every week, or as told by your health care provider. What is flushing? Flushing helps keep the port working. Follow instructions from your  health care provider about how and when to flush the port. Ports are usually flushed with saline solution or a medicine called heparin. The need for flushing will depend on how the port is used: If the port is only used from time to time to give medicines or draw blood, the port may need to be flushed: Before and after medicines have been given. Before and after blood has been drawn. As part of routine maintenance. Flushing may be recommended every 4-6 weeks. If a constant infusion is running, the port may not need to be flushed. Throw away any syringes in a disposal container that is meant for sharp items (sharps container). You can buy a sharps container from a pharmacy, or you can make one by using an empty hard plastic bottle with a cover. How long will my port stay implanted? The port can stay in for as long as your health care provider thinks it is needed. When it is time for the port to come out, a surgery will be done to remove it. The surgery will be similar to the procedure that was done to put the port in. Follow these instructions at home: Caring for your port and port site Flush your port as told by your health care provider. If you need an infusion over several days, follow instructions from your health care provider about how to take care of your port site. Make sure you: Change your  dressing as told by your health care provider. Wash your hands with soap and water for at least 20 seconds before and after you change your dressing. If soap and water are not available, use alcohol-based hand sanitizer. Place any used dressings or infusion bags into a plastic bag. Throw that bag in the trash. Keep the dressing that covers the needle clean and dry. Do not get it wet. Do not use scissors or sharp objects near the infusion tubing. Keep any external tubes clamped, unless they are being used. Check your port site every day for signs of infection. Check for: Redness, swelling, or  pain. Fluid or blood. Warmth. Pus or a bad smell. Protect the skin around the port site. Avoid wearing bra straps that rub or irritate the site. Protect the skin around your port from seat belts. Place a soft pad over your chest if needed. Bathe or shower as told by your health care provider. The site may get wet as long as you are not actively receiving an infusion. General instructions  Return to your normal activities as told by your health care provider. Ask your health care provider what activities are safe for you. Carry a medical alert card or wear a medical alert bracelet at all times. This will let health care providers know that you have an implanted port in case of an emergency. Where to find more information American Cancer Society: www.cancer.org American Society of Clinical Oncology: www.cancer.net Contact a health care provider if: You have a fever or chills. You have redness, swelling, or pain at the port site. You have fluid or blood coming from your port site. Your incision feels warm to the touch. You have pus or a bad smell coming from the port site. Summary Implanted ports are usually placed in the chest for long-term IV access. Follow instructions from your health care provider about flushing the port and changing bandages (dressings). Take care of the area around your port by avoiding clothing that puts pressure on the area, and by watching for signs of infection. Protect the skin around your port from seat belts. Place a soft pad over your chest if needed. Contact a health care provider if you have a fever or you have redness, swelling, pain, fluid, or a bad smell at the port site. This information is not intended to replace advice given to you by your health care provider. Make sure you discuss any questions you have with your health care provider. Document Revised: 10/03/2020 Document Reviewed: 10/03/2020 Elsevier Patient Education  2024 [employer].

## 2023-05-13 NOTE — Patient Instructions (Addendum)
[facility] - A DEPT OF [place]   The chemotherapy medication bag should finish at 46 hours, 96 hours, or 7 days. For example, if your pump is scheduled for 46 hours and it was put on at 4:00 p.m., it should finish at 2:00 p.m. the day it is scheduled to come off regardless of your appointment time.     Estimated time to finish at 12:30pm on Thursday May 15, 2023.   If the display on your pump reads "Low Volume" and it is beeping, take the batteries out of the pump and come to the cancer center for it to be taken off.   If the pump alarms go off prior to the pump reading "Low Volume" then call [phone] and someone can assist you.  If the plunger comes out and the chemotherapy medication is leaking out, please use your home chemo spill kit to clean up the spill. Do NOT use paper towels or other household products.  If you have problems or questions regarding your pump, please call either [phone] (24 hours a day) or the cancer center Monday-Friday 8:00 a.m.- 4:30 p.m. at the clinic number and we will assist you. If you are unable to get assistance, then go to the nearest Emergency Department and ask the staff to contact the IV team for assistance.  Discharge Instructions: Thank you for choosing [facility] to provide your oncology and hematology care.   If you have a lab appointment with the Cancer Center, please go directly to the Cancer Center and check in at the registration area.   Wear comfortable clothing and clothing appropriate for easy access to any Portacath or PICC line.   We strive to give you quality time with your provider. You may need to reschedule your appointment if you arrive late (15 or more minutes).  Arriving late affects you and other patients whose appointments are after yours.  Also, if you miss three or more appointments without notifying the office, you may be dismissed from the clinic at the provider's  discretion.      For prescription refill requests, have your pharmacy contact our office and allow 72 hours for refills to be completed.    Today you received the following chemotherapy and/or immunotherapy agents Oxaliplatin, Leucovorin, Irinotecan, Fluorouracil      To help prevent nausea and vomiting after your treatment, we encourage you to take your nausea medication as directed.  BELOW ARE SYMPTOMS THAT SHOULD BE REPORTED IMMEDIATELY: *FEVER GREATER THAN 100.4 F (38 C) OR HIGHER *CHILLS OR SWEATING *NAUSEA AND VOMITING THAT IS NOT CONTROLLED WITH YOUR NAUSEA MEDICATION *UNUSUAL SHORTNESS OF BREATH *UNUSUAL BRUISING OR BLEEDING *URINARY PROBLEMS (pain or burning when urinating, or frequent urination) *BOWEL PROBLEMS (unusual diarrhea, constipation, pain near the anus) TENDERNESS IN MOUTH AND THROAT WITH OR WITHOUT PRESENCE OF ULCERS (sore throat, sores in mouth, or a toothache) UNUSUAL RASH, SWELLING OR PAIN  UNUSUAL VAGINAL DISCHARGE OR ITCHING   Items with * indicate a potential emergency and should be followed up as soon as possible or go to the Emergency Department if any problems should occur.  Please show the CHEMOTHERAPY ALERT CARD or IMMUNOTHERAPY ALERT CARD at check-in to the Emergency Department and triage nurse.  Should you have questions after your visit or need to cancel or reschedule your appointment, please contact [facility] - A DEPT OF [facility]  Dept: [phone]  and follow the prompts.  Office hours are 8:00 a.m. to 4:30 p.m. Monday - Friday. Please note that voicemails left after 4:00 p.m. may not be returned until the following business day.  We are closed weekends and major holidays. You have access to a nurse at all times for urgent questions. Please call the main number to the clinic Dept: [phone] and follow the prompts.   For any non-urgent questions, you may also contact your provider using MyChart. We now offer  e-Visits for anyone 56 and older to request care online for non-urgent symptoms. For details visit [email or web address].   Also download the MyChart app! Go to the app store, search "MyChart", open the app, select [place], and log in with your MyChart username and password.  Oxaliplatin Injection What is this medication? OXALIPLATIN (ox AL i PLA tin) treats colorectal cancer. It works by slowing down the growth of cancer cells. This medicine may be used for other purposes; ask your health care provider or pharmacist if you have questions. COMMON BRAND NAME(S): Eloxatin What should I tell my care team before I take this medication? They need to know if you have any of these conditions: Heart disease History of irregular heartbeat or rhythm Liver disease Low blood cell levels (white cells, red cells, and platelets) Lung or breathing disease, such as asthma Take medications that treat or prevent blood clots Tingling of the fingers, toes, or other nerve disorder An unusual or allergic reaction to oxaliplatin, other medications, foods, dyes, or preservatives If you or your partner are pregnant or trying to get pregnant Breast-feeding How should I use this medication? This medication is injected into a vein. It is given by your care team in a hospital or clinic setting. Talk to your care team about the use of this medication in children. Special care may be needed. Overdosage: If you think you have taken too much of this medicine contact a poison control center or emergency room at once. NOTE: This medicine is only for you. Do not share this medicine with others. What if I miss a dose? Keep appointments for follow-up doses. It is important not to miss a dose. Call your care team if you are unable to keep an appointment. What may interact with this medication? Do not take this medication with any of the following: Cisapride Dronedarone Pimozide Thioridazine This medication may also  interact with the following: Aspirin and aspirin-like medications Certain medications that treat or prevent blood clots, such as warfarin, apixaban, dabigatran, and rivaroxaban Cisplatin Cyclosporine Diuretics Medications for infection, such as acyclovir, adefovir, amphotericin B, bacitracin, cidofovir, foscarnet, ganciclovir, gentamicin, pentamidine, vancomycin NSAIDs, medications for pain and inflammation, such as ibuprofen or naproxen Other medications that cause heart rhythm changes Pamidronate Zoledronic acid This list may not describe all possible interactions. Give your health care provider a list of all the medicines, herbs, non-prescription drugs, or dietary supplements you use. Also tell them if you smoke, drink alcohol, or use illegal drugs. Some items may interact with your medicine. What should I watch for while using this medication? Your condition will be monitored carefully while you are receiving this medication. You may need blood work while taking this medication. This medication may make you feel generally unwell. This is not uncommon as chemotherapy can affect healthy cells as well as cancer cells. Report any side effects. Continue your course of treatment even though you feel ill unless your care team tells you to stop. This medication may increase your risk of getting an infection. Call your  care team for advice if you get a fever, chills, sore throat, or other symptoms of a cold or flu. Do not treat yourself. Try to avoid being around people who are sick. Avoid taking medications that contain aspirin, acetaminophen, ibuprofen, naproxen, or ketoprofen unless instructed by your care team. These medications may hide a fever. Be careful brushing or flossing your teeth or using a toothpick because you may get an infection or bleed more easily. If you have any dental work done, tell your dentist you are receiving this medication. This medication can make you more sensitive to  cold. Do not drink cold drinks or use ice. Cover exposed skin before coming in contact with cold temperatures or cold objects. When out in cold weather wear warm clothing and cover your mouth and nose to warm the air that goes into your lungs. Tell your care team if you get sensitive to the cold. Talk to your care team if you or your partner are pregnant or think either of you might be pregnant. This medication can cause serious birth defects if taken during pregnancy and for 9 months after the last dose. A negative pregnancy test is required before starting this medication. A reliable form of contraception is recommended while taking this medication and for 9 months after the last dose. Talk to your care team about effective forms of contraception. Do not father a child while taking this medication and for 6 months after the last dose. Use a condom while having sex during this time period. Do not breastfeed while taking this medication and for 3 months after the last dose. This medication may cause infertility. Talk to your care team if you are concerned about your fertility. What side effects may I notice from receiving this medication? Side effects that you should report to your care team as soon as possible: Allergic reactions--skin rash, itching, hives, swelling of the face, lips, tongue, or throat Bleeding--bloody or black, tar-like stools, vomiting blood or brown material that looks like coffee grounds, red or dark brown urine, small red or purple spots on skin, unusual bruising or bleeding Dry cough, shortness of breath or trouble breathing Heart rhythm changes--fast or irregular heartbeat, dizziness, feeling faint or lightheaded, chest pain, trouble breathing Infection--fever, chills, cough, sore throat, wounds that don't heal, pain or trouble when passing urine, general feeling of discomfort or being unwell Liver injury--right upper belly pain, loss of appetite, nausea, light-colored stool, dark  yellow or brown urine, yellowing skin or eyes, unusual weakness or fatigue Low red blood cell level--unusual weakness or fatigue, dizziness, headache, trouble breathing Muscle injury--unusual weakness or fatigue, muscle pain, dark yellow or brown urine, decrease in amount of urine Pain, tingling, or numbness in the hands or feet Sudden and severe headache, confusion, change in vision, seizures, which may be signs of posterior reversible encephalopathy syndrome (PRES) Unusual bruising or bleeding Side effects that usually do not require medical attention (report to your care team if they continue or are bothersome): Diarrhea Nausea Pain, redness, or swelling with sores inside the mouth or throat Unusual weakness or fatigue Vomiting This list may not describe all possible side effects. Call your doctor for medical advice about side effects. You may report side effects to FDA at 1-800-FDA-1088. Where should I keep my medication? This medication is given in a hospital or clinic. It will not be stored at home. NOTE: This sheet is a summary. It may not cover all possible information. If you have questions about this medicine,  talk to your doctor, pharmacist, or health care provider.  2024 Elsevier/Gold Standard (2022-01-15 00:00:00) Leucovorin Injection What is this medication? LEUCOVORIN (loo koe VOR in) prevents side effects from certain medications, such as methotrexate. It works by increasing folate levels. This helps protect healthy cells in your body. It may also be used to treat anemia caused by low levels of folate. It can also be used with fluorouracil, a type of chemotherapy, to treat colorectal cancer. It works by increasing the effects of fluorouracil in the body. This medicine may be used for other purposes; ask your health care provider or pharmacist if you have questions. What should I tell my care team before I take this medication? They need to know if you have any of these  conditions: Anemia from low levels of vitamin B12 in the blood An unusual or allergic reaction to leucovorin, folic acid, other medications, foods, dyes, or preservatives Pregnant or trying to get pregnant Breastfeeding How should I use this medication? This medication is injected into a vein or a muscle. It is given by your care team in a hospital or clinic setting. Talk to your care team about the use of this medication in children. Special care may be needed. Overdosage: If you think you have taken too much of this medicine contact a poison control center or emergency room at once. NOTE: This medicine is only for you. Do not share this medicine with others. What if I miss a dose? Keep appointments for follow-up doses. It is important not to miss your dose. Call your care team if you are unable to keep an appointment. What may interact with this medication? Capecitabine Fluorouracil Phenobarbital Phenytoin Primidone Trimethoprim;sulfamethoxazole This list may not describe all possible interactions. Give your health care provider a list of all the medicines, herbs, non-prescription drugs, or dietary supplements you use. Also tell them if you smoke, drink alcohol, or use illegal drugs. Some items may interact with your medicine. What should I watch for while using this medication? Your condition will be monitored carefully while you are receiving this medication. This medication may increase the side effects of 5-fluorouracil. Tell your care team if you have diarrhea or mouth sores that do not get better or that get worse. What side effects may I notice from receiving this medication? Side effects that you should report to your care team as soon as possible: Allergic reactions--skin rash, itching, hives, swelling of the face, lips, tongue, or throat This list may not describe all possible side effects. Call your doctor for medical advice about side effects. You may report side effects to  FDA at 1-800-FDA-1088. Where should I keep my medication? This medication is given in a hospital or clinic. It will not be stored at home. NOTE: This sheet is a summary. It may not cover all possible information. If you have questions about this medicine, talk to your doctor, pharmacist, or health care provider.  2024 Elsevier/Gold Standard (2021-09-04 00:00:00) Irinotecan Injection What is this medication? IRINOTECAN (ir in oh TEE kan) treats some types of cancer. It works by slowing down the growth of cancer cells. This medicine may be used for other purposes; ask your health care provider or pharmacist if you have questions. COMMON BRAND NAME(S): Camptosar What should I tell my care team before I take this medication? They need to know if you have any of these conditions: Dehydration Diarrhea Infection, especially a viral infection, such as chickenpox, cold sores, herpes Liver disease Low blood cell levels (  white cells, red cells, and platelets) Low levels of electrolytes, such as calcium, magnesium, or potassium in your blood Recent or ongoing radiation An unusual or allergic reaction to irinotecan, other medications, foods, dyes, or preservatives If you or your partner are pregnant or trying to get pregnant Breast-feeding How should I use this medication? This medication is injected into a vein. It is given by your care team in a hospital or clinic setting. Talk to your care team about the use of this medication in children. Special care may be needed. Overdosage: If you think you have taken too much of this medicine contact a poison control center or emergency room at once. NOTE: This medicine is only for you. Do not share this medicine with others. What if I miss a dose? Keep appointments for follow-up doses. It is important not to miss your dose. Call your care team if you are unable to keep an appointment. What may interact with this medication? Do not take this medication  with any of the following: Cobicistat Itraconazole This medication may also interact with the following: Certain antibiotics, such as clarithromycin, rifampin, rifabutin Certain antivirals for HIV or AIDS Certain medications for fungal infections, such as ketoconazole, posaconazole, voriconazole Certain medications for seizures, such as carbamazepine, phenobarbital, phenytoin Gemfibrozil Nefazodone St. John's wort This list may not describe all possible interactions. Give your health care provider a list of all the medicines, herbs, non-prescription drugs, or dietary supplements you use. Also tell them if you smoke, drink alcohol, or use illegal drugs. Some items may interact with your medicine. What should I watch for while using this medication? Your condition will be monitored carefully while you are receiving this medication. You may need blood work while taking this medication. This medication may make you feel generally unwell. This is not uncommon as chemotherapy can affect healthy cells as well as cancer cells. Report any side effects. Continue your course of treatment even though you feel ill unless your care team tells you to stop. This medication can cause serious side effects. To reduce the risk, your care team may give you other medications to take before receiving this one. Be sure to follow the directions from your care team. This medication may affect your coordination, reaction time, or judgement. Do not drive or operate machinery until you know how this medication affects you. Sit up or stand slowly to reduce the risk of dizzy or fainting spells. Drinking alcohol with this medication can increase the risk of these side effects. This medication may increase your risk of getting an infection. Call your care team for advice if you get a fever, chills, sore throat, or other symptoms of a cold or flu. Do not treat yourself. Try to avoid being around people who are sick. Avoid taking  medications that contain aspirin, acetaminophen, ibuprofen, naproxen, or ketoprofen unless instructed by your care team. These medications may hide a fever. This medication may increase your risk to bruise or bleed. Call your care team if you notice any unusual bleeding. Be careful brushing or flossing your teeth or using a toothpick because you may get an infection or bleed more easily. If you have any dental work done, tell your dentist you are receiving this medication. Talk to your care team if you or your partner are pregnant or think either of you might be pregnant. This medication can cause serious birth defects if taken during pregnancy and for 6 months after the last dose. You will need a negative  pregnancy test before starting this medication. Contraception is recommended while taking this medication and for 6 months after the last dose. Your care team can help you find the option that works for you. Do not father a child while taking this medication and for 3 months after the last dose. Use a condom for contraception during this time period. Do not breastfeed while taking this medication and for 7 days after the last dose. This medication may cause infertility. Talk to your care team if you are concerned about your fertility. What side effects may I notice from receiving this medication? Side effects that you should report to your care team as soon as possible: Allergic reactions--skin rash, itching, hives, swelling of the face, lips, tongue, or throat Dry cough, shortness of breath or trouble breathing Increased saliva or tears, increased sweating, stomach cramping, diarrhea, small pupils, unusual weakness or fatigue, slow heartbeat Infection--fever, chills, cough, sore throat, wounds that don't heal, pain or trouble when passing urine, general feeling of discomfort or being unwell Kidney injury--decrease in the amount of urine, swelling of the ankles, hands, or feet Low red blood cell  level--unusual weakness or fatigue, dizziness, headache, trouble breathing Severe or prolonged diarrhea Unusual bruising or bleeding Side effects that usually do not require medical attention (report to your care team if they continue or are bothersome): Constipation Diarrhea Hair loss Loss of appetite Nausea Stomach pain This list may not describe all possible side effects. Call your doctor for medical advice about side effects. You may report side effects to FDA at 1-800-FDA-1088. Where should I keep my medication? This medication is given in a hospital or clinic. It will not be stored at home. NOTE: This sheet is a summary. It may not cover all possible information. If you have questions about this medicine, talk to your doctor, pharmacist, or health care provider.  2024 Elsevier/Gold Standard (2021-08-13 00:00:00) Fluorouracil Injection What is this medication? FLUOROURACIL (flure oh YOOR a sil) treats some types of cancer. It works by slowing down the growth of cancer cells. This medicine may be used for other purposes; ask your health care provider or pharmacist if you have questions. COMMON BRAND NAME(S): Adrucil What should I tell my care team before I take this medication? They need to know if you have any of these conditions: Blood disorders Dihydropyrimidine dehydrogenase (DPD) deficiency Infection, such as chickenpox, cold sores, herpes Kidney disease Liver disease Poor nutrition Recent or ongoing radiation therapy An unusual or allergic reaction to fluorouracil, other medications, foods, dyes, or preservatives If you or your partner are pregnant or trying to get pregnant Breast-feeding How should I use this medication? This medication is injected into a vein. It is administered by your care team in a hospital or clinic setting. Talk to your care team about the use of this medication in children. Special care may be needed. Overdosage: If you think you have taken too  much of this medicine contact a poison control center or emergency room at once. NOTE: This medicine is only for you. Do not share this medicine with others. What if I miss a dose? Keep appointments for follow-up doses. It is important not to miss your dose. Call your care team if you are unable to keep an appointment. What may interact with this medication? Do not take this medication with any of the following: Live virus vaccines This medication may also interact with the following: Medications that treat or prevent blood clots, such as warfarin, enoxaparin, dalteparin This  list may not describe all possible interactions. Give your health care provider a list of all the medicines, herbs, non-prescription drugs, or dietary supplements you use. Also tell them if you smoke, drink alcohol, or use illegal drugs. Some items may interact with your medicine. What should I watch for while using this medication? Your condition will be monitored carefully while you are receiving this medication. This medication may make you feel generally unwell. This is not uncommon as chemotherapy can affect healthy cells as well as cancer cells. Report any side effects. Continue your course of treatment even though you feel ill unless your care team tells you to stop. In some cases, you may be given additional medications to help with side effects. Follow all directions for their use. This medication may increase your risk of getting an infection. Call your care team for advice if you get a fever, chills, sore throat, or other symptoms of a cold or flu. Do not treat yourself. Try to avoid being around people who are sick. This medication may increase your risk to bruise or bleed. Call your care team if you notice any unusual bleeding. Be careful brushing or flossing your teeth or using a toothpick because you may get an infection or bleed more easily. If you have any dental work done, tell your dentist you are receiving  this medication. Avoid taking medications that contain aspirin, acetaminophen, ibuprofen, naproxen, or ketoprofen unless instructed by your care team. These medications may hide a fever. Do not treat diarrhea with over the counter products. Contact your care team if you have diarrhea that lasts more than 2 days or if it is severe and watery. This medication can make you more sensitive to the sun. Keep out of the sun. If you cannot avoid being in the sun, wear protective clothing and sunscreen. Do not use sun lamps, tanning beds, or tanning booths. Talk to your care team if you or your partner wish to become pregnant or think you might be pregnant. This medication can cause serious birth defects if taken during pregnancy and for 3 months after the last dose. A reliable form of contraception is recommended while taking this medication and for 3 months after the last dose. Talk to your care team about effective forms of contraception. Do not father a child while taking this medication and for 3 months after the last dose. Use a condom while having sex during this time period. Do not breastfeed while taking this medication. This medication may cause infertility. Talk to your care team if you are concerned about your fertility. What side effects may I notice from receiving this medication? Side effects that you should report to your care team as soon as possible: Allergic reactions--skin rash, itching, hives, swelling of the face, lips, tongue, or throat Heart attack--pain or tightness in the chest, shoulders, arms, or jaw, nausea, shortness of breath, cold or clammy skin, feeling faint or lightheaded Heart failure--shortness of breath, swelling of the ankles, feet, or hands, sudden weight gain, unusual weakness or fatigue Heart rhythm changes--fast or irregular heartbeat, dizziness, feeling faint or lightheaded, chest pain, trouble breathing High ammonia level--unusual weakness or fatigue, confusion, loss  of appetite, nausea, vomiting, seizures Infection--fever, chills, cough, sore throat, wounds that don't heal, pain or trouble when passing urine, general feeling of discomfort or being unwell Low red blood cell level--unusual weakness or fatigue, dizziness, headache, trouble breathing Pain, tingling, or numbness in the hands or feet, muscle weakness, change in vision, confusion or  trouble speaking, loss of balance or coordination, trouble walking, seizures Redness, swelling, and blistering of the skin over hands and feet Severe or prolonged diarrhea Unusual bruising or bleeding Side effects that usually do not require medical attention (report to your care team if they continue or are bothersome): Dry skin Headache Increased tears Nausea Pain, redness, or swelling with sores inside the mouth or throat Sensitivity to light Vomiting This list may not describe all possible side effects. Call your doctor for medical advice about side effects. You may report side effects to FDA at 1-800-FDA-1088. Where should I keep my medication? This medication is given in a hospital or clinic. It will not be stored at home. NOTE: This sheet is a summary. It may not cover all possible information. If you have questions about this medicine, talk to your doctor, pharmacist, or health care provider.  2024 [employer]vier/Gold Standard (2021-08-07 00:00:00)

## 2023-05-13 NOTE — Progress Notes (Signed)
[facility] OFFICE PROGRESS NOTE   Diagnosis: Pancreas cancer  INTERVAL HISTORY:   Mr. Dollar returns as scheduled.  He completed cycle 5 FOLFIRINOX 04/29/2023.  He had mild nausea and noted a decrease in appetite after treatment.  Some areas of tenderness in his mouth, no ulcerations.  Able to eat and drink without difficulty.  He denies significant diarrhea.  Prolonged cold sensitivity, resolved after about 12 days.  No numbness or tingling in the absence of cold exposure.  Abdominal pain has improved since beginning treatment.  He takes oxycodone infrequently.  He has noticed a existing skin lesion on the right cheek has become larger and darker.  Objective:  Vital signs in last 24 hours:  Blood pressure 130/89, pulse 67, temperature 98.1 F (36.7 C), temperature source Temporal, resp. rate 18, height 5\' 8"  (1.727 m), weight 141 lb 14.4 oz (64.4 kg), SpO2 100%.    HEENT: No thrush or ulcers. Resp: Lungs clear bilaterally. Cardio: Regular rate and rhythm. GI: Abdomen soft and nontender.  No hepatosplenomegaly. Vascular: No leg edema. Neuro: Vibratory sense mildly decreased over the fingertips per tuning fork exam. Skin: Palms without erythema.  Small raised brown skin lesion left malar region. Port-A-Cath without erythema.  Lab Results:  Lab Results  Component Value Date   WBC 7.6 05/13/2023   HGB 10.7 (L) 05/13/2023   HCT 31.7 (L) 05/13/2023   MCV 93.8 05/13/2023   PLT 121 (L) 05/13/2023   NEUTROABS PENDING 05/13/2023    Imaging:  No results found.  Medications: I have reviewed the patient's current medications.  Assessment/Plan: Pancreas cancer Elevated liver enzymes/bilirubin September 2024 CT abdomen/pelvis 01/10/2023-no focal liver abnormality, no biliary dilation, pancreas unremarkable MRI/MRCP 01/15/2023-mild intra and extrahepatic biliary duct dilation, common duct is abruptly truncated in the superior pancreas head, hypoenhancing mass in the  central pancreas head measuring 2.6 x 2.4 cm, remaining pancreas is atrophic with dilation of the main pancreatic duct, mass contacts the adjacent central SMV and portal confluence with less than 180 degrees of contact, preserved fat plane to the SMA with no enlarged abdominal lymph nodes, no ascites ERCP 01/18/2023-single stenosis in the lower third of the main bile duct, brushings obtained, plastic stent placed-adenocarcinoma EUS 02/06/2023-30 x 30 mm pancreas head mass with irregular outer margins, abutment of the portal vein and SMV with intact fat planes, intact interface between the mass and SMA and celiac trunk, common bile duct stent, no malignant appearing lymph nodes, T2 N0 Cycle 1 FOLFIRINOX 02/19/2023 Chemotherapy held 03/04/2023 due to neutropenia Cycle 2 FOLFIRINOX 03/10/2023, Fulphila Cycle 3 held 03/24/2023 due to liver function abnormalities Cycle 3 FOLFIRINOX 04/01/2023, Fulphila Cycle 4 FOLFIRINOX 04/15/2023, Fulphila Cycle 5 FOLFIRINOX 04/29/2023, Fulphila Cycle 6 FOLFIRINOX 05/13/2023, Fulphila Abdomen/back pain secondary to #1 Bicuspid aortic valve Hypertension Antral gastritis and ulcers on EUS 02/06/2023-Prilosec Hyperglycemia    Disposition: Mr. Gales appears stable.  He has completed 5 cycles of FOLFIRINOX.  Plan to proceed with cycle 6 today as scheduled.  Restaging CTs prior to next office visit.  CBC and chemistry panel reviewed.  Labs adequate to proceed as above.  He has mild thrombocytopenia.  He will contact the office with bleeding.  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  We will continue to monitor, adjust oxaliplatin as indicated.  Referral placed to dermatology to evaluate the skin lesion right malar region.  We discussed the possibility the change in appearance of the  lesion is due to chemotherapy.  He will return for follow-up and treatment in 2 weeks.  He will  contact the office in the interim as outlined above or with any other problems.    Lonna Cobb ANP/GNP-BC   05/13/2023  8:23 AM

## 2023-05-15 ENCOUNTER — Inpatient Hospital Stay: Payer: BC Managed Care – PPO

## 2023-05-15 VITALS — BP 121/90 | HR 72 | Temp 98.2°F | Resp 18

## 2023-05-15 DIAGNOSIS — Q2381 Bicuspid aortic valve: Secondary | ICD-10-CM | POA: Diagnosis not present

## 2023-05-15 DIAGNOSIS — I10 Essential (primary) hypertension: Secondary | ICD-10-CM | POA: Diagnosis not present

## 2023-05-15 DIAGNOSIS — E876 Hypokalemia: Secondary | ICD-10-CM | POA: Diagnosis not present

## 2023-05-15 DIAGNOSIS — Z452 Encounter for adjustment and management of vascular access device: Secondary | ICD-10-CM | POA: Diagnosis not present

## 2023-05-15 DIAGNOSIS — C250 Malignant neoplasm of head of pancreas: Secondary | ICD-10-CM | POA: Diagnosis not present

## 2023-05-15 DIAGNOSIS — Z5189 Encounter for other specified aftercare: Secondary | ICD-10-CM | POA: Diagnosis not present

## 2023-05-15 DIAGNOSIS — Z8719 Personal history of other diseases of the digestive system: Secondary | ICD-10-CM | POA: Diagnosis not present

## 2023-05-15 DIAGNOSIS — R739 Hyperglycemia, unspecified: Secondary | ICD-10-CM | POA: Diagnosis not present

## 2023-05-15 DIAGNOSIS — D696 Thrombocytopenia, unspecified: Secondary | ICD-10-CM | POA: Diagnosis not present

## 2023-05-15 DIAGNOSIS — Z5111 Encounter for antineoplastic chemotherapy: Secondary | ICD-10-CM | POA: Diagnosis not present

## 2023-05-15 MED ORDER — PEGFILGRASTIM-JMDB 6 MG/0.6ML ~~LOC~~ SOSY
6.0000 mg | PREFILLED_SYRINGE | Freq: Once | SUBCUTANEOUS | Status: AC
  Administered 2023-05-15: 6 mg via SUBCUTANEOUS
  Filled 2023-05-15: qty 0.6

## 2023-05-15 MED ORDER — SODIUM CHLORIDE 0.9% FLUSH
10.0000 mL | INTRAVENOUS | Status: DC | PRN
  Administered 2023-05-15: 10 mL

## 2023-05-15 MED ORDER — HEPARIN SOD (PORK) LOCK FLUSH 100 UNIT/ML IV SOLN
500.0000 [IU] | Freq: Once | INTRAVENOUS | Status: AC | PRN
  Administered 2023-05-15: 500 [IU]

## 2023-05-15 NOTE — Patient Instructions (Signed)

## 2023-05-19 ENCOUNTER — Ambulatory Visit (HOSPITAL_BASED_OUTPATIENT_CLINIC_OR_DEPARTMENT_OTHER)
Admission: RE | Admit: 2023-05-19 | Discharge: 2023-05-19 | Disposition: A | Discharge status: Home / Self Care | Payer: BC Managed Care – PPO | Source: Ambulatory Visit | Attending: Nurse Practitioner | Admitting: Nurse Practitioner

## 2023-05-19 ENCOUNTER — Encounter (HOSPITAL_BASED_OUTPATIENT_CLINIC_OR_DEPARTMENT_OTHER): Payer: Self-pay

## 2023-05-19 ENCOUNTER — Encounter: Payer: Self-pay | Admitting: *Deleted

## 2023-05-19 DIAGNOSIS — K5730 Diverticulosis of large intestine without perforation or abscess without bleeding: Secondary | ICD-10-CM | POA: Diagnosis not present

## 2023-05-19 DIAGNOSIS — C250 Malignant neoplasm of head of pancreas: Secondary | ICD-10-CM | POA: Diagnosis not present

## 2023-05-19 DIAGNOSIS — C259 Malignant neoplasm of pancreas, unspecified: Secondary | ICD-10-CM | POA: Diagnosis not present

## 2023-05-19 DIAGNOSIS — K8689 Other specified diseases of pancreas: Secondary | ICD-10-CM | POA: Diagnosis not present

## 2023-05-19 MED ORDER — IOHEXOL 350 MG/ML SOLN
100.0000 mL | Freq: Once | INTRAVENOUS | Status: AC | PRN
  Administered 2023-05-19: 85 mL via INTRAVENOUS

## 2023-05-19 NOTE — Progress Notes (Signed)
Faxed referral order, demographics and medical records to [facility] [phone].

## 2023-05-20 ENCOUNTER — Ambulatory Visit: Payer: BC Managed Care – PPO | Admitting: Nurse Practitioner

## 2023-05-22 ENCOUNTER — Other Ambulatory Visit: Payer: Self-pay | Admitting: *Deleted

## 2023-05-22 ENCOUNTER — Ambulatory Visit (HOSPITAL_BASED_OUTPATIENT_CLINIC_OR_DEPARTMENT_OTHER): Payer: BC Managed Care – PPO

## 2023-05-22 DIAGNOSIS — C250 Malignant neoplasm of head of pancreas: Secondary | ICD-10-CM | POA: Diagnosis not present

## 2023-05-22 NOTE — Progress Notes (Signed)
 The proposed treatment discussed in conference is for discussion purpose only and is not a binding recommendation.  The patients have not been physically examined, or presented with their treatment options.  Therefore, final treatment plans cannot be decided.

## 2023-05-24 ENCOUNTER — Other Ambulatory Visit: Payer: Self-pay | Admitting: Oncology

## 2023-05-24 DIAGNOSIS — C250 Malignant neoplasm of head of pancreas: Secondary | ICD-10-CM

## 2023-05-27 ENCOUNTER — Inpatient Hospital Stay: Payer: BC Managed Care – PPO

## 2023-05-27 ENCOUNTER — Encounter: Payer: Self-pay | Admitting: *Deleted

## 2023-05-27 ENCOUNTER — Inpatient Hospital Stay: Payer: BC Managed Care – PPO | Attending: Oncology

## 2023-05-27 ENCOUNTER — Inpatient Hospital Stay: Payer: BC Managed Care – PPO | Admitting: Oncology

## 2023-05-27 VITALS — BP 104/84 | HR 77 | Temp 98.1°F | Resp 18 | Ht 68.0 in | Wt 138.4 lb

## 2023-05-27 DIAGNOSIS — I10 Essential (primary) hypertension: Secondary | ICD-10-CM | POA: Diagnosis not present

## 2023-05-27 DIAGNOSIS — Z5189 Encounter for other specified aftercare: Secondary | ICD-10-CM | POA: Insufficient documentation

## 2023-05-27 DIAGNOSIS — Q2381 Bicuspid aortic valve: Secondary | ICD-10-CM | POA: Insufficient documentation

## 2023-05-27 DIAGNOSIS — C250 Malignant neoplasm of head of pancreas: Secondary | ICD-10-CM

## 2023-05-27 DIAGNOSIS — R197 Diarrhea, unspecified: Secondary | ICD-10-CM | POA: Diagnosis not present

## 2023-05-27 DIAGNOSIS — G620 Drug-induced polyneuropathy: Secondary | ICD-10-CM | POA: Insufficient documentation

## 2023-05-27 DIAGNOSIS — Z5111 Encounter for antineoplastic chemotherapy: Secondary | ICD-10-CM | POA: Diagnosis not present

## 2023-05-27 DIAGNOSIS — Z452 Encounter for adjustment and management of vascular access device: Secondary | ICD-10-CM | POA: Insufficient documentation

## 2023-05-27 LAB — CMP (CANCER CENTER ONLY)
ALT: 37 U/L (ref 0–44)
AST: 43 U/L — ABNORMAL HIGH (ref 15–41)
Albumin: 3.4 g/dL — ABNORMAL LOW (ref 3.5–5.0)
Alkaline Phosphatase: 345 U/L — ABNORMAL HIGH (ref 38–126)
Anion gap: 6 (ref 5–15)
BUN: 12 mg/dL (ref 8–23)
CO2: 28 mmol/L (ref 22–32)
Calcium: 9.4 mg/dL (ref 8.9–10.3)
Chloride: 104 mmol/L (ref 98–111)
Creatinine: 0.9 mg/dL (ref 0.61–1.24)
GFR, Estimated: >60 mL/min (ref >60)
Glucose, Bld: 109 mg/dL — ABNORMAL HIGH (ref 70–99)
Potassium: 3.4 mmol/L — ABNORMAL LOW (ref 3.5–5.1)
Sodium: 138 mmol/L (ref 135–145)
Total Bilirubin: 0.5 mg/dL (ref 0.0–1.2)
Total Protein: 6.1 g/dL — ABNORMAL LOW (ref 6.5–8.1)

## 2023-05-27 LAB — CBC WITH DIFFERENTIAL (CANCER CENTER ONLY)
Abs Immature Granulocytes: 1.09 10*3/uL — ABNORMAL HIGH (ref 0.00–0.07)
Basophils Absolute: 0 10*3/uL (ref 0.0–0.1)
Basophils Relative: 0 %
Eosinophils Absolute: 0.1 10*3/uL (ref 0.0–0.5)
Eosinophils Relative: 1 %
HCT: 31.3 % — ABNORMAL LOW (ref 39.0–52.0)
Hemoglobin: 10.4 g/dL — ABNORMAL LOW (ref 13.0–17.0)
Immature Granulocytes: 9 %
Lymphocytes Relative: 12 %
Lymphs Abs: 1.5 10*3/uL (ref 0.7–4.0)
MCH: 31.8 pg (ref 26.0–34.0)
MCHC: 33.2 g/dL (ref 30.0–36.0)
MCV: 95.7 fL (ref 80.0–100.0)
Monocytes Absolute: 0.9 10*3/uL (ref 0.1–1.0)
Monocytes Relative: 8 %
Neutro Abs: 8.4 10*3/uL — ABNORMAL HIGH (ref 1.7–7.7)
Neutrophils Relative %: 70 %
Platelet Count: 102 10*3/uL — ABNORMAL LOW (ref 150–400)
RBC: 3.27 MIL/uL — ABNORMAL LOW (ref 4.22–5.81)
RDW: 20.3 % — ABNORMAL HIGH (ref 11.5–15.5)
Smear Review: ADEQUATE
WBC Count: 12 10*3/uL — ABNORMAL HIGH (ref 4.0–10.5)
nRBC: 0.2 % (ref 0.0–0.2)

## 2023-05-27 MED ORDER — SODIUM CHLORIDE 0.9 % IV SOLN
150.0000 mg | Freq: Once | INTRAVENOUS | Status: AC
  Administered 2023-05-27: 150 mg via INTRAVENOUS
  Filled 2023-05-27: qty 150

## 2023-05-27 MED ORDER — PALONOSETRON HCL INJECTION 0.25 MG/5ML
0.2500 mg | Freq: Once | INTRAVENOUS | Status: AC
  Administered 2023-05-27: 0.25 mg via INTRAVENOUS
  Filled 2023-05-27: qty 5

## 2023-05-27 MED ORDER — DEXTROSE 5 % IV SOLN: INTRAVENOUS | Status: DC

## 2023-05-27 MED ORDER — ATROPINE SULFATE 1 MG/ML IV SOLN
0.5000 mg | Freq: Once | INTRAVENOUS | Status: AC | PRN
  Administered 2023-05-27: 0.5 mg via INTRAVENOUS
  Filled 2023-05-27: qty 1

## 2023-05-27 MED ORDER — SODIUM CHLORIDE 0.9 % IV SOLN
2400.0000 mg/m2 | INTRAVENOUS | Status: DC
  Administered 2023-05-27: 4200 mg via INTRAVENOUS
  Filled 2023-05-27: qty 84

## 2023-05-27 MED ORDER — DEXAMETHASONE SODIUM PHOSPHATE 10 MG/ML IJ SOLN
10.0000 mg | Freq: Once | INTRAMUSCULAR | Status: AC
  Administered 2023-05-27: 10 mg via INTRAVENOUS
  Filled 2023-05-27: qty 1

## 2023-05-27 MED ORDER — OXALIPLATIN CHEMO INJECTION 100 MG/20ML
85.0000 mg/m2 | Freq: Once | INTRAVENOUS | Status: AC
  Administered 2023-05-27: 150 mg via INTRAVENOUS
  Filled 2023-05-27: qty 20

## 2023-05-27 MED ORDER — SODIUM CHLORIDE 0.9 % IV SOLN
400.0000 mg/m2 | Freq: Once | INTRAVENOUS | Status: AC
  Administered 2023-05-27: 696 mg via INTRAVENOUS
  Filled 2023-05-27: qty 34.8

## 2023-05-27 MED ORDER — SODIUM CHLORIDE 0.9 % IV SOLN
150.0000 mg/m2 | Freq: Once | INTRAVENOUS | Status: AC
  Administered 2023-05-27: 260 mg via INTRAVENOUS
  Filled 2023-05-27: qty 13

## 2023-05-27 NOTE — Progress Notes (Signed)
Patient seen by Dr. Thornton Papas today  Vitals are within treatment parameters:Yes   Labs are within treatment parameters: Yes   Treatment plan has been signed: Yes   Per physician team, Patient is ready for treatment. Please note the following modifications: MD has dose reduced Leucovorin and 5FU based on weight change.

## 2023-05-27 NOTE — Progress Notes (Signed)
Called [facility] and left message w/nursing that [name] needs an appointment now with Dr. Donell Beers to discuss surgery.

## 2023-05-27 NOTE — Progress Notes (Signed)
[facility] OFFICE PROGRESS NOTE   Diagnosis: Pancreas cancer  INTERVAL HISTORY:   Randy Allen completed another cycle of FOLFIRINOX on 05/13/2023.  No nausea/vomiting or mouth sores.  He had mild diarrhea (2 times per day) over the week following chemotherapy.  He had cold sensitive for 2 weeks.  No peripheral numbness at present.  The diarrhea improved with Imodium.  He continues to have mid abdominal discomfort.  He takes oxycodone occasionally.  The pain is improved as compared to prechemotherapy.  Objective:  Vital signs in last 24 hours:  Blood pressure 104/84, pulse 77, temperature 98.1 F (36.7 C), temperature source Temporal, resp. rate 18, height 5\' 8"  (1.727 m), weight 138 lb 6.4 oz (62.8 kg), SpO2 100%.    HEENT: No thrush or ulcers Resp: Lungs clear bilaterally Cardio: Regular rate and rhythm GI: No hepatosplenomegaly, no mass, nontender Vascular: No leg edema Neuro: Moderate loss of vibratory sense at the fingertips bilaterally    Portacath/PICC-without erythema  Lab Results:  Lab Results  Component Value Date   WBC 12.0 (H) 05/27/2023   HGB 10.4 (L) 05/27/2023   HCT 31.3 (L) 05/27/2023   MCV 95.7 05/27/2023   PLT 102 (L) 05/27/2023   NEUTROABS 8.4 (H) 05/27/2023    CMP  Lab Results  Component Value Date   NA 138 05/27/2023   K 3.4 (L) 05/27/2023   CL 104 05/27/2023   CO2 28 05/27/2023   GLUCOSE 109 (H) 05/27/2023   BUN 12 05/27/2023   CREATININE 0.90 05/27/2023   CALCIUM 9.4 05/27/2023   PROT 6.1 (L) 05/27/2023   ALBUMIN 3.4 (L) 05/27/2023   AST 43 (H) 05/27/2023   ALT 37 05/27/2023   ALKPHOS 345 (H) 05/27/2023   BILITOT 0.5 05/27/2023   GFRNONAA >60 05/27/2023   GFRAA 97 07/04/2020     Medications: I have reviewed the patient's current medications.   Assessment/Plan: Pancreas cancer Elevated liver enzymes/bilirubin September 2024 CT abdomen/pelvis 01/10/2023-no focal liver abnormality, no biliary dilation, pancreas  unremarkable MRI/MRCP 01/15/2023-mild intra and extrahepatic biliary duct dilation, common duct is abruptly truncated in the superior pancreas head, hypoenhancing mass in the central pancreas head measuring 2.6 x 2.4 cm, remaining pancreas is atrophic with dilation of the main pancreatic duct, mass contacts the adjacent central SMV and portal confluence with less than 180 degrees of contact, preserved fat plane to the SMA with no enlarged abdominal lymph nodes, no ascites ERCP 01/18/2023-single stenosis in the lower third of the main bile duct, brushings obtained, plastic stent placed-adenocarcinoma EUS 02/06/2023-30 x 30 mm pancreas head mass with irregular outer margins, abutment of the portal vein and SMV with intact fat planes, intact interface between the mass and SMA and celiac trunk, common bile duct stent, no malignant appearing lymph nodes, T2 N0 Cycle 1 FOLFIRINOX 02/19/2023 Chemotherapy held 03/04/2023 due to neutropenia Cycle 2 FOLFIRINOX 03/10/2023, Fulphila Cycle 3 held 03/24/2023 due to liver function abnormalities Cycle 3 FOLFIRINOX 04/01/2023, Fulphila Cycle 4 FOLFIRINOX 04/15/2023, Fulphila Cycle 5 FOLFIRINOX 04/29/2023, Fulphila Cycle 6 FOLFIRINOX 05/13/2023, Fulphila CT abdomen/pelvis 05/19/2023: Decreased in the pancreas head mass, abnormal soft tissue adjacent to the celiac artery and posterior/inferior to the common hepatic artery, abnormal soft tissue abutting the SMV over 90 degrees, no lymphadenopathy Cycle 7 FOLFIRINOX 05/27/2023, Fulphila Abdomen/back pain secondary to #1 Bicuspid aortic valve Hypertension Antral gastritis and ulcers on EUS 02/06/2023-Prilosec Hyperglycemia Oxaliplatin neuropathy-prolonged cold sensitivity, loss of vibratory sense on exam 05/27/2023      Disposition: Randy Allen has completed  6 cycles of neoadjuvant FOLFIRINOX.  He has tolerated the chemotherapy well.  A restaging CT 05/19/2023 reveals a decrease in the pancreas head mass.  There is abnormal soft  tissue adjacent to the celiac artery/common hepatic artery and abutting the SMV.  The plan is to complete 2 more cycles FOLFIRINOX prior to resection of the pancreas head mass.  He will be scheduled for an appointment with Dr. Donell Beers to discuss surgical options.  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  We will assess his neuropathy symptoms prior to cycle 8 chemotherapy.  Thornton Papas, MD  05/27/2023  8:37 AM

## 2023-05-27 NOTE — Patient Instructions (Signed)
[facility] - A DEPT OF [place].  [facility]   The chemotherapy medication bag should finish at 46 hours, 96 hours, or 7 days. For example, if your pump is scheduled for 46 hours and it was put on at 4:00 p.m., it should finish at 2:00 p.m. the day it is scheduled to come off regardless of your appointment time.     Estimated time to finish at 12:30pm on Thursday May 15, 2023.   If the display on your pump reads "Low Volume" and it is beeping, take the batteries out of the pump and come to the cancer center for it to be taken off.   If the pump alarms go off prior to the pump reading "Low Volume" then call [phone] and someone can assist you.  If the plunger comes out and the chemotherapy medication is leaking out, please use your home chemo spill kit to clean up the spill. Do NOT use paper towels or other household products.  If you have problems or questions regarding your pump, please call either [phone] (24 hours a day) or the cancer center Monday-Friday 8:00 a.m.- 4:30 p.m. at the clinic number and we will assist you. If you are unable to get assistance, then go to the nearest Emergency Department and ask the staff to contact the IV team for assistance.  Discharge Instructions: Thank you for choosing [facility] to provide your oncology and hematology care.   If you have a lab appointment with the Cancer Center, please go directly to the Cancer Center and check in at the registration area.   Wear comfortable clothing and clothing appropriate for easy access to any Portacath or PICC line.   We strive to give you quality time with your provider. You may need to reschedule your appointment if you arrive late (15 or more minutes).  Arriving late affects you and other patients whose appointments are after yours.  Also, if you miss three or more appointments without notifying the office, you may be dismissed from the clinic at the provider's  discretion.      For prescription refill requests, have your pharmacy contact our office and allow 72 hours for refills to be completed.    Today you received the following chemotherapy and/or immunotherapy agents Oxaliplatin, Leucovorin, Irinotecan, Fluorouracil      To help prevent nausea and vomiting after your treatment, we encourage you to take your nausea medication as directed.  BELOW ARE SYMPTOMS THAT SHOULD BE REPORTED IMMEDIATELY: *FEVER GREATER THAN 100.4 F (38 C) OR HIGHER *CHILLS OR SWEATING *NAUSEA AND VOMITING THAT IS NOT CONTROLLED WITH YOUR NAUSEA MEDICATION *UNUSUAL SHORTNESS OF BREATH *UNUSUAL BRUISING OR BLEEDING *URINARY PROBLEMS (pain or burning when urinating, or frequent urination) *BOWEL PROBLEMS (unusual diarrhea, constipation, pain near the anus) TENDERNESS IN MOUTH AND THROAT WITH OR WITHOUT PRESENCE OF ULCERS (sore throat, sores in mouth, or a toothache) UNUSUAL RASH, SWELLING OR PAIN  UNUSUAL VAGINAL DISCHARGE OR ITCHING   Items with * indicate a potential emergency and should be followed up as soon as possible or go to the Emergency Department if any problems should occur.  Please show the CHEMOTHERAPY ALERT CARD or IMMUNOTHERAPY ALERT CARD at check-in to the Emergency Department and triage nurse.  Should you have questions after your visit or need to cancel or reschedule your appointment, please contact [facility] - A DEPT OF [facility]  Dept: [phone]  and follow the prompts.  Office hours are 8:00 a.m. to 4:30 p.m. Monday - Friday. Please note that voicemails left after 4:00 p.m. may not be returned until the following business day.  We are closed weekends and major holidays. You have access to a nurse at all times for urgent questions. Please call the main number to the clinic Dept: [phone] and follow the prompts.   For any non-urgent questions, you may also contact your provider using MyChart. We now offer  e-Visits for anyone 19 and older to request care online for non-urgent symptoms. For details visit [email or web address].   Also download the MyChart app! Go to the app store, search "MyChart", open the app, select [place], and log in with your MyChart username and password.  Oxaliplatin Injection What is this medication? OXALIPLATIN (ox AL i PLA tin) treats colorectal cancer. It works by slowing down the growth of cancer cells. This medicine may be used for other purposes; ask your health care provider or pharmacist if you have questions. COMMON BRAND NAME(S): Eloxatin What should I tell my care team before I take this medication? They need to know if you have any of these conditions: Heart disease History of irregular heartbeat or rhythm Liver disease Low blood cell levels (white cells, red cells, and platelets) Lung or breathing disease, such as asthma Take medications that treat or prevent blood clots Tingling of the fingers, toes, or other nerve disorder An unusual or allergic reaction to oxaliplatin, other medications, foods, dyes, or preservatives If you or your partner are pregnant or trying to get pregnant Breast-feeding How should I use this medication? This medication is injected into a vein. It is given by your care team in a hospital or clinic setting. Talk to your care team about the use of this medication in children. Special care may be needed. Overdosage: If you think you have taken too much of this medicine contact a poison control center or emergency room at once. NOTE: This medicine is only for you. Do not share this medicine with others. What if I miss a dose? Keep appointments for follow-up doses. It is important not to miss a dose. Call your care team if you are unable to keep an appointment. What may interact with this medication? Do not take this medication with any of the following: Cisapride Dronedarone Pimozide Thioridazine This medication may also  interact with the following: Aspirin and aspirin-like medications Certain medications that treat or prevent blood clots, such as warfarin, apixaban, dabigatran, and rivaroxaban Cisplatin Cyclosporine Diuretics Medications for infection, such as acyclovir, adefovir, amphotericin B, bacitracin, cidofovir, foscarnet, ganciclovir, gentamicin, pentamidine, vancomycin NSAIDs, medications for pain and inflammation, such as ibuprofen or naproxen Other medications that cause heart rhythm changes Pamidronate Zoledronic acid This list may not describe all possible interactions. Give your health care provider a list of all the medicines, herbs, non-prescription drugs, or dietary supplements you use. Also tell them if you smoke, drink alcohol, or use illegal drugs. Some items may interact with your medicine. What should I watch for while using this medication? Your condition will be monitored carefully while you are receiving this medication. You may need blood work while taking this medication. This medication may make you feel generally unwell. This is not uncommon as chemotherapy can affect healthy cells as well as cancer cells. Report any side effects. Continue your course of treatment even though you feel ill unless your care team tells you to stop. This medication may increase your risk of getting an infection. Call your  care team for advice if you get a fever, chills, sore throat, or other symptoms of a cold or flu. Do not treat yourself. Try to avoid being around people who are sick. Avoid taking medications that contain aspirin, acetaminophen, ibuprofen, naproxen, or ketoprofen unless instructed by your care team. These medications may hide a fever. Be careful brushing or flossing your teeth or using a toothpick because you may get an infection or bleed more easily. If you have any dental work done, tell your dentist you are receiving this medication. This medication can make you more sensitive to  cold. Do not drink cold drinks or use ice. Cover exposed skin before coming in contact with cold temperatures or cold objects. When out in cold weather wear warm clothing and cover your mouth and nose to warm the air that goes into your lungs. Tell your care team if you get sensitive to the cold. Talk to your care team if you or your partner are pregnant or think either of you might be pregnant. This medication can cause serious birth defects if taken during pregnancy and for 9 months after the last dose. A negative pregnancy test is required before starting this medication. A reliable form of contraception is recommended while taking this medication and for 9 months after the last dose. Talk to your care team about effective forms of contraception. Do not father a child while taking this medication and for 6 months after the last dose. Use a condom while having sex during this time period. Do not breastfeed while taking this medication and for 3 months after the last dose. This medication may cause infertility. Talk to your care team if you are concerned about your fertility. What side effects may I notice from receiving this medication? Side effects that you should report to your care team as soon as possible: Allergic reactions--skin rash, itching, hives, swelling of the face, lips, tongue, or throat Bleeding--bloody or black, tar-like stools, vomiting blood or brown material that looks like coffee grounds, red or dark brown urine, small red or purple spots on skin, unusual bruising or bleeding Dry cough, shortness of breath or trouble breathing Heart rhythm changes--fast or irregular heartbeat, dizziness, feeling faint or lightheaded, chest pain, trouble breathing Infection--fever, chills, cough, sore throat, wounds that don't heal, pain or trouble when passing urine, general feeling of discomfort or being unwell Liver injury--right upper belly pain, loss of appetite, nausea, light-colored stool, dark  yellow or brown urine, yellowing skin or eyes, unusual weakness or fatigue Low red blood cell level--unusual weakness or fatigue, dizziness, headache, trouble breathing Muscle injury--unusual weakness or fatigue, muscle pain, dark yellow or brown urine, decrease in amount of urine Pain, tingling, or numbness in the hands or feet Sudden and severe headache, confusion, change in vision, seizures, which may be signs of posterior reversible encephalopathy syndrome (PRES) Unusual bruising or bleeding Side effects that usually do not require medical attention (report to your care team if they continue or are bothersome): Diarrhea Nausea Pain, redness, or swelling with sores inside the mouth or throat Unusual weakness or fatigue Vomiting This list may not describe all possible side effects. Call your doctor for medical advice about side effects. You may report side effects to FDA at 1-800-FDA-1088. Where should I keep my medication? This medication is given in a hospital or clinic. It will not be stored at home. NOTE: This sheet is a summary. It may not cover all possible information. If you have questions about this medicine,  talk to your doctor, pharmacist, or health care provider.  2024 Elsevier/Gold Standard (2022-01-15 00:00:00) Leucovorin Injection What is this medication? LEUCOVORIN (loo koe VOR in) prevents side effects from certain medications, such as methotrexate. It works by increasing folate levels. This helps protect healthy cells in your body. It may also be used to treat anemia caused by low levels of folate. It can also be used with fluorouracil, a type of chemotherapy, to treat colorectal cancer. It works by increasing the effects of fluorouracil in the body. This medicine may be used for other purposes; ask your health care provider or pharmacist if you have questions. What should I tell my care team before I take this medication? They need to know if you have any of these  conditions: Anemia from low levels of vitamin B12 in the blood An unusual or allergic reaction to leucovorin, folic acid, other medications, foods, dyes, or preservatives Pregnant or trying to get pregnant Breastfeeding How should I use this medication? This medication is injected into a vein or a muscle. It is given by your care team in a hospital or clinic setting. Talk to your care team about the use of this medication in children. Special care may be needed. Overdosage: If you think you have taken too much of this medicine contact a poison control center or emergency room at once. NOTE: This medicine is only for you. Do not share this medicine with others. What if I miss a dose? Keep appointments for follow-up doses. It is important not to miss your dose. Call your care team if you are unable to keep an appointment. What may interact with this medication? Capecitabine Fluorouracil Phenobarbital Phenytoin Primidone Trimethoprim;sulfamethoxazole This list may not describe all possible interactions. Give your health care provider a list of all the medicines, herbs, non-prescription drugs, or dietary supplements you use. Also tell them if you smoke, drink alcohol, or use illegal drugs. Some items may interact with your medicine. What should I watch for while using this medication? Your condition will be monitored carefully while you are receiving this medication. This medication may increase the side effects of 5-fluorouracil. Tell your care team if you have diarrhea or mouth sores that do not get better or that get worse. What side effects may I notice from receiving this medication? Side effects that you should report to your care team as soon as possible: Allergic reactions--skin rash, itching, hives, swelling of the face, lips, tongue, or throat This list may not describe all possible side effects. Call your doctor for medical advice about side effects. You may report side effects to  FDA at 1-800-FDA-1088. Where should I keep my medication? This medication is given in a hospital or clinic. It will not be stored at home. NOTE: This sheet is a summary. It may not cover all possible information. If you have questions about this medicine, talk to your doctor, pharmacist, or health care provider.  2024 Elsevier/Gold Standard (2021-09-04 00:00:00) Irinotecan Injection What is this medication? IRINOTECAN (ir in oh TEE kan) treats some types of cancer. It works by slowing down the growth of cancer cells. This medicine may be used for other purposes; ask your health care provider or pharmacist if you have questions. COMMON BRAND NAME(S): Camptosar What should I tell my care team before I take this medication? They need to know if you have any of these conditions: Dehydration Diarrhea Infection, especially a viral infection, such as chickenpox, cold sores, herpes Liver disease Low blood cell levels (  white cells, red cells, and platelets) Low levels of electrolytes, such as calcium, magnesium, or potassium in your blood Recent or ongoing radiation An unusual or allergic reaction to irinotecan, other medications, foods, dyes, or preservatives If you or your partner are pregnant or trying to get pregnant Breast-feeding How should I use this medication? This medication is injected into a vein. It is given by your care team in a hospital or clinic setting. Talk to your care team about the use of this medication in children. Special care may be needed. Overdosage: If you think you have taken too much of this medicine contact a poison control center or emergency room at once. NOTE: This medicine is only for you. Do not share this medicine with others. What if I miss a dose? Keep appointments for follow-up doses. It is important not to miss your dose. Call your care team if you are unable to keep an appointment. What may interact with this medication? Do not take this medication  with any of the following: Cobicistat Itraconazole This medication may also interact with the following: Certain antibiotics, such as clarithromycin, rifampin, rifabutin Certain antivirals for HIV or AIDS Certain medications for fungal infections, such as ketoconazole, posaconazole, voriconazole Certain medications for seizures, such as carbamazepine, phenobarbital, phenytoin Gemfibrozil Nefazodone St. John's wort This list may not describe all possible interactions. Give your health care provider a list of all the medicines, herbs, non-prescription drugs, or dietary supplements you use. Also tell them if you smoke, drink alcohol, or use illegal drugs. Some items may interact with your medicine. What should I watch for while using this medication? Your condition will be monitored carefully while you are receiving this medication. You may need blood work while taking this medication. This medication may make you feel generally unwell. This is not uncommon as chemotherapy can affect healthy cells as well as cancer cells. Report any side effects. Continue your course of treatment even though you feel ill unless your care team tells you to stop. This medication can cause serious side effects. To reduce the risk, your care team may give you other medications to take before receiving this one. Be sure to follow the directions from your care team. This medication may affect your coordination, reaction time, or judgement. Do not drive or operate machinery until you know how this medication affects you. Sit up or stand slowly to reduce the risk of dizzy or fainting spells. Drinking alcohol with this medication can increase the risk of these side effects. This medication may increase your risk of getting an infection. Call your care team for advice if you get a fever, chills, sore throat, or other symptoms of a cold or flu. Do not treat yourself. Try to avoid being around people who are sick. Avoid taking  medications that contain aspirin, acetaminophen, ibuprofen, naproxen, or ketoprofen unless instructed by your care team. These medications may hide a fever. This medication may increase your risk to bruise or bleed. Call your care team if you notice any unusual bleeding. Be careful brushing or flossing your teeth or using a toothpick because you may get an infection or bleed more easily. If you have any dental work done, tell your dentist you are receiving this medication. Talk to your care team if you or your partner are pregnant or think either of you might be pregnant. This medication can cause serious birth defects if taken during pregnancy and for 6 months after the last dose. You will need a negative  pregnancy test before starting this medication. Contraception is recommended while taking this medication and for 6 months after the last dose. Your care team can help you find the option that works for you. Do not father a child while taking this medication and for 3 months after the last dose. Use a condom for contraception during this time period. Do not breastfeed while taking this medication and for 7 days after the last dose. This medication may cause infertility. Talk to your care team if you are concerned about your fertility. What side effects may I notice from receiving this medication? Side effects that you should report to your care team as soon as possible: Allergic reactions--skin rash, itching, hives, swelling of the face, lips, tongue, or throat Dry cough, shortness of breath or trouble breathing Increased saliva or tears, increased sweating, stomach cramping, diarrhea, small pupils, unusual weakness or fatigue, slow heartbeat Infection--fever, chills, cough, sore throat, wounds that don't heal, pain or trouble when passing urine, general feeling of discomfort or being unwell Kidney injury--decrease in the amount of urine, swelling of the ankles, hands, or feet Low red blood cell  level--unusual weakness or fatigue, dizziness, headache, trouble breathing Severe or prolonged diarrhea Unusual bruising or bleeding Side effects that usually do not require medical attention (report to your care team if they continue or are bothersome): Constipation Diarrhea Hair loss Loss of appetite Nausea Stomach pain This list may not describe all possible side effects. Call your doctor for medical advice about side effects. You may report side effects to FDA at 1-800-FDA-1088. Where should I keep my medication? This medication is given in a hospital or clinic. It will not be stored at home. NOTE: This sheet is a summary. It may not cover all possible information. If you have questions about this medicine, talk to your doctor, pharmacist, or health care provider.  2024 Elsevier/Gold Standard (2021-08-13 00:00:00) Fluorouracil Injection What is this medication? FLUOROURACIL (flure oh YOOR a sil) treats some types of cancer. It works by slowing down the growth of cancer cells. This medicine may be used for other purposes; ask your health care provider or pharmacist if you have questions. COMMON BRAND NAME(S): Adrucil What should I tell my care team before I take this medication? They need to know if you have any of these conditions: Blood disorders Dihydropyrimidine dehydrogenase (DPD) deficiency Infection, such as chickenpox, cold sores, herpes Kidney disease Liver disease Poor nutrition Recent or ongoing radiation therapy An unusual or allergic reaction to fluorouracil, other medications, foods, dyes, or preservatives If you or your partner are pregnant or trying to get pregnant Breast-feeding How should I use this medication? This medication is injected into a vein. It is administered by your care team in a hospital or clinic setting. Talk to your care team about the use of this medication in children. Special care may be needed. Overdosage: If you think you have taken too  much of this medicine contact a poison control center or emergency room at once. NOTE: This medicine is only for you. Do not share this medicine with others. What if I miss a dose? Keep appointments for follow-up doses. It is important not to miss your dose. Call your care team if you are unable to keep an appointment. What may interact with this medication? Do not take this medication with any of the following: Live virus vaccines This medication may also interact with the following: Medications that treat or prevent blood clots, such as warfarin, enoxaparin, dalteparin This  list may not describe all possible interactions. Give your health care provider a list of all the medicines, herbs, non-prescription drugs, or dietary supplements you use. Also tell them if you smoke, drink alcohol, or use illegal drugs. Some items may interact with your medicine. What should I watch for while using this medication? Your condition will be monitored carefully while you are receiving this medication. This medication may make you feel generally unwell. This is not uncommon as chemotherapy can affect healthy cells as well as cancer cells. Report any side effects. Continue your course of treatment even though you feel ill unless your care team tells you to stop. In some cases, you may be given additional medications to help with side effects. Follow all directions for their use. This medication may increase your risk of getting an infection. Call your care team for advice if you get a fever, chills, sore throat, or other symptoms of a cold or flu. Do not treat yourself. Try to avoid being around people who are sick. This medication may increase your risk to bruise or bleed. Call your care team if you notice any unusual bleeding. Be careful brushing or flossing your teeth or using a toothpick because you may get an infection or bleed more easily. If you have any dental work done, tell your dentist you are receiving  this medication. Avoid taking medications that contain aspirin, acetaminophen, ibuprofen, naproxen, or ketoprofen unless instructed by your care team. These medications may hide a fever. Do not treat diarrhea with over the counter products. Contact your care team if you have diarrhea that lasts more than 2 days or if it is severe and watery. This medication can make you more sensitive to the sun. Keep out of the sun. If you cannot avoid being in the sun, wear protective clothing and sunscreen. Do not use sun lamps, tanning beds, or tanning booths. Talk to your care team if you or your partner wish to become pregnant or think you might be pregnant. This medication can cause serious birth defects if taken during pregnancy and for 3 months after the last dose. A reliable form of contraception is recommended while taking this medication and for 3 months after the last dose. Talk to your care team about effective forms of contraception. Do not father a child while taking this medication and for 3 months after the last dose. Use a condom while having sex during this time period. Do not breastfeed while taking this medication. This medication may cause infertility. Talk to your care team if you are concerned about your fertility. What side effects may I notice from receiving this medication? Side effects that you should report to your care team as soon as possible: Allergic reactions--skin rash, itching, hives, swelling of the face, lips, tongue, or throat Heart attack--pain or tightness in the chest, shoulders, arms, or jaw, nausea, shortness of breath, cold or clammy skin, feeling faint or lightheaded Heart failure--shortness of breath, swelling of the ankles, feet, or hands, sudden weight gain, unusual weakness or fatigue Heart rhythm changes--fast or irregular heartbeat, dizziness, feeling faint or lightheaded, chest pain, trouble breathing High ammonia level--unusual weakness or fatigue, confusion, loss  of appetite, nausea, vomiting, seizures Infection--fever, chills, cough, sore throat, wounds that don't heal, pain or trouble when passing urine, general feeling of discomfort or being unwell Low red blood cell level--unusual weakness or fatigue, dizziness, headache, trouble breathing Pain, tingling, or numbness in the hands or feet, muscle weakness, change in vision, confusion or  trouble speaking, loss of balance or coordination, trouble walking, seizures Redness, swelling, and blistering of the skin over hands and feet Severe or prolonged diarrhea Unusual bruising or bleeding Side effects that usually do not require medical attention (report to your care team if they continue or are bothersome): Dry skin Headache Increased tears Nausea Pain, redness, or swelling with sores inside the mouth or throat Sensitivity to light Vomiting This list may not describe all possible side effects. Call your doctor for medical advice about side effects. You may report side effects to FDA at 1-800-FDA-1088. Where should I keep my medication? This medication is given in a hospital or clinic. It will not be stored at home. NOTE: This sheet is a summary. It may not cover all possible information. If you have questions about this medicine, talk to your doctor, pharmacist, or health care provider.  2024 [employer]/Gold Standard (2021-08-07 00:00:00)

## 2023-05-29 ENCOUNTER — Inpatient Hospital Stay: Payer: BC Managed Care – PPO

## 2023-05-29 ENCOUNTER — Telehealth: Payer: Self-pay | Admitting: *Deleted

## 2023-05-29 ENCOUNTER — Encounter: Payer: Self-pay | Admitting: *Deleted

## 2023-05-29 VITALS — BP 116/88 | HR 77 | Temp 98.0°F | Resp 18

## 2023-05-29 DIAGNOSIS — Z5111 Encounter for antineoplastic chemotherapy: Secondary | ICD-10-CM | POA: Diagnosis not present

## 2023-05-29 DIAGNOSIS — C250 Malignant neoplasm of head of pancreas: Secondary | ICD-10-CM | POA: Diagnosis not present

## 2023-05-29 DIAGNOSIS — R197 Diarrhea, unspecified: Secondary | ICD-10-CM | POA: Diagnosis not present

## 2023-05-29 DIAGNOSIS — Q2381 Bicuspid aortic valve: Secondary | ICD-10-CM | POA: Diagnosis not present

## 2023-05-29 DIAGNOSIS — G620 Drug-induced polyneuropathy: Secondary | ICD-10-CM | POA: Diagnosis not present

## 2023-05-29 DIAGNOSIS — I10 Essential (primary) hypertension: Secondary | ICD-10-CM | POA: Diagnosis not present

## 2023-05-29 DIAGNOSIS — Z452 Encounter for adjustment and management of vascular access device: Secondary | ICD-10-CM | POA: Diagnosis not present

## 2023-05-29 DIAGNOSIS — Z5189 Encounter for other specified aftercare: Secondary | ICD-10-CM | POA: Diagnosis not present

## 2023-05-29 MED ORDER — SODIUM CHLORIDE 0.9% FLUSH
10.0000 mL | INTRAVENOUS | Status: DC | PRN
Start: 2023-05-29 — End: 2023-05-29
  Administered 2023-05-29: 10 mL

## 2023-05-29 MED ORDER — HEPARIN SOD (PORK) LOCK FLUSH 100 UNIT/ML IV SOLN
500.0000 [IU] | Freq: Once | INTRAVENOUS | Status: AC | PRN
  Administered 2023-05-29: 500 [IU]

## 2023-05-29 MED ORDER — PEGFILGRASTIM-JMDB 6 MG/0.6ML ~~LOC~~ SOSY
6.0000 mg | PREFILLED_SYRINGE | Freq: Once | SUBCUTANEOUS | Status: AC
  Administered 2023-05-29: 6 mg via SUBCUTANEOUS
  Filled 2023-05-29: qty 0.6

## 2023-05-29 NOTE — Progress Notes (Signed)
PATIENT NAVIGATOR PROGRESS NOTE  Name: Randy Allen Date: 05/29/2023 MRN: [id number]  DOB: 1961/04/10   Reason for visit:  F/U visit with Dr Donell Beers  Comments:  Called and spoke with [name] this and they stated that he has an appt with Dr Donell Beers on 2/21 at 0900  to discuss surgical options    Time spent counseling/coordinating care: 30-45 minutes

## 2023-05-29 NOTE — Patient Instructions (Signed)

## 2023-05-29 NOTE — Telephone Encounter (Signed)
Dr Donell Beers appt has been moved to 2/18

## 2023-05-30 DIAGNOSIS — D1801 Hemangioma of skin and subcutaneous tissue: Secondary | ICD-10-CM | POA: Diagnosis not present

## 2023-05-30 DIAGNOSIS — L821 Other seborrheic keratosis: Secondary | ICD-10-CM | POA: Diagnosis not present

## 2023-05-30 DIAGNOSIS — L820 Inflamed seborrheic keratosis: Secondary | ICD-10-CM | POA: Diagnosis not present

## 2023-05-30 DIAGNOSIS — D485 Neoplasm of uncertain behavior of skin: Secondary | ICD-10-CM | POA: Diagnosis not present

## 2023-05-30 DIAGNOSIS — D225 Melanocytic nevi of trunk: Secondary | ICD-10-CM | POA: Diagnosis not present

## 2023-05-30 DIAGNOSIS — D2271 Melanocytic nevi of right lower limb, including hip: Secondary | ICD-10-CM | POA: Diagnosis not present

## 2023-06-03 ENCOUNTER — Other Ambulatory Visit: Payer: Self-pay | Admitting: General Surgery

## 2023-06-03 DIAGNOSIS — C250 Malignant neoplasm of head of pancreas: Secondary | ICD-10-CM | POA: Diagnosis not present

## 2023-06-03 DIAGNOSIS — E43 Unspecified severe protein-calorie malnutrition: Secondary | ICD-10-CM | POA: Diagnosis not present

## 2023-06-05 ENCOUNTER — Other Ambulatory Visit: Payer: Self-pay | Admitting: Oncology

## 2023-06-05 DIAGNOSIS — C250 Malignant neoplasm of head of pancreas: Secondary | ICD-10-CM

## 2023-06-09 ENCOUNTER — Ambulatory Visit: Payer: BC Managed Care – PPO | Admitting: Nurse Practitioner

## 2023-06-10 ENCOUNTER — Inpatient Hospital Stay: Payer: BC Managed Care – PPO

## 2023-06-10 ENCOUNTER — Inpatient Hospital Stay (HOSPITAL_BASED_OUTPATIENT_CLINIC_OR_DEPARTMENT_OTHER): Payer: BC Managed Care – PPO | Admitting: Oncology

## 2023-06-10 VITALS — BP 118/78 | HR 65 | Temp 98.2°F | Resp 16

## 2023-06-10 VITALS — BP 107/70 | HR 72 | Temp 98.2°F | Resp 18 | Ht 68.0 in | Wt 134.8 lb

## 2023-06-10 DIAGNOSIS — C250 Malignant neoplasm of head of pancreas: Secondary | ICD-10-CM | POA: Diagnosis not present

## 2023-06-10 DIAGNOSIS — Z452 Encounter for adjustment and management of vascular access device: Secondary | ICD-10-CM | POA: Diagnosis not present

## 2023-06-10 DIAGNOSIS — Z5111 Encounter for antineoplastic chemotherapy: Secondary | ICD-10-CM | POA: Diagnosis not present

## 2023-06-10 DIAGNOSIS — Z5189 Encounter for other specified aftercare: Secondary | ICD-10-CM | POA: Diagnosis not present

## 2023-06-10 DIAGNOSIS — Q2381 Bicuspid aortic valve: Secondary | ICD-10-CM | POA: Diagnosis not present

## 2023-06-10 DIAGNOSIS — I10 Essential (primary) hypertension: Secondary | ICD-10-CM | POA: Diagnosis not present

## 2023-06-10 DIAGNOSIS — R197 Diarrhea, unspecified: Secondary | ICD-10-CM | POA: Diagnosis not present

## 2023-06-10 DIAGNOSIS — G620 Drug-induced polyneuropathy: Secondary | ICD-10-CM | POA: Diagnosis not present

## 2023-06-10 LAB — CMP (CANCER CENTER ONLY)
ALT: 40 U/L (ref 0–44)
AST: 43 U/L — ABNORMAL HIGH (ref 15–41)
Albumin: 3.6 g/dL (ref 3.5–5.0)
Alkaline Phosphatase: 393 U/L — ABNORMAL HIGH (ref 38–126)
Anion gap: 7 (ref 5–15)
BUN: 8 mg/dL (ref 8–23)
CO2: 29 mmol/L (ref 22–32)
Calcium: 9 mg/dL (ref 8.9–10.3)
Chloride: 105 mmol/L (ref 98–111)
Creatinine: 0.85 mg/dL (ref 0.61–1.24)
GFR, Estimated: >60 mL/min (ref >60)
Glucose, Bld: 117 mg/dL — ABNORMAL HIGH (ref 70–99)
Potassium: 3.3 mmol/L — ABNORMAL LOW (ref 3.5–5.1)
Sodium: 141 mmol/L (ref 135–145)
Total Bilirubin: 0.4 mg/dL (ref 0.0–1.2)
Total Protein: 6.1 g/dL — ABNORMAL LOW (ref 6.5–8.1)

## 2023-06-10 LAB — CBC WITH DIFFERENTIAL (CANCER CENTER ONLY)
Abs Immature Granulocytes: 0.78 10*3/uL — ABNORMAL HIGH (ref 0.00–0.07)
Basophils Absolute: 0 10*3/uL (ref 0.0–0.1)
Basophils Relative: 0 %
Eosinophils Absolute: 0 10*3/uL (ref 0.0–0.5)
Eosinophils Relative: 1 %
HCT: 27.2 % — ABNORMAL LOW (ref 39.0–52.0)
Hemoglobin: 9.2 g/dL — ABNORMAL LOW (ref 13.0–17.0)
Immature Granulocytes: 10 %
Lymphocytes Relative: 18 %
Lymphs Abs: 1.4 10*3/uL (ref 0.7–4.0)
MCH: 33.1 pg (ref 26.0–34.0)
MCHC: 33.8 g/dL (ref 30.0–36.0)
MCV: 97.8 fL (ref 80.0–100.0)
Monocytes Absolute: 0.8 10*3/uL (ref 0.1–1.0)
Monocytes Relative: 10 %
Neutro Abs: 4.9 10*3/uL (ref 1.7–7.7)
Neutrophils Relative %: 61 %
Platelet Count: 109 10*3/uL — ABNORMAL LOW (ref 150–400)
RBC: 2.78 MIL/uL — ABNORMAL LOW (ref 4.22–5.81)
RDW: 18.6 % — ABNORMAL HIGH (ref 11.5–15.5)
Smear Review: ADEQUATE
WBC Count: 7.8 10*3/uL (ref 4.0–10.5)
nRBC: 0.3 % — ABNORMAL HIGH (ref 0.0–0.2)

## 2023-06-10 MED ORDER — SODIUM CHLORIDE 0.9 % IV SOLN
400.0000 mg/m2 | Freq: Once | INTRAVENOUS | Status: AC
  Administered 2023-06-10: 684 mg via INTRAVENOUS
  Filled 2023-06-10: qty 34.2

## 2023-06-10 MED ORDER — SODIUM CHLORIDE 0.9 % IV SOLN
2400.0000 mg/m2 | INTRAVENOUS | Status: DC
  Administered 2023-06-10: 4100 mg via INTRAVENOUS
  Filled 2023-06-10: qty 82

## 2023-06-10 MED ORDER — SODIUM CHLORIDE 0.9 % IV SOLN
150.0000 mg | Freq: Once | INTRAVENOUS | Status: AC
  Administered 2023-06-10: 150 mg via INTRAVENOUS
  Filled 2023-06-10: qty 150

## 2023-06-10 MED ORDER — DEXAMETHASONE SODIUM PHOSPHATE 10 MG/ML IJ SOLN
10.0000 mg | Freq: Once | INTRAMUSCULAR | Status: AC
  Administered 2023-06-10: 10 mg via INTRAVENOUS
  Filled 2023-06-10: qty 1

## 2023-06-10 MED ORDER — OXALIPLATIN CHEMO INJECTION 100 MG/20ML
85.0000 mg/m2 | Freq: Once | INTRAVENOUS | Status: AC
  Administered 2023-06-10: 150 mg via INTRAVENOUS
  Filled 2023-06-10: qty 20

## 2023-06-10 MED ORDER — PALONOSETRON HCL INJECTION 0.25 MG/5ML
0.2500 mg | Freq: Once | INTRAVENOUS | Status: AC
  Administered 2023-06-10: 0.25 mg via INTRAVENOUS
  Filled 2023-06-10: qty 5

## 2023-06-10 MED ORDER — SODIUM CHLORIDE 0.9 % IV SOLN
150.0000 mg/m2 | Freq: Once | INTRAVENOUS | Status: AC
  Administered 2023-06-10: 260 mg via INTRAVENOUS
  Filled 2023-06-10: qty 13

## 2023-06-10 MED ORDER — DEXTROSE 5 % IV SOLN: INTRAVENOUS | Status: DC

## 2023-06-10 MED ORDER — ATROPINE SULFATE 1 MG/ML IV SOLN
0.5000 mg | Freq: Once | INTRAVENOUS | Status: AC | PRN
  Administered 2023-06-10: 0.5 mg via INTRAVENOUS
  Filled 2023-06-10: qty 1

## 2023-06-10 NOTE — Progress Notes (Signed)
 Patient presents today for chemotherapy/immunotherapy infusion of oxaliplatin, leucovorin, irinotecan, and adrucil. Patient is in satisfactory condition with no new complaints voiced.  Vital signs are stable.  Labs reviewed by Dr.Sherrill during the office visit and all labs are within treatment parameters.  We will proceed with treatment per MD orders.   Patient tolerated treatment well with no complaints voiced.  Patient left ambulatory in stable condition.  Vital signs stable at discharge.  Follow up as scheduled.

## 2023-06-10 NOTE — Progress Notes (Signed)
 Patient seen by Dr. Thornton Papas today  Vitals are within treatment parameters:Yes   Labs are within treatment parameters: Yes  Verbal ANC 4.9; OK to proceed w/K+ 3.3  Treatment plan has been signed: Yes   Per physician team, Patient is ready for treatment and there are NO modifications to the treatment plan.

## 2023-06-10 NOTE — Progress Notes (Signed)
 [facility] OFFICE PROGRESS NOTE   Diagnosis: Pancreas cancer  INTERVAL HISTORY:   Randy Allen returns as scheduled.  He completed another cycle of FOLFIRINOX on 05/27/2023.  He had diarrhea over the week following chemotherapy.  Imodium helped.  He reports cold sensitivity following chemotherapy.  This has resolved.  No peripheral neuropathy symptoms at present.  He has difficulty swallowing certain "textures "of food.  No difficulty with liquids.  He took 3 oxycodone tablets over the past few weeks.  He saw Dr. Donell Beers and is scheduled for the pancreaticoduodenectomy on 07/02/2023.  Objective:  Vital signs in last 24 hours:  Blood pressure 107/70, pulse 72, temperature 98.2 F (36.8 C), temperature source Temporal, resp. rate 18, height 5\' 8"  (1.727 m), weight 134 lb 12.8 oz (61.1 kg), SpO2 100%.    HEENT: No thrush or ulcers Resp: Clear bilaterally Cardio: Regular rate and rhythm GI: No hepatosplenomegaly, no mass, nontender Vascular: No leg edema Neuro: Mild to moderate loss of vibratory sense at the fingertips bilaterally Lymph nodes: No cervical, supraclavicular, axillary, or inguinal nodes  Portacath/PICC-without erythema  Lab Results:  Lab Results  Component Value Date   WBC 12.0 (H) 05/27/2023   HGB 10.4 (L) 05/27/2023   HCT 31.3 (L) 05/27/2023   MCV 95.7 05/27/2023   PLT 102 (L) 05/27/2023   NEUTROABS 8.4 (H) 05/27/2023    CMP  Lab Results  Component Value Date   NA 138 05/27/2023   K 3.4 (L) 05/27/2023   CL 104 05/27/2023   CO2 28 05/27/2023   GLUCOSE 109 (H) 05/27/2023   BUN 12 05/27/2023   CREATININE 0.90 05/27/2023   CALCIUM 9.4 05/27/2023   PROT 6.1 (L) 05/27/2023   ALBUMIN 3.4 (L) 05/27/2023   AST 43 (H) 05/27/2023   ALT 37 05/27/2023   ALKPHOS 345 (H) 05/27/2023   BILITOT 0.5 05/27/2023   GFRNONAA >60 05/27/2023   GFRAA 97 07/04/2020      Medications: I have reviewed the patient's current  medications.   Assessment/Plan: Pancreas cancer Elevated liver enzymes/bilirubin September 2024 CT abdomen/pelvis 01/10/2023-no focal liver abnormality, no biliary dilation, pancreas unremarkable MRI/MRCP 01/15/2023-mild intra and extrahepatic biliary duct dilation, common duct is abruptly truncated in the superior pancreas head, hypoenhancing mass in the central pancreas head measuring 2.6 x 2.4 cm, remaining pancreas is atrophic with dilation of the main pancreatic duct, mass contacts the adjacent central SMV and portal confluence with less than 180 degrees of contact, preserved fat plane to the SMA with no enlarged abdominal lymph nodes, no ascites ERCP 01/18/2023-single stenosis in the lower third of the main bile duct, brushings obtained, plastic stent placed-adenocarcinoma EUS 02/06/2023-30 x 30 mm pancreas head mass with irregular outer margins, abutment of the portal vein and SMV with intact fat planes, intact interface between the mass and SMA and celiac trunk, common bile duct stent, no malignant appearing lymph nodes, T2 N0 Cycle 1 FOLFIRINOX 02/19/2023 Chemotherapy held 03/04/2023 due to neutropenia Cycle 2 FOLFIRINOX 03/10/2023, Fulphila Cycle 3 held 03/24/2023 due to liver function abnormalities Cycle 3 FOLFIRINOX 04/01/2023, Fulphila Cycle 4 FOLFIRINOX 04/15/2023, Fulphila Cycle 5 FOLFIRINOX 04/29/2023, Fulphila Cycle 6 FOLFIRINOX 05/13/2023, Fulphila CT abdomen/pelvis 05/19/2023: Decreased in the pancreas head mass, abnormal soft tissue adjacent to the celiac artery and posterior/inferior to the common hepatic artery, abnormal soft tissue abutting the SMV over 90 degrees, no lymphadenopathy Cycle 7 FOLFIRINOX 05/27/2023, Fulphila Cycle 8 FOLFIRINOX 06/10/2023, Fila Abdomen/back pain secondary to #1 Bicuspid aortic valve Hypertension Antral gastritis and ulcers  on EUS 02/06/2023-Prilosec Hyperglycemia Oxaliplatin neuropathy-prolonged cold sensitivity, 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  He has mild symptoms of oxaliplatin neuropathy.  He will complete cycle 8 chemotherapy today.  He will call for spontaneous bleeding or bruising.  Randy Allen will return for an office and lab visit in 2 weeks.  He is scheduled for surgery with Dr. Donell Beers next month.  Thornton Papas, MD  06/10/2023  8:24 AM

## 2023-06-10 NOTE — Patient Instructions (Signed)
 [facility] A DEPT OF [facility]  Discharge Instructions: Thank you for choosing [facility] to provide your oncology and hematology care.   If you have a lab appointment with the Cancer Center, please go directly to the Cancer Center and check in at the registration area.   Wear comfortable clothing and clothing appropriate for easy access to any Portacath or PICC line.   We strive to give you quality time with your provider. You may need to reschedule your appointment if you arrive late (15 or more minutes).  Arriving late affects you and other patients whose appointments are after yours.  Also, if you miss three or more appointments without notifying the office, you may be dismissed from the clinic at the provider's discretion.      For prescription refill requests, have your pharmacy contact our office and allow 72 hours for refills to be completed.    Today you received the following chemotherapy and/or immunotherapy agents Oxaliplatin, leucovorin, irinotecan, and adrucil.  Oxaliplatin Injection What is this medication? OXALIPLATIN (ox AL i PLA tin) treats colorectal cancer. It works by slowing down the growth of cancer cells. This medicine may be used for other purposes; ask your health care provider or pharmacist if you have questions. COMMON BRAND NAME(S): Eloxatin What should I tell my care team before I take this medication? They need to know if you have any of these conditions: Heart disease History of irregular heartbeat or rhythm Liver disease Low blood cell levels (white cells, red cells, and platelets) Lung or breathing disease, such as asthma Take medications that treat or prevent blood clots Tingling of the fingers, toes, or other nerve disorder An unusual or allergic reaction to oxaliplatin, other medications, foods, dyes, or preservatives If you or your partner are pregnant or trying to get pregnant Breast-feeding How  should I use this medication? This medication is injected into a vein. It is given by your care team in a hospital or clinic setting. Talk to your care team about the use of this medication in children. Special care may be needed. Overdosage: If you think you have taken too much of this medicine contact a poison control center or emergency room at once. NOTE: This medicine is only for you. Do not share this medicine with others. What if I miss a dose? Keep appointments for follow-up doses. It is important not to miss a dose. Call your care team if you are unable to keep an appointment. What may interact with this medication? Do not take this medication with any of the following: Cisapride Dronedarone Pimozide Thioridazine This medication may also interact with the following: Aspirin and aspirin-like medications Certain medications that treat or prevent blood clots, such as warfarin, apixaban, dabigatran, and rivaroxaban Cisplatin Cyclosporine Diuretics Medications for infection, such as acyclovir, adefovir, amphotericin B, bacitracin, cidofovir, foscarnet, ganciclovir, gentamicin, pentamidine, vancomycin NSAIDs, medications for pain and inflammation, such as ibuprofen or naproxen Other medications that cause heart rhythm changes Pamidronate Zoledronic acid This list may not describe all possible interactions. Give your health care provider a list of all the medicines, herbs, non-prescription drugs, or dietary supplements you use. Also tell them if you smoke, drink alcohol, or use illegal drugs. Some items may interact with your medicine. What should I watch for while using this medication? Your condition will be monitored carefully while you are receiving this medication. You may need blood work while taking this medication. This medication may make  you feel generally unwell. This is not uncommon as chemotherapy can affect healthy cells as well as cancer cells. Report any side effects.  Continue your course of treatment even though you feel ill unless your care team tells you to stop. This medication may increase your risk of getting an infection. Call your care team for advice if you get a fever, chills, sore throat, or other symptoms of a cold or flu. Do not treat yourself. Try to avoid being around people who are sick. Avoid taking medications that contain aspirin, acetaminophen, ibuprofen, naproxen, or ketoprofen unless instructed by your care team. These medications may hide a fever. Be careful brushing or flossing your teeth or using a toothpick because you may get an infection or bleed more easily. If you have any dental work done, tell your dentist you are receiving this medication. This medication can make you more sensitive to cold. Do not drink cold drinks or use ice. Cover exposed skin before coming in contact with cold temperatures or cold objects. When out in cold weather wear warm clothing and cover your mouth and nose to warm the air that goes into your lungs. Tell your care team if you get sensitive to the cold. Talk to your care team if you or your partner are pregnant or think either of you might be pregnant. This medication can cause serious birth defects if taken during pregnancy and for 9 months after the last dose. A negative pregnancy test is required before starting this medication. A reliable form of contraception is recommended while taking this medication and for 9 months after the last dose. Talk to your care team about effective forms of contraception. Do not father a child while taking this medication and for 6 months after the last dose. Use a condom while having sex during this time period. Do not breastfeed while taking this medication and for 3 months after the last dose. This medication may cause infertility. Talk to your care team if you are concerned about your fertility. What side effects may I notice from receiving this medication? Side effects that  you should report to your care team as soon as possible: Allergic reactions--skin rash, itching, hives, swelling of the face, lips, tongue, or throat Bleeding--bloody or black, tar-like stools, vomiting blood or brown material that looks like coffee grounds, red or dark brown urine, small red or purple spots on skin, unusual bruising or bleeding Dry cough, shortness of breath or trouble breathing Heart rhythm changes--fast or irregular heartbeat, dizziness, feeling faint or lightheaded, chest pain, trouble breathing Infection--fever, chills, cough, sore throat, wounds that don't heal, pain or trouble when passing urine, general feeling of discomfort or being unwell Liver injury--right upper belly pain, loss of appetite, nausea, light-colored stool, dark yellow or brown urine, yellowing skin or eyes, unusual weakness or fatigue Low red blood cell level--unusual weakness or fatigue, dizziness, headache, trouble breathing Muscle injury--unusual weakness or fatigue, muscle pain, dark yellow or brown urine, decrease in amount of urine Pain, tingling, or numbness in the hands or feet Sudden and severe headache, confusion, change in vision, seizures, which may be signs of posterior reversible encephalopathy syndrome (PRES) Unusual bruising or bleeding Side effects that usually do not require medical attention (report to your care team if they continue or are bothersome): Diarrhea Nausea Pain, redness, or swelling with sores inside the mouth or throat Unusual weakness or fatigue Vomiting This list may not describe all possible side effects. Call your doctor for medical advice about  side effects. You may report side effects to FDA at 1-800-FDA-1088. Where should I keep my medication? This medication is given in a hospital or clinic. It will not be stored at home. NOTE: This sheet is a summary. It may not cover all possible information. If you have questions about this medicine, talk to your doctor,  pharmacist, or health care provider.  2024 Elsevier/Gold Standard (2023-03-14 00:00:00)  Leucovorin Injection What is this medication? LEUCOVORIN (loo koe VOR in) prevents side effects from certain medications, such as methotrexate. It works by increasing folate levels. This helps protect healthy cells in your body. It may also be used to treat anemia caused by low levels of folate. It can also be used with fluorouracil, a type of chemotherapy, to treat colorectal cancer. It works by increasing the effects of fluorouracil in the body. This medicine may be used for other purposes; ask your health care provider or pharmacist if you have questions. What should I tell my care team before I take this medication? They need to know if you have any of these conditions: Anemia from low levels of vitamin B12 in the blood An unusual or allergic reaction to leucovorin, folic acid, other medications, foods, dyes, or preservatives Pregnant or trying to get pregnant Breastfeeding How should I use this medication? This medication is injected into a vein or a muscle. It is given by your care team in a hospital or clinic setting. Talk to your care team about the use of this medication in children. Special care may be needed. Overdosage: If you think you have taken too much of this medicine contact a poison control center or emergency room at once. NOTE: This medicine is only for you. Do not share this medicine with others. What if I miss a dose? Keep appointments for follow-up doses. It is important not to miss your dose. Call your care team if you are unable to keep an appointment. What may interact with this medication? Capecitabine Fluorouracil Phenobarbital Phenytoin Primidone Trimethoprim;sulfamethoxazole This list may not describe all possible interactions. Give your health care provider a list of all the medicines, herbs, non-prescription drugs, or dietary supplements you use. Also tell them if you  smoke, drink alcohol, or use illegal drugs. Some items may interact with your medicine. What should I watch for while using this medication? Your condition will be monitored carefully while you are receiving this medication. This medication may increase the side effects of 5-fluorouracil. Tell your care team if you have diarrhea or mouth sores that do not get better or that get worse. What side effects may I notice from receiving this medication? Side effects that you should report to your care team as soon as possible: Allergic reactions--skin rash, itching, hives, swelling of the face, lips, tongue, or throat This list may not describe all possible side effects. Call your doctor for medical advice about side effects. You may report side effects to FDA at 1-800-FDA-1088. Where should I keep my medication? This medication is given in a hospital or clinic. It will not be stored at home. NOTE: This sheet is a summary. It may not cover all possible information. If you have questions about this medicine, talk to your doctor, pharmacist, or health care provider.  2024 Elsevier/Gold Standard (2021-09-04 00:00:00)  Irinotecan Injection What is this medication? IRINOTECAN (ir in oh TEE kan) treats some types of cancer. It works by slowing down the growth of cancer cells. This medicine may be used for other purposes; ask your  health care provider or pharmacist if you have questions. COMMON BRAND NAME(S): Camptosar What should I tell my care team before I take this medication? They need to know if you have any of these conditions: Dehydration Diarrhea Infection, especially a viral infection, such as chickenpox, cold sores, herpes Liver disease Low blood cell levels (white cells, red cells, and platelets) Low levels of electrolytes, such as calcium, magnesium, or potassium in your blood Recent or ongoing radiation An unusual or allergic reaction to irinotecan, other medications, foods, dyes, or  preservatives If you or your partner are pregnant or trying to get pregnant Breast-feeding How should I use this medication? This medication is injected into a vein. It is given by your care team in a hospital or clinic setting. Talk to your care team about the use of this medication in children. Special care may be needed. Overdosage: If you think you have taken too much of this medicine contact a poison control center or emergency room at once. NOTE: This medicine is only for you. Do not share this medicine with others. What if I miss a dose? Keep appointments for follow-up doses. It is important not to miss your dose. Call your care team if you are unable to keep an appointment. What may interact with this medication? Do not take this medication with any of the following: Cobicistat Itraconazole This medication may also interact with the following: Certain antibiotics, such as clarithromycin, rifampin, rifabutin Certain antivirals for HIV or AIDS Certain medications for fungal infections, such as ketoconazole, posaconazole, voriconazole Certain medications for seizures, such as carbamazepine, phenobarbital, phenytoin Gemfibrozil Nefazodone St. John's wort This list may not describe all possible interactions. Give your health care provider a list of all the medicines, herbs, non-prescription drugs, or dietary supplements you use. Also tell them if you smoke, drink alcohol, or use illegal drugs. Some items may interact with your medicine. What should I watch for while using this medication? Your condition will be monitored carefully while you are receiving this medication. You may need blood work while taking this medication. This medication may make you feel generally unwell. This is not uncommon as chemotherapy can affect healthy cells as well as cancer cells. Report any side effects. Continue your course of treatment even though you feel ill unless your care team tells you to stop. This  medication can cause serious side effects. To reduce the risk, your care team may give you other medications to take before receiving this one. Be sure to follow the directions from your care team. This medication may affect your coordination, reaction time, or judgement. Do not drive or operate machinery until you know how this medication affects you. Sit up or stand slowly to reduce the risk of dizzy or fainting spells. Drinking alcohol with this medication can increase the risk of these side effects. This medication may increase your risk of getting an infection. Call your care team for advice if you get a fever, chills, sore throat, or other symptoms of a cold or flu. Do not treat yourself. Try to avoid being around people who are sick. Avoid taking medications that contain aspirin, acetaminophen, ibuprofen, naproxen, or ketoprofen unless instructed by your care team. These medications may hide a fever. This medication may increase your risk to bruise or bleed. Call your care team if you notice any unusual bleeding. Be careful brushing or flossing your teeth or using a toothpick because you may get an infection or bleed more easily. If you have any  dental work done, tell your dentist you are receiving this medication. Talk to your care team if you or your partner are pregnant or think either of you might be pregnant. This medication can cause serious birth defects if taken during pregnancy and for 6 months after the last dose. You will need a negative pregnancy test before starting this medication. Contraception is recommended while taking this medication and for 6 months after the last dose. Your care team can help you find the option that works for you. Do not father a child while taking this medication and for 3 months after the last dose. Use a condom for contraception during this time period. Do not breastfeed while taking this medication and for 7 days after the last dose. This medication may  cause infertility. Talk to your care team if you are concerned about your fertility. What side effects may I notice from receiving this medication? Side effects that you should report to your care team as soon as possible: Allergic reactions--skin rash, itching, hives, swelling of the face, lips, tongue, or throat Dry cough, shortness of breath or trouble breathing Increased saliva or tears, increased sweating, stomach cramping, diarrhea, small pupils, unusual weakness or fatigue, slow heartbeat Infection--fever, chills, cough, sore throat, wounds that don't heal, pain or trouble when passing urine, general feeling of discomfort or being unwell Kidney injury--decrease in the amount of urine, swelling of the ankles, hands, or feet Low red blood cell level--unusual weakness or fatigue, dizziness, headache, trouble breathing Severe or prolonged diarrhea Unusual bruising or bleeding Side effects that usually do not require medical attention (report to your care team if they continue or are bothersome): Constipation Diarrhea Hair loss Loss of appetite Nausea Stomach pain This list may not describe all possible side effects. Call your doctor for medical advice about side effects. You may report side effects to FDA at 1-800-FDA-1088. Where should I keep my medication? This medication is given in a hospital or clinic. It will not be stored at home. NOTE: This sheet is a summary. It may not cover all possible information. If you have questions about this medicine, talk to your doctor, pharmacist, or health care provider.  2024 Elsevier/Gold Standard (2021-08-13 00:00:00)  Fluorouracil Injection What is this medication? FLUOROURACIL (flure oh YOOR a sil) treats some types of cancer. It works by slowing down the growth of cancer cells. This medicine may be used for other purposes; ask your health care provider or pharmacist if you have questions. COMMON BRAND NAME(S): Adrucil What should I tell my  care team before I take this medication? They need to know if you have any of these conditions: Blood disorders Dihydropyrimidine dehydrogenase (DPD) deficiency Infection, such as chickenpox, cold sores, herpes Kidney disease Liver disease Poor nutrition Recent or ongoing radiation therapy An unusual or allergic reaction to fluorouracil, other medications, foods, dyes, or preservatives If you or your partner are pregnant or trying to get pregnant Breast-feeding How should I use this medication? This medication is injected into a vein. It is administered by your care team in a hospital or clinic setting. Talk to your care team about the use of this medication in children. Special care may be needed. Overdosage: If you think you have taken too much of this medicine contact a poison control center or emergency room at once. NOTE: This medicine is only for you. Do not share this medicine with others. What if I miss a dose? Keep appointments for follow-up doses. It is important not  to miss your dose. Call your care team if you are unable to keep an appointment. What may interact with this medication? Do not take this medication with any of the following: Live virus vaccines This medication may also interact with the following: Medications that treat or prevent blood clots, such as warfarin, enoxaparin, dalteparin This list may not describe all possible interactions. Give your health care provider a list of all the medicines, herbs, non-prescription drugs, or dietary supplements you use. Also tell them if you smoke, drink alcohol, or use illegal drugs. Some items may interact with your medicine. What should I watch for while using this medication? Your condition will be monitored carefully while you are receiving this medication. This medication may make you feel generally unwell. This is not uncommon as chemotherapy can affect healthy cells as well as cancer cells. Report any side effects.  Continue your course of treatment even though you feel ill unless your care team tells you to stop. In some cases, you may be given additional medications to help with side effects. Follow all directions for their use. This medication may increase your risk of getting an infection. Call your care team for advice if you get a fever, chills, sore throat, or other symptoms of a cold or flu. Do not treat yourself. Try to avoid being around people who are sick. This medication may increase your risk to bruise or bleed. Call your care team if you notice any unusual bleeding. Be careful brushing or flossing your teeth or using a toothpick because you may get an infection or bleed more easily. If you have any dental work done, tell your dentist you are receiving this medication. Avoid taking medications that contain aspirin, acetaminophen, ibuprofen, naproxen, or ketoprofen unless instructed by your care team. These medications may hide a fever. Do not treat diarrhea with over the counter products. Contact your care team if you have diarrhea that lasts more than 2 days or if it is severe and watery. This medication can make you more sensitive to the sun. Keep out of the sun. If you cannot avoid being in the sun, wear protective clothing and sunscreen. Do not use sun lamps, tanning beds, or tanning booths. Talk to your care team if you or your partner wish to become pregnant or think you might be pregnant. This medication can cause serious birth defects if taken during pregnancy and for 3 months after the last dose. A reliable form of contraception is recommended while taking this medication and for 3 months after the last dose. Talk to your care team about effective forms of contraception. Do not father a child while taking this medication and for 3 months after the last dose. Use a condom while having sex during this time period. Do not breastfeed while taking this medication. This medication may cause  infertility. Talk to your care team if you are concerned about your fertility. What side effects may I notice from receiving this medication? Side effects that you should report to your care team as soon as possible: Allergic reactions--skin rash, itching, hives, swelling of the face, lips, tongue, or throat Heart attack--pain or tightness in the chest, shoulders, arms, or jaw, nausea, shortness of breath, cold or clammy skin, feeling faint or lightheaded Heart failure--shortness of breath, swelling of the ankles, feet, or hands, sudden weight gain, unusual weakness or fatigue Heart rhythm changes--fast or irregular heartbeat, dizziness, feeling faint or lightheaded, chest pain, trouble breathing High ammonia level--unusual weakness or fatigue, confusion,  loss of appetite, nausea, vomiting, seizures Infection--fever, chills, cough, sore throat, wounds that don't heal, pain or trouble when passing urine, general feeling of discomfort or being unwell Low red blood cell level--unusual weakness or fatigue, dizziness, headache, trouble breathing Pain, tingling, or numbness in the hands or feet, muscle weakness, change in vision, confusion or trouble speaking, loss of balance or coordination, trouble walking, seizures Redness, swelling, and blistering of the skin over hands and feet Severe or prolonged diarrhea Unusual bruising or bleeding Side effects that usually do not require medical attention (report to your care team if they continue or are bothersome): Dry skin Headache Increased tears Nausea Pain, redness, or swelling with sores inside the mouth or throat Sensitivity to light Vomiting This list may not describe all possible side effects. Call your doctor for medical advice about side effects. You may report side effects to FDA at 1-800-FDA-1088. Where should I keep my medication? This medication is given in a hospital or clinic. It will not be stored at home. NOTE: This sheet is a summary.  It may not cover all possible information. If you have questions about this medicine, talk to your doctor, pharmacist, or health care provider.  2024 Elsevier/Gold Standard (2021-08-07 00:00:00)   To help prevent nausea and vomiting after your treatment, we encourage you to take your nausea medication as directed.  BELOW ARE SYMPTOMS THAT SHOULD BE REPORTED IMMEDIATELY: *FEVER GREATER THAN 100.4 F (38 C) OR HIGHER *CHILLS OR SWEATING *NAUSEA AND VOMITING THAT IS NOT CONTROLLED WITH YOUR NAUSEA MEDICATION *UNUSUAL SHORTNESS OF BREATH *UNUSUAL BRUISING OR BLEEDING *URINARY PROBLEMS (pain or burning when urinating, or frequent urination) *BOWEL PROBLEMS (unusual diarrhea, constipation, pain near the anus) TENDERNESS IN MOUTH AND THROAT WITH OR WITHOUT PRESENCE OF ULCERS (sore throat, sores in mouth, or a toothache) UNUSUAL RASH, SWELLING OR PAIN  UNUSUAL VAGINAL DISCHARGE OR ITCHING   Items with * indicate a potential emergency and should be followed up as soon as possible or go to the Emergency Department if any problems should occur.  Please show the CHEMOTHERAPY ALERT CARD or IMMUNOTHERAPY ALERT CARD at check-in to the Emergency Department and triage nurse.  Should you have questions after your visit or need to cancel or reschedule your appointment, please contact [facility] - A DEPT OF [facility]  Dept: [phone]  and follow the prompts.  Office hours are 8:00 a.m. to 4:30 p.m. Monday - Friday. Please note that voicemails left after 4:00 p.m. may not be returned until the following business day.  We are closed weekends and major holidays. You have access to a nurse at all times for urgent questions. Please call the main number to the clinic Dept: [phone] and follow the prompts.   For any non-urgent questions, you may also contact your provider using MyChart. We now offer e-Visits for anyone 67 and older to request care online for non-urgent  symptoms. For details visit [email or web address].   Also download the MyChart app! Go to the app store, search "MyChart", open the app, select [facility], and log in with your MyChart username and password.

## 2023-06-12 ENCOUNTER — Inpatient Hospital Stay: Payer: BC Managed Care – PPO

## 2023-06-12 VITALS — BP 121/87 | HR 70 | Temp 98.1°F | Resp 16

## 2023-06-12 DIAGNOSIS — Z452 Encounter for adjustment and management of vascular access device: Secondary | ICD-10-CM | POA: Diagnosis not present

## 2023-06-12 DIAGNOSIS — Z5111 Encounter for antineoplastic chemotherapy: Secondary | ICD-10-CM | POA: Diagnosis not present

## 2023-06-12 DIAGNOSIS — Z5189 Encounter for other specified aftercare: Secondary | ICD-10-CM | POA: Diagnosis not present

## 2023-06-12 DIAGNOSIS — R197 Diarrhea, unspecified: Secondary | ICD-10-CM | POA: Diagnosis not present

## 2023-06-12 DIAGNOSIS — C250 Malignant neoplasm of head of pancreas: Secondary | ICD-10-CM | POA: Diagnosis not present

## 2023-06-12 DIAGNOSIS — I10 Essential (primary) hypertension: Secondary | ICD-10-CM | POA: Diagnosis not present

## 2023-06-12 DIAGNOSIS — Q2381 Bicuspid aortic valve: Secondary | ICD-10-CM | POA: Diagnosis not present

## 2023-06-12 DIAGNOSIS — G620 Drug-induced polyneuropathy: Secondary | ICD-10-CM | POA: Diagnosis not present

## 2023-06-12 MED ORDER — PEGFILGRASTIM-JMDB 6 MG/0.6ML ~~LOC~~ SOSY
6.0000 mg | PREFILLED_SYRINGE | Freq: Once | SUBCUTANEOUS | Status: AC
  Administered 2023-06-12: 6 mg via SUBCUTANEOUS
  Filled 2023-06-12: qty 0.6

## 2023-06-12 MED ORDER — HEPARIN SOD (PORK) LOCK FLUSH 100 UNIT/ML IV SOLN
500.0000 [IU] | Freq: Once | INTRAVENOUS | Status: AC | PRN
  Administered 2023-06-12: 500 [IU]

## 2023-06-12 MED ORDER — SODIUM CHLORIDE 0.9% FLUSH
10.0000 mL | INTRAVENOUS | Status: DC | PRN
  Administered 2023-06-12: 10 mL

## 2023-06-12 NOTE — Patient Instructions (Signed)

## 2023-06-20 ENCOUNTER — Encounter (HOSPITAL_COMMUNITY): Payer: Self-pay

## 2023-06-20 NOTE — Pre-Procedure Instructions (Signed)
 Surgical Instructions   Your procedure is scheduled on July 02, 2023. Report to [facility] Main Entrance "A" at 7:00 A.M., then check in with the Admitting office. Any questions or running late day of surgery: call [phone]  Questions prior to your surgery date: call [phone], Monday-Friday, 8am-4pm. If you experience any cold or flu symptoms such as cough, fever, chills, shortness of breath, etc. between now and your scheduled surgery, please notify us at the above number.     Remember:  Do not eat after midnight the night before your surgery   You may drink clear liquids until 6:00 AM the morning of your surgery.   Clear liquids allowed are: Water, Non-Citrus Juices (without pulp), Carbonated Beverages, Clear Tea (no milk, honey, etc.), Black Coffee Only (NO MILK, CREAM OR POWDERED CREAMER of any kind), and Gatorade.    Take these medicines the morning of surgery with A SIP OF WATER: levothyroxine (SYNTHROID)  omeprazole (PRILOSEC)    May take these medicines IF NEEDED: oxyCODONE (OXY IR/ROXICODONE)  prochlorperazine (COMPAZINE)    One week prior to surgery, STOP taking any Aspirin (unless otherwise instructed by your surgeon) Aleve, Naproxen, Ibuprofen, Motrin, Advil, Goody's, BC's, all herbal medications, fish oil, and non-prescription vitamins.                     Do NOT Smoke (Tobacco/Vaping) for 24 hours prior to your procedure.  If you use a CPAP at night, you may bring your mask/headgear for your overnight stay.   You will be asked to remove any contacts, glasses, piercing's, hearing aid's, dentures/partials prior to surgery. Please bring cases for these items if needed.    Patients discharged the day of surgery will not be allowed to drive home, and someone needs to stay with them for 24 hours.  SURGICAL WAITING ROOM VISITATION Patients may have no more than 2 support people in the waiting area - these visitors may rotate.   Pre-op nurse will coordinate an  appropriate time for 1 ADULT support person, who may not rotate, to accompany patient in pre-op.  Children under the age of 51 must have an adult with them who is not the patient and must remain in the main waiting area with an adult.  If the patient needs to stay at the hospital during part of their recovery, the visitor guidelines for inpatient rooms apply.  Please refer to the [facility] website for the visitor guidelines for any additional information.   If you received a COVID test during your pre-op visit  it is requested that you wear a mask when out in public, stay away from anyone that may not be feeling well and notify your surgeon if you develop symptoms. If you have been in contact with anyone that has tested positive in the last 10 days please notify you surgeon.      Pre-operative CHG Bathing Instructions   You can play a key role in reducing the risk of infection after surgery. Your skin needs to be as free of germs as possible. You can reduce the number of germs on your skin by washing with CHG (chlorhexidine gluconate) soap before surgery. CHG is an antiseptic soap that kills germs and continues to kill germs even after washing.   DO NOT use if you have an allergy to chlorhexidine/CHG or antibacterial soaps. If your skin becomes reddened or irritated, stop using the CHG and notify one of our RNs at [phone].  TAKE A SHOWER THE NIGHT BEFORE SURGERY AND THE DAY OF SURGERY    Please keep in mind the following:  DO NOT shave, including legs and underarms, 48 hours prior to surgery.   You may shave your face before/day of surgery.  Place clean sheets on your bed the night before surgery Use a clean washcloth (not used since being washed) for each shower. DO NOT sleep with pet's night before surgery.  CHG Shower Instructions:  Wash your face and private area with normal soap. If you choose to wash your hair, wash first with your normal shampoo.  After you use  shampoo/soap, rinse your hair and body thoroughly to remove shampoo/soap residue.  Turn the water OFF and apply half the bottle of CHG soap to a CLEAN washcloth.  Apply CHG soap ONLY FROM YOUR NECK DOWN TO YOUR TOES (washing for 3-5 minutes)  DO NOT use CHG soap on face, private areas, open wounds, or sores.  Pay special attention to the area where your surgery is being performed.  If you are having back surgery, having someone wash your back for you may be helpful. Wait 2 minutes after CHG soap is applied, then you may rinse off the CHG soap.  Pat dry with a clean towel  Put on clean pajamas    Additional instructions for the day of surgery: DO NOT APPLY any lotions, deodorants, cologne, or perfumes.   Do not wear jewelry or makeup Do not wear nail polish, gel polish, artificial nails, or any other type of covering on natural nails (fingers and toes) Do not bring valuables to the hospital. [facility] is not responsible for valuables/personal belongings. Put on clean/comfortable clothes.  Please brush your teeth.  Ask your nurse before applying any prescription medications to the skin.

## 2023-06-23 ENCOUNTER — Other Ambulatory Visit: Payer: Self-pay

## 2023-06-23 ENCOUNTER — Encounter (HOSPITAL_COMMUNITY): Payer: Self-pay

## 2023-06-23 ENCOUNTER — Encounter (HOSPITAL_COMMUNITY)
Admission: RE | Admit: 2023-06-23 | Discharge: 2023-06-23 | Disposition: A | Discharge status: Home / Self Care | Source: Ambulatory Visit | Attending: General Surgery | Admitting: General Surgery

## 2023-06-23 VITALS — BP 111/76 | HR 81 | Temp 97.7°F | Resp 17 | Ht 68.0 in | Wt 129.9 lb

## 2023-06-23 DIAGNOSIS — C250 Malignant neoplasm of head of pancreas: Secondary | ICD-10-CM

## 2023-06-23 DIAGNOSIS — I2510 Atherosclerotic heart disease of native coronary artery without angina pectoris: Secondary | ICD-10-CM | POA: Diagnosis not present

## 2023-06-23 DIAGNOSIS — C259 Malignant neoplasm of pancreas, unspecified: Secondary | ICD-10-CM | POA: Diagnosis not present

## 2023-06-23 DIAGNOSIS — Z01818 Encounter for other preprocedural examination: Secondary | ICD-10-CM | POA: Insufficient documentation

## 2023-06-23 HISTORY — DX: Pneumonia, unspecified organism: J18.9

## 2023-06-23 HISTORY — DX: Peripheral vascular disease, unspecified: I73.9

## 2023-06-23 HISTORY — DX: Inflammatory liver disease, unspecified: K75.9

## 2023-06-23 LAB — CBC WITH DIFFERENTIAL/PLATELET
Abs Immature Granulocytes: 0.87 10*3/uL — ABNORMAL HIGH (ref 0.00–0.07)
Basophils Absolute: 0 10*3/uL (ref 0.0–0.1)
Basophils Relative: 0 %
Eosinophils Absolute: 0 10*3/uL (ref 0.0–0.5)
Eosinophils Relative: 0 %
HCT: 27.7 % — ABNORMAL LOW (ref 39.0–52.0)
Hemoglobin: 9.3 g/dL — ABNORMAL LOW (ref 13.0–17.0)
Immature Granulocytes: 10 %
Lymphocytes Relative: 15 %
Lymphs Abs: 1.4 10*3/uL (ref 0.7–4.0)
MCH: 33.9 pg (ref 26.0–34.0)
MCHC: 33.6 g/dL (ref 30.0–36.0)
MCV: 101.1 fL — ABNORMAL HIGH (ref 80.0–100.0)
Monocytes Absolute: 1 10*3/uL (ref 0.1–1.0)
Monocytes Relative: 11 %
Neutro Abs: 5.8 10*3/uL (ref 1.7–7.7)
Neutrophils Relative %: 64 %
Platelets: 89 10*3/uL — ABNORMAL LOW (ref 150–400)
RBC: 2.74 MIL/uL — ABNORMAL LOW (ref 4.22–5.81)
RDW: 17.6 % — ABNORMAL HIGH (ref 11.5–15.5)
Smear Review: NORMAL
WBC: 9.2 10*3/uL (ref 4.0–10.5)
nRBC: 0.3 % — ABNORMAL HIGH (ref 0.0–0.2)

## 2023-06-23 LAB — PROTIME-INR
INR: 1.1 (ref 0.8–1.2)
Prothrombin Time: 14.7 s (ref 11.4–15.2)

## 2023-06-23 LAB — COMPREHENSIVE METABOLIC PANEL
ALT: 68 U/L — ABNORMAL HIGH (ref 0–44)
AST: 69 U/L — ABNORMAL HIGH (ref 15–41)
Albumin: 3.2 g/dL — ABNORMAL LOW (ref 3.5–5.0)
Alkaline Phosphatase: 493 U/L — ABNORMAL HIGH (ref 38–126)
Anion gap: 9 (ref 5–15)
BUN: 8 mg/dL (ref 8–23)
CO2: 28 mmol/L (ref 22–32)
Calcium: 9.4 mg/dL (ref 8.9–10.3)
Chloride: 101 mmol/L (ref 98–111)
Creatinine, Ser: 1.02 mg/dL (ref 0.61–1.24)
GFR, Estimated: >60 mL/min (ref >60)
Glucose, Bld: 105 mg/dL — ABNORMAL HIGH (ref 70–99)
Potassium: 4 mmol/L (ref 3.5–5.1)
Sodium: 138 mmol/L (ref 135–145)
Total Bilirubin: 0.6 mg/dL (ref 0.0–1.2)
Total Protein: 6.4 g/dL — ABNORMAL LOW (ref 6.5–8.1)

## 2023-06-23 NOTE — Progress Notes (Addendum)
 PCP - Was Dr. Lucky Cowboy put MD recently passed. Pt is looking for another PCP in [place], [place] Cardiologist - Dr. Huston Foley - Last office visit 06/24/2022  PPM/ICD - Denies Device Orders - n/a Rep Notified - n/a  Chest x-ray - 02/12/2023 EKG - 06/23/2023 Stress Test - Per pt, many years ago - result normal ECHO - 06/21/2022 Cardiac Cath - Denies  Sleep Study - Denies CPAP - n/a  No DM  Last dose of GLP1 agonist- n/a  GLP1 instructions: n/a  Blood Thinner Instructions: n/a Aspirin Instructions: n/a  ERAS Protcol - Clear liquids until 0600 morning of surgery PRE-SURGERY Ensure or G2- n/a  COVID TEST- n/a   Anesthesia review: Yes. Hx of murmur (bicuspid aortic valve), HTN and AAA that is being followed by Dr. Charlett Lango. Pt denies any cardio-pulmonary symptoms. Abnormal platelet/hgb results.   Patient denies shortness of breath, fever, cough and chest pain at PAT appointment. Pt denies any respiratory illness/infection in the last two months   All instructions explained to the patient, with a verbal understanding of the material. Patient agrees to go over the instructions while at home for a better understanding. Patient also instructed to self quarantine after being tested for COVID-19. The opportunity to ask questions was provided.

## 2023-06-24 ENCOUNTER — Inpatient Hospital Stay

## 2023-06-24 ENCOUNTER — Inpatient Hospital Stay: Payer: BC Managed Care – PPO | Attending: Oncology

## 2023-06-24 ENCOUNTER — Inpatient Hospital Stay: Payer: BC Managed Care – PPO

## 2023-06-24 ENCOUNTER — Encounter: Payer: Self-pay | Admitting: Nurse Practitioner

## 2023-06-24 ENCOUNTER — Inpatient Hospital Stay (HOSPITAL_BASED_OUTPATIENT_CLINIC_OR_DEPARTMENT_OTHER): Payer: BC Managed Care – PPO | Admitting: Nurse Practitioner

## 2023-06-24 VITALS — BP 101/79 | HR 80 | Temp 98.2°F | Resp 18 | Ht 68.0 in | Wt 130.5 lb

## 2023-06-24 DIAGNOSIS — C250 Malignant neoplasm of head of pancreas: Secondary | ICD-10-CM | POA: Insufficient documentation

## 2023-06-24 DIAGNOSIS — Z95828 Presence of other vascular implants and grafts: Secondary | ICD-10-CM

## 2023-06-24 DIAGNOSIS — I10 Essential (primary) hypertension: Secondary | ICD-10-CM | POA: Diagnosis not present

## 2023-06-24 DIAGNOSIS — G893 Neoplasm related pain (acute) (chronic): Secondary | ICD-10-CM | POA: Diagnosis not present

## 2023-06-24 LAB — CBC WITH DIFFERENTIAL (CANCER CENTER ONLY)
Abs Immature Granulocytes: 0.62 10*3/uL — ABNORMAL HIGH (ref 0.00–0.07)
Basophils Absolute: 0 10*3/uL (ref 0.0–0.1)
Basophils Relative: 0 %
Eosinophils Absolute: 0 10*3/uL (ref 0.0–0.5)
Eosinophils Relative: 1 %
HCT: 24.5 % — ABNORMAL LOW (ref 39.0–52.0)
Hemoglobin: 8.3 g/dL — ABNORMAL LOW (ref 13.0–17.0)
Immature Granulocytes: 8 %
Lymphocytes Relative: 21 %
Lymphs Abs: 1.7 10*3/uL (ref 0.7–4.0)
MCH: 33.5 pg (ref 26.0–34.0)
MCHC: 33.9 g/dL (ref 30.0–36.0)
MCV: 98.8 fL (ref 80.0–100.0)
Monocytes Absolute: 0.9 10*3/uL (ref 0.1–1.0)
Monocytes Relative: 11 %
Neutro Abs: 4.9 10*3/uL (ref 1.7–7.7)
Neutrophils Relative %: 59 %
Platelet Count: 105 10*3/uL — ABNORMAL LOW (ref 150–400)
RBC: 2.48 MIL/uL — ABNORMAL LOW (ref 4.22–5.81)
RDW: 17.9 % — ABNORMAL HIGH (ref 11.5–15.5)
WBC Count: 8.1 10*3/uL (ref 4.0–10.5)
nRBC: 0.4 % — ABNORMAL HIGH (ref 0.0–0.2)

## 2023-06-24 LAB — CMP (CANCER CENTER ONLY)
ALT: 54 U/L — ABNORMAL HIGH (ref 0–44)
AST: 53 U/L — ABNORMAL HIGH (ref 15–41)
Albumin: 3.8 g/dL (ref 3.5–5.0)
Alkaline Phosphatase: 535 U/L — ABNORMAL HIGH (ref 38–126)
Anion gap: 9 (ref 5–15)
BUN: 10 mg/dL (ref 8–23)
CO2: 27 mmol/L (ref 22–32)
Calcium: 8.9 mg/dL (ref 8.9–10.3)
Chloride: 101 mmol/L (ref 98–111)
Creatinine: 0.78 mg/dL (ref 0.61–1.24)
GFR, Estimated: >60 mL/min (ref >60)
Glucose, Bld: 97 mg/dL (ref 70–99)
Potassium: 3.6 mmol/L (ref 3.5–5.1)
Sodium: 137 mmol/L (ref 135–145)
Total Bilirubin: 0.5 mg/dL (ref 0.0–1.2)
Total Protein: 6.3 g/dL — ABNORMAL LOW (ref 6.5–8.1)

## 2023-06-24 MED ORDER — HEPARIN SOD (PORK) LOCK FLUSH 100 UNIT/ML IV SOLN
500.0000 [IU] | Freq: Once | INTRAVENOUS | Status: AC
  Administered 2023-06-24: 500 [IU] via INTRAVENOUS

## 2023-06-24 MED ORDER — SODIUM CHLORIDE 0.9% FLUSH
10.0000 mL | INTRAVENOUS | Status: DC | PRN
  Administered 2023-06-24: 10 mL

## 2023-06-24 NOTE — Progress Notes (Signed)
  [facility] OFFICE PROGRESS NOTE   Diagnosis: Pancreas cancer  INTERVAL HISTORY:   Randy Allen returns as scheduled.  He completed cycle 8 FOLFIRINOX 06/10/2023.  He denies nausea/vomiting.  He had a single mouth sore which has resolved.  The first week he had about 2 loose stools a day.  Bowel habits are now back to baseline.  He has mild persistent cold sensitivity.  He denies abdominal pain.  No hand or foot pain or redness.  Appetite decreased following chemotherapy, now improving.  Objective:  Vital signs in last 24 hours:  Blood pressure 101/79, pulse 80, temperature 98.2 F (36.8 C), temperature source Temporal, resp. rate 18, height 5\' 8"  (1.727 m), weight 130 lb 8 oz (59.2 kg), SpO2 100%.    HEENT: No thrush or ulcers. Resp: Lungs clear bilaterally. Cardio: Regular rate and rhythm. GI: No hepatosplenomegaly. Vascular: No leg edema. Skin: Palms without erythema. Port-A-Cath without erythema.  Lab Results:  Lab Results  Component Value Date   WBC 8.1 06/24/2023   HGB 8.3 (L) 06/24/2023   HCT 24.5 (L) 06/24/2023   MCV 98.8 06/24/2023   PLT 105 (L) 06/24/2023   NEUTROABS 4.9 06/24/2023    Imaging:  No results found.  Medications: I have reviewed the patient's current medications.  Assessment/Plan: Pancreas cancer Elevated liver enzymes/bilirubin September 2024 CT abdomen/pelvis 01/10/2023-no focal liver abnormality, no biliary dilation, pancreas unremarkable MRI/MRCP 01/15/2023-mild intra and extrahepatic biliary duct dilation, common duct is abruptly truncated in the superior pancreas head, hypoenhancing mass in the central pancreas head measuring 2.6 x 2.4 cm, remaining pancreas is atrophic with dilation of the main pancreatic duct, mass contacts the adjacent central SMV and portal confluence with less than 180 degrees of contact, preserved fat plane to the SMA with no enlarged abdominal lymph nodes, no ascites ERCP 01/18/2023-single stenosis in the  lower third of the main bile duct, brushings obtained, plastic stent placed-adenocarcinoma EUS 02/06/2023-30 x 30 mm pancreas head mass with irregular outer margins, abutment of the portal vein and SMV with intact fat planes, intact interface between the mass and SMA and celiac trunk, common bile duct stent, no malignant appearing lymph nodes, T2 N0 Cycle 1 FOLFIRINOX 02/19/2023 Chemotherapy held 03/04/2023 due to neutropenia Cycle 2 FOLFIRINOX 03/10/2023, Fulphila Cycle 3 held 03/24/2023 due to liver function abnormalities Cycle 3 FOLFIRINOX 04/01/2023, Fulphila Cycle 4 FOLFIRINOX 04/15/2023, Fulphila Cycle 5 FOLFIRINOX 04/29/2023, Fulphila Cycle 6 FOLFIRINOX 05/13/2023, Fulphila CT abdomen/pelvis 05/19/2023: Decreased in the pancreas head mass, abnormal soft tissue adjacent to the celiac artery and posterior/inferior to the common hepatic artery, abnormal soft tissue abutting the SMV over 90 degrees, no lymphadenopathy Cycle 7 FOLFIRINOX 05/27/2023, Fulphila Cycle 8 FOLFIRINOX 06/10/2023, Fulphila Abdomen/back pain secondary to #1 Bicuspid aortic valve Hypertension Antral gastritis and ulcers on EUS 02/06/2023-Prilosec 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  He has completed 8 cycles of neoadjuvant FOLFIRINOX.  He is scheduled for surgery next week.  He will return for follow-up in about 3 weeks.   Lonna Cobb ANP/GNP-BC   06/24/2023  1:49 PM

## 2023-06-24 NOTE — Patient Instructions (Signed)

## 2023-06-24 NOTE — Progress Notes (Signed)
 Anesthesia Chart Review:  63 year old male follows with cardiology for history of HLD, HTN, ascending aortic aneurysm, bicuspid AV with moderate AS (mean gradient 22 mmHg by echo 06/2022).  Last seen by Edd Fabian, NP 02/05/2023 for preop clearance prior to undergoing port placement by Dr. Donell Beers.  Patient was cleared at that time as low risk, 0.9% chance of major cardiac event.  Patient subsequently had repeat echo 06/30/2023 showing moderate stenosis with mean gradient 25 mmHg and AVA 0.99 cm.  Last seen by cardiothoracic surgeon Dr. Dorris Fetch on 02/25/2023 for follow-up of ascending aortic aneurysm.  At that time CT showed no change in the 5.1 to 5.2 cm ascending thoracic aortic aneurysm.  He was recommended to follow-up in 6 months.  Follows with oncologist Dr. Truett Perna for history of pancreatic cancer.  He has been on neoadjuvant chemotherapy FOLFIRINOX, completed cycle 8 on 06/10/2023.  CMP and CBC 06/24/2023 reviewed, significant anemia with hemoglobin 8.3, platelets stable at 105. Reviewed with anesthesiologist Dr. Desmond Lope, advised okay to proceed as planned  EKG 06/23/2023: NSR.  Rate 76.  TTE 06/21/2022:  1. Left ventricular ejection fraction, by estimation, is 60 to 65%. The  left ventricle has normal function. The left ventricle has no regional  wall motion abnormalities. There is moderate asymmetric left ventricular  hypertrophy of the basal-septal  segment. Left ventricular diastolic parameters were normal.   2. Right ventricular systolic function is normal. The right ventricular  size is normal. Tricuspid regurgitation signal is inadequate for assessing  PA pressure.   3. The mitral valve is normal in structure. Trivial mitral valve  regurgitation. No evidence of mitral stenosis.   4. The aortic valve is bicuspid. Fusion of right and noncoronary cusps.  Aortic valve regurgitation is mild. Moderate aortic valve stenosis. Vmax  3.1 m/s, MG 22 mmHg, AVA 1,2 cm^2, DI 0.32   5. The  inferior vena cava is normal in size with greater than 50%  respiratory variability, suggesting right atrial pressure of 3 mmHg.   6. Aortic dilatation noted. Aneurysm of the ascending aorta, measuring 52  mm. There is dilatation of the aortic root, measuring 43 mm.     Zannie Cove [facility] Short Stay Center/Anesthesiology Phone [phone] 06/24/2023 1:15 PM

## 2023-06-24 NOTE — Progress Notes (Signed)
 [facility] Healthcare Advance Directives Clinical Social Work  Patient presented to Advance Directives Clinic  to review and complete healthcare advance directives.  Clinical Social Worker met with patient and spouse.  The patient designated Randy Allen as their primary healthcare agent. No Secondary Agent was identified. Patient declined to complete living will, documents reflect this.Documents were notarized and copies made for patient/family. Clinical Social Worker sent  documents to medical records to be scanned into patient's chart.Clinical Social Worker encouraged patient/family to contact with any additional questions or concerns.   Marguerita Merles, LCSW Clinical Social Worker [facility]

## 2023-06-25 ENCOUNTER — Telehealth: Payer: Self-pay

## 2023-06-25 DIAGNOSIS — Q2381 Bicuspid aortic valve: Secondary | ICD-10-CM

## 2023-06-25 NOTE — Telephone Encounter (Signed)
 Dr. Tenny Craw called triage and stated needs echo before he gets his whipple on 07/02/2023  Misty Stanley are we able to get him in before 07/02/2023?

## 2023-06-27 ENCOUNTER — Other Ambulatory Visit: Payer: Self-pay

## 2023-06-30 ENCOUNTER — Encounter (HOSPITAL_COMMUNITY): Payer: Self-pay | Admitting: Radiology

## 2023-06-30 ENCOUNTER — Ambulatory Visit (HOSPITAL_BASED_OUTPATIENT_CLINIC_OR_DEPARTMENT_OTHER)

## 2023-06-30 DIAGNOSIS — K6389 Other specified diseases of intestine: Secondary | ICD-10-CM | POA: Diagnosis not present

## 2023-06-30 DIAGNOSIS — E875 Hyperkalemia: Secondary | ICD-10-CM | POA: Diagnosis not present

## 2023-06-30 DIAGNOSIS — E43 Unspecified severe protein-calorie malnutrition: Secondary | ICD-10-CM | POA: Diagnosis not present

## 2023-06-30 DIAGNOSIS — E8729 Other acidosis: Secondary | ICD-10-CM | POA: Diagnosis not present

## 2023-06-30 DIAGNOSIS — R578 Other shock: Secondary | ICD-10-CM | POA: Diagnosis not present

## 2023-06-30 DIAGNOSIS — K831 Obstruction of bile duct: Secondary | ICD-10-CM | POA: Diagnosis not present

## 2023-06-30 DIAGNOSIS — N179 Acute kidney failure, unspecified: Secondary | ICD-10-CM | POA: Diagnosis not present

## 2023-06-30 DIAGNOSIS — E872 Acidosis, unspecified: Secondary | ICD-10-CM | POA: Diagnosis not present

## 2023-06-30 DIAGNOSIS — I10 Essential (primary) hypertension: Secondary | ICD-10-CM | POA: Diagnosis not present

## 2023-06-30 DIAGNOSIS — I371 Nonrheumatic pulmonary valve insufficiency: Secondary | ICD-10-CM

## 2023-06-30 DIAGNOSIS — I70203 Unspecified atherosclerosis of native arteries of extremities, bilateral legs: Secondary | ICD-10-CM | POA: Diagnosis not present

## 2023-06-30 DIAGNOSIS — C259 Malignant neoplasm of pancreas, unspecified: Secondary | ICD-10-CM | POA: Diagnosis not present

## 2023-06-30 DIAGNOSIS — A419 Sepsis, unspecified organism: Secondary | ICD-10-CM | POA: Diagnosis not present

## 2023-06-30 DIAGNOSIS — J9601 Acute respiratory failure with hypoxia: Secondary | ICD-10-CM | POA: Diagnosis not present

## 2023-06-30 DIAGNOSIS — Q2381 Bicuspid aortic valve: Secondary | ICD-10-CM | POA: Diagnosis not present

## 2023-06-30 DIAGNOSIS — K811 Chronic cholecystitis: Secondary | ICD-10-CM | POA: Diagnosis not present

## 2023-06-30 DIAGNOSIS — Z90410 Acquired total absence of pancreas: Secondary | ICD-10-CM | POA: Diagnosis not present

## 2023-06-30 DIAGNOSIS — Y95 Nosocomial condition: Secondary | ICD-10-CM | POA: Diagnosis not present

## 2023-06-30 DIAGNOSIS — N2889 Other specified disorders of kidney and ureter: Secondary | ICD-10-CM | POA: Diagnosis not present

## 2023-06-30 DIAGNOSIS — K7200 Acute and subacute hepatic failure without coma: Secondary | ICD-10-CM | POA: Diagnosis not present

## 2023-06-30 DIAGNOSIS — Z4682 Encounter for fitting and adjustment of non-vascular catheter: Secondary | ICD-10-CM | POA: Diagnosis not present

## 2023-06-30 DIAGNOSIS — R0602 Shortness of breath: Secondary | ICD-10-CM | POA: Diagnosis not present

## 2023-06-30 DIAGNOSIS — I462 Cardiac arrest due to underlying cardiac condition: Secondary | ICD-10-CM | POA: Diagnosis not present

## 2023-06-30 DIAGNOSIS — J189 Pneumonia, unspecified organism: Secondary | ICD-10-CM | POA: Diagnosis not present

## 2023-06-30 DIAGNOSIS — N281 Cyst of kidney, acquired: Secondary | ICD-10-CM | POA: Diagnosis not present

## 2023-06-30 DIAGNOSIS — Z9049 Acquired absence of other specified parts of digestive tract: Secondary | ICD-10-CM | POA: Diagnosis not present

## 2023-06-30 DIAGNOSIS — R7401 Elevation of levels of liver transaminase levels: Secondary | ICD-10-CM | POA: Diagnosis not present

## 2023-06-30 DIAGNOSIS — Z66 Do not resuscitate: Secondary | ICD-10-CM | POA: Diagnosis not present

## 2023-06-30 DIAGNOSIS — I351 Nonrheumatic aortic (valve) insufficiency: Secondary | ICD-10-CM

## 2023-06-30 DIAGNOSIS — R918 Other nonspecific abnormal finding of lung field: Secondary | ICD-10-CM | POA: Diagnosis not present

## 2023-06-30 DIAGNOSIS — Z9911 Dependence on respirator [ventilator] status: Secondary | ICD-10-CM | POA: Diagnosis not present

## 2023-06-30 DIAGNOSIS — I129 Hypertensive chronic kidney disease with stage 1 through stage 4 chronic kidney disease, or unspecified chronic kidney disease: Secondary | ICD-10-CM | POA: Diagnosis not present

## 2023-06-30 DIAGNOSIS — I700 Atherosclerosis of aorta: Secondary | ICD-10-CM | POA: Diagnosis not present

## 2023-06-30 DIAGNOSIS — R188 Other ascites: Secondary | ICD-10-CM | POA: Diagnosis not present

## 2023-06-30 DIAGNOSIS — G8918 Other acute postprocedural pain: Secondary | ICD-10-CM | POA: Diagnosis not present

## 2023-06-30 DIAGNOSIS — Z452 Encounter for adjustment and management of vascular access device: Secondary | ICD-10-CM | POA: Diagnosis not present

## 2023-06-30 DIAGNOSIS — C250 Malignant neoplasm of head of pancreas: Secondary | ICD-10-CM | POA: Diagnosis not present

## 2023-06-30 DIAGNOSIS — N170 Acute kidney failure with tubular necrosis: Secondary | ICD-10-CM | POA: Diagnosis not present

## 2023-06-30 DIAGNOSIS — J948 Other specified pleural conditions: Secondary | ICD-10-CM | POA: Diagnosis not present

## 2023-06-30 DIAGNOSIS — E039 Hypothyroidism, unspecified: Secondary | ICD-10-CM | POA: Diagnosis not present

## 2023-06-30 DIAGNOSIS — J9811 Atelectasis: Secondary | ICD-10-CM | POA: Diagnosis not present

## 2023-06-30 DIAGNOSIS — I517 Cardiomegaly: Secondary | ICD-10-CM | POA: Diagnosis not present

## 2023-06-30 DIAGNOSIS — D62 Acute posthemorrhagic anemia: Secondary | ICD-10-CM | POA: Diagnosis not present

## 2023-06-30 DIAGNOSIS — J9621 Acute and chronic respiratory failure with hypoxia: Secondary | ICD-10-CM | POA: Diagnosis not present

## 2023-06-30 DIAGNOSIS — J90 Pleural effusion, not elsewhere classified: Secondary | ICD-10-CM | POA: Diagnosis not present

## 2023-06-30 DIAGNOSIS — G9341 Metabolic encephalopathy: Secondary | ICD-10-CM | POA: Diagnosis not present

## 2023-06-30 DIAGNOSIS — J869 Pyothorax without fistula: Secondary | ICD-10-CM | POA: Diagnosis not present

## 2023-06-30 DIAGNOSIS — D65 Disseminated intravascular coagulation [defibrination syndrome]: Secondary | ICD-10-CM | POA: Diagnosis not present

## 2023-06-30 DIAGNOSIS — N182 Chronic kidney disease, stage 2 (mild): Secondary | ICD-10-CM | POA: Diagnosis not present

## 2023-06-30 DIAGNOSIS — R4182 Altered mental status, unspecified: Secondary | ICD-10-CM | POA: Diagnosis not present

## 2023-06-30 DIAGNOSIS — R109 Unspecified abdominal pain: Secondary | ICD-10-CM | POA: Diagnosis not present

## 2023-06-30 DIAGNOSIS — N400 Enlarged prostate without lower urinary tract symptoms: Secondary | ICD-10-CM | POA: Diagnosis not present

## 2023-06-30 DIAGNOSIS — J9690 Respiratory failure, unspecified, unspecified whether with hypoxia or hypercapnia: Secondary | ICD-10-CM | POA: Diagnosis not present

## 2023-06-30 DIAGNOSIS — R6521 Severe sepsis with septic shock: Secondary | ICD-10-CM | POA: Diagnosis not present

## 2023-06-30 DIAGNOSIS — I350 Nonrheumatic aortic (valve) stenosis: Secondary | ICD-10-CM | POA: Diagnosis not present

## 2023-06-30 LAB — ECHOCARDIOGRAM COMPLETE
AR max vel: 0.96 cm2
AV Area VTI: 0.99 cm2
AV Area mean vel: 0.99 cm2
AV Mean grad: 25 mmHg
AV Peak grad: 49.3 mmHg
Ao pk vel: 3.51 m/s
Area-P 1/2: 3.28 cm2
S' Lateral: 2.7 cm

## 2023-06-30 NOTE — Anesthesia Preprocedure Evaluation (Signed)
 Anesthesia Evaluation  Patient identified by MRN, date of birth, ID band Patient awake    Reviewed: Allergy & Precautions, H&P , NPO status , Patient's Chart, lab work & pertinent test results  Airway Mallampati: II   Neck ROM: full    Dental   Pulmonary neg pulmonary ROS   breath sounds clear to auscultation       Cardiovascular hypertension, + Peripheral Vascular Disease  + Valvular Problems/Murmurs AS  Rhythm:regular Rate:Normal  TEE (06/30/23): EF 60-65%, bicuspid aortic valve with moderate AS with mean PG 22 mmHg.  Dimensionless index of 0.3  Ascending aortic aneurysm measuring 5.2 cm in 2021.   Neuro/Psych    GI/Hepatic hiatal hernia, PUD,GERD  ,,Pancreatic head mass with biliary obstruction.   Endo/Other  Hypothyroidism    Renal/GU      Musculoskeletal   Abdominal   Peds  Hematology   Anesthesia Other Findings   Reproductive/Obstetrics                             Anesthesia Physical Anesthesia Plan  ASA: 3  Anesthesia Plan: General and Epidural   Post-op Pain Management:    Induction: Intravenous  PONV Risk Score and Plan: 2 and Ondansetron, Dexamethasone, Midazolam and Treatment may vary due to age or medical condition  Airway Management Planned: Oral ETT  Additional Equipment: Arterial line  Intra-op Plan:   Post-operative Plan: Extubation in OR and Possible Post-op intubation/ventilation  Informed Consent: I have reviewed the patients History and Physical, chart, labs and discussed the procedure including the risks, benefits and alternatives for the proposed anesthesia with the patient or authorized representative who has indicated his/her understanding and acceptance.     Dental advisory given  Plan Discussed with: CRNA, Anesthesiologist and Surgeon  Anesthesia Plan Comments: (PAT note by Antionette Poles, PA-C: 63 year old male follows with cardiology for history of  HLD, HTN, ascending aortic aneurysm, bicuspid AV with moderate AS (mean gradient 22 mmHg by echo 06/2022).  Last seen by Edd Fabian, NP 02/05/2023 for preop clearance prior to undergoing port placement by Dr. Donell Beers.  Patient was cleared at that time as low risk, 0.9% chance of major cardiac event.  Patient subsequently had repeat echo 06/30/2023 showing moderate stenosis with mean gradient 25 mmHg and AVA 0.99 cm.  Last seen by cardiothoracic surgeon Dr. Dorris Fetch on 02/25/2023 for follow-up of ascending aortic aneurysm.  At that time CT showed no change in the 5.1 to 5.2 cm ascending thoracic aortic aneurysm.  He was recommended to follow-up in 6 months.  Follows with oncologist Dr. Truett Perna for history of pancreatic cancer.  He has been on neoadjuvant chemotherapy FOLFIRINOX, completed cycle 8 on 06/10/2023.  CMP and CBC 06/24/2023 reviewed, significant anemia with hemoglobin 8.3, platelets stable at 105. Reviewed with anesthesiologist Dr. Desmond Lope, advised okay to proceed as planned  EKG 06/23/2023: NSR.  Rate 76.  TTE 06/21/2022: 1. Left ventricular ejection fraction, by estimation, is 60 to 65%. The  left ventricle has normal function. The left ventricle has no regional  wall motion abnormalities. There is moderate asymmetric left ventricular  hypertrophy of the basal-septal  segment. Left ventricular diastolic parameters were normal.  2. Right ventricular systolic function is normal. The right ventricular  size is normal. Tricuspid regurgitation signal is inadequate for assessing  PA pressure.  3. The mitral valve is normal in structure. Trivial mitral valve  regurgitation. No evidence of mitral stenosis.  4. The aortic valve is  bicuspid. Fusion of right and noncoronary cusps.  Aortic valve regurgitation is mild. Moderate aortic valve stenosis. Vmax  3.1 m/s, MG 22 mmHg, AVA 1,2 cm^2, DI 0.32  5. The inferior vena cava is normal in size with greater than 50%  respiratory variability,  suggesting right atrial pressure of 3 mmHg.  6. Aortic dilatation noted. Aneurysm of the ascending aorta, measuring 52  mm. There is dilatation of the aortic root, measuring 43 mm.    )        Anesthesia Quick Evaluation

## 2023-06-30 NOTE — Progress Notes (Signed)
 Consulted DOD, Dr. Elease Hashimoto regarding aortic root. Patient discharged per Dr. Elease Hashimoto. Aortic size is known.

## 2023-07-01 NOTE — H&P (Signed)
 PROVIDER:  Matthias Hughs, MD Patient Care Team: Nadean Corwin, MD as PCP - General (Internal Medicine) Truett Perna Perfecto Kingdom, MD (Hematology and Oncology) Matthias Hughs, MD as Consulting Provider (Surgical Oncology) Roxy Cedar., MD (Gastroenterology) Loreli Slot, MD (Cardiothoracic Surgery) Sherrill Raring, MD (Cardiovascular Disease)   MRN: [id number] DOB: 1961/03/10    Plan Chief Complaint: Follow-up (Pancreatic Adenocarcinoma ltfu)       History of Present Illness: Randy Allen is a 63 y.o. male who is seen today for pancreatic cancer follow-up.   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  He had been having pain in the abdomen for several months and weight loss for several months before that.  He had an abdominal ultrasound in which the pancreas wasn't visualized due to gas, and then a CT in September that was negative.  Given elevated LFTs, MRCP was then performed. The MRI showed a pancreatic head mass.  There was some question of superior mesenteric vein involvement along with the portal confluence.  He had ERCP and stenting.  Bile duct brushings were positive for adenocarcinoma.   He still has issues with his appetite.  He is not able to eat any large meals.  He definitely lost more weight whenever he had the hospitalizations surrounding the jaundice.  He does think this is stabilized a little bit.   He denies any diarrhea.  He is having issues with his blood sugar.  He is checking them and they are elevated, but generally not over 200.  He is avoiding carbohydrates when possible.  He describes the abdominal pain as count of always there.  It is more of a soreness.  This is controlled generally with tramadol.  He tries to only take it at night to help him sleep.   Patient works with CAD software.     Patient has family cancer history of prostate  cancer in father and colon cancer in maternal grandfather.       Cytology 01/18/2023 FINAL MICROSCOPIC DIAGNOSIS:  - Malignant cells present  - Adenocarcinoma    Interval history:    Patient has been undergoing chemotherapy with FOLFIRINOX.  He overall has been doing relatively well.  He has completed 7 cycles.   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 He has completed seven cycles of chemotherapy, with the last few cycles causing increased side effects, including decreased appetite and weight loss. Despite these side effects, the patient has been able to continue working from home. The patient reports a decrease in food intake and a recent weight loss of about five to six pounds over the last two chemotherapy cycles. The patient occasionally takes a 5mg  dose of oxy for pain, about once a week. The patient's weight has been relatively stable until the recent weight loss.     CT abd;/pelvis 05/19/23 IMPRESSION: 1. Pancreatic head mass seen on previous MRI appears substantially decreased in the interval without a discrete mass on the current study. A subtle area of confluent soft tissue in the head of the pancreas measures 1.8 x 1.1 cm, decreased from reported measurements of 2.6 x 2.4 cm on previous MRI. 2. Abnormal soft tissue adjacent to the celiac artery near the bifurcation into the common hepatic and splenic arteries. Sagittal imaging shows abnormal soft tissue posterior and inferior to the common hepatic  artery. 3. Fat plane around the SMA is preserved with apparent focal dissection of the proximal SMA versus saccular aneurysm. 4. Suspicion for abnormal soft tissue abutting the SMV over about 90 degrees. 5. No lymphadenopathy evident in the abdomen by CT size criteria. 6. Left colonic diverticulosis without diverticulitis. 7.  Aortic Atherosclerosis (ICD10-I70.0).   CTA chest/aorta  02/25/23 IMPRESSION: Grossly stable 5.1 cm Ascending thoracic aortic aneurysm. Recommend semi-annual imaging followup by CTA or MRA and referral to cardiothoracic surgery if not already obtained. This recommendation follows 2010 ACCF/AHA/AATS/ACR/ASA/SCA/SCAI/SIR/STS/SVM Guidelines for the Diagnosis and Management of Patients With Thoracic Aortic Disease. Circulation. 2010; 121: K440-N027. Aortic aneurysm NOS (ICD10-I71.9).   Pancreatic head mass is again noted consistent with malignancy, as described on prior MRI. Common bile duct stent is noted in grossly good position.     CMET with Alb 3.4 WBCs 12, HCT 31.3, Plts 102k   Review of Systems: A complete review of systems was obtained from the patient.  I have reviewed this information and discussed as appropriate with the patient.  See HPI as well for other ROS.   Review of Systems  All other systems reviewed and are negative.       Medical History: Past Medical History      Past Medical History:  Diagnosis Date   Aneurysm (CMS-HCC)     Chronic kidney disease     History of cancer     Hyperlipidemia     Hypertension     Thyroid disease          Problem List     Patient Active Problem List  Diagnosis   Adenocarcinoma of head of pancreas (CMS/HHS-HCC)   Obstructive jaundice (CMS-HCC)   Severe protein-calorie malnutrition (CMS/HHS-HCC)        Past Surgical History       Past Surgical History:  Procedure Laterality Date   Eye Surgery- unsure what kind   1966   EXCISION MASS NECK AND RIGHT SHOULDER   04/26/2015    Dr. Sheron Nightingale        Allergies       Allergies  Allergen Reactions   Sertraline Hcl Other (See Comments)      HYPERACTIVE   Quinolones Other (See Comments)      Aortic aneurysm--Did not feel well        Medications Ordered Prior to Encounter        Current Outpatient Medications on File Prior to Visit  Medication Sig Dispense Refill   atorvastatin (LIPITOR) 40 MG tablet Take 1 tablet by  mouth once daily       enalapril (VASOTEC) 10 MG tablet Take 1 tablet by mouth once daily       levothyroxine (SYNTHROID) 75 MCG tablet Take  1 tablet  Daily  on an empty stomach with only water for 30 minutes & no Antacid meds, Calcium or Magnesium for 4 hours & avoid Biotin        No current facility-administered medications on file prior to visit.        Family History       Family History  Problem Relation Age of Onset   Obesity Mother     Depression Mother     High blood pressure (Hypertension) Father     Hyperlipidemia (Elevated cholesterol) Father     Coronary Artery Disease (Blocked arteries around heart) Father     Colon cancer Maternal Uncle          Tobacco Use History  Social  History        Tobacco Use  Smoking Status Former   Types: Cigarettes  Smokeless Tobacco Never        Social History  Social History         Socioeconomic History   Marital status: Married  Tobacco Use   Smoking status: Former      Types: Cigarettes   Smokeless tobacco: Never  Vaping Use   Vaping status: Never Used  Substance and Sexual Activity   Alcohol use: Yes      Comment: 1-2 weekly   Drug use: Never    Social Drivers of Health        Food Insecurity: No Food Insecurity (01/27/2023)    Received from [facility]    Hunger Vital Sign     Worried About Running Out of Food in the Last Year: Never true     Ran Out of Food in the Last Year: Never true  Transportation Needs: No Transportation Needs (01/27/2023)    Received from [facility] - Transportation     Lack of Transportation (Medical): No     Lack of Transportation (Non-Medical): No  Housing Stability: Unknown (06/03/2023)    Housing Stability Vital Sign     Homeless in the Last Year: No        Objective:         Vitals:      BP: 117/79  Pulse: 80  Temp: 36.7 C (98.1 F)  SpO2: 99%  Weight: 62.3 kg (137 lb 6.4 oz)  Height: 172.7 cm (5\' 8" )  PainSc:   4    Body mass index is 20.89  kg/m.   Head:   Normocephalic and atraumatic.  Eyes:    Conjunctivae are normal. Pupils are equal, round, and reactive to light. No scleral icterus.  Neck:   Normal range of motion. Neck supple. No tracheal deviation present. No thyromegaly present.  Resp:No respiratory distress, normal effort. Abd:      Abdomen is soft, non distended and non tender. No masses are palpable.  There is no rebound and no guarding.  Neurological: Alert and oriented to person, place, and time. Coordination normal.  Skin:    Skin is warm and dry. No rash noted. No diaphoretic. No erythema. No pallor.  Psychiatric: Normal mood and affect. Normal behavior. Judgment and thought content normal.      Labs, Imaging and Diagnostic Testing:   See above.      Assessment and Plan:      Assessment Diagnoses and all orders for this visit:   Adenocarcinoma of head of pancreas (CMS/HHS-HCC)   Severe protein-calorie malnutrition (CMS/HHS-HCC)     Assessment & Plan Pancreatic Cancer Completed seven cycles of chemotherapy with good tumor response but recent development of abnormal soft tissue around blood vessels. Unclear if this represents cancer or scarring/swelling from tumor response. Recent weight loss and decreased appetite. -Complete eighth cycle of chemotherapy on 06/10/2023. -Plan for surgery 3-4 weeks post-chemotherapy, with a focus on protein intake and weight stabilization in the interim. -Consider placement of feeding tube during surgery to assist with post-operative nutrition. -Prepare for potential complications including pancreatic leak and delayed gastric emptying.   General Health Maintenance -Encourage increased protein intake and calorie consumption to prevent further weight loss. -Plan for pre-operative appointment with anesthesia.         I discussed the surgery with the patient including diagrams of anatomy.  I discussed the potential for diagnostic laparoscopy.  In the case of pancreatic  cancer, if spread of the disease is found, we will abort the procedure and not proceed with resection.  The rationale for this was discussed with the patient.  There has not been data to support resection of Stage IV disease in terms of survival benefit.     We discussed possible complications including: Potential of aborting procedure if tumor is invading the superior mesenteric or hepatic arteries Bleeding Infection and possible wound complications such as hernia Damage to adjacent structures Leak of anastamoses, primarily pancreatic Possible need for other procedures Possible prolonged nausea with possible need for external feeding.   Possible prolonged hospital stay. Possible development of diabetes or worsening of current diabetes.  Possible pancreatic exocrine insufficiency Prolonged fatigue/weakness/appetite Possible early recurrence of cancer     The patient understands and wishes to proceed.  The patient has been advised to turn in disability paperwork to our office.       Return in about 4 weeks (around 07/01/2023) for for surgery.

## 2023-07-02 ENCOUNTER — Inpatient Hospital Stay (HOSPITAL_COMMUNITY)
Admission: RE | Admit: 2023-07-02 | Discharge: 2023-07-15 | DRG: 405 | Disposition: E | Payer: BC Managed Care – PPO | Attending: General Surgery | Admitting: General Surgery

## 2023-07-02 ENCOUNTER — Other Ambulatory Visit: Payer: Self-pay

## 2023-07-02 ENCOUNTER — Encounter (HOSPITAL_COMMUNITY): Admission: RE | Disposition: E | Payer: Self-pay | Source: Home / Self Care | Attending: General Surgery

## 2023-07-02 ENCOUNTER — Inpatient Hospital Stay (HOSPITAL_COMMUNITY): Payer: Self-pay | Admitting: Physician Assistant

## 2023-07-02 DIAGNOSIS — Z66 Do not resuscitate: Secondary | ICD-10-CM | POA: Diagnosis not present

## 2023-07-02 DIAGNOSIS — D6481 Anemia due to antineoplastic chemotherapy: Secondary | ICD-10-CM | POA: Diagnosis present

## 2023-07-02 DIAGNOSIS — I4901 Ventricular fibrillation: Secondary | ICD-10-CM | POA: Diagnosis not present

## 2023-07-02 DIAGNOSIS — I739 Peripheral vascular disease, unspecified: Secondary | ICD-10-CM | POA: Diagnosis present

## 2023-07-02 DIAGNOSIS — Z452 Encounter for adjustment and management of vascular access device: Secondary | ICD-10-CM | POA: Diagnosis not present

## 2023-07-02 DIAGNOSIS — N400 Enlarged prostate without lower urinary tract symptoms: Secondary | ICD-10-CM | POA: Diagnosis not present

## 2023-07-02 DIAGNOSIS — K219 Gastro-esophageal reflux disease without esophagitis: Secondary | ICD-10-CM | POA: Diagnosis present

## 2023-07-02 DIAGNOSIS — I10 Essential (primary) hypertension: Secondary | ICD-10-CM | POA: Diagnosis present

## 2023-07-02 DIAGNOSIS — E872 Acidosis, unspecified: Secondary | ICD-10-CM | POA: Diagnosis not present

## 2023-07-02 DIAGNOSIS — E861 Hypovolemia: Secondary | ICD-10-CM | POA: Diagnosis not present

## 2023-07-02 DIAGNOSIS — E43 Unspecified severe protein-calorie malnutrition: Secondary | ICD-10-CM | POA: Diagnosis not present

## 2023-07-02 DIAGNOSIS — R578 Other shock: Secondary | ICD-10-CM | POA: Diagnosis not present

## 2023-07-02 DIAGNOSIS — D62 Acute posthemorrhagic anemia: Secondary | ICD-10-CM | POA: Diagnosis not present

## 2023-07-02 DIAGNOSIS — K831 Obstruction of bile duct: Secondary | ICD-10-CM | POA: Diagnosis present

## 2023-07-02 DIAGNOSIS — N182 Chronic kidney disease, stage 2 (mild): Secondary | ICD-10-CM | POA: Diagnosis not present

## 2023-07-02 DIAGNOSIS — E039 Hypothyroidism, unspecified: Secondary | ICD-10-CM | POA: Diagnosis present

## 2023-07-02 DIAGNOSIS — G9341 Metabolic encephalopathy: Secondary | ICD-10-CM | POA: Diagnosis not present

## 2023-07-02 DIAGNOSIS — D65 Disseminated intravascular coagulation [defibrination syndrome]: Secondary | ICD-10-CM | POA: Diagnosis present

## 2023-07-02 DIAGNOSIS — K7200 Acute and subacute hepatic failure without coma: Secondary | ICD-10-CM | POA: Diagnosis not present

## 2023-07-02 DIAGNOSIS — K6389 Other specified diseases of intestine: Secondary | ICD-10-CM | POA: Diagnosis not present

## 2023-07-02 DIAGNOSIS — Y95 Nosocomial condition: Secondary | ICD-10-CM | POA: Diagnosis not present

## 2023-07-02 DIAGNOSIS — T451X5A Adverse effect of antineoplastic and immunosuppressive drugs, initial encounter: Secondary | ICD-10-CM | POA: Diagnosis present

## 2023-07-02 DIAGNOSIS — I462 Cardiac arrest due to underlying cardiac condition: Secondary | ICD-10-CM | POA: Diagnosis not present

## 2023-07-02 DIAGNOSIS — R6521 Severe sepsis with septic shock: Secondary | ICD-10-CM | POA: Diagnosis not present

## 2023-07-02 DIAGNOSIS — Z6820 Body mass index (BMI) 20.0-20.9, adult: Secondary | ICD-10-CM

## 2023-07-02 DIAGNOSIS — C250 Malignant neoplasm of head of pancreas: Principal | ICD-10-CM | POA: Diagnosis present

## 2023-07-02 DIAGNOSIS — R011 Cardiac murmur, unspecified: Secondary | ICD-10-CM | POA: Diagnosis present

## 2023-07-02 DIAGNOSIS — R4182 Altered mental status, unspecified: Secondary | ICD-10-CM | POA: Diagnosis not present

## 2023-07-02 DIAGNOSIS — R188 Other ascites: Secondary | ICD-10-CM | POA: Diagnosis present

## 2023-07-02 DIAGNOSIS — Z9911 Dependence on respirator [ventilator] status: Secondary | ICD-10-CM | POA: Diagnosis not present

## 2023-07-02 DIAGNOSIS — I70203 Unspecified atherosclerosis of native arteries of extremities, bilateral legs: Secondary | ICD-10-CM | POA: Diagnosis not present

## 2023-07-02 DIAGNOSIS — A419 Sepsis, unspecified organism: Secondary | ICD-10-CM | POA: Diagnosis not present

## 2023-07-02 DIAGNOSIS — K5730 Diverticulosis of large intestine without perforation or abscess without bleeding: Secondary | ICD-10-CM | POA: Diagnosis present

## 2023-07-02 DIAGNOSIS — I350 Nonrheumatic aortic (valve) stenosis: Secondary | ICD-10-CM | POA: Diagnosis present

## 2023-07-02 DIAGNOSIS — Z87891 Personal history of nicotine dependence: Secondary | ICD-10-CM

## 2023-07-02 DIAGNOSIS — R0602 Shortness of breath: Secondary | ICD-10-CM | POA: Diagnosis not present

## 2023-07-02 DIAGNOSIS — J90 Pleural effusion, not elsewhere classified: Secondary | ICD-10-CM | POA: Diagnosis not present

## 2023-07-02 DIAGNOSIS — J189 Pneumonia, unspecified organism: Secondary | ICD-10-CM | POA: Diagnosis not present

## 2023-07-02 DIAGNOSIS — E785 Hyperlipidemia, unspecified: Secondary | ICD-10-CM | POA: Diagnosis present

## 2023-07-02 DIAGNOSIS — J869 Pyothorax without fistula: Secondary | ICD-10-CM | POA: Diagnosis not present

## 2023-07-02 DIAGNOSIS — J9621 Acute and chronic respiratory failure with hypoxia: Secondary | ICD-10-CM | POA: Diagnosis not present

## 2023-07-02 DIAGNOSIS — R109 Unspecified abdominal pain: Secondary | ICD-10-CM | POA: Diagnosis not present

## 2023-07-02 DIAGNOSIS — R7989 Other specified abnormal findings of blood chemistry: Secondary | ICD-10-CM | POA: Diagnosis present

## 2023-07-02 DIAGNOSIS — R7401 Elevation of levels of liver transaminase levels: Secondary | ICD-10-CM | POA: Diagnosis not present

## 2023-07-02 DIAGNOSIS — Z79890 Hormone replacement therapy (postmenopausal): Secondary | ICD-10-CM

## 2023-07-02 DIAGNOSIS — K529 Noninfective gastroenteritis and colitis, unspecified: Secondary | ICD-10-CM | POA: Diagnosis not present

## 2023-07-02 DIAGNOSIS — R001 Bradycardia, unspecified: Secondary | ICD-10-CM | POA: Diagnosis not present

## 2023-07-02 DIAGNOSIS — R739 Hyperglycemia, unspecified: Secondary | ICD-10-CM | POA: Diagnosis present

## 2023-07-02 DIAGNOSIS — I7121 Aneurysm of the ascending aorta, without rupture: Secondary | ICD-10-CM | POA: Diagnosis present

## 2023-07-02 DIAGNOSIS — J9690 Respiratory failure, unspecified, unspecified whether with hypoxia or hypercapnia: Secondary | ICD-10-CM | POA: Diagnosis not present

## 2023-07-02 DIAGNOSIS — E875 Hyperkalemia: Secondary | ICD-10-CM | POA: Diagnosis not present

## 2023-07-02 DIAGNOSIS — K811 Chronic cholecystitis: Secondary | ICD-10-CM | POA: Diagnosis not present

## 2023-07-02 DIAGNOSIS — Z9049 Acquired absence of other specified parts of digestive tract: Secondary | ICD-10-CM | POA: Diagnosis not present

## 2023-07-02 DIAGNOSIS — G8918 Other acute postprocedural pain: Secondary | ICD-10-CM | POA: Diagnosis not present

## 2023-07-02 DIAGNOSIS — I517 Cardiomegaly: Secondary | ICD-10-CM | POA: Diagnosis not present

## 2023-07-02 DIAGNOSIS — J9811 Atelectasis: Secondary | ICD-10-CM | POA: Diagnosis not present

## 2023-07-02 DIAGNOSIS — N281 Cyst of kidney, acquired: Secondary | ICD-10-CM | POA: Diagnosis not present

## 2023-07-02 DIAGNOSIS — Z8614 Personal history of Methicillin resistant Staphylococcus aureus infection: Secondary | ICD-10-CM

## 2023-07-02 DIAGNOSIS — D696 Thrombocytopenia, unspecified: Secondary | ICD-10-CM

## 2023-07-02 DIAGNOSIS — N179 Acute kidney failure, unspecified: Secondary | ICD-10-CM | POA: Diagnosis not present

## 2023-07-02 DIAGNOSIS — N170 Acute kidney failure with tubular necrosis: Secondary | ICD-10-CM | POA: Diagnosis not present

## 2023-07-02 DIAGNOSIS — D6489 Other specified anemias: Secondary | ICD-10-CM | POA: Diagnosis present

## 2023-07-02 DIAGNOSIS — Z8711 Personal history of peptic ulcer disease: Secondary | ICD-10-CM

## 2023-07-02 DIAGNOSIS — J9601 Acute respiratory failure with hypoxia: Secondary | ICD-10-CM | POA: Diagnosis not present

## 2023-07-02 DIAGNOSIS — Z8619 Personal history of other infectious and parasitic diseases: Secondary | ICD-10-CM

## 2023-07-02 DIAGNOSIS — J948 Other specified pleural conditions: Secondary | ICD-10-CM | POA: Diagnosis not present

## 2023-07-02 DIAGNOSIS — Z888 Allergy status to other drugs, medicaments and biological substances status: Secondary | ICD-10-CM

## 2023-07-02 DIAGNOSIS — I700 Atherosclerosis of aorta: Secondary | ICD-10-CM | POA: Diagnosis present

## 2023-07-02 DIAGNOSIS — Z79899 Other long term (current) drug therapy: Secondary | ICD-10-CM

## 2023-07-02 DIAGNOSIS — Z881 Allergy status to other antibiotic agents status: Secondary | ICD-10-CM

## 2023-07-02 DIAGNOSIS — E8729 Other acidosis: Secondary | ICD-10-CM | POA: Diagnosis not present

## 2023-07-02 DIAGNOSIS — I129 Hypertensive chronic kidney disease with stage 1 through stage 4 chronic kidney disease, or unspecified chronic kidney disease: Secondary | ICD-10-CM | POA: Diagnosis not present

## 2023-07-02 DIAGNOSIS — R579 Shock, unspecified: Secondary | ICD-10-CM | POA: Diagnosis not present

## 2023-07-02 DIAGNOSIS — J9600 Acute respiratory failure, unspecified whether with hypoxia or hypercapnia: Secondary | ICD-10-CM

## 2023-07-02 DIAGNOSIS — R918 Other nonspecific abnormal finding of lung field: Secondary | ICD-10-CM | POA: Diagnosis not present

## 2023-07-02 DIAGNOSIS — Z8249 Family history of ischemic heart disease and other diseases of the circulatory system: Secondary | ICD-10-CM

## 2023-07-02 DIAGNOSIS — C259 Malignant neoplasm of pancreas, unspecified: Secondary | ICD-10-CM | POA: Diagnosis not present

## 2023-07-02 DIAGNOSIS — N2889 Other specified disorders of kidney and ureter: Secondary | ICD-10-CM | POA: Diagnosis not present

## 2023-07-02 DIAGNOSIS — Z90410 Acquired total absence of pancreas: Secondary | ICD-10-CM | POA: Diagnosis not present

## 2023-07-02 DIAGNOSIS — Z4682 Encounter for fitting and adjustment of non-vascular catheter: Secondary | ICD-10-CM | POA: Diagnosis not present

## 2023-07-02 DIAGNOSIS — R195 Other fecal abnormalities: Secondary | ICD-10-CM | POA: Diagnosis not present

## 2023-07-02 HISTORY — PX: WHIPPLE PROCEDURE: SHX2667

## 2023-07-02 HISTORY — PX: LAPAROSCOPY: SHX197

## 2023-07-02 HISTORY — PX: JEJUNOSTOMY: SHX313

## 2023-07-02 LAB — BPAM FFP
Blood Product Expiration Date: 202503192359
Blood Product Expiration Date: 202503192359
ISSUE DATE / TIME: 202503190917
ISSUE DATE / TIME: 202503190917
Unit Type and Rh: 600
Unit Type and Rh: 600

## 2023-07-02 LAB — POCT I-STAT 7, (LYTES, BLD GAS, ICA,H+H)
Acid-base deficit: 1 mmol/L (ref 0.0–2.0)
Acid-base deficit: 2 mmol/L (ref 0.0–2.0)
Bicarbonate: 24.3 mmol/L (ref 20.0–28.0)
Bicarbonate: 23.1 mmol/L (ref 20.0–28.0)
Calcium, Ion: 1.28 mmol/L (ref 1.15–1.40)
Calcium, Ion: 1.19 mmol/L (ref 1.15–1.40)
HCT: 23 % — ABNORMAL LOW (ref 39.0–52.0)
HCT: 24 % — ABNORMAL LOW (ref 39.0–52.0)
Hemoglobin: 7.8 g/dL — ABNORMAL LOW (ref 13.0–17.0)
Hemoglobin: 8.2 g/dL — ABNORMAL LOW (ref 13.0–17.0)
O2 Saturation: 100 %
O2 Saturation: 100 %
Patient temperature: 35.1
Patient temperature: 36.5
Potassium: 3.6 mmol/L (ref 3.5–5.1)
Potassium: 3.8 mmol/L (ref 3.5–5.1)
Sodium: 141 mmol/L (ref 135–145)
Sodium: 138 mmol/L (ref 135–145)
TCO2: 26 mmol/L (ref 22–32)
TCO2: 24 mmol/L (ref 22–32)
pCO2 arterial: 41.2 mmHg (ref 32–48)
pCO2 arterial: 39.4 mmHg (ref 32–48)
pH, Arterial: 7.371 (ref 7.35–7.45)
pH, Arterial: 7.375 (ref 7.35–7.45)
pO2, Arterial: 257 mmHg — ABNORMAL HIGH (ref 83–108)
pO2, Arterial: 219 mmHg — ABNORMAL HIGH (ref 83–108)

## 2023-07-02 LAB — BASIC METABOLIC PANEL
Anion gap: 9 (ref 5–15)
BUN: 11 mg/dL (ref 8–23)
CO2: 21 mmol/L — ABNORMAL LOW (ref 22–32)
Calcium: 8 mg/dL — ABNORMAL LOW (ref 8.9–10.3)
Chloride: 106 mmol/L (ref 98–111)
Creatinine, Ser: 0.73 mg/dL (ref 0.61–1.24)
GFR, Estimated: >60 mL/min (ref >60)
Glucose, Bld: 166 mg/dL — ABNORMAL HIGH (ref 70–99)
Potassium: 4 mmol/L (ref 3.5–5.1)
Sodium: 136 mmol/L (ref 135–145)

## 2023-07-02 LAB — PREPARE FRESH FROZEN PLASMA: Unit division: 0

## 2023-07-02 LAB — PREPARE RBC (CROSSMATCH)

## 2023-07-02 LAB — CBC
HCT: 25.8 % — ABNORMAL LOW (ref 39.0–52.0)
Hemoglobin: 8.2 g/dL — ABNORMAL LOW (ref 13.0–17.0)
MCH: 31.9 pg (ref 26.0–34.0)
MCHC: 31.8 g/dL (ref 30.0–36.0)
MCV: 100.4 fL — ABNORMAL HIGH (ref 80.0–100.0)
Platelets: 132 10*3/uL — ABNORMAL LOW (ref 150–400)
RBC: 2.57 MIL/uL — ABNORMAL LOW (ref 4.22–5.81)
RDW: 23.9 % — ABNORMAL HIGH (ref 11.5–15.5)
WBC: 13.2 10*3/uL — ABNORMAL HIGH (ref 4.0–10.5)
nRBC: 0 % (ref 0.0–0.2)

## 2023-07-02 LAB — MRSA NEXT GEN BY PCR, NASAL: MRSA by PCR Next Gen: NOT DETECTED

## 2023-07-02 LAB — CYTOLOGY - NON PAP

## 2023-07-02 LAB — ABO/RH: ABO/RH(D): A POS

## 2023-07-02 SURGERY — LAPAROSCOPY, DIAGNOSTIC
Anesthesia: Epidural

## 2023-07-02 MED ORDER — ONDANSETRON HCL 4 MG/2ML IJ SOLN
INTRAMUSCULAR | Status: AC
  Filled 2023-07-02: qty 2

## 2023-07-02 MED ORDER — VITAL HIGH PROTEIN PO LIQD: 1000.0000 mL | ORAL | Status: DC

## 2023-07-02 MED ORDER — ORAL CARE MOUTH RINSE
15.0000 mL | OROMUCOSAL | Status: DC
  Administered 2023-07-02 – 2023-07-07 (×20): 15 mL via OROMUCOSAL

## 2023-07-02 MED ORDER — ALBUMIN HUMAN 5 % IV SOLN
INTRAVENOUS | Status: DC | PRN
Start: 2023-07-02 — End: 2023-07-02

## 2023-07-02 MED ORDER — SODIUM CHLORIDE 0.9% IV SOLUTION
Freq: Once | INTRAVENOUS | Status: AC
  Administered 2023-07-02 (×2): 75 mL/h via INTRAVENOUS

## 2023-07-02 MED ORDER — CEFAZOLIN SODIUM 1 G IJ SOLR
INTRAMUSCULAR | Status: AC
  Filled 2023-07-02: qty 20

## 2023-07-02 MED ORDER — ACETAMINOPHEN 10 MG/ML IV SOLN
1000.0000 mg | Freq: Four times a day (QID) | INTRAVENOUS | Status: AC
  Administered 2023-07-02 – 2023-07-03 (×3): 1000 mg via INTRAVENOUS
  Filled 2023-07-02 (×3): qty 100

## 2023-07-02 MED ORDER — SODIUM CHLORIDE 0.9% FLUSH: 9.0000 mL | INTRAVENOUS | Status: DC | PRN

## 2023-07-02 MED ORDER — MORPHINE SULFATE 1 MG/ML IV SOLN PCA
INTRAVENOUS | Status: DC
  Administered 2023-07-02: 3 mg via INTRAVENOUS
  Administered 2023-07-03: 1.5 mg via INTRAVENOUS
  Administered 2023-07-03 (×2): 4.5 mg via INTRAVENOUS
  Administered 2023-07-04 (×2): 6 mg via INTRAVENOUS
  Filled 2023-07-02 (×2): qty 30

## 2023-07-02 MED ORDER — ROCURONIUM BROMIDE 10 MG/ML (PF) SYRINGE
PREFILLED_SYRINGE | INTRAVENOUS | Status: AC
  Filled 2023-07-02: qty 10

## 2023-07-02 MED ORDER — LIDOCAINE HCL 1 % IJ SOLN
INTRAMUSCULAR | Status: AC
  Filled 2023-07-02: qty 20

## 2023-07-02 MED ORDER — ONDANSETRON 4 MG PO TBDP
4.0000 mg | ORAL_TABLET | Freq: Four times a day (QID) | ORAL | Status: DC | PRN
  Administered 2023-07-05: 4 mg via ORAL
  Filled 2023-07-02: qty 1

## 2023-07-02 MED ORDER — ROCURONIUM BROMIDE 10 MG/ML (PF) SYRINGE
PREFILLED_SYRINGE | INTRAVENOUS | Status: DC | PRN
  Administered 2023-07-02: 10 mg via INTRAVENOUS
  Administered 2023-07-02: 50 mg via INTRAVENOUS
  Administered 2023-07-02 (×2): 20 mg via INTRAVENOUS
  Administered 2023-07-02: 50 mg via INTRAVENOUS
  Administered 2023-07-02: 20 mg via INTRAVENOUS
  Administered 2023-07-02 (×2): 10 mg via INTRAVENOUS

## 2023-07-02 MED ORDER — PROCHLORPERAZINE MALEATE 10 MG PO TABS
10.0000 mg | ORAL_TABLET | Freq: Four times a day (QID) | ORAL | Status: DC | PRN
  Administered 2023-07-05: 10 mg via ORAL
  Filled 2023-07-02 (×2): qty 1

## 2023-07-02 MED ORDER — ORAL CARE MOUTH RINSE: 15.0000 mL | Freq: Once | OROMUCOSAL | Status: AC

## 2023-07-02 MED ORDER — METOPROLOL TARTRATE 5 MG/5ML IV SOLN: 5.0000 mg | Freq: Four times a day (QID) | INTRAVENOUS | Status: DC | PRN

## 2023-07-02 MED ORDER — LIDOCAINE-EPINEPHRINE (PF) 2 %-1:200000 IJ SOLN
INTRAMUSCULAR | Status: DC | PRN
  Administered 2023-07-02: 3 mL via EPIDURAL

## 2023-07-02 MED ORDER — HYDROMORPHONE HCL 1 MG/ML IJ SOLN
0.2500 mg | INTRAMUSCULAR | Status: DC | PRN
  Administered 2023-07-02: 0.25 mg via INTRAVENOUS
  Administered 2023-07-02: .5 mg via INTRAVENOUS

## 2023-07-02 MED ORDER — SUGAMMADEX SODIUM 200 MG/2ML IV SOLN
INTRAVENOUS | Status: AC
  Filled 2023-07-02: qty 2

## 2023-07-02 MED ORDER — ONDANSETRON HCL 4 MG/2ML IJ SOLN
4.0000 mg | Freq: Four times a day (QID) | INTRAMUSCULAR | Status: DC | PRN
  Administered 2023-07-03 – 2023-07-05 (×4): 4 mg via INTRAVENOUS
  Filled 2023-07-02 (×3): qty 2

## 2023-07-02 MED ORDER — OXYCODONE HCL 5 MG PO TABS: 5.0000 mg | ORAL_TABLET | Freq: Once | ORAL | Status: DC | PRN

## 2023-07-02 MED ORDER — CHLORHEXIDINE GLUCONATE CLOTH 2 % EX PADS: 6.0000 | MEDICATED_PAD | Freq: Once | CUTANEOUS | Status: DC

## 2023-07-02 MED ORDER — CHLORHEXIDINE GLUCONATE CLOTH 2 % EX PADS
6.0000 | MEDICATED_PAD | Freq: Every day | CUTANEOUS | Status: DC
  Administered 2023-07-02 – 2023-07-09 (×7): 6 via TOPICAL

## 2023-07-02 MED ORDER — CEFAZOLIN SODIUM-DEXTROSE 2-4 GM/100ML-% IV SOLN
2.0000 g | Freq: Three times a day (TID) | INTRAVENOUS | Status: AC
  Administered 2023-07-02: 2 g via INTRAVENOUS
  Filled 2023-07-02: qty 100

## 2023-07-02 MED ORDER — ROPIVACAINE HCL 2 MG/ML IJ SOLN
8.0000 mL/h | INTRAMUSCULAR | Status: DC
  Administered 2023-07-03 – 2023-07-06 (×4): 8 mL/h via EPIDURAL
  Filled 2023-07-02 (×9): qty 200

## 2023-07-02 MED ORDER — METHOCARBAMOL 1000 MG/10ML IJ SOLN
500.0000 mg | Freq: Three times a day (TID) | INTRAMUSCULAR | Status: DC | PRN
Start: 2023-07-02 — End: 2023-07-07

## 2023-07-02 MED ORDER — VISTASEAL 10 ML SINGLE DOSE KIT
10.0000 mL | PACK | Freq: Once | CUTANEOUS | Status: AC
  Administered 2023-07-02: 10 mL via TOPICAL
  Filled 2023-07-02 (×2): qty 10

## 2023-07-02 MED ORDER — ROPIVACAINE HCL 2 MG/ML IJ SOLN
8.0000 mL/h | INTRAMUSCULAR | Status: DC
  Administered 2023-07-02: 8 mL/h via EPIDURAL
  Administered 2023-07-02 (×2): 8 mg via EPIDURAL
  Filled 2023-07-02: qty 100

## 2023-07-02 MED ORDER — WATER FOR IRRIGATION, STERILE IR SOLN
Status: DC | PRN
  Administered 2023-07-02: 1000 mL via SURGICAL_CAVITY

## 2023-07-02 MED ORDER — HYDROMORPHONE HCL 1 MG/ML IJ SOLN
INTRAMUSCULAR | Status: AC
  Filled 2023-07-02: qty 1

## 2023-07-02 MED ORDER — DEXAMETHASONE SODIUM PHOSPHATE 10 MG/ML IJ SOLN
INTRAMUSCULAR | Status: DC | PRN
  Administered 2023-07-02: 10 mg via INTRAVENOUS

## 2023-07-02 MED ORDER — ACETAMINOPHEN 10 MG/ML IV SOLN
INTRAVENOUS | Status: DC | PRN
  Administered 2023-07-02: 1000 mg via INTRAVENOUS

## 2023-07-02 MED ORDER — HEPARIN SODIUM (PORCINE) 5000 UNIT/ML IJ SOLN
5000.0000 [IU] | Freq: Three times a day (TID) | INTRAMUSCULAR | Status: DC
Start: 2023-07-02 — End: 2023-07-04
  Administered 2023-07-02 – 2023-07-03 (×4): 5000 [IU] via SUBCUTANEOUS
  Filled 2023-07-02 (×4): qty 1

## 2023-07-02 MED ORDER — NOREPINEPHRINE 4 MG/250ML-% IV SOLN
2.0000 ug/min | INTRAVENOUS | Status: DC
  Filled 2023-07-02: qty 250

## 2023-07-02 MED ORDER — FENTANYL CITRATE (PF) 250 MCG/5ML IJ SOLN
INTRAMUSCULAR | Status: DC | PRN
  Administered 2023-07-02 (×2): 50 ug via INTRAVENOUS
  Administered 2023-07-02: 100 ug via INTRAVENOUS
  Administered 2023-07-02: 50 ug via INTRAVENOUS

## 2023-07-02 MED ORDER — CHLORHEXIDINE GLUCONATE 0.12 % MT SOLN
15.0000 mL | Freq: Once | OROMUCOSAL | Status: AC
  Administered 2023-07-02: 15 mL via OROMUCOSAL
  Filled 2023-07-02: qty 15

## 2023-07-02 MED ORDER — ALBUMIN HUMAN 5 % IV SOLN
25.0000 g | Freq: Once | INTRAVENOUS | Status: AC
  Administered 2023-07-02: 25 g via INTRAVENOUS
  Filled 2023-07-02: qty 500

## 2023-07-02 MED ORDER — PROCHLORPERAZINE EDISYLATE 10 MG/2ML IJ SOLN
5.0000 mg | Freq: Four times a day (QID) | INTRAMUSCULAR | Status: DC | PRN
  Administered 2023-07-03: 10 mg via INTRAVENOUS
  Filled 2023-07-02: qty 2

## 2023-07-02 MED ORDER — SODIUM CHLORIDE 0.9 % IR SOLN
Status: DC | PRN
  Administered 2023-07-02: 1000 mL

## 2023-07-02 MED ORDER — HYDROMORPHONE HCL 1 MG/ML IJ SOLN
INTRAMUSCULAR | Status: AC
  Filled 2023-07-02: qty 0.5

## 2023-07-02 MED ORDER — SODIUM CHLORIDE 0.9 % IV SOLN: 25.0000 ug/min | INTRAVENOUS | Status: DC

## 2023-07-02 MED ORDER — CEFAZOLIN SODIUM-DEXTROSE 2-4 GM/100ML-% IV SOLN
2.0000 g | INTRAVENOUS | Status: AC
  Administered 2023-07-02 (×2): 2 g via INTRAVENOUS
  Filled 2023-07-02: qty 100

## 2023-07-02 MED ORDER — OXYCODONE HCL 5 MG/5ML PO SOLN: 5.0000 mg | Freq: Once | ORAL | Status: DC | PRN

## 2023-07-02 MED ORDER — ONDANSETRON HCL 4 MG/2ML IJ SOLN
INTRAMUSCULAR | Status: DC | PRN
  Administered 2023-07-02: 4 mg via INTRAVENOUS

## 2023-07-02 MED ORDER — FENTANYL CITRATE (PF) 250 MCG/5ML IJ SOLN
INTRAMUSCULAR | Status: AC
  Filled 2023-07-02: qty 5

## 2023-07-02 MED ORDER — DEXAMETHASONE SODIUM PHOSPHATE 10 MG/ML IJ SOLN
INTRAMUSCULAR | Status: AC
  Filled 2023-07-02: qty 1

## 2023-07-02 MED ORDER — ONDANSETRON HCL 4 MG/2ML IJ SOLN: 4.0000 mg | Freq: Four times a day (QID) | INTRAMUSCULAR | Status: DC | PRN

## 2023-07-02 MED ORDER — NALOXONE HCL 0.4 MG/ML IJ SOLN: 0.4000 mg | INTRAMUSCULAR | Status: DC | PRN

## 2023-07-02 MED ORDER — ORAL CARE MOUTH RINSE: 15.0000 mL | OROMUCOSAL | Status: DC | PRN

## 2023-07-02 MED ORDER — PANTOPRAZOLE SODIUM 40 MG IV SOLR
40.0000 mg | Freq: Every day | INTRAVENOUS | Status: DC
  Administered 2023-07-02 – 2023-07-04 (×3): 40 mg via INTRAVENOUS
  Filled 2023-07-02 (×3): qty 10

## 2023-07-02 MED ORDER — DIPHENHYDRAMINE HCL 12.5 MG/5ML PO ELIX: 12.5000 mg | ORAL_SOLUTION | Freq: Four times a day (QID) | ORAL | Status: DC | PRN

## 2023-07-02 MED ORDER — LACTATED RINGERS IV SOLN: INTRAVENOUS | Status: DC

## 2023-07-02 MED ORDER — DIPHENHYDRAMINE HCL 50 MG/ML IJ SOLN: 12.5000 mg | Freq: Four times a day (QID) | INTRAMUSCULAR | Status: DC | PRN

## 2023-07-02 MED ORDER — PHENYLEPHRINE 80 MCG/ML (10ML) SYRINGE FOR IV PUSH (FOR BLOOD PRESSURE SUPPORT)
PREFILLED_SYRINGE | INTRAVENOUS | Status: DC | PRN
  Administered 2023-07-02: 80 ug via INTRAVENOUS
  Administered 2023-07-02: 160 ug via INTRAVENOUS
  Administered 2023-07-02 (×6): 80 ug via INTRAVENOUS

## 2023-07-02 MED ORDER — LIDOCAINE 2% (20 MG/ML) 5 ML SYRINGE
INTRAMUSCULAR | Status: AC
  Filled 2023-07-02: qty 5

## 2023-07-02 MED ORDER — MIDAZOLAM HCL 2 MG/2ML IJ SOLN
INTRAMUSCULAR | Status: AC
  Filled 2023-07-02: qty 2

## 2023-07-02 MED ORDER — ACETAMINOPHEN 500 MG PO TABS
1000.0000 mg | ORAL_TABLET | ORAL | Status: AC
  Administered 2023-07-02: 1000 mg via ORAL
  Filled 2023-07-02: qty 2

## 2023-07-02 MED ORDER — MIDAZOLAM HCL 2 MG/2ML IJ SOLN
INTRAMUSCULAR | Status: DC | PRN
  Administered 2023-07-02 (×2): 1 mg via INTRAVENOUS

## 2023-07-02 MED ORDER — SUGAMMADEX SODIUM 200 MG/2ML IV SOLN
INTRAVENOUS | Status: DC | PRN
  Administered 2023-07-02 (×2): 200 mg via INTRAVENOUS

## 2023-07-02 MED ORDER — PROPOFOL 10 MG/ML IV BOLUS
INTRAVENOUS | Status: AC
  Filled 2023-07-02: qty 20

## 2023-07-02 MED ORDER — KCL IN DEXTROSE-NACL 20-5-0.45 MEQ/L-%-% IV SOLN
INTRAVENOUS | Status: AC
  Filled 2023-07-02 (×2): qty 1000

## 2023-07-02 MED ORDER — 0.9 % SODIUM CHLORIDE (POUR BTL) OPTIME
TOPICAL | Status: DC | PRN
  Administered 2023-07-02: 1000 mL

## 2023-07-02 MED ORDER — SODIUM CHLORIDE 0.9% IV SOLUTION: Freq: Once | INTRAVENOUS | Status: AC

## 2023-07-02 MED ORDER — LIDOCAINE 2% (20 MG/ML) 5 ML SYRINGE
INTRAMUSCULAR | Status: DC | PRN
  Administered 2023-07-02: 60 mg via INTRAVENOUS

## 2023-07-02 MED ORDER — PROPOFOL 10 MG/ML IV BOLUS
INTRAVENOUS | Status: DC | PRN
  Administered 2023-07-02: 120 mg via INTRAVENOUS

## 2023-07-02 MED ORDER — PHENYLEPHRINE HCL-NACL 20-0.9 MG/250ML-% IV SOLN
INTRAVENOUS | Status: DC | PRN
  Administered 2023-07-02: 30 ug/min via INTRAVENOUS

## 2023-07-02 MED ORDER — BUPIVACAINE-EPINEPHRINE (PF) 0.25% -1:200000 IJ SOLN
INTRAMUSCULAR | Status: AC
  Filled 2023-07-02: qty 30

## 2023-07-02 MED ORDER — SODIUM CHLORIDE 0.9 % IV SOLN: 250.0000 mL | INTRAVENOUS | Status: DC

## 2023-07-02 MED ORDER — SUCCINYLCHOLINE CHLORIDE 200 MG/10ML IV SOSY
PREFILLED_SYRINGE | INTRAVENOUS | Status: AC
  Filled 2023-07-02: qty 10

## 2023-07-02 MED ORDER — LIDOCAINE HCL 1 % IJ SOLN
INTRAMUSCULAR | Status: DC | PRN
  Administered 2023-07-02: 4 mL

## 2023-07-02 SURGICAL SUPPLY — 118 items
BAG BILE T-TUBES STRL (MISCELLANEOUS) ×2 IMPLANT
BAG COUNTER SPONGE SURGICOUNT (BAG) ×1 IMPLANT
BIOPATCH RED 1 DISK 7.0 (GAUZE/BANDAGES/DRESSINGS) ×2 IMPLANT
BLADE CLIPPER SURG (BLADE) IMPLANT
BLADE SURG 10 STRL SS (BLADE) ×1 IMPLANT
CANISTER SUCT 3000ML PPV (MISCELLANEOUS) ×1 IMPLANT
CATH KIT ON-Q SILVERSOAK 7.5 (CATHETERS) IMPLANT
CATH KIT ON-Q SILVERSOAK 7.5IN (CATHETERS) IMPLANT
CATH ROBINSON RED A/P 16FR (CATHETERS) IMPLANT
CHLORAPREP W/TINT 26 (MISCELLANEOUS) ×1 IMPLANT
CLAMP SUTURE YELLOW 5 PAIRS (MISCELLANEOUS) ×2 IMPLANT
CLIP LIGATING HEM O LOK PURPLE (MISCELLANEOUS) ×2 IMPLANT
CLIP LIGATING HEMO O LOK GREEN (MISCELLANEOUS) ×1 IMPLANT
CLIP LIGATING HEMOLOK MED (MISCELLANEOUS) ×1 IMPLANT
CLIP TI LARGE 6 (CLIP) ×1 IMPLANT
CLIP TI MEDIUM 24 (CLIP) ×1 IMPLANT
CNTNR URN SCR LID CUP LEK RST (MISCELLANEOUS) IMPLANT
COUNTER NDL 20CT MAGNET RED (NEEDLE) IMPLANT
COVER MAYO STAND STRL (DRAPES) ×1 IMPLANT
COVER SURGICAL LIGHT HANDLE (MISCELLANEOUS) ×1 IMPLANT
DERMABOND ADVANCED .7 DNX12 (GAUZE/BANDAGES/DRESSINGS) ×1 IMPLANT
DRAIN CHANNEL 19F RND (DRAIN) ×2 IMPLANT
DRAIN PENROSE 0.5X18 (DRAIN) IMPLANT
DRAPE LAPAROSCOPIC ABDOMINAL (DRAPES) ×1 IMPLANT
DRAPE WARM FLUID 44X44 (DRAPES) ×1 IMPLANT
DRSG COVADERM 4X10 (GAUZE/BANDAGES/DRESSINGS) IMPLANT
DRSG COVADERM 4X14 (GAUZE/BANDAGES/DRESSINGS) IMPLANT
DRSG COVADERM 4X6 (GAUZE/BANDAGES/DRESSINGS) IMPLANT
DRSG COVADERM 4X8 (GAUZE/BANDAGES/DRESSINGS) IMPLANT
DRSG COVADERM PLUS 2X2 (GAUZE/BANDAGES/DRESSINGS) ×1 IMPLANT
DRSG TEGADERM 4X4.75 (GAUZE/BANDAGES/DRESSINGS) ×1 IMPLANT
DRSG TELFA 3X8 NADH STRL (GAUZE/BANDAGES/DRESSINGS) IMPLANT
ELECT BLADE 4.0 EZ CLEAN MEGAD (MISCELLANEOUS) IMPLANT
ELECT BLADE 6.5 EXT (BLADE) ×1 IMPLANT
ELECT CAUTERY BLADE 6.4 (BLADE) ×1 IMPLANT
ELECT REM PT RETURN 9FT ADLT (ELECTROSURGICAL) ×1 IMPLANT
ELECTRODE BLDE 4.0 EZ CLN MEGD (MISCELLANEOUS) IMPLANT
ELECTRODE REM PT RTRN 9FT ADLT (ELECTROSURGICAL) ×1 IMPLANT
GAUZE 4X4 16PLY ~~LOC~~+RFID DBL (SPONGE) IMPLANT
GAUZE SPONGE 4X4 12PLY STRL (GAUZE/BANDAGES/DRESSINGS) IMPLANT
GEL ULTRASOUND 20GR AQUASONIC (MISCELLANEOUS) IMPLANT
GLOVE BIO SURGEON STRL SZ 6 (GLOVE) ×2 IMPLANT
GLOVE INDICATOR 6.5 STRL GRN (GLOVE) ×2 IMPLANT
GOWN STRL REUS W/ TWL LRG LVL3 (GOWN DISPOSABLE) ×2 IMPLANT
GOWN STRL REUS W/ TWL XL LVL3 (GOWN DISPOSABLE) ×2 IMPLANT
HANDLE SUCTION POOLE (INSTRUMENTS) IMPLANT
HEMOSTAT SURGICEL 2X14 (HEMOSTASIS) IMPLANT
IRRIG SUCT STRYKERFLOW 2 WTIP (MISCELLANEOUS) ×1 IMPLANT
IRRIGATION SUCT STRKRFLW 2 WTP (MISCELLANEOUS) IMPLANT
J-TUBE MIC 16FX51 UNV ENFIT (TUBING) ×1 IMPLANT
KIT BASIN OR (CUSTOM PROCEDURE TRAY) ×1 IMPLANT
KIT MARKER MARGIN INK (KITS) ×1 IMPLANT
KIT TURNOVER KIT B (KITS) ×1 IMPLANT
L-HOOK LAP DISP 36CM (ELECTROSURGICAL) ×1 IMPLANT
LHOOK LAP DISP 36CM (ELECTROSURGICAL) ×1 IMPLANT
LOOP VASCLR MAXI BLUE 18IN ST (MISCELLANEOUS) ×1 IMPLANT
NDL 22X1.5 STRL (OR ONLY) (MISCELLANEOUS) ×1 IMPLANT
NEEDLE 22X1.5 STRL (OR ONLY) (MISCELLANEOUS) ×1 IMPLANT
NS IRRIG 1000ML POUR BTL (IV SOLUTION) ×2 IMPLANT
PACK GENERAL/GYN (CUSTOM PROCEDURE TRAY) ×1 IMPLANT
PAD ARMBOARD POSITIONER FOAM (MISCELLANEOUS) ×2 IMPLANT
PENCIL BUTTON HOLSTER BLD 10FT (ELECTRODE) ×1 IMPLANT
PENCIL SMOKE EVACUATOR (MISCELLANEOUS) ×1 IMPLANT
PLUG CATH AND CAP STRL 200 (CATHETERS) IMPLANT
RELOAD PROXIMATE 75MM BLUE (ENDOMECHANICALS) ×2 IMPLANT
RELOAD PROXIMATE 75MM GREEN (ENDOMECHANICALS) IMPLANT
RELOAD STAPLE 75 3.8 BLU REG (ENDOMECHANICALS) IMPLANT
RELOAD STAPLE 75 4.5 GRN THCK (ENDOMECHANICALS) IMPLANT
SCISSORS LAP 5X35 DISP (ENDOMECHANICALS) IMPLANT
SEPRAFILM PROCEDURAL PACK 3X5 (MISCELLANEOUS) IMPLANT
SET TUBE SMOKE EVAC HIGH FLOW (TUBING) ×1 IMPLANT
SHEARS FOC LG CVD HARMONIC 17C (MISCELLANEOUS) ×1 IMPLANT
SLEEVE SUCTION 125 (MISCELLANEOUS) IMPLANT
SLEEVE SUCTION CATH 165 (SLEEVE) ×1 IMPLANT
SLEEVE Z-THREAD 5X100MM (TROCAR) ×1 IMPLANT
SPIKE FLUID TRANSFER (MISCELLANEOUS) ×2 IMPLANT
SPONGE INTESTINAL PEANUT (DISPOSABLE) IMPLANT
SPONGE SURGIFOAM ABS GEL 100 (HEMOSTASIS) IMPLANT
SPONGE T-LAP 18X18 ~~LOC~~+RFID (SPONGE) ×2 IMPLANT
STAPLER PROXIMATE 75MM BLUE (STAPLE) ×1 IMPLANT
STAPLER VISISTAT 35W (STAPLE) ×1 IMPLANT
SUCTION POOLE HANDLE (INSTRUMENTS) IMPLANT
SUT ETHILON 2 0 FS 18 (SUTURE) ×2 IMPLANT
SUT ETHILON 2 LR (SUTURE) IMPLANT
SUT MNCRL AB 4-0 PS2 18 (SUTURE) ×1 IMPLANT
SUT PDS AB 1 TP1 96 (SUTURE) ×2 IMPLANT
SUT PDS AB 3-0 SH 27 (SUTURE) ×4 IMPLANT
SUT PDS AB 4-0 RB1 27 (SUTURE) ×11 IMPLANT
SUT PDS PLUS AB 5-0 RB-1 (SUTURE) ×5 IMPLANT
SUT PROLENE 3 0 SH 48 (SUTURE) ×3 IMPLANT
SUT PROLENE 4-0 RB1 .5 CRCL 36 (SUTURE) ×2 IMPLANT
SUT PROLENE 5 0 RB 1 DA (SUTURE) IMPLANT
SUT SILK 2 0 SH CR/8 (SUTURE) ×1 IMPLANT
SUT SILK 2 0 TIES 10X30 (SUTURE) ×1 IMPLANT
SUT SILK 3 0 SH CR/8 (SUTURE) ×1 IMPLANT
SUT SILK 3 0 TIES 10X30 (SUTURE) ×1 IMPLANT
SUT VIC AB 2-0 CT1 TAPERPNT 27 (SUTURE) IMPLANT
SUT VIC AB 2-0 SH 18 (SUTURE) IMPLANT
SUT VIC AB 3-0 SH 18 (SUTURE) IMPLANT
SUT VIC AB 3-0 SH 27X BRD (SUTURE) ×1 IMPLANT
SUT VICRYL AB 2 0 TIES (SUTURE) IMPLANT
TAG SUTURE CLAMP YLW 5PR (MISCELLANEOUS) ×2 IMPLANT
TIE VASCULAR MAXI BLUE 18IN ST (MISCELLANEOUS) ×1 IMPLANT
TOWEL GREEN STERILE (TOWEL DISPOSABLE) ×1 IMPLANT
TOWEL GREEN STERILE FF (TOWEL DISPOSABLE) ×1 IMPLANT
TRAY FOLEY MTR SLVR 14FR STAT (SET/KITS/TRAYS/PACK) ×1 IMPLANT
TRAY LAPAROSCOPIC MC (CUSTOM PROCEDURE TRAY) ×1 IMPLANT
TROCAR 11X100 Z THREAD (TROCAR) IMPLANT
TROCAR BALLN 12MMX100 BLUNT (TROCAR) IMPLANT
TROCAR Z-THREAD OPTICAL 5X100M (TROCAR) ×1 IMPLANT
TUBE CONNECTING 12X1/4 (SUCTIONS) ×1 IMPLANT
TUBE JEJUNAL 16FR ENFIT (TUBING) ×1 IMPLANT
TUBE NG 5FR 35IN ENFIT (TUBING) IMPLANT
TUBE PU 8FR 16IN ENFIT (TUBING) IMPLANT
TUNNELER SHEATH ON-Q 16GX12 DP (PAIN MANAGEMENT) IMPLANT
VASCULAR TIE MINI RED 18IN STL (MISCELLANEOUS) ×1 IMPLANT
WARMER LAPAROSCOPE (MISCELLANEOUS) ×1 IMPLANT
YANKAUER SUCT BULB TIP NO VENT (SUCTIONS) ×1 IMPLANT

## 2023-07-02 NOTE — Op Note (Signed)
 PREOPERATIVE DIAGNOSIS: adenocarcinoma pancreatic head, cT2N0M0, s/p neoadjuvant FOLFIRINOX  POSTOPERATIVE DIAGNOSIS: Same.   PROCEDURES PERFORMED:  Diagnostic laparoscopy  Classic pancreaticoduodenectomy   Placement of pancreatic duct stent  Jejunostomy feeding tube,    SURGEON: [name], MD   ASSISTANT: Donata Duff, MD   ANESTHESIA: General and epidural   FINDINGS: 2 cm pancreatic head mass. Firm pancreatic tissue. 9 mm common bile duct. 5 mm pancreatic duct.  Prominent chylous channels at LOT.  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 Chronic cholecystitis.  SPECIMENS:   Liver biopsy Peritoneal fluid for cytology Pancreaticoduodenectomy with gallbladder omentum  ESTIMATED BLOOD LOSS: 450 mL.   COMPLICATIONS: None known.   PROCEDURE:   Pt was identified in the holding area and taken to  the operating room, and placed supine on the operating room  table. General anesthesia was induced. The patient's abdomen was  prepped and draped in a sterile fashion, after a Foley catheter was  placed. A time-out was performed according to the surgical safety check  list. When all was correct we continued.   The patient was placed in reverse trendelenburg position and rotated to the right.  The left subcostal margin was anesthetized with local anesthesia.  A 5 mm optiview trocar was placed under direct visualization.  The abdomen was insufflated with carbon dioxide.  The abdomen was examined.  A second port was placed in the upper midline to be able to better visualize the right liver.  Some liver changes from chemo therapy were seen.  There were a few firm areas in the left liver that were appreciated.  There was also a small amount of bilious ascites.  Two superficial liver biopsies were taken with the laparoscopic biopsy forceps and sent for frozen. A suction trap was used to obtain washings from the RUQ.    Frozen sections were negative.    A midline incision was made from the  xiphoid to just below the umbilicus.   The subcutaneous tissues were divided with the Bovie cautery. The peritoneum was entered in the center of the abdomen. Digital retraction was then used to elevate the preperitoneal fat, and  this was taken with the cautery as well. The subcutaneous tissues and  fascia of the muscular layers were taken laterally with the cautery.  Care was taken to protect the underlying viscera.    The Bookwalter self-retaining retractor was placed to assist with visualization. The right colon was taken down off of the white line of Toldt and from the retroperitoneum at the hepatic flexure. The porta was identified.  The portal was evaluated first given that the previous CT had showed increased soft tissue around the celiac and common hepatic arteries that was concerning for malignancy.  This appeared to be just inflammatory change that had thickened the tissues.  The common bile duct was skeletonized.  A vessel loop was passed around it. The gastroduodenal artery, as well as the common hepatic artery were skeletonized. The proper hepatic artery was traced out to make sure that flow was going to both sides of the liver when the GDA was clamped. The GDA was test clamped with the bulldog, with good flow to the liver and no signs of ischemia.   The duodenum was kocherized extensively with blunt dissection and with cautery. The gallbladder was taken off the liver with a combination of blunt dissection and cautery. The cystic duct was clipped with the Hemalock clips. The cystic duct was divided and the gallbladder was passed off.   The  GDA was then divided with 2-0 silk ties and then clipped. The proper hepatic artery was reflected upward, and the anterior portal vein was exposed.  A Kelly clamp was used to explore the tissues underneath the lower surface of the pancreas.  A right angle was used for dissection superiorly.  There was not significant resistance, but there was an area where  it seemed to stick and so additional pressure was not placed in this region.  Attention was then directed to the stomach, and the omentum was taken  off of the stomach at the border of the antrum and the body.  The stomach was divided with the GIA-75 stapler. The border of the stomach was oversewn with a 3-0 running PDS suture.   Attention was then directed to the small bowel. Around 10 cm past the  ligament of Treitz was located, and this was divided with the 75-GIA.  The distal portion of the jejunum was also oversewn with a 3-0 PDS  suture. The fourth portion of the duodenum was skeletonized with the  harmonic scalpel, taking down all of the mesenteric vessels. The  ligament of Treitz was taken down. The IMV was preserved.  The duodenum was then passed underneath the portal vein.  There were a lot of chylous channel seen in this region.  At this point the Tresa Endo was replaced and the pancreas was divided with the cautery. 2-0 silk sutures were tied down and the inferior and superior border of the pancreas. The Bovie was used to coagulate the small bleeders at the border of the pancreas.  The Overholt in combination with the harmonic and locking Weck clips  were then used to take the uncinate process off of the portal vein and  the superior mesenteric artery. Care was taken not to incorporate the  superior mesenteric artery in the dissection. The specimen was then marked and passed off the table for frozen section margin.  This area was quite sticky from the previous chemotherapy.  The jejunum was then passed underneath  the SMV in order to get appropriate lie for the pancreatic and biliary  anastomoses. The more distal portion of the jejunum was pulled up over  the colon, and two 3-0 silks were placed through the posterior border  of the stomach for the gastrojejunostomy. The stomach and the small  bowel were opened, and a GIA-75 was used to create an end-to-end  anastomosis. The open areas  of the staple line were examined to ensure  that there was hemostasis. The defect was then closed with a single  layer of running Connell suture of 3-0 PDS. Prior to a complete  closure, the NG tube was passed toward the afferent limb.   The appropriate location for the choledochojejunostomy was identified, and  the small bowel was opened approximately 12 mm. The anastamosis was created with approximately twelve 4-0 interrupted PDS sutures.   The 2 corner sutures were placed first  and then the posterior layer was done in an interrupted fashion tying on  the inside. The superior layer was then closed with interrupted sutures as  well.   At this point the frozens returned back as  all negative. The pancreatic  anastomosis was then created by opening the jejunum the length  of the pancreatic parenchyma. The pancreas was firm, and the duct was 5 mm. A pediatric feeding tube was used as a pancreatic stent. The posterior layer was formed first with 2-0 silk sutures in interrupted fashion. Five 5-0 PDS  sutures were then used to create a duct-to-mucosa anastamosis.  The anterior layer of the anastomosis was made with interrupted 2-0 silk sutures.  The areas were then irrigated and then those anastomoses were covered  with vistaseal. This was allowed to dry.  The abdomen was then irrigated  again and all the laparotomy sponges were removed. A lap count was  performed, which was correct. Two 19-Blake drains were placed, with the  lateral-most drain placed behind the choledochojejunostomy. The medial  Blake drain was placed just anterior and slightly superior to the  pancreaticojejunostomy.   A jejunostomy tube was placed in the left mid abdomen.  The 16 Fr replacement J tube was used.  It was passed through the abdominal wall. A pursestring suture was placed in the jejunum.  The bowel was opened.  The tube was passed distally.  1.5 mL saline was placed into the balloon.  The pursestring suture was  tied down.  Three Witzel sutures were placed.  The jejunum was then pexed to the abdominal wall with 2-0 silk sutures to minimize risk of volvulus.   The fascia was then closed with #1 looped running PDS sutures. The skin was irrigated and then closed with staples. The wounds were cleaned, dried and dressed with a sterile  dressing.   The patient tolerated the procedure well and was extubated and taken to  PACU in stable condition. Needle and sponge counts were correct x2.

## 2023-07-02 NOTE — Anesthesia Procedure Notes (Signed)
 Procedure Name: Intubation Date/Time: 07/02/2023 9:06 AM  Performed by: Venia Carbon, CRNAPre-anesthesia Checklist: Patient identified, Emergency Drugs available, Suction available and Patient being monitored Patient Re-evaluated:Patient Re-evaluated prior to induction Oxygen Delivery Method: Circle system utilized Preoxygenation: Pre-oxygenation with 100% oxygen Induction Type: IV induction Ventilation: Mask ventilation without difficulty Laryngoscope Size: Mac and 3 Grade View: Grade I Tube type: Oral Tube size: 7.5 mm Number of attempts: 1 Airway Equipment and Method: Stylet and Oral airway Placement Confirmation: ETT inserted through vocal cords under direct vision, positive ETCO2 and breath sounds checked- equal and bilateral Secured at: 23 cm Tube secured with: Tape Dental Injury: Teeth and Oropharynx as per pre-operative assessment

## 2023-07-02 NOTE — Anesthesia Procedure Notes (Signed)
 Epidural Patient location during procedure: pre-op Start time: 07/02/2023 8:15 AM End time: 07/02/2023 8:22 AM  Staffing Anesthesiologist: Achille Rich, MD Performed: anesthesiologist   Preanesthetic Checklist Completed: patient identified, IV checked, site marked, risks and benefits discussed, surgical consent, monitors and equipment checked, pre-op evaluation and timeout performed  Epidural Patient position: sitting Prep: DuraPrep Patient monitoring: heart rate, cardiac monitor, continuous pulse ox and blood pressure Approach: midline Location: thoracic (1-12) (T10) Injection technique: LOR air  Needle:  Needle type: Tuohy  Needle gauge: 17 G Needle length: 9 cm Needle insertion depth: 4 cm Catheter type: closed end flexible Catheter size: 19 Gauge Catheter at skin depth: 10 cm Test dose: negative and 1.5% lidocaine with Epi 1:200 K  Additional Notes Reason for block:at surgeon's request and post-op pain management

## 2023-07-02 NOTE — Anesthesia Postprocedure Evaluation (Signed)
 Anesthesia Post Note  Patient: Randy Allen  Procedure(s) Performed: LAPAROSCOPY, DIAGNOSTIC WHIPPLE PROCEDURE JEJUNOSTOMY TUBE     Patient location during evaluation: PACU Anesthesia Type: Epidural and General Level of consciousness: awake and alert Pain management: pain level controlled Vital Signs Assessment: post-procedure vital signs reviewed and stable Respiratory status: spontaneous breathing, nonlabored ventilation, respiratory function stable and patient connected to nasal cannula oxygen Cardiovascular status: blood pressure returned to baseline and stable Postop Assessment: no apparent nausea or vomiting Anesthetic complications: no   There were no known notable events for this encounter.  Last Vitals:  Vitals:   07/02/23 1745 07/02/23 1800  BP: 90/64 105/73  Pulse: 74 76  Resp: 14 11  Temp:  36.7 C  SpO2: 94% 95%    Last Pain:  Vitals:   07/02/23 1730  PainSc: 6                  Lula Michaux S

## 2023-07-02 NOTE — Progress Notes (Signed)
 Notified Dr. Donell Beers that patients blood pressure is 80s systolic per aline and epidural ropivacaine order had fallen off the MAR. New verbal order for 500 cc bolus of albumin and reordered ropivacaine at 43ml/ hr.

## 2023-07-02 NOTE — Anesthesia Procedure Notes (Signed)
 Arterial Line Insertion Start/End3/19/2025 8:40 AM Performed by: Achille Rich, MD, CRNA  Preanesthetic checklist: patient identified, IV checked, site marked, risks and benefits discussed, surgical consent, monitors and equipment checked, pre-op evaluation, timeout performed and anesthesia consent Lidocaine 1% used for infiltration and patient sedated radial was placed Catheter size: 20 G Hand hygiene performed  and maximum sterile barriers used  Allen's test indicative of satisfactory collateral circulation Attempts: 1 Procedure performed using ultrasound guided technique. Following insertion, dressing applied. Post procedure assessment: normal  Patient tolerated the procedure well with no immediate complications. Additional procedure comments: Aline performed by SRNA.

## 2023-07-02 NOTE — Transfer of Care (Signed)
 Immediate Anesthesia Transfer of Care Note  Patient: Randy Allen  Procedure(s) Performed: LAPAROSCOPY, DIAGNOSTIC WHIPPLE PROCEDURE JEJUNOSTOMY TUBE  Patient Location: PACU  Anesthesia Type:General  Level of Consciousness: awake, alert , drowsy, and patient cooperative  Airway & Oxygen Therapy: Patient Spontanous Breathing and Patient connected to face mask oxygen  Post-op Assessment: Report given to RN and Post -op Vital signs reviewed and stable  Post vital signs: Reviewed and stable  Last Vitals:  Vitals Value Taken Time  BP 100/65 07/02/23 1630  Temp    Pulse 67 07/02/23 1637  Resp 16 07/02/23 1637  SpO2 100 % 07/02/23 1637  Vitals shown include unfiled device data.  Last Pain:  Vitals:   07/02/23 0802  PainSc: 0-No pain         Complications: No notable events documented.

## 2023-07-02 NOTE — Interval H&P Note (Signed)
 History and Physical Interval Note:  07/02/2023 8:54 AM  Randy Allen  has presented today for surgery, with the diagnosis of PANCREATIC CANCER.  The various methods of treatment have been discussed with the patient and family. After consideration of risks, benefits and other options for treatment, the patient has consented to  Procedure(s) with comments: LAPAROSCOPY, DIAGNOSTIC (N/A) - GENERAL AND EPIDURAL 420 WHIPPLE PROCEDURE (N/A) JEJUNOSTOMY TUBE (N/A) as a surgical intervention.  The patient's history has been reviewed, patient examined, no change in status, stable for surgery.  I have reviewed the patient's chart and labs.  Questions were answered to the patient's satisfaction.     [name]

## 2023-07-03 ENCOUNTER — Encounter (HOSPITAL_COMMUNITY): Payer: Self-pay | Admitting: General Surgery

## 2023-07-03 DIAGNOSIS — G8918 Other acute postprocedural pain: Secondary | ICD-10-CM | POA: Diagnosis not present

## 2023-07-03 LAB — CBC
HCT: 20.2 % — ABNORMAL LOW (ref 39.0–52.0)
Hemoglobin: 6.6 g/dL — CL (ref 13.0–17.0)
MCH: 32.8 pg (ref 26.0–34.0)
MCHC: 32.7 g/dL (ref 30.0–36.0)
MCV: 100.5 fL — ABNORMAL HIGH (ref 80.0–100.0)
Platelets: 170 10*3/uL (ref 150–400)
RBC: 2.01 MIL/uL — ABNORMAL LOW (ref 4.22–5.81)
RDW: 24.6 % — ABNORMAL HIGH (ref 11.5–15.5)
WBC: 16.5 10*3/uL — ABNORMAL HIGH (ref 4.0–10.5)
nRBC: 0 % (ref 0.0–0.2)

## 2023-07-03 LAB — POCT I-STAT 7, (LYTES, BLD GAS, ICA,H+H)
Acid-base deficit: 2 mmol/L (ref 0.0–2.0)
Bicarbonate: 23.2 mmol/L (ref 20.0–28.0)
Calcium, Ion: 1.13 mmol/L — ABNORMAL LOW (ref 1.15–1.40)
HCT: 25 % — ABNORMAL LOW (ref 39.0–52.0)
Hemoglobin: 8.5 g/dL — ABNORMAL LOW (ref 13.0–17.0)
O2 Saturation: 100 %
Potassium: 4 mmol/L (ref 3.5–5.1)
Sodium: 138 mmol/L (ref 135–145)
TCO2: 24 mmol/L (ref 22–32)
pCO2 arterial: 40.3 mmHg (ref 32–48)
pH, Arterial: 7.367 (ref 7.35–7.45)
pO2, Arterial: 262 mmHg — ABNORMAL HIGH (ref 83–108)

## 2023-07-03 LAB — COMPREHENSIVE METABOLIC PANEL
ALT: 50 U/L — ABNORMAL HIGH (ref 0–44)
AST: 86 U/L — ABNORMAL HIGH (ref 15–41)
Albumin: 2.5 g/dL — ABNORMAL LOW (ref 3.5–5.0)
Alkaline Phosphatase: 236 U/L — ABNORMAL HIGH (ref 38–126)
Anion gap: 5 (ref 5–15)
BUN: 18 mg/dL (ref 8–23)
CO2: 21 mmol/L — ABNORMAL LOW (ref 22–32)
Calcium: 7.7 mg/dL — ABNORMAL LOW (ref 8.9–10.3)
Chloride: 106 mmol/L (ref 98–111)
Creatinine, Ser: 0.89 mg/dL (ref 0.61–1.24)
GFR, Estimated: >60 mL/min (ref >60)
Glucose, Bld: 155 mg/dL — ABNORMAL HIGH (ref 70–99)
Potassium: 4.2 mmol/L (ref 3.5–5.1)
Sodium: 132 mmol/L — ABNORMAL LOW (ref 135–145)
Total Bilirubin: 0.7 mg/dL (ref 0.0–1.2)
Total Protein: 4.4 g/dL — ABNORMAL LOW (ref 6.5–8.1)

## 2023-07-03 LAB — PREPARE RBC (CROSSMATCH)

## 2023-07-03 LAB — GLUCOSE, CAPILLARY
Glucose-Capillary: 190 mg/dL — ABNORMAL HIGH (ref 70–99)
Glucose-Capillary: 148 mg/dL — ABNORMAL HIGH (ref 70–99)
Glucose-Capillary: 123 mg/dL — ABNORMAL HIGH (ref 70–99)
Glucose-Capillary: 101 mg/dL — ABNORMAL HIGH (ref 70–99)

## 2023-07-03 LAB — PROTIME-INR
INR: 1.3 — ABNORMAL HIGH (ref 0.8–1.2)
Prothrombin Time: 16.7 s — ABNORMAL HIGH (ref 11.4–15.2)

## 2023-07-03 LAB — MAGNESIUM: Magnesium: 1.5 mg/dL — ABNORMAL LOW (ref 1.7–2.4)

## 2023-07-03 LAB — HEMOGLOBIN A1C
Hgb A1c MFr Bld: 5.1 % (ref 4.8–5.6)
Mean Plasma Glucose: 99.67 mg/dL

## 2023-07-03 LAB — HEMOGLOBIN AND HEMATOCRIT, BLOOD
HCT: 21.3 % — ABNORMAL LOW (ref 39.0–52.0)
Hemoglobin: 7 g/dL — ABNORMAL LOW (ref 13.0–17.0)

## 2023-07-03 LAB — PHOSPHORUS: Phosphorus: 4.5 mg/dL (ref 2.5–4.6)

## 2023-07-03 MED ORDER — CHLORPROMAZINE HCL 25 MG/ML IJ SOLN
25.0000 mg | Freq: Three times a day (TID) | INTRAMUSCULAR | Status: DC | PRN
  Administered 2023-07-03: 25 mg via INTRAMUSCULAR
  Filled 2023-07-03 (×2): qty 1

## 2023-07-03 MED ORDER — OSMOLITE 1.5 CAL PO LIQD
1000.0000 mL | ORAL | Status: DC
  Administered 2023-07-04 – 2023-07-06 (×2): 1000 mL

## 2023-07-03 MED ORDER — SODIUM CHLORIDE 0.9% IV SOLUTION: Freq: Once | INTRAVENOUS | Status: AC

## 2023-07-03 MED ORDER — INSULIN ASPART 100 UNIT/ML IJ SOLN
0.0000 [IU] | Freq: Three times a day (TID) | INTRAMUSCULAR | Status: DC
  Administered 2023-07-03: 1 [IU] via SUBCUTANEOUS
  Administered 2023-07-03: 2 [IU] via SUBCUTANEOUS
  Administered 2023-07-06 (×3): 1 [IU] via SUBCUTANEOUS
  Administered 2023-07-07 (×2): 2 [IU] via SUBCUTANEOUS

## 2023-07-03 MED ORDER — ALBUMIN HUMAN 25 % IV SOLN
25.0000 g | Freq: Four times a day (QID) | INTRAVENOUS | Status: AC
  Administered 2023-07-03 – 2023-07-04 (×4): 25 g via INTRAVENOUS
  Administered 2023-07-04: 12.5 g via INTRAVENOUS
  Administered 2023-07-04 (×2): 25 g via INTRAVENOUS
  Administered 2023-07-04: 12.5 g via INTRAVENOUS
  Filled 2023-07-03 (×7): qty 100

## 2023-07-03 MED ORDER — MAGNESIUM SULFATE 2 GM/50ML IV SOLN
2.0000 g | Freq: Once | INTRAVENOUS | Status: AC
  Administered 2023-07-03: 2 g via INTRAVENOUS
  Filled 2023-07-03: qty 50

## 2023-07-03 NOTE — Progress Notes (Addendum)
 0715 patient alert x4 able to make all needs known on room air waiting on labs at this time to result patient on epidural with PCA pump IV fluids with potassium running right sided 2 JP drains unremarkable. J tube also unremarkable stables to abd intact. NG to LIS 0900 blood started for patient HBG 6.6, magnesium replaced albumin also given. 1215 blood complete pt tolerated well 1230 refused bath at this time  1400 patient up to chair with therapy 1600 CHG and bath completed patient back to bed after

## 2023-07-03 NOTE — Progress Notes (Signed)
 1 Day Post-Op   Subjective/Chief Complaint: Had some hypotension in evening.  Holding epidural was temporizing measure.  Got albumin bolus and restarted epidural at lower rate which was effective.  Pain well controlled.     Objective: Vital signs in last 24 hours: Temp:  [98 F (36.7 C)-98.8 F (37.1 C)] 98.2 F (36.8 C) (03/20 0800) Pulse Rate:  [70-91] 81 (03/20 0800) Resp:  [11-22] 14 (03/20 0800) BP: (75-113)/(56-79) 93/64 (03/20 0800) SpO2:  [93 %-100 %] 97 % (03/20 0800) Arterial Line BP: (84-148)/(44-70) 119/60 (03/20 0800) Weight:  [65.7 kg] 65.7 kg (03/20 0500)    Intake/Output from previous day: 03/19 0701 - 03/20 0700 In: 4780.2 [I.V.:3532.7; Blood:315; IV Piggyback:800.3] Out: 2780 [Urine:1900; Drains:430; Blood:450] Intake/Output this shift: Total I/O In: 105 [I.V.:100; Other:5] Out: 40 [Drains:40]  Gen:  Alert and oriented Resp:  breathing comfortably CV:  RR&R Abd:  soft, non distended, approp tender.  J tube in place Drains right abdomen serosang Ext:  warm, well perfused, SCDs in place.    Lab Results:  Recent Labs    07/02/23 1631 07/03/23 0519  WBC 13.2* 16.5*  HGB 8.2* 6.6*  HCT 25.8* 20.2*  PLT 132* 170   BMET Recent Labs    07/02/23 1631 07/03/23 0519  NA 136 132*  K 4.0 4.2  CL 106 106  CO2 21* 21*  GLUCOSE 166* 155*  BUN 11 18  CREATININE 0.73 0.89  CALCIUM 8.0* 7.7*   PT/INR Recent Labs    07/03/23 0519  LABPROT 16.7*  INR 1.3*   ABG Recent Labs    07/02/23 0930 07/02/23 1204  PHART 7.371 7.375  HCO3 24.3 23.1    Studies/Results: No results found.  Anti-infectives: Anti-infectives (From admission, onward)    Start     Dose/Rate Route Frequency Ordered Stop   07/02/23 2200  ceFAZolin (ANCEF) IVPB 2g/100 mL premix        2 g 200 mL/hr over 30 Minutes Intravenous Every 8 hours 07/02/23 1814 07/02/23 2240   07/02/23 0730  ceFAZolin (ANCEF) IVPB 2g/100 mL premix        2 g 200 mL/hr over 30 Minutes Intravenous  On call to O.R. 07/02/23 0719 07/02/23 1322       Assessment/Plan: s/p Procedure(s) with comments: LAPAROSCOPY, DIAGNOSTIC (N/A) - GENERAL AND EPIDURAL 420 WHIPPLE PROCEDURE (N/A) JEJUNOSTOMY TUBE (N/A) 07/02/23 [place]  POD 1  Adenocarcinoma head of pancreas, cT2N0M0, s/p neoadjuvant chemotherapy S/p whipple; Await pathology  Acute blood loss anemia on top of chronic anemia from chemotherapy - transfuse 1 u prbcs.  Got 1 unit intra-op for pre op HCT 24 although EBL only 450.   Hypotension - resolved with decreased epidural and albumin bolus.  Holding home antihypertensives.  - add 25% albumin boluses    Pain control - epidural and PCA.  IV tylenol. Iv robaxin.  - once taking PO, will start oral tylenol and gabapentin.   Hyperglycemia  - add SSI. Once tube feeds started we might need to add some long acting insulin, but will see how cbgs are prior to that.   FEN - plan d/c ngt tomorrow unless significant nausea and start sips as well as trickle feeds in AM. - protonix for GI ppx - continue IV fluids while NPO.   - continue foley for urinary output monitoring.   Hypomagnesemia- replete  VTE ppx- TEDS, sq heparin  Dispo:  therapies, stay in ICU today   LOS: 1 day    [name] 07/03/2023

## 2023-07-03 NOTE — Progress Notes (Signed)
 Initial Nutrition Assessment  DOCUMENTATION CODES:   Severe malnutrition in context of chronic illness  INTERVENTION:   Initiate tube feeding via J-tube: Osmolite 1.5 at 20 ml/h  As able recommend increase by 10 ml every 8 hours to goal rate of 55 ml/hr (1320 ml per day) Prosource TF20 60 ml BID  Provides 2140 kcal, 122 gm protein, 1003 ml free water daily  Recommend MVI with minerals daily once able to take orals Recommend 100 mg thiamine (IV) daily x 7 days Recommend monitor magnesium and phosphorus x 4 days, MD to replete as needed, as pt is at risk for refeeding syndrome given meets criteria for severe malnutrition.  Notify [facility] RD of admission   Discussed nutrition post Whipple with pt, discussed use of j-tube for nutrition  NUTRITION DIAGNOSIS:   Severe Malnutrition related to chronic illness (cancer) as evidenced by moderate muscle depletion, moderate fat depletion, energy intake < or equal to 75% for > or equal to 1 month, percent weight loss.  GOAL:   Patient will meet greater than or equal to 90% of their needs  MONITOR:   TF tolerance  REASON FOR ASSESSMENT:   Consult Enteral/tube feeding initiation and management  ASSESSMENT:   Pt with PMH of HTN, bicsupid aortic valve, Vitamin D deficiency, and dx with pancreatic cancer 12/2022 and received 8 cycles of FOLFIRINOX prior to surgery. Pt now admitted for Whipple.  Pt discussed during ICU rounds and with RN. Spoke with pt who reports that his usual weight was 170 lb as previously noted and he feels that his weight PTA had gotten to 135 lb, which would be a 20.5% weight loss x 1 year.  He reports fair appetite. 24 hr recall Breakfast is a packet of oatmeal Lunch is either skipped or he eats a frozen TV dinner of fish sticks Dinner is either cooked (chicken) or another frozen dinner.  He drinks 1 chocolate Atkins shake per day and this is the only shake he likes.  He does not think he would be able to drink  more than one shake per day.   After his most recent chemo he reports that food he eats feels that it just gets bigger in his mouth as he chews.  Discussed importance of nutrition, reviewed diet/nutrition progression after Whipple. Discussed j-tube and how it would be used for nutrition.    3/19 s/p Whipple, J-tube placement, and liver biopsy  Per [facility] RD notes pt's usual weight is 170 lb 05/2022, pt weighed 144 lb at last RD visit 12/24. Current weight is 144 lb.    Medications reviewed and include: 0-9 units novolog SSI TID, PCA pump morphine every 4 hours, protonix D5 1/2 NS with 20 mEq CKl/L @ 100 ml/hr  2 g mag sulfate x 1  Received zofran and compazine this am.   Labs reviewed:  Na 132 Phos 4.5 Mag 1.5 Hgb 6.6   16 F J-tube  NG tube in place after surgery   NUTRITION - FOCUSED PHYSICAL EXAM:  Flowsheet Row Most Recent Value  Orbital Region Moderate depletion  Upper Arm Region Moderate depletion  Thoracic and Lumbar Region Severe depletion  Buccal Region Moderate depletion  Temple Region Moderate depletion  Clavicle Bone Region Moderate depletion  Clavicle and Acromion Bone Region Severe depletion  Scapular Bone Region Moderate depletion  Dorsal Hand Mild depletion  Patellar Region Moderate depletion  Anterior Thigh Region Moderate depletion  Posterior Calf Region Moderate depletion  Edema (RD Assessment) None  Hair Reviewed  Eyes Reviewed  [pale conjuntiva]  Mouth Reviewed  Skin Reviewed  Nails Reviewed  [pale nail beds]       Diet Order:   Diet Order             Diet NPO time specified  Diet effective now                   EDUCATION NEEDS:   Education needs have been addressed  Skin:  Skin Assessment: Reviewed RN Assessment (abd incision)  Last BM:  unknown  Height:   Ht Readings from Last 1 Encounters:  07/02/23 5\' 8"  (1.727 m)    Weight:   Wt Readings from Last 1 Encounters:  07/03/23 65.7 kg    Ideal Body Weight:  70  kg  BMI:  Body mass index is 22.02 kg/m.  Estimated Nutritional Needs:   Kcal:  2000-2200  Protein:  100-120 grams  Fluid:  >2 L/day  Cammy Copa., RD, LDN, CNSC See AMiON for contact information

## 2023-07-03 NOTE — Plan of Care (Signed)
  Problem: Education: Goal: Knowledge of General Education information will improve Description: Including pain rating scale, medication(s)/side effects and non-pharmacologic comfort measures Outcome: Progressing   Problem: Health Behavior/Discharge Planning: Goal: Ability to manage health-related needs will improve Outcome: Progressing   Problem: Clinical Measurements: Goal: Ability to maintain clinical measurements within normal limits will improve Outcome: Progressing Goal: Will remain free from infection Outcome: Progressing Goal: Diagnostic test results will improve Outcome: Progressing Goal: Respiratory complications will improve Outcome: Progressing Goal: Cardiovascular complication will be avoided Outcome: Progressing   Problem: Activity: Goal: Risk for activity intolerance will decrease Outcome: Progressing   Problem: Nutrition: Goal: Adequate nutrition will be maintained Outcome: Progressing   Problem: Coping: Goal: Level of anxiety will decrease Outcome: Progressing   Problem: Elimination: Goal: Will not experience complications related to bowel motility Outcome: Progressing Goal: Will not experience complications related to urinary retention Outcome: Progressing   Problem: Pain Managment: Goal: General experience of comfort will improve and/or be controlled Outcome: Not Progressing Note: Epidural and PCA in place    Problem: Safety: Goal: Ability to remain free from injury will improve Outcome: Progressing   Problem: Skin Integrity: Goal: Risk for impaired skin integrity will decrease Outcome: Progressing   Problem: Education: Goal: Ability to describe self-care measures that may prevent or decrease complications (Diabetes Survival Skills Education) will improve Outcome: Progressing Goal: Individualized Educational Video(s) Outcome: Progressing   Problem: Coping: Goal: Ability to adjust to condition or change in health will improve Outcome:  Progressing   Problem: Fluid Volume: Goal: Ability to maintain a balanced intake and output will improve Outcome: Progressing   Problem: Health Behavior/Discharge Planning: Goal: Ability to identify and utilize available resources and services will improve Outcome: Progressing Goal: Ability to manage health-related needs will improve Outcome: Progressing   Problem: Metabolic: Goal: Ability to maintain appropriate glucose levels will improve Outcome: Progressing   Problem: Nutritional: Goal: Maintenance of adequate nutrition will improve Outcome: Not Progressing Note: Plan is for tube feeding to start tomorrow Goal: Progress toward achieving an optimal weight will improve Outcome: Not Progressing   Problem: Skin Integrity: Goal: Risk for impaired skin integrity will decrease Outcome: Progressing   Problem: Tissue Perfusion: Goal: Adequacy of tissue perfusion will improve Outcome: Progressing

## 2023-07-03 NOTE — Addendum Note (Signed)
 Addendum  created 07/03/23 1312 by Achille Rich, MD   Clinical Note Signed, Intraprocedure Blocks edited, SmartForm saved

## 2023-07-03 NOTE — Anesthesia Post-op Follow-up Note (Signed)
  Anesthesia Pain Follow-up Note  Patient: Randy Allen  Day #: 4  Date of Follow-up: 07/03/2023 Time: 3:59 PM  Last Vitals:  Vitals:   07/03/23 1539 07/03/23 1558  BP:    Pulse: 83   Resp: 15   Temp:  36.7 C  SpO2: 98%     Level of Consciousness: alert  Pain: moderate   Side Effects:None  Catheter Site Exam:clean, dry  Anti-Coag Meds (From admission, onward)    Start     Dose/Rate Route Frequency Ordered Stop   07/02/23 2200  heparin injection 5,000 Units       Note to Pharmacy: I HAVE DISCUSSED WITH ANESTHESIA AND THEY ARE OK WITH EPIDURAL AND HEPARIN VTE PPX TOGETHER.   5,000 Units Subcutaneous Every 8 hours 07/02/23 1814        Epidural / Intrathecal (From admission, onward)    Start     Dose/Rate Route Frequency Ordered Stop   07/02/23 2245  ropivacaine (PF) 2 mg/mL (0.2%) (NAROPIN) injection        8 mL/hr 8 mL/hr  Epidural Continuous 07/02/23 2158          Plan: Continue current therapy of postop epidural at surgeon's request  [place]

## 2023-07-03 NOTE — Plan of Care (Signed)

## 2023-07-03 NOTE — Evaluation (Signed)
 Occupational Therapy Evaluation Patient Details Name: Randy Allen MRN: [id number] DOB: [birth year] Today's Date: 07/03/2023   History of Present Illness   63 y.o. male adm 3/19 for pancreatic cancer follow-up.  Patient had been having pain in the abdomen for several months and weight loss, MRI showed a pancreatic head mass.  S/p Whipple procedure.  PMH includes:Aneurysm,  Chronic kidney disease, History of cancer, Hyperlipidemia, Hypertension, Thyroid disease.     Clinical Impressions Patient admitted for the diagnosis above.  PTA he lives at home with his spouse, works from home and remains independent with all aspects of ADL,iADL and mobility.  Patient expresses no discomfort, and deficits surrounds all his lines and leeds.  Patient is up and moving with supervision due to lines.  OT will follow to ensure independence post lines, and no post acute OT is anticipated.       If plan is discharge home, recommend the following:   Assist for transportation     Functional Status Assessment   Patient has had a recent decline in their functional status and demonstrates the ability to make significant improvements in function in a reasonable and predictable amount of time.     Equipment Recommendations   None recommended by OT     Recommendations for Other Services         Precautions/Restrictions   Precautions Precautions: Fall Recall of Precautions/Restrictions: Intact Precaution/Restrictions Comments: NG tube on suction, 2 JP Drains, A-line R UE, PCA, Foley, epidural access. Restrictions Weight Bearing Restrictions Per Provider Order: No     Mobility Bed Mobility Overal bed mobility: Needs Assistance Bed Mobility: Sidelying to Sit   Sidelying to sit: Supervision, HOB elevated            Transfers Overall transfer level: Needs assistance Equipment used: None Transfers: Sit to/from Stand, Bed to chair/wheelchair/BSC Sit to Stand: Supervision     Step  pivot transfers: Supervision            Balance Overall balance assessment: Needs assistance Sitting-balance support: Feet supported Sitting balance-Leahy Scale: Normal     Standing balance support: No upper extremity supported Standing balance-Leahy Scale: Good                             ADL either performed or assessed with clinical judgement   ADL       Grooming: Supervision/safety               Lower Body Dressing: Supervision/safety   Toilet Transfer: Supervision/safety                   Vision Patient Visual Report: No change from baseline       Perception Perception: Not tested       Praxis Praxis: Not tested       Pertinent Vitals/Pain Pain Assessment Pain Assessment: Faces Faces Pain Scale: No hurt Pain Intervention(s): Monitored during session     Extremity/Trunk Assessment Upper Extremity Assessment Upper Extremity Assessment: Overall WFL for tasks assessed   Lower Extremity Assessment Lower Extremity Assessment: Defer to PT evaluation   Cervical / Trunk Assessment Cervical / Trunk Assessment: Normal   Communication Communication Communication: No apparent difficulties   Cognition Arousal: Alert Behavior During Therapy: WFL for tasks assessed/performed Cognition: No apparent impairments  Following commands: Intact       Cueing  General Comments          Exercises     Shoulder Instructions      Home Living Family/patient expects to be discharged to:: Private residence Living Arrangements: Spouse/significant other Available Help at Discharge: Family;Available 24 hours/day Type of Home: House Home Access: Level entry     Home Layout: Two level;Able to live on main level with bedroom/bathroom Alternate Level Stairs-Number of Steps: flight   Bathroom Shower/Tub: Producer, television/film/video: Standard Bathroom Accessibility: Yes How Accessible:  Accessible via walker Home Equipment: Rolling Walker (2 wheels);BSC/3in1;Toilet riser;Shower seat;Hand held shower head          Prior Functioning/Environment Prior Level of Function : Independent/Modified Independent;Working/employed;Driving                    OT Problem List: Decreased activity tolerance   OT Treatment/Interventions: Self-care/ADL training;Therapeutic activities;Patient/family education;Balance training      OT Goals(Current goals can be found in the care plan section)   Acute Rehab OT Goals Patient Stated Goal: Return home OT Goal Formulation: With patient Time For Goal Achievement: 07/17/23 Potential to Achieve Goals: Good ADL Goals Pt Will Perform Grooming: Independently Pt Will Perform Lower Body Dressing: Independently Pt Will Transfer to Toilet: Independently   OT Frequency:  Min 1X/week    Co-evaluation              AM-PAC OT "6 Clicks" Daily Activity     Outcome Measure Help from another person eating meals?: Total Help from another person taking care of personal grooming?: A Little Help from another person toileting, which includes using toliet, bedpan, or urinal?: A Little Help from another person bathing (including washing, rinsing, drying)?: A Little Help from another person to put on and taking off regular upper body clothing?: A Little Help from another person to put on and taking off regular lower body clothing?: A Little 6 Click Score: 16   End of Session Nurse Communication: Mobility status  Activity Tolerance: Patient tolerated treatment well Patient left: in chair;with call bell/phone within reach  OT Visit Diagnosis: Unsteadiness on feet (R26.81)                Time: 2130-8657 OT Time Calculation (min): 24 min Charges:  OT General Charges $OT Visit: 1 Visit OT Evaluation $OT Eval Moderate Complexity: 1 Mod  07/03/2023  RP, OTR/L  Acute Rehabilitation Services  Office:  [phone]   Suzanna Obey 07/03/2023, 2:41 PM

## 2023-07-03 NOTE — Addendum Note (Signed)
 Addendum  created 07/03/23 1600 by [place] Nation, MD   Clinical Note Signed

## 2023-07-03 NOTE — TOC Initial Note (Signed)
 Transition of Care [facility]) - Initial/Assessment Note    Patient Details  Name: Randy Allen MRN: [id number] Date of Birth: May 28, 1960  [facility]) CM/SW Contact:    Lamonte Sakai, Student-Social Work Phone Number: 07/03/2023, 9:11 AM  Clinical Narrative:                  Pt admitted from home with spouse. No current TOC needs, please consult as needs arise.       Patient Goals and CMS Choice            Expected Discharge Plan and Services       Living arrangements for the past 2 months: Single Family Home                                      Prior Living Arrangements/Services Living arrangements for the past 2 months: Single Family Home Lives with:: Spouse                   Activities of Daily Living      Permission Sought/Granted                  Emotional Assessment       Orientation: : Oriented to Self, Oriented to Place, Oriented to  Time, Oriented to Situation      Admission diagnosis:  H/O Whipple procedure [Z90.410, Z90.49] Adenocarcinoma of head of pancreas (HCC) [C25.0] Patient Active Problem List   Diagnosis Date Noted   H/O Whipple procedure 07/02/2023   Adenocarcinoma of head of pancreas (HCC) 07/02/2023   Genetic testing 03/12/2023   Pancreatic adenocarcinoma (HCC) 02/06/2023   Gastric ulcer without hemorrhage or perforation 02/06/2023   Cancer of head of pancreas (HCC) 01/27/2023   Pancreatic mass 01/17/2023   Mass of head of pancreas 01/16/2023   Common bile duct obstruction 01/16/2023   Jaundice 01/16/2023   HTN (hypertension) 01/16/2023   Common bile duct (CBD) obstruction 01/16/2023   CKD (chronic kidney disease) stage 2, GFR 60-89 ml/min 10/07/2019   Ascending aortic aneurysm (HCC) 10/06/2019   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   Family history of ischemic heart disease 05/20/2017   BMI 24.0-24.9, adult 03/08/2015   Medication management 08/16/2013   Essential hypertension  08/16/2013   Hyperlipidemia 08/16/2013   Abnormal glucose 08/16/2013   Bicuspid aortic valve with ascending aorta 4.0 to 4.5 cm in diameter 02/22/2013   Irritable bowel syndrome 02/22/2013   Hypothyroidism 02/22/2013   Vitamin D deficiency 02/22/2013   PCP:  Lucky Cowboy, MD Pharmacy:   [facility] [id number] - [place], [place] - [address] [place] [address] Phone: [phone] Fax: [phone]     Social Drivers of Health (SDOH) Social History: SDOH Screenings   Food Insecurity: No Food Insecurity (07/03/2023)  Housing: Low Risk  (07/03/2023)  Transportation Needs: No Transportation Needs (07/03/2023)  Utilities: Not At Risk (07/03/2023)  Depression (PHQ2-9): Low Risk  (01/27/2023)  Tobacco Use: Low Risk  (06/24/2023)  Recent Concern: Tobacco Use - Medium Risk (06/03/2023)   Received from [facility]   SDOH Interventions:     Readmission Risk Interventions     No data to display

## 2023-07-03 NOTE — Evaluation (Signed)
 Physical Therapy Evaluation Patient Details Name: Randy Allen MRN: [id number] DOB: 07-Dec-1960 Today's Date: 07/03/2023  History of Present Illness  63 y.o. male adm 3/19 for pancreatic cancer follow-up.  Patient had been having pain in the abdomen for several months and weight loss, MRI showed a pancreatic head mass.  S/p Whipple procedure.  PMH includes:Aneurysm,  Chronic kidney disease, History of cancer, Hyperlipidemia, Hypertension, Thyroid disease.   Clinical Impression  Pt in bed upon arrival of PT, agreeable to evaluation at this time. Prior to admission the pt was completely independent, living with his wife, and working from home (in an upstairs office). The pt was able to complete bed mobility and initial transfers with cues for abdominal precautions and assist for line management only. He demos good balance and strength in LE to complete transfers without physical assistance. Pt will continue to benefit from skilled PT acutely to progress ambulation distance and complete stair training prior to anticipated d/c home once medically stable. Anticipate no PT follow up needs.        If plan is discharge home, recommend the following: A little help with walking and/or transfers;Assistance with cooking/housework;Help with stairs or ramp for entrance;Assist for transportation   Can travel by private vehicle        Equipment Recommendations None recommended by PT  Recommendations for Other Services       Functional Status Assessment Patient has had a recent decline in their functional status and demonstrates the ability to make significant improvements in function in a reasonable and predictable amount of time.     Precautions / Restrictions Precautions Precautions: Fall Recall of Precautions/Restrictions: Intact Precaution/Restrictions Comments: NG tube on suction, 2 JP Drains, A-line R UE, PCA, Foley, epidural access. Restrictions Weight Bearing Restrictions Per Provider Order:  No      Mobility  Bed Mobility Overal bed mobility: Needs Assistance Bed Mobility: Rolling, Sidelying to Sit Rolling: Supervision Sidelying to sit: Supervision, HOB elevated       General bed mobility comments: assist for lines, pt with slow but safe movements. cued for abdominal precautions    Transfers Overall transfer level: Needs assistance Equipment used: None Transfers: Sit to/from Stand, Bed to chair/wheelchair/BSC Sit to Stand: Supervision   Step pivot transfers: Supervision       General transfer comment: assist for line management only    Ambulation/Gait Ambulation/Gait assistance: Supervision Gait Distance (Feet): 3 Feet Assistive device: None Gait Pattern/deviations: Step-to pattern Gait velocity: appropriately slow for line management     General Gait Details: limited by lines and NG tube, no UE support and pt largely stable     Balance Overall balance assessment: Needs assistance Sitting-balance support: Feet supported Sitting balance-Leahy Scale: Normal     Standing balance support: No upper extremity supported Standing balance-Leahy Scale: Good Standing balance comment: with standing and small steps                             Pertinent Vitals/Pain Pain Assessment Pain Assessment: 0-10 Pain Score: 3  Pain Intervention(s): Limited activity within patient's tolerance, Monitored during session, Repositioned    Home Living Family/patient expects to be discharged to:: Private residence Living Arrangements: Spouse/significant other Available Help at Discharge: Family;Available 24 hours/day Type of Home: House Home Access: Level entry     Alternate Level Stairs-Number of Steps: flight Home Layout: Two level;Able to live on main level with bedroom/bathroom Home Equipment: Rolling Walker (2 wheels);BSC/3in1;Toilet riser;Shower seat;Hand  held shower head      Prior Function Prior Level of Function : Independent/Modified  Independent;Working/employed;Driving             Mobility Comments: pt works from home, no issues with mobility ADLs Comments: independent, working from home     Extremity/Trunk Assessment   Upper Extremity Assessment Upper Extremity Assessment: Defer to OT evaluation    Lower Extremity Assessment Lower Extremity Assessment: Overall WFL for tasks assessed    Cervical / Trunk Assessment Cervical / Trunk Assessment: Other exceptions Cervical / Trunk Exceptions: abdominal surgery, x2 JP drains and epidural present  Communication   Communication Communication: No apparent difficulties    Cognition Arousal: Alert Behavior During Therapy: WFL for tasks assessed/performed   PT - Cognitive impairments: No apparent impairments                         Following commands: Intact       Cueing Cueing Techniques: Verbal cues     General Comments General comments (skin integrity, edema, etc.): VSS on RA    Exercises     Assessment/Plan    PT Assessment Patient needs continued PT services  PT Problem List Decreased strength;Decreased range of motion;Decreased balance;Decreased activity tolerance;Decreased mobility       PT Treatment Interventions DME instruction;Gait training;Stair training;Functional mobility training;Therapeutic activities;Balance training;Therapeutic exercise;Patient/family education    PT Goals (Current goals can be found in the Care Plan section)  Acute Rehab PT Goals Patient Stated Goal: return home and to independence PT Goal Formulation: With patient Time For Goal Achievement: 07/17/23 Potential to Achieve Goals: Good    Frequency Min 2X/week     Co-evaluation   Reason for Co-Treatment: Complexity of the patient's impairments (multi-system involvement)   OT goals addressed during session: ADL's and self-care       AM-PAC PT "6 Clicks" Mobility  Outcome Measure Help needed turning from your back to your side while in a  flat bed without using bedrails?: A Little Help needed moving from lying on your back to sitting on the side of a flat bed without using bedrails?: A Little Help needed moving to and from a bed to a chair (including a wheelchair)?: A Little Help needed standing up from a chair using your arms (e.g., wheelchair or bedside chair)?: A Little Help needed to walk in hospital room?: Total (<20 ft) Help needed climbing 3-5 steps with a railing? : Total 6 Click Score: 14    End of Session   Activity Tolerance: Patient tolerated treatment well Patient left: in chair;with call bell/phone within reach Nurse Communication: Mobility status PT Visit Diagnosis: Unsteadiness on feet (R26.81);Other abnormalities of gait and mobility (R26.89);Muscle weakness (generalized) (M62.81);Pain Pain - part of body:  (abdomen)    Time: 4098-1191 PT Time Calculation (min) (ACUTE ONLY): 23 min   Charges:   PT Evaluation $PT Eval Moderate Complexity: 1 Mod   PT General Charges $$ ACUTE PT VISIT: 1 Visit         Vickki Muff, PT, DPT   Acute Rehabilitation Department Office [phone] Secure Chat Communication Preferred  Ronnie Derby 07/03/2023, 3:56 PM

## 2023-07-04 DIAGNOSIS — E43 Unspecified severe protein-calorie malnutrition: Secondary | ICD-10-CM | POA: Diagnosis present

## 2023-07-04 LAB — CBC
HCT: 18.1 % — ABNORMAL LOW (ref 39.0–52.0)
Hemoglobin: 6 g/dL — CL (ref 13.0–17.0)
MCH: 33.3 pg (ref 26.0–34.0)
MCHC: 33.1 g/dL (ref 30.0–36.0)
MCV: 100.6 fL — ABNORMAL HIGH (ref 80.0–100.0)
Platelets: 103 10*3/uL — ABNORMAL LOW (ref 150–400)
RBC: 1.8 MIL/uL — ABNORMAL LOW (ref 4.22–5.81)
RDW: 21.9 % — ABNORMAL HIGH (ref 11.5–15.5)
WBC: 19.3 10*3/uL — ABNORMAL HIGH (ref 4.0–10.5)
nRBC: 0.1 % (ref 0.0–0.2)

## 2023-07-04 LAB — COMPREHENSIVE METABOLIC PANEL
ALT: 27 U/L (ref 0–44)
AST: 45 U/L — ABNORMAL HIGH (ref 15–41)
Albumin: 3.2 g/dL — ABNORMAL LOW (ref 3.5–5.0)
Alkaline Phosphatase: 145 U/L — ABNORMAL HIGH (ref 38–126)
Anion gap: 6 (ref 5–15)
BUN: 17 mg/dL (ref 8–23)
CO2: 23 mmol/L (ref 22–32)
Calcium: 7.9 mg/dL — ABNORMAL LOW (ref 8.9–10.3)
Chloride: 104 mmol/L (ref 98–111)
Creatinine, Ser: 0.71 mg/dL (ref 0.61–1.24)
GFR, Estimated: >60 mL/min (ref >60)
Glucose, Bld: 114 mg/dL — ABNORMAL HIGH (ref 70–99)
Potassium: 3.9 mmol/L (ref 3.5–5.1)
Sodium: 133 mmol/L — ABNORMAL LOW (ref 135–145)
Total Bilirubin: 0.6 mg/dL (ref 0.0–1.2)
Total Protein: 4.8 g/dL — ABNORMAL LOW (ref 6.5–8.1)

## 2023-07-04 LAB — HEMOGLOBIN AND HEMATOCRIT, BLOOD
HCT: 23.4 % — ABNORMAL LOW (ref 39.0–52.0)
Hemoglobin: 8 g/dL — ABNORMAL LOW (ref 13.0–17.0)

## 2023-07-04 LAB — MAGNESIUM: Magnesium: 2.2 mg/dL (ref 1.7–2.4)

## 2023-07-04 LAB — PHOSPHORUS: Phosphorus: 2.1 mg/dL — ABNORMAL LOW (ref 2.5–4.6)

## 2023-07-04 LAB — PROTIME-INR
INR: 1.4 — ABNORMAL HIGH (ref 0.8–1.2)
Prothrombin Time: 17.2 s — ABNORMAL HIGH (ref 11.4–15.2)

## 2023-07-04 LAB — GLUCOSE, CAPILLARY
Glucose-Capillary: 108 mg/dL — ABNORMAL HIGH (ref 70–99)
Glucose-Capillary: 109 mg/dL — ABNORMAL HIGH (ref 70–99)
Glucose-Capillary: 113 mg/dL — ABNORMAL HIGH (ref 70–99)
Glucose-Capillary: 116 mg/dL — ABNORMAL HIGH (ref 70–99)
Glucose-Capillary: 91 mg/dL (ref 70–99)
Glucose-Capillary: 96 mg/dL (ref 70–99)

## 2023-07-04 LAB — PREPARE RBC (CROSSMATCH)

## 2023-07-04 MED ORDER — ACETAMINOPHEN 500 MG PO TABS
1000.0000 mg | ORAL_TABLET | Freq: Four times a day (QID) | ORAL | Status: DC
  Administered 2023-07-04 – 2023-07-07 (×8): 1000 mg via ORAL
  Filled 2023-07-04 (×9): qty 2

## 2023-07-04 MED ORDER — DIPHENHYDRAMINE HCL 50 MG/ML IJ SOLN: 12.5000 mg | Freq: Four times a day (QID) | INTRAMUSCULAR | Status: DC | PRN

## 2023-07-04 MED ORDER — GABAPENTIN 100 MG PO CAPS
200.0000 mg | ORAL_CAPSULE | Freq: Two times a day (BID) | ORAL | Status: DC
  Administered 2023-07-04 – 2023-07-07 (×5): 200 mg via ORAL
  Filled 2023-07-04 (×7): qty 2

## 2023-07-04 MED ORDER — DIPHENHYDRAMINE HCL 12.5 MG/5ML PO ELIX: 12.5000 mg | ORAL_SOLUTION | Freq: Four times a day (QID) | ORAL | Status: DC | PRN

## 2023-07-04 MED ORDER — SODIUM PHOSPHATES 45 MMOLE/15ML IV SOLN
30.0000 mmol | Freq: Once | INTRAVENOUS | Status: AC
  Administered 2023-07-04: 30 mmol via INTRAVENOUS
  Filled 2023-07-04: qty 10

## 2023-07-04 NOTE — Progress Notes (Signed)
 2 Days Post-Op   Subjective/Chief Complaint: Patient got 1 u pRBCs yesterday, but HCT still 20 today.  Did get OOB yesterday.  No n/v.  Hiccups improved.  Pain ok "as long as I'm not moving."  Using PCA.   Objective: Vital signs in last 24 hours: Temp:  [98.1 F (36.7 C)-99.9 F (37.7 C)] 98.4 F (36.9 C) (03/21 0846) Pulse Rate:  [73-102] 93 (03/21 0846) Resp:  [7-24] 15 (03/21 0846) BP: (94-121)/(71-80) 104/80 (03/20 1345) SpO2:  [90 %-99 %] 92 % (03/21 0846) Arterial Line BP: (120-171)/(56-84) 149/71 (03/21 0846) Weight:  [62.3 kg] 62.3 kg (03/21 0600)    Intake/Output from previous day: 03/20 0701 - 03/21 0700 In: 2336 [I.V.:1237.8; Blood:316; IV Piggyback:449.6] Out: 1415 [Urine:980; Emesis/NG output:150; Drains:285] Intake/Output this shift: Total I/O In: 38 [Blood:30; Other:8] Out: -   Gen:  Alert and oriented Resp:  breathing comfortably CV:  RR&R Abd:  soft, non distended, approp tender.  J tube in place. Scant staining on dressing.  Drains right abdomen serosang Ext:  warm, well perfused, SCDs in place.    Lab Results:  Recent Labs    07/03/23 0519 07/03/23 1546 07/04/23 0418  WBC 16.5*  --  19.3*  HGB 6.6* 7.0* 6.0*  HCT 20.2* 21.3* 18.1*  PLT 170  --  103*   BMET Recent Labs    07/03/23 0519 07/04/23 0418  NA 132* 133*  K 4.2 3.9  CL 106 104  CO2 21* 23  GLUCOSE 155* 114*  BUN 18 17  CREATININE 0.89 0.71  CALCIUM 7.7* 7.9*   PT/INR Recent Labs    07/03/23 0519 07/04/23 0418  LABPROT 16.7* 17.2*  INR 1.3* 1.4*   ABG Recent Labs    07/02/23 1204 07/02/23 1432  PHART 7.375 7.367  HCO3 23.1 23.2    Studies/Results: No results found.  Anti-infectives: Anti-infectives (From admission, onward)    Start     Dose/Rate Route Frequency Ordered Stop   07/02/23 2200  ceFAZolin (ANCEF) IVPB 2g/100 mL premix        2 g 200 mL/hr over 30 Minutes Intravenous Every 8 hours 07/02/23 1814 07/02/23 2240   07/02/23 0730  ceFAZolin (ANCEF)  IVPB 2g/100 mL premix        2 g 200 mL/hr over 30 Minutes Intravenous On call to O.R. 07/02/23 0719 07/02/23 1322       Assessment/Plan: s/p Procedure(s) with comments: LAPAROSCOPY, DIAGNOSTIC (N/A) - GENERAL AND EPIDURAL 420 WHIPPLE PROCEDURE (N/A) JEJUNOSTOMY TUBE (N/A) 07/02/23 Johnisha Louks  POD 2  Adenocarcinoma head of pancreas, cT2N0M0, s/p neoadjuvant chemotherapy S/p whipple; Await pathology  Acute blood loss anemia on top of chronic anemia from chemotherapy. Starting pre op HCT 24.5.  - transfused 1 u prbcs 3/20.  Got 1 unit intra-op 3/19 for pre op HCT 24 although EBL only 450.  Giving additional 2 units today.   Hypotension - resolved with decreased epidural and albumin bolus.  Holding home antihypertensives.  - 25% albumin boluses x 48 hours.  - d/c art line.  Pain control - epidural and PCA.  IV tylenol. Iv robaxin.  - add oral tylenol and gabapentin  Hyperglycemia  - added SSI.  - may need to add standing insulin if FSG increases on tube feeds.   FEN - d/c ngt. Bariatric clears today.  - protonix for GI ppx - continue foley for urinary output monitoring.   Hypomagnesemia- resolved Hypophosphatemia- replete  VTE ppx- TEDS, sq heparin- hold today given need for additional blood.  Dispo:  therapies, stay in ICU today   LOS: 2 days    [name] 07/04/2023

## 2023-07-04 NOTE — Plan of Care (Signed)
   Problem: Education: Goal: Knowledge of General Education information will improve Description Including pain rating scale, medication(s)/side effects and non-pharmacologic comfort measures Outcome: Progressing   Problem: Health Behavior/Discharge Planning: Goal: Ability to manage health-related needs will improve Outcome: Progressing

## 2023-07-04 NOTE — Anesthesia Post-op Follow-up Note (Signed)
  Anesthesia Pain Follow-up Note  Patient: Randy Allen  Day #: 2  Date of Follow-up: 07/04/2023 Time: 5:18 PM  Last Vitals:  Vitals:   07/04/23 1558 07/04/23 1600  BP:  121/85  Pulse:  93  Resp: 15 15  Temp:  37.6 C  SpO2:  93%    Level of Consciousness: alert  Pain: mild   Side Effects:None  Catheter Site Exam:clean, dry  Epidural / Intrathecal (From admission, onward)    Start     Dose/Rate Route Frequency Ordered Stop   07/02/23 2245  ropivacaine (PF) 2 mg/mL (0.2%) (NAROPIN) injection        8 mL/hr 8 mL/hr  Epidural Continuous 07/02/23 2158          Plan: Continue current therapy of postop epidural at surgeon's request  Elaisha Zahniser L Reyden Smith

## 2023-07-04 NOTE — Addendum Note (Signed)
 Addendum  created 07/04/23 1722 by Elmer Picker, MD   Clinical Note Signed

## 2023-07-04 NOTE — Progress Notes (Addendum)
 Night shift RN notified this RN of 6.0 HBG result in morning lab during shift report. Assessed patient and updated Dr. Donell Beers on results. 2 Units of RBC's ordered.   1330: Repeat HBG after 2 units 8.0

## 2023-07-05 DIAGNOSIS — G8918 Other acute postprocedural pain: Secondary | ICD-10-CM | POA: Diagnosis not present

## 2023-07-05 LAB — CBC
HCT: 19.4 % — ABNORMAL LOW (ref 39.0–52.0)
HCT: 27.3 % — ABNORMAL LOW (ref 39.0–52.0)
Hemoglobin: 6.5 g/dL — CL (ref 13.0–17.0)
Hemoglobin: 9.5 g/dL — ABNORMAL LOW (ref 13.0–17.0)
MCH: 32.5 pg (ref 26.0–34.0)
MCH: 32.5 pg (ref 26.0–34.0)
MCHC: 33.5 g/dL (ref 30.0–36.0)
MCHC: 34.8 g/dL (ref 30.0–36.0)
MCV: 97 fL (ref 80.0–100.0)
MCV: 93.5 fL (ref 80.0–100.0)
Platelets: 99 10*3/uL — ABNORMAL LOW (ref 150–400)
Platelets: 100 10*3/uL — ABNORMAL LOW (ref 150–400)
RBC: 2 MIL/uL — ABNORMAL LOW (ref 4.22–5.81)
RBC: 2.92 MIL/uL — ABNORMAL LOW (ref 4.22–5.81)
RDW: 21.2 % — ABNORMAL HIGH (ref 11.5–15.5)
RDW: 18.8 % — ABNORMAL HIGH (ref 11.5–15.5)
WBC: 17 10*3/uL — ABNORMAL HIGH (ref 4.0–10.5)
WBC: 19.2 10*3/uL — ABNORMAL HIGH (ref 4.0–10.5)
nRBC: 0 % (ref 0.0–0.2)
nRBC: 0 % (ref 0.0–0.2)

## 2023-07-05 LAB — COMPREHENSIVE METABOLIC PANEL
ALT: 19 U/L (ref 0–44)
AST: 27 U/L (ref 15–41)
Albumin: 3.3 g/dL — ABNORMAL LOW (ref 3.5–5.0)
Alkaline Phosphatase: 114 U/L (ref 38–126)
Anion gap: 7 (ref 5–15)
BUN: 13 mg/dL (ref 8–23)
CO2: 24 mmol/L (ref 22–32)
Calcium: 8.4 mg/dL — ABNORMAL LOW (ref 8.9–10.3)
Chloride: 107 mmol/L (ref 98–111)
Creatinine, Ser: 0.65 mg/dL (ref 0.61–1.24)
GFR, Estimated: >60 mL/min (ref >60)
Glucose, Bld: 135 mg/dL — ABNORMAL HIGH (ref 70–99)
Potassium: 3.8 mmol/L (ref 3.5–5.1)
Sodium: 138 mmol/L (ref 135–145)
Total Bilirubin: 1 mg/dL (ref 0.0–1.2)
Total Protein: 4.9 g/dL — ABNORMAL LOW (ref 6.5–8.1)

## 2023-07-05 LAB — GLUCOSE, CAPILLARY
Glucose-Capillary: 121 mg/dL — ABNORMAL HIGH (ref 70–99)
Glucose-Capillary: 111 mg/dL — ABNORMAL HIGH (ref 70–99)
Glucose-Capillary: 107 mg/dL — ABNORMAL HIGH (ref 70–99)
Glucose-Capillary: 119 mg/dL — ABNORMAL HIGH (ref 70–99)
Glucose-Capillary: 128 mg/dL — ABNORMAL HIGH (ref 70–99)
Glucose-Capillary: 134 mg/dL — ABNORMAL HIGH (ref 70–99)

## 2023-07-05 LAB — PROTIME-INR
INR: 1.3 — ABNORMAL HIGH (ref 0.8–1.2)
Prothrombin Time: 16.7 s — ABNORMAL HIGH (ref 11.4–15.2)

## 2023-07-05 LAB — PHOSPHORUS: Phosphorus: 2.1 mg/dL — ABNORMAL LOW (ref 2.5–4.6)

## 2023-07-05 LAB — MAGNESIUM: Magnesium: 2 mg/dL (ref 1.7–2.4)

## 2023-07-05 LAB — PREPARE RBC (CROSSMATCH)

## 2023-07-05 MED ORDER — SODIUM PHOSPHATES 45 MMOLE/15ML IV SOLN
45.0000 mmol | Freq: Once | INTRAVENOUS | Status: AC
  Administered 2023-07-05: 45 mmol via INTRAVENOUS
  Filled 2023-07-05: qty 15

## 2023-07-05 MED ORDER — SODIUM CHLORIDE 0.9% IV SOLUTION: Freq: Once | INTRAVENOUS | Status: AC

## 2023-07-05 MED ORDER — ATORVASTATIN CALCIUM 40 MG PO TABS
40.0000 mg | ORAL_TABLET | Freq: Every day | ORAL | Status: DC
Start: 2023-07-05 — End: 2023-07-07
  Administered 2023-07-05 – 2023-07-07 (×3): 40 mg via ORAL
  Filled 2023-07-05 (×2): qty 1
  Filled 2023-07-05: qty 4

## 2023-07-05 MED ORDER — MORPHINE SULFATE (PF) 4 MG/ML IV SOLN
4.0000 mg | INTRAVENOUS | Status: DC | PRN
  Administered 2023-07-06 – 2023-07-07 (×12): 4 mg via INTRAVENOUS
  Filled 2023-07-05 (×13): qty 1

## 2023-07-05 MED ORDER — OXYCODONE HCL 5 MG PO TABS
5.0000 mg | ORAL_TABLET | ORAL | Status: DC | PRN
  Administered 2023-07-07: 5 mg via ORAL
  Filled 2023-07-05: qty 1

## 2023-07-05 MED ORDER — PANTOPRAZOLE SODIUM 40 MG IV SOLR
80.0000 mg | Freq: Two times a day (BID) | INTRAVENOUS | Status: DC
  Administered 2023-07-05 – 2023-07-07 (×5): 80 mg via INTRAVENOUS
  Filled 2023-07-05 (×6): qty 20

## 2023-07-05 MED ORDER — ONDANSETRON 4 MG PO TBDP
4.0000 mg | ORAL_TABLET | Freq: Three times a day (TID) | ORAL | Status: DC
  Filled 2023-07-05: qty 1

## 2023-07-05 MED ORDER — OXYCODONE HCL 5 MG PO TABS: 5.0000 mg | ORAL_TABLET | ORAL | Status: DC | PRN

## 2023-07-05 MED ORDER — PANTOPRAZOLE SODIUM 40 MG IV SOLR: 80.0000 mg | Freq: Two times a day (BID) | INTRAVENOUS | Status: DC

## 2023-07-05 MED ORDER — ONDANSETRON HCL 4 MG/2ML IJ SOLN
4.0000 mg | Freq: Three times a day (TID) | INTRAMUSCULAR | Status: DC
  Administered 2023-07-05 – 2023-07-09 (×11): 4 mg via INTRAVENOUS
  Filled 2023-07-05 (×11): qty 2

## 2023-07-05 MED ORDER — PROCHLORPERAZINE EDISYLATE 10 MG/2ML IJ SOLN
10.0000 mg | Freq: Four times a day (QID) | INTRAMUSCULAR | Status: DC | PRN
  Administered 2023-07-06 (×3): 10 mg via INTRAVENOUS
  Filled 2023-07-05 (×3): qty 2

## 2023-07-05 MED ORDER — LEVOTHYROXINE SODIUM 75 MCG PO TABS
75.0000 ug | ORAL_TABLET | Freq: Every day | ORAL | Status: DC
  Administered 2023-07-05: 75 ug via ORAL
  Filled 2023-07-05: qty 1

## 2023-07-05 MED ORDER — ALBUMIN HUMAN 25 % IV SOLN
INTRAVENOUS | Status: AC
  Administered 2023-07-05: 25 g via INTRAVENOUS
  Filled 2023-07-05: qty 100

## 2023-07-05 MED ORDER — SODIUM CHLORIDE 0.9 % IV BOLUS
500.0000 mL | Freq: Once | INTRAVENOUS | Status: AC
  Administered 2023-07-05: 500 mL via INTRAVENOUS

## 2023-07-05 NOTE — Progress Notes (Addendum)
 Patient had bloody emesis. Patient uses yankauer suction by himself. Patient reports bloody emesis throughout the day. Approximately 100cc of bloody emesis noted in suction canister. No documentation of the same. PRN Zofran given.   Dr. Azucena Cecil notified of the same.

## 2023-07-05 NOTE — Progress Notes (Signed)
 15 cc morphine wasted in med room SteriCycle with Sheppard Coil, RN.   - Lady Deutscher, RN

## 2023-07-05 NOTE — Progress Notes (Addendum)
 Patient ID: Randy Allen, male   DOB: 07/20/1960, 63 y.o.   MRN: [id number] 3 Days Post-Op    Subjective: 2u PRBC started, pain control is good, no nausea ROS negative except as listed above. Objective: Vital signs in last 24 hours: Temp:  [98.1 F (36.7 C)-99.7 F (37.6 C)] 99 F (37.2 C) (03/22 0938) Pulse Rate:  [75-94] 75 (03/22 0938) Resp:  [11-32] 18 (03/22 0938) BP: (94-137)/(64-99) 120/82 (03/22 0938) SpO2:  [91 %-96 %] 95 % (03/22 0938) Arterial Line BP: (147-166)/(74-86) 166/86 (03/21 1045) Weight:  [62.5 kg] 62.5 kg (03/22 0404) Last BM Date :  (PTA)  Intake/Output from previous day: 03/21 0701 - 03/22 0700 In: 1164.7 [P.O.:100; Blood:30; NG/GT:382; IV Piggyback:460.6] Out: 1825 [Urine:980; Emesis/NG output:220; Drains:625] Intake/Output this shift: Total I/O In: 932.5 [P.O.:120; I.V.:100; Blood:684; Other:8.1; NG/GT:20.3] Out: -   General appearance: alert and cooperative GI: dressing CDI, soft, ND, drains SS. J tube working  Lab Results: CBC  Recent Labs    07/04/23 0418 07/04/23 1221 07/05/23 0353  WBC 19.3*  --  17.0*  HGB 6.0* 8.0* 6.5*  HCT 18.1* 23.4* 19.4*  PLT 103*  --  99*   BMET Recent Labs    07/04/23 0418 07/05/23 0353  NA 133* 138  K 3.9 3.8  CL 104 107  CO2 23 24  GLUCOSE 114* 135*  BUN 17 13  CREATININE 0.71 0.65  CALCIUM 7.9* 8.4*   PT/INR Recent Labs    07/03/23 0519 07/04/23 0418  LABPROT 16.7* 17.2*  INR 1.3* 1.4*   ABG Recent Labs    07/02/23 1204 07/02/23 1432  PHART 7.375 7.367  HCO3 23.1 23.2    Studies/Results: No results found.  Anti-infectives: Anti-infectives (From admission, onward)    Start     Dose/Rate Route Frequency Ordered Stop   07/02/23 2200  ceFAZolin (ANCEF) IVPB 2g/100 mL premix        2 g 200 mL/hr over 30 Minutes Intravenous Every 8 hours 07/02/23 1814 07/02/23 2240   07/02/23 0730  ceFAZolin (ANCEF) IVPB 2g/100 mL premix        2 g 200 mL/hr over 30 Minutes Intravenous On call  to O.R. 07/02/23 0719 07/02/23 1322       Assessment/Plan: Adenocarcinoma head of pancreas, cT2N0M0, s/p neoadjuvant chemotherapy POD 3 S/p whipple; Await pathology  Acute blood loss anemia on top of chronic anemia from chemotherapy. Starting pre op HCT 24.5.  - transfused 1 u prbcs 3/20.  Got 1 unit intra-op 3/19 for pre op HCT 24 although EBL only 450.  Giving additional 2 units again today.   Hypotension - resolved - 25% albumin boluses x 48 hours.   Pain control - epidural and PCA.  Iv robaxin.  - oral tylenol and gabapentin  Hyperglycemia  - SSI.  - CBGs OK  FEN - Bariatric clears  - high dose protonix for GI ppx today - continue foley for urinary output monitoring.  - LFTs normalized  Hypomagnesemia- resolved Hypophosphatemia- replete  VTE ppx- TEDS, sq heparin- hold again today given need for additional blood.    Dispo:  therapies, stay in ICU today   LOS: 3 days    Violeta Gelinas, MD, MPH, FACS Trauma & General Surgery Use [email or web address] to contact on call provider  07/05/2023

## 2023-07-05 NOTE — Plan of Care (Signed)
  Problem: Education: Goal: Knowledge of General Education information will improve Description: Including pain rating scale, medication(s)/side effects and non-pharmacologic comfort measures Outcome: Progressing   Problem: Health Behavior/Discharge Planning: Goal: Ability to manage health-related needs will improve Outcome: Progressing   Problem: Clinical Measurements: Goal: Ability to maintain clinical measurements within normal limits will improve Outcome: Progressing Goal: Will remain free from infection Outcome: Progressing Goal: Diagnostic test results will improve Outcome: Progressing Goal: Respiratory complications will improve Outcome: Progressing Goal: Cardiovascular complication will be avoided Outcome: Progressing   Problem: Activity: Goal: Risk for activity intolerance will decrease Outcome: Progressing   Problem: Nutrition: Goal: Adequate nutrition will be maintained Outcome: Progressing   Problem: Coping: Goal: Level of anxiety will decrease Outcome: Progressing   Problem: Elimination: Goal: Will not experience complications related to urinary retention Outcome: Progressing   Problem: Pain Managment: Goal: General experience of comfort will improve and/or be controlled Outcome: Progressing   Problem: Safety: Goal: Ability to remain free from injury will improve Outcome: Progressing   Problem: Skin Integrity: Goal: Risk for impaired skin integrity will decrease Outcome: Progressing   Problem: Education: Goal: Ability to describe self-care measures that may prevent or decrease complications (Diabetes Survival Skills Education) will improve Outcome: Progressing Goal: Individualized Educational Video(s) Outcome: Progressing   Problem: Coping: Goal: Ability to adjust to condition or change in health will improve Outcome: Progressing   Problem: Fluid Volume: Goal: Ability to maintain a balanced intake and output will improve Outcome: Progressing    Problem: Health Behavior/Discharge Planning: Goal: Ability to identify and utilize available resources and services will improve Outcome: Progressing Goal: Ability to manage health-related needs will improve Outcome: Progressing   Problem: Metabolic: Goal: Ability to maintain appropriate glucose levels will improve Outcome: Progressing   Problem: Nutritional: Goal: Maintenance of adequate nutrition will improve Outcome: Progressing Goal: Progress toward achieving an optimal weight will improve Outcome: Progressing   Problem: Skin Integrity: Goal: Risk for impaired skin integrity will decrease Outcome: Progressing   Problem: Tissue Perfusion: Goal: Adequacy of tissue perfusion will improve Outcome: Progressing   Problem: Elimination: Goal: Will not experience complications related to bowel motility Outcome: Not Progressing

## 2023-07-05 NOTE — Addendum Note (Signed)
 Addendum  created 07/05/23 1724 by Val Eagle, MD   Clinical Note Signed

## 2023-07-05 NOTE — Anesthesia Post-op Follow-up Note (Signed)
  Anesthesia Pain Follow-up Note  Patient: Randy Allen  Day #: 3  Date of Follow-up: 07/05/2023 Time: 5:23 PM  Last Vitals:  Vitals:   07/05/23 1600 07/05/23 1700  BP: (!) 141/89 (!) 149/89  Pulse: 84 93  Resp: (!) 22 (!) 22  Temp:    SpO2: 93% 91%    Level of Consciousness: alert  Pain: mild   Side Effects:None  Catheter Site Exam:clean, dry  Epidural / Intrathecal (From admission, onward)    Start     Dose/Rate Route Frequency Ordered Stop   07/02/23 2245  ropivacaine (PF) 2 mg/mL (0.2%) (NAROPIN) injection        8 mL/hr 8 mL/hr  Epidural Continuous 07/02/23 2158          Plan: Continue current therapy of postop epidural at surgeon's request  Kaylana Fenstermacher

## 2023-07-05 NOTE — Progress Notes (Signed)
 Patient had 250cc of dark red emesis from 1800-1900. Patient reports nausea, denied pain. Patient transferred from bed to bedside commode, had a small bowel movement. Bowel movement was black with bright red streaks. Patient denied pain with bowel movement. Patient normotensive, denied dizziness. Patient transferred back to bed with no incident.   Dr. Azucena Cecil notified of the same.

## 2023-07-05 NOTE — Progress Notes (Signed)
 Pt coughed up about 100 cc dark red blood in canister. Pt has not coughed up any blood today until 1800, but endorsed coughing up blood overnight. Pt endorsing nausea all day today. Will give next dose of PRN zofran at 1820.   MD Janee Morn notified. Repeat CBC at 1500 showed Hgb 9.5. Up from 6.5 this AM after receiving two units of RBCs. Abdominal pain still 2-3/10, no worsening distention or any new findings on assessment. Will continue to monitor.   - Lady Deutscher, RN

## 2023-07-06 ENCOUNTER — Inpatient Hospital Stay (HOSPITAL_COMMUNITY): Admitting: Anesthesiology

## 2023-07-06 DIAGNOSIS — G8918 Other acute postprocedural pain: Secondary | ICD-10-CM | POA: Diagnosis not present

## 2023-07-06 LAB — BPAM RBC
Blood Product Expiration Date: 202503262359
Blood Product Expiration Date: 202504062359
Blood Product Expiration Date: 202504082359
Blood Product Expiration Date: 202504092359
Blood Product Expiration Date: 202504092359
Blood Product Expiration Date: 202504122359
Blood Product Expiration Date: 202504122359
Blood Product Expiration Date: 202504142359
Blood Product Expiration Date: 202504152359
ISSUE DATE / TIME: 202503190918
ISSUE DATE / TIME: 202503200753
ISSUE DATE / TIME: 202503200924
ISSUE DATE / TIME: 202503201321
ISSUE DATE / TIME: 202503201321
ISSUE DATE / TIME: 202503210824
ISSUE DATE / TIME: 202503210824
ISSUE DATE / TIME: 202503220532
ISSUE DATE / TIME: 202503220532
Unit Type and Rh: 6200
Unit Type and Rh: 6200
Unit Type and Rh: 6200
Unit Type and Rh: 6200
Unit Type and Rh: 6200
Unit Type and Rh: 6200
Unit Type and Rh: 6200
Unit Type and Rh: 6200
Unit Type and Rh: 6200

## 2023-07-06 LAB — TYPE AND SCREEN
ABO/RH(D): A POS
Antibody Screen: NEGATIVE
Unit division: 0
Unit division: 0
Unit division: 0
Unit division: 0
Unit division: 0
Unit division: 0
Unit division: 0
Unit division: 0
Unit division: 0

## 2023-07-06 LAB — COMPREHENSIVE METABOLIC PANEL
ALT: 23 U/L (ref 0–44)
AST: 28 U/L (ref 15–41)
Albumin: 3.1 g/dL — ABNORMAL LOW (ref 3.5–5.0)
Alkaline Phosphatase: 147 U/L — ABNORMAL HIGH (ref 38–126)
Anion gap: 8 (ref 5–15)
BUN: 21 mg/dL (ref 8–23)
CO2: 24 mmol/L (ref 22–32)
Calcium: 8.5 mg/dL — ABNORMAL LOW (ref 8.9–10.3)
Chloride: 107 mmol/L (ref 98–111)
Creatinine, Ser: 0.67 mg/dL (ref 0.61–1.24)
GFR, Estimated: >60 mL/min (ref >60)
Glucose, Bld: 150 mg/dL — ABNORMAL HIGH (ref 70–99)
Potassium: 3.6 mmol/L (ref 3.5–5.1)
Sodium: 139 mmol/L (ref 135–145)
Total Bilirubin: 1.1 mg/dL (ref 0.0–1.2)
Total Protein: 5.3 g/dL — ABNORMAL LOW (ref 6.5–8.1)

## 2023-07-06 LAB — CBC
HCT: 25.4 % — ABNORMAL LOW (ref 39.0–52.0)
Hemoglobin: 8.8 g/dL — ABNORMAL LOW (ref 13.0–17.0)
MCH: 32.7 pg (ref 26.0–34.0)
MCHC: 34.6 g/dL (ref 30.0–36.0)
MCV: 94.4 fL (ref 80.0–100.0)
Platelets: 113 10*3/uL — ABNORMAL LOW (ref 150–400)
RBC: 2.69 MIL/uL — ABNORMAL LOW (ref 4.22–5.81)
RDW: 19.5 % — ABNORMAL HIGH (ref 11.5–15.5)
WBC: 20.4 10*3/uL — ABNORMAL HIGH (ref 4.0–10.5)
nRBC: 0 % (ref 0.0–0.2)

## 2023-07-06 LAB — GLUCOSE, CAPILLARY
Glucose-Capillary: 143 mg/dL — ABNORMAL HIGH (ref 70–99)
Glucose-Capillary: 142 mg/dL — ABNORMAL HIGH (ref 70–99)
Glucose-Capillary: 147 mg/dL — ABNORMAL HIGH (ref 70–99)
Glucose-Capillary: 143 mg/dL — ABNORMAL HIGH (ref 70–99)
Glucose-Capillary: 124 mg/dL — ABNORMAL HIGH (ref 70–99)
Glucose-Capillary: 133 mg/dL — ABNORMAL HIGH (ref 70–99)

## 2023-07-06 LAB — MAGNESIUM: Magnesium: 2 mg/dL (ref 1.7–2.4)

## 2023-07-06 LAB — PHOSPHORUS: Phosphorus: 3.7 mg/dL (ref 2.5–4.6)

## 2023-07-06 MED ORDER — ALBUMIN HUMAN 5 % IV SOLN
12.5000 g | Freq: Once | INTRAVENOUS | Status: AC
  Administered 2023-07-06: 12.5 g via INTRAVENOUS
  Filled 2023-07-06: qty 250

## 2023-07-06 MED ORDER — OXYMETAZOLINE HCL 0.05 % NA SOLN
1.0000 | Freq: Two times a day (BID) | NASAL | Status: DC | PRN
  Administered 2023-07-06: 1 via NASAL
  Filled 2023-07-06: qty 30

## 2023-07-06 MED ORDER — LACTATED RINGERS IV SOLN: INTRAVENOUS | Status: AC

## 2023-07-06 NOTE — Progress Notes (Signed)
 Patient ID: Randy Allen, male   DOB: 1961-02-13, 63 y.o.   MRN: [id number] 4 Days Post-Op    Subjective: No further bloody emesis so far today, not wanting to take much PO. Having some post nasal drip but does not want to take a med for it. ROS negative except as listed above. Objective: Vital signs in last 24 hours: Temp:  [98.3 F (36.8 C)-99.6 F (37.6 C)] 99.1 F (37.3 C) (03/23 0800) Pulse Rate:  [75-102] 91 (03/23 0700) Resp:  [14-40] 20 (03/23 0700) BP: (103-149)/(77-96) 130/94 (03/23 0700) SpO2:  [91 %-97 %] 94 % (03/23 0700) Weight:  [64.1 kg] 64.1 kg (03/23 0458) Last BM Date :  (PTA)  Intake/Output from previous day: 03/22 0701 - 03/23 0700 In: 2333.3 [P.O.:120; I.V.:100; Blood:684; NG/GT:480; IV Piggyback:763.5] Out: 1755 [Urine:725; Emesis/NG output:500; Drains:530] Intake/Output this shift: No intake/output data recorded.  General appearance: alert and cooperative GI: soft, incision CDI, JPs ? Bile tinge SS, J tube working  Lab Results: CBC  Recent Labs    07/05/23 0353 07/05/23 1530  WBC 17.0* 19.2*  HGB 6.5* 9.5*  HCT 19.4* 27.3*  PLT 99* 100*   BMET Recent Labs    07/04/23 0418 07/05/23 0353  NA 133* 138  K 3.9 3.8  CL 104 107  CO2 23 24  GLUCOSE 114* 135*  BUN 17 13  CREATININE 0.71 0.65  CALCIUM 7.9* 8.4*   PT/INR Recent Labs    07/04/23 0418 07/05/23 1132  LABPROT 17.2* 16.7*  INR 1.4* 1.3*   ABG No results for input(s): "PHART", "HCO3" in the last 72 hours.  Invalid input(s): "PCO2", "PO2"  Studies/Results: No results found.  Anti-infectives: Anti-infectives (From admission, onward)    Start     Dose/Rate Route Frequency Ordered Stop   07/02/23 2200  ceFAZolin (ANCEF) IVPB 2g/100 mL premix        2 g 200 mL/hr over 30 Minutes Intravenous Every 8 hours 07/02/23 1814 07/02/23 2240   07/02/23 0730  ceFAZolin (ANCEF) IVPB 2g/100 mL premix        2 g 200 mL/hr over 30 Minutes Intravenous On call to O.R. 07/02/23 0719  07/02/23 1322       Assessment/Plan: Adenocarcinoma head of pancreas, cT2N0M0, s/p neoadjuvant chemotherapy POD 4 S/p whipple; Await pathology  Acute blood loss anemia on top of chronic anemia from chemotherapy. Starting pre op HCT 24.5.  - having some anastamotic bleeding/bloody emesis. 2u PRBC yesterday. Hb pending now. Holding SQ heparin.  FEN - 25% albumin boluses x 48 hours.  - add IVF at 75/h today as nopt taking much in. TF at 20 - hold on advancine with vomiting  Pain control - oxy, morphine PRN - oral tylenol and gabapentin  Hyperglycemia  - SSI.  - CBGs OK  FEN - Bariatric clears  - high dose protonix for GI ppx today - continue foley for urinary output monitoring.  - LFTs P  VTE ppx- TEDS, sq heparin- hold again today given above  Dispo:  therapies, stay in ICU today   LOS: 4 days    Violeta Gelinas, MD, MPH, FACS Trauma & General Surgery Use [email or web address] to contact on call provider  07/06/2023

## 2023-07-06 NOTE — Plan of Care (Signed)

## 2023-07-06 NOTE — Anesthesia Post-op Follow-up Note (Signed)
  Anesthesia Pain Follow-up Note  Patient: Randy Allen  Day #: 4  Date of Follow-up: 07/06/2023 Time: 2:43 PM  Last Vitals:  Vitals:   07/06/23 1200 07/06/23 1300  BP: 130/84 129/89  Pulse: 93 97  Resp: (!) 21 20  Temp: 36.8 C   SpO2: 94% 94%    Level of Consciousness: alert  Pain: 2 /10   Side Effects:None  Catheter Site Exam:clean, dry  Epidural / Intrathecal (From admission, onward)    Start     Dose/Rate Route Frequency Ordered Stop   07/02/23 2245  ropivacaine (PF) 2 mg/mL (0.2%) (NAROPIN) injection        8 mL/hr 8 mL/hr  Epidural Continuous 07/02/23 2158          Plan: Continue current therapy of postop epidural at surgeon's request  Shelton Silvas

## 2023-07-06 NOTE — Plan of Care (Signed)
  Problem: Education: Goal: Knowledge of General Education information will improve Description: Including pain rating scale, medication(s)/side effects and non-pharmacologic comfort measures Outcome: Progressing   Problem: Health Behavior/Discharge Planning: Goal: Ability to manage health-related needs will improve Outcome: Progressing   Problem: Clinical Measurements: Goal: Ability to maintain clinical measurements within normal limits will improve Outcome: Progressing Goal: Will remain free from infection Outcome: Progressing Goal: Diagnostic test results will improve Outcome: Progressing Goal: Respiratory complications will improve Outcome: Progressing Goal: Cardiovascular complication will be avoided Outcome: Progressing   Problem: Activity: Goal: Risk for activity intolerance will decrease Outcome: Progressing   Problem: Nutrition: Goal: Adequate nutrition will be maintained Outcome: Progressing   Problem: Elimination: Goal: Will not experience complications related to bowel motility Outcome: Progressing   Problem: Pain Managment: Goal: General experience of comfort will improve and/or be controlled Outcome: Progressing   Problem: Safety: Goal: Ability to remain free from injury will improve Outcome: Progressing   Problem: Skin Integrity: Goal: Risk for impaired skin integrity will decrease Outcome: Progressing   Problem: Education: Goal: Ability to describe self-care measures that may prevent or decrease complications (Diabetes Survival Skills Education) will improve Outcome: Progressing Goal: Individualized Educational Video(s) Outcome: Progressing   Problem: Coping: Goal: Ability to adjust to condition or change in health will improve Outcome: Progressing   Problem: Fluid Volume: Goal: Ability to maintain a balanced intake and output will improve Outcome: Progressing   Problem: Health Behavior/Discharge Planning: Goal: Ability to identify and  utilize available resources and services will improve Outcome: Progressing Goal: Ability to manage health-related needs will improve Outcome: Progressing   Problem: Metabolic: Goal: Ability to maintain appropriate glucose levels will improve Outcome: Progressing   Problem: Nutritional: Goal: Maintenance of adequate nutrition will improve Outcome: Progressing Goal: Progress toward achieving an optimal weight will improve Outcome: Progressing   Problem: Skin Integrity: Goal: Risk for impaired skin integrity will decrease Outcome: Progressing   Problem: Tissue Perfusion: Goal: Adequacy of tissue perfusion will improve Outcome: Progressing   Problem: Coping: Goal: Level of anxiety will decrease Outcome: Not Progressing   Problem: Elimination: Goal: Will not experience complications related to urinary retention Outcome: Not Progressing

## 2023-07-07 ENCOUNTER — Inpatient Hospital Stay (HOSPITAL_COMMUNITY)

## 2023-07-07 DIAGNOSIS — A419 Sepsis, unspecified organism: Secondary | ICD-10-CM

## 2023-07-07 DIAGNOSIS — R579 Shock, unspecified: Secondary | ICD-10-CM

## 2023-07-07 DIAGNOSIS — J9621 Acute and chronic respiratory failure with hypoxia: Secondary | ICD-10-CM

## 2023-07-07 DIAGNOSIS — J9601 Acute respiratory failure with hypoxia: Secondary | ICD-10-CM

## 2023-07-07 DIAGNOSIS — R6521 Severe sepsis with septic shock: Secondary | ICD-10-CM | POA: Diagnosis not present

## 2023-07-07 DIAGNOSIS — J90 Pleural effusion, not elsewhere classified: Secondary | ICD-10-CM | POA: Diagnosis not present

## 2023-07-07 DIAGNOSIS — G9341 Metabolic encephalopathy: Secondary | ICD-10-CM | POA: Diagnosis not present

## 2023-07-07 LAB — POCT I-STAT 7, (LYTES, BLD GAS, ICA,H+H)
Acid-Base Excess: 1 mmol/L (ref 0.0–2.0)
Acid-base deficit: 8 mmol/L — ABNORMAL HIGH (ref 0.0–2.0)
Bicarbonate: 24.1 mmol/L (ref 20.0–28.0)
Bicarbonate: 16.9 mmol/L — ABNORMAL LOW (ref 20.0–28.0)
Calcium, Ion: 1.22 mmol/L (ref 1.15–1.40)
Calcium, Ion: 1.11 mmol/L — ABNORMAL LOW (ref 1.15–1.40)
HCT: 28 % — ABNORMAL LOW (ref 39.0–52.0)
HCT: 25 % — ABNORMAL LOW (ref 39.0–52.0)
Hemoglobin: 9.5 g/dL — ABNORMAL LOW (ref 13.0–17.0)
Hemoglobin: 8.5 g/dL — ABNORMAL LOW (ref 13.0–17.0)
O2 Saturation: 95 %
O2 Saturation: 100 %
Patient temperature: 98.6
Potassium: 3.7 mmol/L (ref 3.5–5.1)
Potassium: 3.4 mmol/L — ABNORMAL LOW (ref 3.5–5.1)
Sodium: 140 mmol/L (ref 135–145)
Sodium: 138 mmol/L (ref 135–145)
TCO2: 25 mmol/L (ref 22–32)
TCO2: 18 mmol/L — ABNORMAL LOW (ref 22–32)
pCO2 arterial: 32.1 mmHg (ref 32–48)
pCO2 arterial: 29.9 mmHg — ABNORMAL LOW (ref 32–48)
pH, Arterial: 7.483 — ABNORMAL HIGH (ref 7.35–7.45)
pH, Arterial: 7.359 (ref 7.35–7.45)
pO2, Arterial: 69 mmHg — ABNORMAL LOW (ref 83–108)
pO2, Arterial: 233 mmHg — ABNORMAL HIGH (ref 83–108)

## 2023-07-07 LAB — CBC
HCT: 28.6 % — ABNORMAL LOW (ref 39.0–52.0)
HCT: 25.8 % — ABNORMAL LOW (ref 39.0–52.0)
HCT: 25.9 % — ABNORMAL LOW (ref 39.0–52.0)
Hemoglobin: 9.4 g/dL — ABNORMAL LOW (ref 13.0–17.0)
Hemoglobin: 8.2 g/dL — ABNORMAL LOW (ref 13.0–17.0)
Hemoglobin: 8.2 g/dL — ABNORMAL LOW (ref 13.0–17.0)
MCH: 32.1 pg (ref 26.0–34.0)
MCH: 32 pg (ref 26.0–34.0)
MCH: 32.2 pg (ref 26.0–34.0)
MCHC: 32.9 g/dL (ref 30.0–36.0)
MCHC: 31.7 g/dL (ref 30.0–36.0)
MCHC: 31.8 g/dL (ref 30.0–36.0)
MCV: 97.6 fL (ref 80.0–100.0)
MCV: 101.2 fL — ABNORMAL HIGH (ref 80.0–100.0)
MCV: 101.2 fL — ABNORMAL HIGH (ref 80.0–100.0)
Platelets: 145 10*3/uL — ABNORMAL LOW (ref 150–400)
Platelets: 156 10*3/uL (ref 150–400)
Platelets: 157 10*3/uL (ref 150–400)
RBC: 2.93 MIL/uL — ABNORMAL LOW (ref 4.22–5.81)
RBC: 2.55 MIL/uL — ABNORMAL LOW (ref 4.22–5.81)
RBC: 2.56 MIL/uL — ABNORMAL LOW (ref 4.22–5.81)
RDW: 18.9 % — ABNORMAL HIGH (ref 11.5–15.5)
RDW: 18.7 % — ABNORMAL HIGH (ref 11.5–15.5)
RDW: 18.8 % — ABNORMAL HIGH (ref 11.5–15.5)
WBC: 31.1 10*3/uL — ABNORMAL HIGH (ref 4.0–10.5)
WBC: 10.5 10*3/uL (ref 4.0–10.5)
WBC: 10.8 10*3/uL — ABNORMAL HIGH (ref 4.0–10.5)
nRBC: 0 % (ref 0.0–0.2)
nRBC: 0.3 % — ABNORMAL HIGH (ref 0.0–0.2)
nRBC: 0.4 % — ABNORMAL HIGH (ref 0.0–0.2)

## 2023-07-07 LAB — COMPREHENSIVE METABOLIC PANEL
ALT: 64 U/L — ABNORMAL HIGH (ref 0–44)
ALT: 113 U/L — ABNORMAL HIGH (ref 0–44)
AST: 125 U/L — ABNORMAL HIGH (ref 15–41)
AST: 231 U/L — ABNORMAL HIGH (ref 15–41)
Albumin: 3 g/dL — ABNORMAL LOW (ref 3.5–5.0)
Albumin: 2.5 g/dL — ABNORMAL LOW (ref 3.5–5.0)
Alkaline Phosphatase: 369 U/L — ABNORMAL HIGH (ref 38–126)
Alkaline Phosphatase: 390 U/L — ABNORMAL HIGH (ref 38–126)
Anion gap: 9 (ref 5–15)
Anion gap: 21 — ABNORMAL HIGH (ref 5–15)
BUN: 24 mg/dL — ABNORMAL HIGH (ref 8–23)
BUN: 34 mg/dL — ABNORMAL HIGH (ref 8–23)
CO2: 24 mmol/L (ref 22–32)
CO2: 13 mmol/L — ABNORMAL LOW (ref 22–32)
Calcium: 8.7 mg/dL — ABNORMAL LOW (ref 8.9–10.3)
Calcium: 8.4 mg/dL — ABNORMAL LOW (ref 8.9–10.3)
Chloride: 107 mmol/L (ref 98–111)
Chloride: 101 mmol/L (ref 98–111)
Creatinine, Ser: 0.75 mg/dL (ref 0.61–1.24)
Creatinine, Ser: 1.46 mg/dL — ABNORMAL HIGH (ref 0.61–1.24)
GFR, Estimated: >60 mL/min (ref >60)
GFR, Estimated: 54 mL/min — ABNORMAL LOW (ref >60)
Glucose, Bld: 161 mg/dL — ABNORMAL HIGH (ref 70–99)
Glucose, Bld: 174 mg/dL — ABNORMAL HIGH (ref 70–99)
Potassium: 3.6 mmol/L (ref 3.5–5.1)
Potassium: 6.2 mmol/L — ABNORMAL HIGH (ref 3.5–5.1)
Sodium: 140 mmol/L (ref 135–145)
Sodium: 135 mmol/L (ref 135–145)
Total Bilirubin: 4.3 mg/dL — ABNORMAL HIGH (ref 0.0–1.2)
Total Bilirubin: 6.4 mg/dL — ABNORMAL HIGH (ref 0.0–1.2)
Total Protein: 5.5 g/dL — ABNORMAL LOW (ref 6.5–8.1)
Total Protein: 4.7 g/dL — ABNORMAL LOW (ref 6.5–8.1)

## 2023-07-07 LAB — DIC (DISSEMINATED INTRAVASCULAR COAGULATION)PANEL
D-Dimer, Quant: 2.42 ug{FEU}/mL — ABNORMAL HIGH (ref 0.00–0.50)
Fibrinogen: 432 mg/dL (ref 210–475)
INR: 2 — ABNORMAL HIGH (ref 0.8–1.2)
Platelets: 149 10*3/uL — ABNORMAL LOW (ref 150–400)
Prothrombin Time: 22.5 s — ABNORMAL HIGH (ref 11.4–15.2)
Smear Review: NONE SEEN
aPTT: 32 s (ref 24–36)

## 2023-07-07 LAB — GLUCOSE, CAPILLARY
Glucose-Capillary: 136 mg/dL — ABNORMAL HIGH (ref 70–99)
Glucose-Capillary: 159 mg/dL — ABNORMAL HIGH (ref 70–99)
Glucose-Capillary: 189 mg/dL — ABNORMAL HIGH (ref 70–99)
Glucose-Capillary: 149 mg/dL — ABNORMAL HIGH (ref 70–99)
Glucose-Capillary: 116 mg/dL — ABNORMAL HIGH (ref 70–99)
Glucose-Capillary: 120 mg/dL — ABNORMAL HIGH (ref 70–99)

## 2023-07-07 LAB — MAGNESIUM: Magnesium: 1.9 mg/dL (ref 1.7–2.4)

## 2023-07-07 LAB — LACTIC ACID, PLASMA: Lactic Acid, Venous: >9 mmol/L (ref 0.5–1.9)

## 2023-07-07 LAB — PHOSPHORUS: Phosphorus: 3.8 mg/dL (ref 2.5–4.6)

## 2023-07-07 LAB — PREPARE RBC (CROSSMATCH)

## 2023-07-07 MED ORDER — IOHEXOL 350 MG/ML SOLN
100.0000 mL | Freq: Once | INTRAVENOUS | Status: AC | PRN
  Administered 2023-07-07: 100 mL via INTRAVENOUS

## 2023-07-07 MED ORDER — POLYETHYLENE GLYCOL 3350 17 G PO PACK: 17.0000 g | PACK | Freq: Every day | ORAL | Status: DC

## 2023-07-07 MED ORDER — SODIUM CHLORIDE 0.9 % IV SOLN
1.0000 g | Freq: Three times a day (TID) | INTRAVENOUS | Status: DC
  Administered 2023-07-07: 1 g via INTRAVENOUS
  Filled 2023-07-07: qty 20

## 2023-07-07 MED ORDER — OSMOLITE 1.5 CAL PO LIQD: 1000.0000 mL | ORAL | Status: DC

## 2023-07-07 MED ORDER — SODIUM BICARBONATE 8.4 % IV SOLN
100.0000 meq | Freq: Once | INTRAVENOUS | Status: AC
  Administered 2023-07-07: 100 meq via INTRAVENOUS

## 2023-07-07 MED ORDER — VANCOMYCIN HCL IN DEXTROSE 1-5 GM/200ML-% IV SOLN: 1000.0000 mg | Freq: Two times a day (BID) | INTRAVENOUS | Status: DC

## 2023-07-07 MED ORDER — POTASSIUM CHLORIDE CRYS ER 20 MEQ PO TBCR
40.0000 meq | EXTENDED_RELEASE_TABLET | Freq: Once | ORAL | Status: AC
  Administered 2023-07-07: 40 meq via ORAL
  Filled 2023-07-07: qty 2

## 2023-07-07 MED ORDER — ETOMIDATE 2 MG/ML IV SOLN
INTRAVENOUS | Status: AC
  Administered 2023-07-07: 20 mg
  Filled 2023-07-07: qty 20

## 2023-07-07 MED ORDER — SODIUM CHLORIDE 0.9 % IV SOLN
2.0000 g | Freq: Three times a day (TID) | INTRAVENOUS | Status: DC
  Administered 2023-07-07: 2 g via INTRAVENOUS
  Filled 2023-07-07: qty 12.5

## 2023-07-07 MED ORDER — MIDAZOLAM HCL 2 MG/2ML IJ SOLN
INTRAMUSCULAR | Status: AC
  Administered 2023-07-07: 2 mg
  Filled 2023-07-07: qty 2

## 2023-07-07 MED ORDER — VANCOMYCIN HCL 1250 MG/250ML IV SOLN
1250.0000 mg | Freq: Once | INTRAVENOUS | Status: AC
Start: 2023-07-07 — End: 2023-07-07
  Administered 2023-07-07: 1250 mg via INTRAVENOUS
  Filled 2023-07-07: qty 250

## 2023-07-07 MED ORDER — OXYBUTYNIN CHLORIDE 5 MG PO TABS
5.0000 mg | ORAL_TABLET | Freq: Two times a day (BID) | ORAL | Status: DC
  Administered 2023-07-07: 5 mg
  Filled 2023-07-07 (×2): qty 1

## 2023-07-07 MED ORDER — MIDAZOLAM HCL 2 MG/2ML IJ SOLN
1.0000 mg | INTRAMUSCULAR | Status: DC | PRN
  Administered 2023-07-07 – 2023-07-09 (×2): 2 mg via INTRAVENOUS
  Filled 2023-07-07 (×3): qty 2

## 2023-07-07 MED ORDER — SODIUM CHLORIDE 0.9% IV SOLUTION: Freq: Once | INTRAVENOUS | Status: DC

## 2023-07-07 MED ORDER — NOREPINEPHRINE 4 MG/250ML-% IV SOLN
INTRAVENOUS | Status: AC
Start: 2023-07-07 — End: 2023-07-07
  Filled 2023-07-07: qty 250

## 2023-07-07 MED ORDER — SUCCINYLCHOLINE CHLORIDE 200 MG/10ML IV SOSY
PREFILLED_SYRINGE | INTRAVENOUS | Status: AC
  Filled 2023-07-07: qty 10

## 2023-07-07 MED ORDER — DIAZEPAM 5 MG/ML IJ SOLN
2.5000 mg | Freq: Once | INTRAMUSCULAR | Status: AC
  Administered 2023-07-07: 2.5 mg via INTRAVENOUS
  Filled 2023-07-07: qty 2

## 2023-07-07 MED ORDER — PROSOURCE TF20 ENFIT COMPATIBL EN LIQD: 60.0000 mL | Freq: Two times a day (BID) | ENTERAL | Status: DC

## 2023-07-07 MED ORDER — OXYCODONE HCL 5 MG PO TABS: 5.0000 mg | ORAL_TABLET | ORAL | Status: DC

## 2023-07-07 MED ORDER — PHENAZOPYRIDINE HCL 100 MG PO TABS: 100.0000 mg | ORAL_TABLET | Freq: Three times a day (TID) | ORAL | Status: DC

## 2023-07-07 MED ORDER — FUROSEMIDE 10 MG/ML IJ SOLN
20.0000 mg | Freq: Once | INTRAMUSCULAR | Status: AC
  Administered 2023-07-07: 20 mg via INTRAVENOUS
  Filled 2023-07-07: qty 2

## 2023-07-07 MED ORDER — OXYBUTYNIN CHLORIDE 5 MG/5ML PO SOLN
5.0000 mg | Freq: Two times a day (BID) | ORAL | Status: DC
  Filled 2023-07-07 (×2): qty 5

## 2023-07-07 MED ORDER — SODIUM CHLORIDE 0.9% FLUSH
10.0000 mL | Freq: Two times a day (BID) | INTRAVENOUS | Status: DC
  Administered 2023-07-07: 20 mL
  Administered 2023-07-08: 30 mL
  Administered 2023-07-08 – 2023-07-09 (×3): 10 mL

## 2023-07-07 MED ORDER — PANTOPRAZOLE SODIUM 40 MG IV SOLR
40.0000 mg | Freq: Two times a day (BID) | INTRAVENOUS | Status: DC
  Administered 2023-07-07 – 2023-07-10 (×6): 40 mg via INTRAVENOUS
  Filled 2023-07-07 (×6): qty 10

## 2023-07-07 MED ORDER — SODIUM CHLORIDE 0.9 % IV SOLN
1.0000 g | Freq: Two times a day (BID) | INTRAVENOUS | Status: DC
  Administered 2023-07-08 – 2023-07-09 (×3): 1 g via INTRAVENOUS
  Filled 2023-07-07 (×3): qty 20

## 2023-07-07 MED ORDER — LEVOTHYROXINE SODIUM 100 MCG/5ML IV SOLN
35.0000 ug | Freq: Every day | INTRAVENOUS | Status: DC
  Administered 2023-07-08: 35 ug via INTRAVENOUS
  Filled 2023-07-07: qty 5

## 2023-07-07 MED ORDER — ALBUMIN HUMAN 5 % IV SOLN
INTRAVENOUS | Status: AC
  Administered 2023-07-07: 12.5 g
  Filled 2023-07-07: qty 500

## 2023-07-07 MED ORDER — DOCUSATE SODIUM 50 MG/5ML PO LIQD
100.0000 mg | Freq: Two times a day (BID) | ORAL | Status: DC
  Administered 2023-07-07: 100 mg
  Filled 2023-07-07: qty 10

## 2023-07-07 MED ORDER — FENTANYL 2500MCG IN NS 250ML (10MCG/ML) PREMIX INFUSION
50.0000 ug/h | INTRAVENOUS | Status: DC
  Administered 2023-07-07: 50 ug/h via INTRAVENOUS
  Administered 2023-07-08: 100 ug/h via INTRAVENOUS
  Administered 2023-07-09: 50 ug/h via INTRAVENOUS
  Filled 2023-07-07 (×3): qty 250

## 2023-07-07 MED ORDER — FENTANYL CITRATE PF 50 MCG/ML IJ SOSY
PREFILLED_SYRINGE | INTRAMUSCULAR | Status: AC
  Filled 2023-07-07: qty 2

## 2023-07-07 MED ORDER — ROCURONIUM BROMIDE 10 MG/ML (PF) SYRINGE
PREFILLED_SYRINGE | INTRAVENOUS | Status: AC
  Filled 2023-07-07: qty 10

## 2023-07-07 MED ORDER — ORAL CARE MOUTH RINSE
15.0000 mL | OROMUCOSAL | Status: DC
  Administered 2023-07-07 – 2023-07-10 (×32): 15 mL via OROMUCOSAL

## 2023-07-07 MED ORDER — ALBUMIN HUMAN 5 % IV SOLN: 25.0000 g | Freq: Once | INTRAVENOUS | Status: AC

## 2023-07-07 MED ORDER — PHENYLEPHRINE 80 MCG/ML (10ML) SYRINGE FOR IV PUSH (FOR BLOOD PRESSURE SUPPORT)
PREFILLED_SYRINGE | INTRAVENOUS | Status: AC
  Administered 2023-07-07: 80 ug
  Filled 2023-07-07: qty 10

## 2023-07-07 MED ORDER — VANCOMYCIN HCL IN DEXTROSE 1-5 GM/200ML-% IV SOLN
1000.0000 mg | INTRAVENOUS | Status: DC
  Administered 2023-07-08 – 2023-07-09 (×2): 1000 mg via INTRAVENOUS
  Filled 2023-07-07 (×2): qty 200

## 2023-07-07 MED ORDER — FENTANYL CITRATE PF 50 MCG/ML IJ SOSY: 50.0000 ug | PREFILLED_SYRINGE | Freq: Once | INTRAMUSCULAR | Status: DC

## 2023-07-07 MED ORDER — FENTANYL BOLUS VIA INFUSION: 50.0000 ug | INTRAVENOUS | Status: DC | PRN

## 2023-07-07 MED ORDER — FENTANYL CITRATE PF 50 MCG/ML IJ SOSY
100.0000 ug | PREFILLED_SYRINGE | Freq: Once | INTRAMUSCULAR | Status: AC
  Administered 2023-07-07: 100 ug via INTRAVENOUS

## 2023-07-07 MED ORDER — NOREPINEPHRINE 4 MG/250ML-% IV SOLN
0.0000 ug/min | INTRAVENOUS | Status: DC
Start: 2023-07-07 — End: 2023-07-08
  Administered 2023-07-07: 12 ug/min via INTRAVENOUS
  Administered 2023-07-07 (×2): 26 ug/min via INTRAVENOUS
  Administered 2023-07-08: 33 ug/min via INTRAVENOUS
  Administered 2023-07-08: 37 ug/min via INTRAVENOUS
  Administered 2023-07-08: 39 ug/min via INTRAVENOUS
  Administered 2023-07-08: 26 ug/min via INTRAVENOUS
  Filled 2023-07-07 (×5): qty 250
  Filled 2023-07-07: qty 500

## 2023-07-07 MED ORDER — EPINEPHRINE 1 MG/10ML IJ SOSY
1.0000 mg | PREFILLED_SYRINGE | Freq: Once | INTRAMUSCULAR | Status: AC
  Administered 2023-07-07: 1 mg via INTRAVENOUS

## 2023-07-07 MED ORDER — OXYBUTYNIN CHLORIDE 5 MG PO TABS
5.0000 mg | ORAL_TABLET | Freq: Two times a day (BID) | ORAL | Status: DC
  Filled 2023-07-07: qty 1

## 2023-07-07 MED ORDER — KETAMINE HCL 50 MG/5ML IJ SOSY
PREFILLED_SYRINGE | INTRAMUSCULAR | Status: AC
  Filled 2023-07-07: qty 10

## 2023-07-07 MED ORDER — SODIUM CHLORIDE 0.9% FLUSH: 10.0000 mL | INTRAVENOUS | Status: DC | PRN

## 2023-07-07 MED ORDER — ORAL CARE MOUTH RINSE: 15.0000 mL | OROMUCOSAL | Status: DC | PRN

## 2023-07-07 MED ORDER — OXYCODONE HCL 5 MG PO TABS: 5.0000 mg | ORAL_TABLET | ORAL | Status: DC | PRN

## 2023-07-07 NOTE — Progress Notes (Signed)
   07/07/23 1405  Provider Notification  Date Provider Notified 07/07/23  Time Provider Notified 1406  Method of Notification Page  Notification Reason Change in status  Test performed and critical result acute bladder pain  Date Critical Result Received 07/07/23  Provider response See new orders  Date of Provider Response 07/07/23  Time of Provider Response 1407

## 2023-07-07 NOTE — Progress Notes (Signed)
 Nutrition Follow-up  DOCUMENTATION CODES:   Severe malnutrition in context of chronic illness  INTERVENTION:   Tube feeding via J-tube: Osmolite 1.5 at 40 ml/h Increase by 10 ml every 8 hours to goal rate of 55 ml/hr (1320 ml per day) Prosource TF20 60 ml BID  Provides 2140 kcal, 122 gm protein, 1003 ml free water daily  Recommend bowel regimen, discussed with RN   NUTRITION DIAGNOSIS:   Severe Malnutrition related to chronic illness (cancer) as evidenced by moderate muscle depletion, moderate fat depletion, energy intake < or equal to 75% for > or equal to 1 month, percent weight loss. Ongoing  GOAL:   Patient will meet greater than or equal to 90% of their needs Progressing with TF advancement  MONITOR:   TF tolerance  REASON FOR ASSESSMENT:   Consult Enteral/tube feeding initiation and management  ASSESSMENT:   Pt with PMH of HTN, bicsupid aortic valve, Vitamin D deficiency, and dx with pancreatic cancer 12/2022 and received 8 cycles of FOLFIRINOX prior to surgery. Pt now admitted for Whipple.  Pt discussed during ICU rounds and with RN. Spoke with RN, per RN MD ok with TF advancement per RD.  Noted emesis 3./23 however j-tube is providing nutrition beyond the gastric pylorus and therefore should tolerate.  Noted pt had smear BM 3/22 and 3/23.    3/19 s/p Whipple, J-tube placement, and liver biopsy 3/20 trickle TF started 3/21 started on Clear liquids, NG tube out 3/24 advancing TF to goal rate     Medications reviewed and include: 0-9 units novolog SSI TID, zofran, protonix 40 mEq KCl x 1   Labs reviewed:  Na 140 Phos 4.5 ->2.1 -> 2.1 -> 3.7 -> 3.8 Mag 1.5 -> 2.2 -> 2.0 -> 2.0 -> 1.9   16 F J-tube  NG tube out  1 RLQ JP: 325 ml 2 RLQ JP 235 ml  Current weight: 65 kg Admission weight: 60.3 kg   Diet Order:   Diet Order             Diet clear liquid Room service appropriate? Yes; Fluid consistency: Thin  Diet effective now                    EDUCATION NEEDS:   Education needs have been addressed  Skin:  Skin Assessment: Reviewed RN Assessment (abd incision)  Last BM:  3/23 smear  Height:   Ht Readings from Last 1 Encounters:  07/02/23 5\' 8"  (1.727 m)    Weight:   Wt Readings from Last 1 Encounters:  07/07/23 65 kg    Ideal Body Weight:  70 kg  BMI:  Body mass index is 21.79 kg/m.  Estimated Nutritional Needs:   Kcal:  2000-2200  Protein:  100-120 grams  Fluid:  >2 L/day  Cammy Copa., RD, LDN, CNSC See AMiON for contact information

## 2023-07-07 NOTE — Consult Note (Addendum)
 NAME:  Randy Allen, MRN:  [id number], DOB:  Aug 14, 1960, LOS: 5 ADMISSION DATE:  07/02/2023, CONSULTATION DATE:  07/07/23 REFERRING MD:  CCS , CHIEF COMPLAINT:  encephalopathy   History of Present Illness:  63 year old man status post Whipple procedure 3/19 who were seen for clinical decompensation with encephalopathy tachypnea hypotension concern for septic versus Magick shock.  At time of my arrival patient is encephalopathic.  Very tachypneic.  Tachycardic to 140s.  Hypotensive.  Multiple pushes of phenylephrine.  Blood pressures improved.  Able to tolerate etomidate for intubation.  Good view.  Placed without difficulty.  Somewhat fresh blood noted in the esophagus during intubation.  Vasopressor dose escalated.  Central line arterial line placed.  Port was accessed.  Repeat labs sent.  Concern for septic shock and reactive shock.  Possible severe acidosis, acidemia given clinical picture.  Discussed with surgery at bedside.  Plan for repeat imaging then next steps pending imaging findings.  Pertinent  Medical History  Pancreatic cancer status post Whipple 07/02/2023  Significant Hospital Events: Including procedures, antibiotic start and stop dates in addition to other pertinent events     Interim History / Subjective:  Appears tachypneic throughout the day.  Called to the bedside with concern for hypotension developed encephalopathy left intubation.  Multiple lines placed.  Repeat labs pending.  Concern for occult sepsis hemorrhagic shock.  Recent surgery concern for source of bleeding.  Pancreatic cancer status post Whipple  Objective   Blood pressure (!) 101/32, pulse 62, temperature (!) 96.7 F (35.9 C), temperature source Axillary, resp. rate (!) 26, height 5\' 8"  (1.727 m), weight 65 kg, SpO2 100%.    Vent Mode: PRVC FiO2 (%):  [100 %] 100 % Set Rate:  [26 bmp] 26 bmp Vt Set:  [550 mL] 550 mL PEEP:  [14 cmH20] 14 cmH20   Intake/Output Summary (Last 24 hours) at 07/07/2023  1744 Last data filed at 07/07/2023 1741 Gross per 24 hour  Intake 3474.08 ml  Output 4555 ml  Net -1080.92 ml   Filed Weights   07/05/23 0404 07/06/23 0458 07/07/23 0428  Weight: 62.5 kg 64.1 kg 65 kg    Examination: General: Lying in bed encephalopathic HENT: Atraumatic normocephalic Lungs: Tachypneic Cardiovascular: Tachycardic regular rhythm Abdomen: Nondistended, difficult to assess for tenderness given encephalopathy, healing large midline surgical scar with staples in place, murky bloody drainage from GJ tube insertion, repeat bloody drainage similar-appearing in JP bulb Extremities: No extremities Neuro: Encephalopathic does not follow commands  Resolved Hospital Problem list     Assessment & Plan:   Acute metabolic encephalopathy: Concern for severe acidosis related to sepsis versus hemorrhage versus a combination of the both.  Intubated due to encephalopathy. -- Fentanyl gtt., midazolam as needed, fentanyl as needed for ventilator synchrony -- Wean medications as appropriate once more stable  Ventilator dependence due to encephalopathy, acute on chronic hypoxemic respiratory failure: Increased respiratory rate, encephalopathy suspect driving things via acidosis as described above, labs not back yet.  Preceding oxygen requirement noted. -- Repeat chest x-ray now extubated -- PRVC, VAP bundle, stress ulcer prophylaxis -- Antibiotics as below  Septic shock with presumed intra-abdominal source status post recent surgical procedure, Whipple: Murky dark fluid noted coming from JP drain as well as oozing from around site of GJ tube insertion. -- Continue vancomycin, expand coverage to include meropenem (cefepime started earlier in day 3/24) -- CT of abdomen per surgery, appreciate assistance -- MAP greater than 65, if increasing norepinephrine doses will need to add  vasopressin  Concern for hemorrhagic shock: With murky bloody appearing JP drainage appears old.  Blood noted  in the esophagus during intubation. -- IV PPI -- Repeat labs -- DIC panel --Transfuse per surgery or Hgb of 7 -- CTA abdomen pelvis GI bleed  Bilateral pleural effusions left greater than right: Enlarged on left appears more loculated. --consider chest tube versus thoracentesis once stabilized and imaging done.   [facility] (right click and "Reselect all SmartList Selections" daily)   Per primary  Labs   CBC: Recent Labs  Lab 07/04/23 0418 07/04/23 1221 07/05/23 0353 07/05/23 1530 07/06/23 0823 07/07/23 0646 07/07/23 0738  WBC 19.3*  --  17.0* 19.2* 20.4*  --  31.1*  HGB 6.0*   < > 6.5* 9.5* 8.8* 9.5* 9.4*  HCT 18.1*   < > 19.4* 27.3* 25.4* 28.0* 28.6*  MCV 100.6*  --  97.0 93.5 94.4  --  97.6  PLT 103*  --  99* 100* 113*  --  145*   < > = values in this interval not displayed.    Basic Metabolic Panel: Recent Labs  Lab 07/03/23 0519 07/04/23 0418 07/05/23 0353 07/06/23 0823 07/07/23 0646 07/07/23 0738  NA 132* 133* 138 139 140 140  K 4.2 3.9 3.8 3.6 3.7 3.6  CL 106 104 107 107  --  107  CO2 21* 23 24 24   --  24  GLUCOSE 155* 114* 135* 150*  --  161*  BUN 18 17 13 21   --  24*  CREATININE 0.89 0.71 0.65 0.67  --  0.75  CALCIUM 7.7* 7.9* 8.4* 8.5*  --  8.7*  MG 1.5* 2.2 2.0 2.0  --  1.9  PHOS 4.5 2.1* 2.1* 3.7  --  3.8   GFR: Estimated Creatinine Clearance: 88 mL/min (by C-G formula based on SCr of 0.75 mg/dL). Recent Labs  Lab 07/05/23 0353 07/05/23 1530 07/06/23 0823 07/07/23 0738  WBC 17.0* 19.2* 20.4* 31.1*    Liver Function Tests: Recent Labs  Lab 07/03/23 0519 07/04/23 0418 07/05/23 0353 07/06/23 0823 07/07/23 0738  AST 86* 45* 27 28 125*  ALT 50* 27 19 23  64*  ALKPHOS 236* 145* 114 147* 369*  BILITOT 0.7 0.6 1.0 1.1 4.3*  PROT 4.4* 4.8* 4.9* 5.3* 5.5*  ALBUMIN 2.5* 3.2* 3.3* 3.1* 3.0*   No results for input(s): "LIPASE", "AMYLASE" in the last 168 hours. No results for input(s): "AMMONIA" in the last 168 hours.  ABG     Component Value Date/Time   PHART 7.483 (H) 07/07/2023 0646   PCO2ART 32.1 07/07/2023 0646   PO2ART 69 (L) 07/07/2023 0646   HCO3 24.1 07/07/2023 0646   TCO2 25 07/07/2023 0646   ACIDBASEDEF 2.0 07/02/2023 1432   O2SAT 95 07/07/2023 0646     Coagulation Profile: Recent Labs  Lab 07/03/23 0519 07/04/23 0418 07/05/23 1132  INR 1.3* 1.4* 1.3*    Cardiac Enzymes: No results for input(s): "CKTOTAL", "CKMB", "CKMBINDEX", "TROPONINI" in the last 168 hours.  HbA1C: Hgb A1c MFr Bld  Date/Time Value Ref Range Status  07/03/2023 12:54 PM 5.1 4.8 - 5.6 % Final    Comment:    (NOTE) Pre diabetes:          5.7%-6.4%  Diabetes:              >6.4%  Glycemic control for   <7.0% adults with diabetes   07/17/2022 10:07 AM 5.5 <5.7 % of total Hgb Final    Comment:    For  the purpose of screening for the presence of diabetes: . <5.7%       Consistent with the absence of diabetes 5.7-6.4%    Consistent with increased risk for diabetes             (prediabetes) > or =6.5%  Consistent with diabetes . This assay result is consistent with a decreased risk of diabetes. . Currently, no consensus exists regarding use of hemoglobin A1c for diagnosis of diabetes in children. . According to American Diabetes Association (ADA) guidelines, hemoglobin A1c <7.0% represents optimal control in non-pregnant diabetic patients. Different metrics may apply to specific patient populations.  Standards of Medical Care in Diabetes(ADA). .     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   Surgical History:   Past Surgical History:  Procedure Laterality Date   BILIARY BRUSHING  01/18/2023   Procedure: BILIARY BRUSHING;  Surgeon: Hilarie Fredrickson, MD;  Location: [facility] ENDOSCOPY;  Service: Gastroenterology;;   BILIARY STENT PLACEMENT  01/18/2023   Procedure: BILIARY STENT PLACEMENT;  Surgeon: Hilarie Fredrickson, MD;  Location: [facility] ENDOSCOPY;  Service: Gastroenterology;;   BIOPSY  02/06/2023   Procedure: BIOPSY;  Surgeon: Lemar Lofty., MD;  Location: [facility] ENDOSCOPY;  Service: Gastroenterology;;   COLONOSCOPY WITH PROPOFOL  10/21/2013   ENDOSCOPIC RETROGRADE CHOLANGIOPANCREATOGRAPHY (ERCP) WITH PROPOFOL N/A 01/18/2023   Procedure: ENDOSCOPIC RETROGRADE CHOLANGIOPANCREATOGRAPHY (ERCP) WITH PROPOFOL;  Surgeon: Hilarie Fredrickson, MD;  Location: [facility] ENDOSCOPY;  Service: Gastroenterology;  Laterality: N/A;   ESOPHAGOGASTRODUODENOSCOPY N/A 02/06/2023   Procedure: ESOPHAGOGASTRODUODENOSCOPY (EGD);  Surgeon: Lemar Lofty., MD;  Location: [facility] ENDOSCOPY;  Service: Gastroenterology;  Laterality: N/A;   EUS N/A 02/06/2023   Procedure: UPPER ENDOSCOPIC ULTRASOUND (EUS) RADIAL;  Surgeon: Lemar Lofty., MD;  Location: [facility] ENDOSCOPY;  Service: Gastroenterology;  Laterality: N/A;   HYDROCELE EXCISION Left 2005   @[email or web address]   by dr [email or web address]. davis   JEJUNOSTOMY N/A 07/02/2023   Procedure: Rance Muir TUBE;  Surgeon: [name], MD;  Location: [facility] OR;  Service: General;  Laterality: N/A;   LAPAROSCOPY N/A 07/02/2023   Procedure: LAPAROSCOPY,  DIAGNOSTIC;  Surgeon: [name], MD;  Location: [facility] OR;  Service: General;  Laterality: N/A;  GENERAL AND EPIDURAL 420   MASS EXCISION Right 04/26/2015   Procedure: EXCISION MASS NECK AND RIGHT SHOULDER ;  Surgeon: Luretha Murphy, MD;  Location: [facility];  Service: General;  Laterality: Right;   PORTACATH PLACEMENT N/A 02/12/2023   Procedure: INSERTION PORT-A-CATH;  Surgeon: [name], MD;  Location[facility];  Service: General;  Laterality: N/A;   SPHINCTEROTOMY  01/18/2023   Procedure: SPHINCTEROTOMY;  Surgeon: Hilarie Fredrickson, MD;  Location: [facility] ENDOSCOPY;  Service: Gastroenterology;;   STRABISMUS SURGERY Right 1966   WHIPPLE PROCEDURE N/A 07/02/2023   Procedure: WHIPPLE PROCEDURE;  Surgeon: [name], MD;  Location: [facility] OR;  Service: General;  Laterality: N/A;     Social History:   reports that he has never smoked. He has never used smokeless tobacco. He reports that he does not currently use alcohol. He reports that he does not use drugs.   Family History:  His family history includes Arthritis in his father and mother; Cancer in his father; Colon cancer (age of onset: 52) in his maternal uncle; Depression in his mother; Heart attack in his father; Migraines in his mother; Ulcers in his father.   Allergies Allergies  Allergen Reactions   Zoloft [Sertraline Hcl] Other (See Comments)    HYPERACTIVE   Quinolones Other (See Comments)    Aortic aneurysm--Did not feel well     Home Medications  Prior to Admission medications   Medication Sig Start Date End Date Taking? Authorizing Provider  atorvastatin (LIPITOR) 40 MG tablet Take  1 tablet  Daily  for Cholesterol Patient taking differently: Take 40 mg by mouth every evening. 02/26/23  Yes Cranford, Archie Patten, NP  Cholecalciferol (VITAMIN D3) 125 MCG (5000 UT) TABS Take 5,000 Units by mouth every evening.   Yes [provider]  enalapril (VASOTEC) 10 MG tablet Take  1 to 2  tablet(s)   Daily for BP                                                          /                                                                   TAKE                                         BY                                                 MOUTH Patient taking differently: Take 10 mg by mouth every evening. 04/02/23  Yes Lucky Cowboy, MD  levothyroxine (SYNTHROID) 75 MCG tablet Take  1 tablet  Daily  on an empty stomach with only water for 30 minutes & no Antacid meds, Calcium or Magnesium for 4 hours & avoid Biotin 05/12/23  Yes Cranford, Tonya, NP  lidocaine-prilocaine (EMLA) cream Apply 1 Application topically as needed. Apply 1 tablespoon to port site 1-2 hours prior to stick and cover with Press-and-Seal to numb site. MAY START 14 DAYS AFTER PORT IS PLACED 02/12/23  Yes Ladene Artist, MD  omeprazole (PRILOSEC) 40 MG capsule Take 1 capsule (40 mg total) by mouth 2 (two) times daily before a meal. Twice daily for 59-months then may go to once daily Patient taking differently: Take 40 mg by mouth in the morning. 02/06/23  Yes Mansouraty, Netty Starring., MD  ondansetron (ZOFRAN) 8 MG tablet Take 1 tablet (8 mg total)  by mouth every 8 (eight) hours as needed. Start 72 hours after chemotherapy treatment date 04/15/23  Yes Ladene Artist, MD  oxyCODONE (OXY IR/ROXICODONE) 5 MG immediate release tablet Take 1 tablet (5 mg total) by mouth every 4 (four) hours as needed for severe pain (pain score 7-10). 05/13/23  Yes Rana Snare, NP  potassium chloride (KLOR-CON M) 10 MEQ tablet Take 1 tablet (10 mEq total) by mouth daily. Patient taking differently: Take 10 mEq by mouth every evening. 04/29/23  Yes Rana Snare, NP  prochlorperazine (COMPAZINE) 10 MG tablet Take 1 tablet (10 mg total) by mouth every 6 (six) hours as needed for nausea. 04/01/23  Yes Rana Snare, NP  Blood Glucose Monitoring Suppl DEVI 1 each by Does not apply route in the morning, at noon, and at bedtime. May substitute to any  manufacturer covered by patient's insurance. 01/20/23   Adela Glimpse, NP  glucose blood test strip Test blood sugar once daily 02/26/23   Adela Glimpse, NP     Critical care time:     CRITICAL CARE Performed by: Karren Burly   Total critical care time: 40 minutes  Critical care time was exclusive of separately billable procedures and treating other patients.  Critical care was necessary to treat or prevent imminent or life-threatening deterioration.  Critical care was time spent personally by me on the following activities: development of treatment plan with patient and/or surrogate as well as nursing, discussions with consultants, evaluation of patient's response to treatment, examination of patient, obtaining history from patient or surrogate, ordering and performing treatments and interventions, ordering and review of laboratory studies, ordering and review of radiographic studies, pulse oximetry and re-evaluation of patient's condition.  Karren Burly, MD  See Loretha Stapler

## 2023-07-07 NOTE — Plan of Care (Signed)

## 2023-07-07 NOTE — Progress Notes (Signed)
 5 Days Post-Op    Subjective: Patient is still spitting up some blood.  He did not require any blood this morning.  He is still feeling nauseated and when he tried to have some grape juice this morning, he threw that up.  He is passing gas and having bowel movements.  He was having some shortness of breath this morning.  He feels better breathing out of bed in the chair than he did when he was in the bed.  Objective: Vital signs in last 24 hours: Temp:  [97.8 F (36.6 C)-98.3 F (36.8 C)] 98.2 F (36.8 C) (03/24 0800) Pulse Rate:  [82-119] 119 (03/24 0700) Resp:  [14-32] 19 (03/24 0800) BP: (109-154)/(66-118) 133/96 (03/24 0800) SpO2:  [91 %-96 %] 96 % (03/24 0800) Weight:  [65 kg] 65 kg (03/24 0428) Last BM Date : 07/06/23  Intake/Output from previous day: 03/23 0701 - 03/24 0700 In: 3370.7 [P.O.:1350; I.V.:1307.4; NG/GT:420; IV Piggyback:126] Out: 2890 [Urine:580; Emesis/NG output:1750; Drains:560] Intake/Output this shift: Total I/O In: 411.8 [I.V.:299.8; Other:32; NG/GT:80] Out: -   Gen:  alert and oriented Resp:  sl tachypneic CV regular, tachycardic Abd:  soft, non distended, approp tender. One drain bloody, the other drain is serosang.   Lab Results: CBC  Recent Labs    07/06/23 0823 07/07/23 0646 07/07/23 0738  WBC 20.4*  --  31.1*  HGB 8.8* 9.5* 9.4*  HCT 25.4* 28.0* 28.6*  PLT 113*  --  145*   BMET Recent Labs    07/06/23 0823 07/07/23 0646 07/07/23 0738  NA 139 140 140  K 3.6 3.7 3.6  CL 107  --  107  CO2 24  --  24  GLUCOSE 150*  --  161*  BUN 21  --  24*  CREATININE 0.67  --  0.75  CALCIUM 8.5*  --  8.7*   PT/INR Recent Labs    07/05/23 1132  LABPROT 16.7*  INR 1.3*   ABG Recent Labs    07/07/23 0646  PHART 7.483*  HCO3 24.1    Studies/Results: DG Chest Portable 1 View Result Date: 07/07/2023 CLINICAL DATA:  Shortness of breath EXAM: PORTABLE CHEST 1 VIEW COMPARISON:  02/12/2023 FINDINGS: Left chest wall port a catheter  noted with tip at the distal SVC. Stable cardiomediastinal contours. New small bilateral pleural effusions are identified, left greater than right. Decreased aeration to the left lower lobe may reflect atelectasis or airspace disease. Visualized osseous structures are unremarkable. IMPRESSION: 1. New small bilateral pleural effusions, left greater than right. 2. Decreased aeration to the left lower lobe may reflect atelectasis or airspace disease. Electronically Signed   By: Signa Kell M.D.   On: 07/07/2023 07:21    Anti-infectives: Anti-infectives (From admission, onward)    Start     Dose/Rate Route Frequency Ordered Stop   07/02/23 2200  ceFAZolin (ANCEF) IVPB 2g/100 mL premix        2 g 200 mL/hr over 30 Minutes Intravenous Every 8 hours 07/02/23 1814 07/02/23 2240   07/02/23 0730  ceFAZolin (ANCEF) IVPB 2g/100 mL premix        2 g 200 mL/hr over 30 Minutes Intravenous On call to O.R. 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 Starting pre op HCT 24.5.  - having some anastamotic bleeding/bloody emesis. Holding SQ heparin.  FEN -  continue IVF.   TF at 20 - advance tube feeds to goal.   Pain control - oxy, morphine PRN - oral tylenol and gabapentin  Hyperglycemia  - SSI.  - CBGs OK  ID - WBCs up to 31, start antibiotics. Will tx for pneumonia give SOB and CXR findings.  Send jp drains for amylase.  FEN - Bariatric clears. Hold of advancing due to vomiting.  - BID protonix - continue foley for urinary output monitoring.  - LFTs  up today. Recheck tomorrow.   VTE ppx- TEDS, sq heparin- hold again today given above  Dispo:  therapies, stay in ICU for tachycardia, SOB   LOS: 5 days   Maudry Diego, MD, FACS, FSSO Surgical Oncology and General Surgery [facility],  [place] [phone] for weekday/non holidays Check [email or web address] for coverage night/weekend/holidays   07/07/2023

## 2023-07-07 NOTE — Procedures (Signed)
 Central Venous Catheter Insertion Procedure Note  Randy Allen  [id number]  1960-11-27  Date:07/07/23  Time:5:39 PM   Provider Performing:Goro Wenrick Celine Mans   Procedure: Insertion of Non-tunneled Central Venous [phone]) with US guidance (56213)   Indication(s) Medication administration and Difficult access  Consent Risks of the procedure as well as the alternatives and risks of each were explained to the patient and/or caregiver.  Consent for the procedure was obtained and is signed in the bedside chart  Anesthesia Topical only with 1% lidocaine   Timeout Verified patient identification, verified procedure, site/side was marked, verified correct patient position, special equipment/implants available, medications/allergies/relevant history reviewed, required imaging and test results available.  Sterile Technique Maximal sterile technique including full sterile barrier drape, hand hygiene, sterile gown, sterile gloves, mask, hair covering, sterile ultrasound probe cover (if used).  Procedure Description Area of catheter insertion was cleaned with chlorhexidine and draped in sterile fashion.  With real-time ultrasound guidance a central venous catheter was placed into the right femoral vein. Nonpulsatile blood flow and easy flushing noted in all ports.  The catheter was sutured in place and sterile dressing applied.  Complications/Tolerance None; patient tolerated the procedure well. Chest X-ray is ordered to verify placement for internal jugular or subclavian cannulation.   Chest x-ray is not ordered for femoral cannulation.  EBL Minimal  Specimen(s) None   Rutherford Guys, PA - C [place] [facility] For pager details, please see AMION or use Epic chat  After 1900, please call [facility] for cross coverage needs 07/07/2023, 5:39 PM

## 2023-07-07 NOTE — Anesthesia Post-op Follow-up Note (Signed)
  Anesthesia Pain Follow-up Note  Patient: Randy Allen  Day #: 5  Date of Follow-up: 07/07/2023 Time: 9:30 AM  Last Vitals:  Vitals:   07/07/23 0700 07/07/23 0800  BP: (!) 135/101 (!) 133/96  Pulse: (!) 119   Resp: (!) 32 19  Temp:  36.8 C  SpO2: 94% 96%    Level of Consciousness: alert  Pain: mild   Side Effects:None  Catheter Site Exam:clean, dry, no drainage  Epidural / Intrathecal (From admission, onward)    Start     Dose/Rate Route Frequency Ordered Stop   07/02/23 2245  ropivacaine (PF) 2 mg/mL (0.2%) (NAROPIN) injection        8 mL/hr 8 mL/hr  Epidural Continuous 07/02/23 2158          Plan: Catheter removed/tip intact at surgeon's request and D/C from anesthesia care at surgeon's request  Beryle Lathe

## 2023-07-07 NOTE — Progress Notes (Signed)
 Occupational Therapy Treatment Patient Details Name: Randy Allen MRN: [id number] DOB: Jun 16, 1960 Today's Date: 07/07/2023   History of present illness 63 y.o. male adm 3/19 for pancreatic cancer follow-up.  Patient had been having pain in the abdomen for several months and weight loss, MRI showed a pancreatic head mass.  S/p Whipple procedure.  PMH includes:Aneurysm,  Chronic kidney disease, History of cancer, Hyperlipidemia, Hypertension, Thyroid disease.   OT comments  Patient with continued good mobility, a little self limiting this morning, but moving and performing light ADL in stand with generalized supervision and cues for safety.  OT will continue efforts in the acute setting to address deficits, as no post acute OT is anticipated.  HR and RR elevated, discussed with RN.        If plan is discharge home, recommend the following:  Assist for transportation   Equipment Recommendations  None recommended by OT    Recommendations for Other Services      Precautions / Restrictions Precautions Precautions: Fall Recall of Precautions/Restrictions: Intact Precaution/Restrictions Comments: 2 JP Drains, Foley, epidural access, feeding tube. Restrictions Weight Bearing Restrictions Per Provider Order: No       Mobility Bed Mobility Overal bed mobility: Needs Assistance Bed Mobility: Supine to Sit     Supine to sit: Supervision       Patient Response: Cooperative  Transfers Overall transfer level: Needs assistance Equipment used: None Transfers: Sit to/from Stand, Bed to chair/wheelchair/BSC Sit to Stand: Supervision     Step pivot transfers: Supervision           Balance Overall balance assessment: Needs assistance Sitting-balance support: Feet supported Sitting balance-Leahy Scale: Normal     Standing balance support: No upper extremity supported Standing balance-Leahy Scale: Good                             ADL either performed or assessed  with clinical judgement   ADL               Lower Body Bathing: Supervison/ safety;Sit to/from stand       Lower Body Dressing: Supervision/safety;Sit to/from stand   Toilet Transfer: Supervision/safety;Stand-pivot;BSC/3in1                  Extremity/Trunk Assessment Upper Extremity Assessment Upper Extremity Assessment: Overall WFL for tasks assessed   Lower Extremity Assessment Lower Extremity Assessment: Defer to PT evaluation   Cervical / Trunk Assessment Cervical / Trunk Assessment: Other exceptions Cervical / Trunk Exceptions: abdominal surgery, x2 JP drains and epidural present    Vision Patient Visual Report: No change from baseline     Perception Perception Perception: Not tested   Praxis Praxis Praxis: Not tested   Communication Communication Communication: No apparent difficulties   Cognition Arousal: Alert Behavior During Therapy: Impulsive Cognition: No apparent impairments                               Following commands: Intact        Cueing   Cueing Techniques: Verbal cues  Exercises      Shoulder Instructions       General Comments      Pertinent Vitals/ Pain       Pain Assessment Pain Assessment: No/denies pain Pain Intervention(s): Monitored during session  Frequency  Min 1X/week        Progress Toward Goals  OT Goals(current goals can now be found in the care plan section)  Progress towards OT goals: Progressing toward goals  Acute Rehab OT Goals OT Goal Formulation: With patient Time For Goal Achievement: 07/17/23 Potential to Achieve Goals: Good  Plan      Co-evaluation                 AM-PAC OT "6 Clicks" Daily Activity     Outcome Measure   Help from another person eating meals?: None Help from another person taking care of personal grooming?: A Little Help from another person toileting, which  includes using toliet, bedpan, or urinal?: A Little Help from another person bathing (including washing, rinsing, drying)?: A Little Help from another person to put on and taking off regular upper body clothing?: A Little Help from another person to put on and taking off regular lower body clothing?: A Little 6 Click Score: 19    End of Session    OT Visit Diagnosis: Muscle weakness (generalized) (M62.81)   Activity Tolerance Patient tolerated treatment well   Patient Left in chair;with call bell/phone within reach   Nurse Communication Mobility status        Time: 1610-9604 OT Time Calculation (min): 21 min  Charges: OT General Charges $OT Visit: 1 Visit OT Treatments $Self Care/Home Management : 8-22 mins  07/07/2023  RP, OTR/L  Acute Rehabilitation Services  Office:  [phone]   Suzanna Obey 07/07/2023, 8:43 AM

## 2023-07-07 NOTE — Progress Notes (Signed)
 Physical Therapy Treatment Patient Details Name: Randy Allen MRN: [id number] DOB: 1961/02/03 Today's Date: 07/07/2023   History of Present Illness 63 y.o. male adm 3/19 for pancreatic cancer follow-up.  Patient had been having pain in the abdomen for several months and weight loss, MRI showed a pancreatic head mass.  S/p Whipple procedure.  PMH includes:Aneurysm,  Chronic kidney disease, History of cancer, Hyperlipidemia, Hypertension, Thyroid disease.    PT Comments  The pt was agreeable to session, eager to get back to bed. He reports increased pain since removal of epidural earlier this morning. The pt was able to complete sit-stand with supervision but ambulation distance remains limited by increased pain. The pt was then educated on LE activation and ROM exercises to maintain mobility while limited to bed/chair. Pt with good technique, cued to complete within pain-free ROM. Will continue to follow acutely to progress ambulation distance as tolerated.    If plan is discharge home, recommend the following: A little help with walking and/or transfers;Assistance with cooking/housework;Help with stairs or ramp for entrance;Assist for transportation   Can travel by private vehicle        Equipment Recommendations  None recommended by PT    Recommendations for Other Services       Precautions / Restrictions Precautions Precautions: Fall Recall of Precautions/Restrictions: Intact Precaution/Restrictions Comments: 2 JP Drains, PCA, Foley Restrictions Weight Bearing Restrictions Per Provider Order: No     Mobility  Bed Mobility Overal bed mobility: Needs Assistance Bed Mobility: Rolling, Sit to Sidelying Rolling: Supervision       Sit to sidelying: Supervision General bed mobility comments: assist for lines, pt with slow but safe movements. cued for abdominal precautions    Transfers Overall transfer level: Needs assistance Equipment used: None Transfers: Sit to/from Stand,  Bed to chair/wheelchair/BSC Sit to Stand: Supervision   Step pivot transfers: Supervision       General transfer comment: assist for line management only    Ambulation/Gait Ambulation/Gait assistance: Supervision Gait Distance (Feet): 3 Feet Assistive device: None Gait Pattern/deviations: Step-to pattern Gait velocity: appropriately slow for line management, limited by increased pain today Gait velocity interpretation: <1.31 ft/sec, indicative of household ambulator   General Gait Details: limited by lines and NG tube, no UE support and pt largely stable      Balance Overall balance assessment: Needs assistance Sitting-balance support: Feet supported Sitting balance-Leahy Scale: Normal     Standing balance support: No upper extremity supported Standing balance-Leahy Scale: Good Standing balance comment: with standing and small steps                            Communication Communication Communication: No apparent difficulties  Cognition Arousal: Alert Behavior During Therapy: WFL for tasks assessed/performed   PT - Cognitive impairments: No apparent impairments                         Following commands: Intact      Cueing Cueing Techniques: Verbal cues  Exercises General Exercises - Lower Extremity Ankle Circles/Pumps: AROM, Both, 10 reps Short Arc Quad: AROM, Both, 10 reps Heel Slides: AROM, Both, 5 reps Hip ABduction/ADduction: AROM, Both, 10 reps    General Comments General comments (skin integrity, edema, etc.): HR to 130s max, BP stable      Pertinent Vitals/Pain Pain Assessment Pain Assessment: 0-10 Pain Score: 6  Pain Location: abdomen, back  PT Goals (current goals can now be found in the care plan section) Acute Rehab PT Goals Patient Stated Goal: return home and to independence PT Goal Formulation: With patient Time For Goal Achievement: 07/17/23 Potential to Achieve Goals: Good Progress towards PT goals:  Progressing toward goals    Frequency    Min 2X/week       AM-PAC PT "6 Clicks" Mobility   Outcome Measure  Help needed turning from your back to your side while in a flat bed without using bedrails?: A Little Help needed moving from lying on your back to sitting on the side of a flat bed without using bedrails?: A Little Help needed moving to and from a bed to a chair (including a wheelchair)?: A Little Help needed standing up from a chair using your arms (e.g., wheelchair or bedside chair)?: A Little Help needed to walk in hospital room?: Total (<20 ft) Help needed climbing 3-5 steps with a railing? : Total 6 Click Score: 14    End of Session   Activity Tolerance: Patient tolerated treatment well Patient left: with call bell/phone within reach;in bed;with nursing/sitter in room Nurse Communication: Mobility status PT Visit Diagnosis: Unsteadiness on feet (R26.81);Other abnormalities of gait and mobility (R26.89);Muscle weakness (generalized) (M62.81);Pain Pain - part of body:  (abdomen)     Time: 0939-1000 PT Time Calculation (min) (ACUTE ONLY): 21 min  Charges:    $Therapeutic Activity: 8-22 mins PT General Charges $$ ACUTE PT VISIT: 1 Visit                     Vickki Muff, PT, DPT   Acute Rehabilitation Department Office [phone] Secure Chat Communication Preferred   Ronnie Derby 07/07/2023, 1:21 PM

## 2023-07-07 NOTE — Progress Notes (Signed)
 eLink Physician-Brief Progress Note Patient Name: Randy Allen DOB: Jun 12, 1960 MRN: [id number]   Date of Service  07/07/2023  HPI/Events of Note  CT - Moderate-sized subcapsular perisplenic fluid collection with mild wall enhancement. There is a small amount of air within this collection as it abuts the upper greater curvature of the stomach. Can not exclude contained perforation of the stomach at this level.  eICU Interventions  Surgical team aware, no obvious active GI bleeding.  Adequate antibiotics        [name] 07/07/2023, 10:56 PM

## 2023-07-07 NOTE — Progress Notes (Addendum)
 Pharmacy Antibiotic Note  Randy Allen is a 63 y.o. male admitted on 07/02/2023 with pneumonia.  Pharmacy has been consulted for vancomycin and cefepime dosing. Now changing cefepime to meropenem for intra-abd coverage.  Plan: Meropenem 1gm IV q8h Vancomycin 1250mg  IV x1 loading dose Vancomycin 1000mg  IV every 12 hours (eAUC 525, Scr 0.8, eCmin 14, IBW, Vd 0.7) Goal AUC 400-600 Follow CBC, renal function, fever curve, signs of clinical improvement Follow up cultures to deescalate  ADDENDUM (2030) SCr worsened to 1.46, est CrCl 48 ml/min Adjust Meropenem to 1gm IV q12h Change Vancomycin to 1000mg  IV q24h (eAUC 480, SCr 1.46) F/u SCr closely and prn vanc level  Height: 5\' 8"  (172.7 cm) Weight: 65 kg (143 lb 4.8 oz) IBW/kg (Calculated) : 68.4  Temp (24hrs), Avg:97.9 F (36.6 C), Min:96.7 F (35.9 C), Max:98.7 F (37.1 C)  Recent Labs  Lab 07/03/23 0519 07/04/23 0418 07/05/23 0353 07/05/23 1530 07/06/23 0823 07/07/23 0738 07/07/23 1717  WBC 16.5* 19.3* 17.0* 19.2* 20.4* 31.1* 10.5  CREATININE 0.89 0.71 0.65  --  0.67 0.75  --     Estimated Creatinine Clearance: 88 mL/min (by C-G formula based on SCr of 0.75 mg/dL).    Allergies  Allergen Reactions   Zoloft [Sertraline Hcl] Other (See Comments)    HYPERACTIVE   Quinolones Other (See Comments)    Aortic aneurysm--Did not feel well    Antimicrobials this admission: 3/24 Vancomycin  >>  3/24 cefepime  x1 3/24 Meropenem >>  Microbiology results: 3/24 MRSA PCR: negative  Thank you for allowing pharmacy to be a part of this patient's care.  Christoper Fabian, PharmD, BCPS Please see amion for complete clinical pharmacist phone list 07/07/2023 5:56 PM

## 2023-07-07 NOTE — Progress Notes (Signed)
 Asked to come see patient for some hypotension, tachycardia, tachypnea.  Upon arrival, patient was lethargic, but would arouse and attempt to carry on a conversation.  He could answer all orientation questions appropriately, but did randomly talk about shooting basketball or something from work.  Ihs BP upon arrival was 101/37.  It was noted that he was draining dark, murky, foul-smelling, old blood from around his J-tube.  His JP 2 was full of this same fluid.  I emptied this and recharged the drain.  This immediately with pressure refilled.  Ultimately, about 2L of this fluid has been removed throughout the entirety of my presence with the patient.  CCM was consulted due to tachypnea, poor O2 sats at 88% on 6L [place], and need for intubation.  Recheck of the BP showed 50/27.  Patient at this point had become completely lethargic and essentially unresponsive.  He was started on levophed and proceeded with intubation.  Patient is pale in appearance.  Minimal access was present and IO access was acquired until CL placement could be obtained after intubation.  Blood has been drawn and sent for.  500cc of albumin were initially ordered.  2 units of pRBCs as well as 2FFP have been ordered given the patient has been spitting up blood throughout the day and all the old blood that was evacuated from the abdominal cavity through his JP drain.  His BP has improved some with the addition of levo.  A CTA of his abdomen has been ordered for when he is stable to go down.  His wife and her sister were initially at bedside.  They have been updated throughout this process.  I did confirm that if the patient were to arrest that he is FULL CODE.   At this time, we will proceed with CTA of A/P when stable, await repeat labs, cont to monitor drain output, etc.  Greatly appreciate CCM assistance with this patient.  Numerous times throughout this incident, I did personally speak to Dr. Donell Beers for guidance and further recommendations.  She  is abreast of the situation.  Letha Cape 6:03 PM 07/07/2023

## 2023-07-07 NOTE — Procedures (Signed)
 Intubation Procedure Note  Randy Allen  [id number]  [birth year]  Date:07/07/23  Time:5:38 PM   Provider Performing:Terrence Wishon Celine Mans    Procedure: Intubation (31500)  Indication(s) Respiratory Failure  Consent Risks of the procedure as well as the alternatives and risks of each were explained to the patient and/or caregiver.  Consent for the procedure was obtained and is signed in the bedside chart   Anesthesia Etomidate   Time Out Verified patient identification, verified procedure, site/side was marked, verified correct patient position, special equipment/implants available, medications/allergies/relevant history reviewed, required imaging and test results available.   Sterile Technique Usual hand hygeine, masks, and gloves were used   Procedure Description Patient positioned in bed supine.  Sedation given as noted above.  Patient was intubated with endotracheal tube using Glidescope.  View was Grade 1 full glottis .  Number of attempts was 1.  Colorimetric CO2 detector was consistent with tracheal placement.   Complications/Tolerance None; patient tolerated the procedure well. Chest X-ray is ordered to verify placement.   EBL Minimal   Specimen(s) None   Rutherford Guys, PA - C [place] [facility] For pager details, please see AMION or use Epic chat  After 1900, please call [facility] for cross coverage needs 07/07/2023, 5:39 PM

## 2023-07-07 NOTE — Progress Notes (Signed)
 Pharmacy Antibiotic Note  Randy Allen is a 63 y.o. male admitted on 07/02/2023 with pneumonia.  Pharmacy has been consulted for vancomycin and cefepime dosing.  WBC 31, afebrile Now requiring 6L [place] CXR 3/24 with bilateral pleural effusions, possible atelectasis or airspace disease in LLL  Plan: Cefepime 2g IV every 8 hours Vancomycin 1250mg  IV x1 loading dose Vancomycin 1000mg  IV every 12 hours (eAUC 525, Scr 0.8, eCmin 14, IBW, Vd 0.7) Goal AUC 400-600 Follow CBC, renal function, fever curve, signs of clinical improvement Follow up cultures to deescalate  Height: 5\' 8"  (172.7 cm) Weight: 65 kg (143 lb 4.8 oz) IBW/kg (Calculated) : 68.4  Temp (24hrs), Avg:98.1 F (36.7 C), Min:97.8 F (36.6 C), Max:98.3 F (36.8 C)  Recent Labs  Lab 07/03/23 0519 07/04/23 0418 07/05/23 0353 07/05/23 1530 07/06/23 0823 07/07/23 0738  WBC 16.5* 19.3* 17.0* 19.2* 20.4* 31.1*  CREATININE 0.89 0.71 0.65  --  0.67 0.75    Estimated Creatinine Clearance: 88 mL/min (by C-G formula based on SCr of 0.75 mg/dL).    Allergies  Allergen Reactions   Zoloft [Sertraline Hcl] Other (See Comments)    HYPERACTIVE   Quinolones Other (See Comments)    Aortic aneurysm--Did not feel well    Antimicrobials this admission: 3/24 Vancomycin  >>  3/24 cefepime  >>     Microbiology results: 3/24 MRSA PCR: negative  Thank you for allowing pharmacy to be a part of this patient's care.  Stephenie Acres, PharmD PGY1 Pharmacy Resident 07/07/2023 9:03 AM

## 2023-07-07 NOTE — Procedures (Signed)
 Arterial Catheter Insertion Procedure Note  Randy Allen  [id number]  May 10, 1960  Date:07/07/23  Time:5:39 PM    Provider Performing: Rutherford Guys    Procedure: Insertion of Arterial Line (40981) with US guidance (19147)   Indication(s) Blood pressure monitoring and/or need for frequent ABGs  Consent Risks of the procedure as well as the alternatives and risks of each were explained to the patient and/or caregiver.  Consent for the procedure was obtained and is signed in the bedside chart  Anesthesia None   Time Out Verified patient identification, verified procedure, site/side was marked, verified correct patient position, special equipment/implants available, medications/allergies/relevant history reviewed, required imaging and test results available.   Sterile Technique Maximal sterile technique including full sterile barrier drape, hand hygiene, sterile gown, sterile gloves, mask, hair covering, sterile ultrasound probe cover (if used).   Procedure Description Area of catheter insertion was cleaned with chlorhexidine and draped in sterile fashion. With real-time ultrasound guidance an arterial catheter was placed into the right femoral artery.  Appropriate arterial tracings confirmed on monitor.     Complications/Tolerance None; patient tolerated the procedure well.   EBL Minimal   Specimen(s) None   Rutherford Guys, PA - [facility] For pager details, please see AMION or use Epic chat  After 1900, please call [facility] for cross coverage needs 07/07/2023, 5:40 PM

## 2023-07-08 ENCOUNTER — Inpatient Hospital Stay (HOSPITAL_COMMUNITY)

## 2023-07-08 ENCOUNTER — Other Ambulatory Visit: Payer: Self-pay

## 2023-07-08 DIAGNOSIS — R6521 Severe sepsis with septic shock: Secondary | ICD-10-CM | POA: Diagnosis not present

## 2023-07-08 DIAGNOSIS — J9601 Acute respiratory failure with hypoxia: Secondary | ICD-10-CM | POA: Diagnosis not present

## 2023-07-08 DIAGNOSIS — R578 Other shock: Secondary | ICD-10-CM | POA: Diagnosis not present

## 2023-07-08 DIAGNOSIS — A419 Sepsis, unspecified organism: Secondary | ICD-10-CM | POA: Diagnosis not present

## 2023-07-08 DIAGNOSIS — J90 Pleural effusion, not elsewhere classified: Secondary | ICD-10-CM | POA: Diagnosis not present

## 2023-07-08 DIAGNOSIS — N179 Acute kidney failure, unspecified: Secondary | ICD-10-CM

## 2023-07-08 DIAGNOSIS — R7401 Elevation of levels of liver transaminase levels: Secondary | ICD-10-CM

## 2023-07-08 LAB — CBC
HCT: 42 % (ref 39.0–52.0)
Hemoglobin: 14 g/dL (ref 13.0–17.0)
MCH: 31.4 pg (ref 26.0–34.0)
MCHC: 33.3 g/dL (ref 30.0–36.0)
MCV: 94.2 fL (ref 80.0–100.0)
Platelets: 142 10*3/uL — ABNORMAL LOW (ref 150–400)
RBC: 4.46 MIL/uL (ref 4.22–5.81)
RDW: 17.9 % — ABNORMAL HIGH (ref 11.5–15.5)
WBC: 5.6 10*3/uL (ref 4.0–10.5)
nRBC: 0.9 % — ABNORMAL HIGH (ref 0.0–0.2)

## 2023-07-08 LAB — BODY FLUID CELL COUNT WITH DIFFERENTIAL
Eos, Fluid: 0 %
Lymphs, Fluid: 0 %
Monocyte-Macrophage-Serous Fluid: 10 % — ABNORMAL LOW (ref 50–90)
Neutrophil Count, Fluid: 90 % — ABNORMAL HIGH (ref 0–25)
Total Nucleated Cell Count, Fluid: 5125 uL — ABNORMAL HIGH (ref 0–1000)

## 2023-07-08 LAB — COMPREHENSIVE METABOLIC PANEL
ALT: 95 U/L — ABNORMAL HIGH (ref 0–44)
AST: 168 U/L — ABNORMAL HIGH (ref 15–41)
Albumin: 2.6 g/dL — ABNORMAL LOW (ref 3.5–5.0)
Alkaline Phosphatase: 312 U/L — ABNORMAL HIGH (ref 38–126)
Anion gap: 15 (ref 5–15)
BUN: 47 mg/dL — ABNORMAL HIGH (ref 8–23)
CO2: 19 mmol/L — ABNORMAL LOW (ref 22–32)
Calcium: 8.1 mg/dL — ABNORMAL LOW (ref 8.9–10.3)
Chloride: 102 mmol/L (ref 98–111)
Creatinine, Ser: 1.79 mg/dL — ABNORMAL HIGH (ref 0.61–1.24)
GFR, Estimated: 42 mL/min — ABNORMAL LOW (ref >60)
Glucose, Bld: 128 mg/dL — ABNORMAL HIGH (ref 70–99)
Potassium: 4.3 mmol/L (ref 3.5–5.1)
Sodium: 136 mmol/L (ref 135–145)
Total Bilirubin: 6.9 mg/dL — ABNORMAL HIGH (ref 0.0–1.2)
Total Protein: 5 g/dL — ABNORMAL LOW (ref 6.5–8.1)

## 2023-07-08 LAB — ECHOCARDIOGRAM COMPLETE
AR max vel: 1.72 cm2
AV Area VTI: 1.71 cm2
AV Area mean vel: 1.74 cm2
AV Mean grad: 6.5 mmHg
AV Peak grad: 12.5 mmHg
Ao pk vel: 1.77 m/s
Area-P 1/2: 4.68 cm2
Calc EF: 48.1 %
Height: 68 in
S' Lateral: 3 cm
Single Plane A2C EF: 40.7 %
Single Plane A4C EF: 48.9 %
Weight: 2176.38 [oz_av]

## 2023-07-08 LAB — PREPARE FRESH FROZEN PLASMA
Unit division: 0
Unit division: 0

## 2023-07-08 LAB — PROTEIN, PLEURAL OR PERITONEAL FLUID: Total protein, fluid: <3 g/dL

## 2023-07-08 LAB — HEMOGLOBIN AND HEMATOCRIT, BLOOD
HCT: 38.7 % — ABNORMAL LOW (ref 39.0–52.0)
Hemoglobin: 12.6 g/dL — ABNORMAL LOW (ref 13.0–17.0)

## 2023-07-08 LAB — GLUCOSE, PLEURAL OR PERITONEAL FLUID: Glucose, Fluid: 91 mg/dL

## 2023-07-08 LAB — BPAM FFP
Blood Product Expiration Date: 202503292359
Blood Product Expiration Date: 202503292359
ISSUE DATE / TIME: 202503242007
ISSUE DATE / TIME: 202503242258
Unit Type and Rh: 6200
Unit Type and Rh: 6200

## 2023-07-08 LAB — PHOSPHORUS: Phosphorus: 5.6 mg/dL — ABNORMAL HIGH (ref 2.5–4.6)

## 2023-07-08 LAB — PROTEIN, TOTAL: Total Protein: 4.6 g/dL — ABNORMAL LOW (ref 6.5–8.1)

## 2023-07-08 LAB — LACTATE DEHYDROGENASE, PLEURAL OR PERITONEAL FLUID: LD, Fluid: 395 U/L — ABNORMAL HIGH (ref 3–23)

## 2023-07-08 LAB — ALBUMIN, PLEURAL OR PERITONEAL FLUID: Albumin, Fluid: 1.9 g/dL

## 2023-07-08 LAB — GLUCOSE, CAPILLARY
Glucose-Capillary: 127 mg/dL — ABNORMAL HIGH (ref 70–99)
Glucose-Capillary: 116 mg/dL — ABNORMAL HIGH (ref 70–99)
Glucose-Capillary: 76 mg/dL (ref 70–99)
Glucose-Capillary: 97 mg/dL (ref 70–99)
Glucose-Capillary: 72 mg/dL (ref 70–99)
Glucose-Capillary: 56 mg/dL — ABNORMAL LOW (ref 70–99)

## 2023-07-08 LAB — PROTIME-INR
INR: 1.7 — ABNORMAL HIGH (ref 0.8–1.2)
Prothrombin Time: 20.5 s — ABNORMAL HIGH (ref 11.4–15.2)

## 2023-07-08 LAB — LACTATE DEHYDROGENASE: LDH: 737 U/L — ABNORMAL HIGH (ref 98–192)

## 2023-07-08 LAB — APTT: aPTT: 36 s (ref 24–36)

## 2023-07-08 LAB — MAGNESIUM: Magnesium: 1.8 mg/dL (ref 1.7–2.4)

## 2023-07-08 LAB — D-DIMER, QUANTITATIVE: D-Dimer, Quant: 3.83 ug{FEU}/mL — ABNORMAL HIGH (ref 0.00–0.50)

## 2023-07-08 LAB — FIBRINOGEN: Fibrinogen: 633 mg/dL — ABNORMAL HIGH (ref 210–475)

## 2023-07-08 LAB — LACTIC ACID, PLASMA: Lactic Acid, Venous: 3.6 mmol/L (ref 0.5–1.9)

## 2023-07-08 LAB — SURGICAL PATHOLOGY

## 2023-07-08 MED ORDER — DEXTROSE 50 % IV SOLN: 25.0000 mL | Freq: Once | INTRAVENOUS | Status: AC

## 2023-07-08 MED ORDER — NOREPINEPHRINE 16 MG/250ML-% IV SOLN
0.0000 ug/min | INTRAVENOUS | Status: DC
  Administered 2023-07-08: 32 ug/min via INTRAVENOUS
  Administered 2023-07-09 – 2023-07-10 (×2): 10 ug/min via INTRAVENOUS
  Filled 2023-07-08 (×3): qty 250

## 2023-07-08 MED ORDER — SODIUM CHLORIDE 0.9% FLUSH: 10.0000 mL | INTRAVENOUS | Status: DC | PRN

## 2023-07-08 MED ORDER — VASOPRESSIN 20 UNITS/100 ML INFUSION FOR SHOCK
0.0000 [IU]/min | INTRAVENOUS | Status: DC
  Administered 2023-07-08: 0.03 [IU]/min via INTRAVENOUS
  Administered 2023-07-09 (×3): 0.04 [IU]/min via INTRAVENOUS
  Administered 2023-07-10: 0.02 [IU]/min via INTRAVENOUS
  Filled 2023-07-08 (×6): qty 100

## 2023-07-08 MED ORDER — PERFLUTREN LIPID MICROSPHERE
1.0000 mL | INTRAVENOUS | Status: AC | PRN
  Administered 2023-07-08: 2 mL via INTRAVENOUS

## 2023-07-08 MED ORDER — DEXTROSE 50 % IV SOLN
INTRAVENOUS | Status: AC
  Administered 2023-07-08: 25 mL via INTRAVENOUS
  Filled 2023-07-08: qty 50

## 2023-07-08 MED ORDER — LACTATED RINGERS IV SOLN: INTRAVENOUS | Status: DC

## 2023-07-08 MED ORDER — INSULIN ASPART 100 UNIT/ML IJ SOLN: 0.0000 [IU] | INTRAMUSCULAR | Status: DC

## 2023-07-08 MED ORDER — HYDROCORTISONE SOD SUC (PF) 100 MG IJ SOLR
100.0000 mg | Freq: Two times a day (BID) | INTRAMUSCULAR | Status: DC
  Administered 2023-07-08 – 2023-07-10 (×5): 100 mg via INTRAVENOUS
  Filled 2023-07-08 (×5): qty 2

## 2023-07-08 MED ORDER — SODIUM CHLORIDE 0.9% FLUSH
10.0000 mL | Freq: Two times a day (BID) | INTRAVENOUS | Status: DC
  Administered 2023-07-08 – 2023-07-09 (×3): 10 mL

## 2023-07-08 MED ORDER — ALBUMIN HUMAN 5 % IV SOLN
25.0000 g | Freq: Once | INTRAVENOUS | Status: AC
  Administered 2023-07-08: 25 g via INTRAVENOUS
  Filled 2023-07-08: qty 500

## 2023-07-08 NOTE — Progress Notes (Signed)
 Peripherally Inserted Central Catheter Placement  The IV Nurse has discussed with the patient and/or persons authorized to consent for the patient, the purpose of this procedure and the potential benefits and risks involved with this procedure.  The benefits include less needle sticks, lab draws from the catheter, and the patient may be discharged home with the catheter. Risks include, but not limited to, infection, bleeding, blood clot (thrombus formation), and puncture of an artery; nerve damage and irregular heartbeat and possibility to perform a PICC exchange if needed/ordered by physician.  Alternatives to this procedure were also discussed.  Bard Power PICC patient education guide, fact sheet on infection prevention and patient information card has been provided to patient /or left at bedside.    PICC Placement Documentation  PICC Triple Lumen 07/08/23 Right Basilic 37 cm 0 cm (Active)  Indication for Insertion or Continuance of Line Vasoactive infusions 07/08/23 1456  Site Assessment Clean, Dry, Intact 07/08/23 1456  Lumen #1 Status Flushed;Blood return noted;Saline locked 07/08/23 1456  Lumen #2 Status Flushed;Saline locked;Blood return noted 07/08/23 1456  Lumen #3 Status Flushed;Blood return noted;Saline locked 07/08/23 1456  Dressing Type Transparent;Securing device 07/08/23 1456  Dressing Status Antimicrobial disc/dressing in place 07/08/23 1456  Line Adjustment (NICU/IV Team Only) No 07/08/23 1456  Dressing Change Due 07/15/23 07/08/23 1456       Romie Jumper 07/08/2023, 3:03 PM

## 2023-07-08 NOTE — Progress Notes (Signed)
 Nutrition Follow-up / Consult  DOCUMENTATION CODES:   Severe malnutrition in context of chronic illness  INTERVENTION:   TPN dosing per Pharmacy to meet 100% of estimated nutrition needs.  When able to resume TF via J-tube, recommend: Osmolite 1.5 at 25 ml/h. Increase by 10 ml every 8 hours to goal rate of 55 ml/hr (1320 ml per day). Prosource TF20 60 ml BID. Provides 2140 kcal, 122 gm protein, 1003 ml free water daily.  NUTRITION DIAGNOSIS:   Severe Malnutrition related to chronic illness (cancer) as evidenced by moderate muscle depletion, moderate fat depletion, energy intake < or equal to 75% for > or equal to 1 month, percent weight loss.  Ongoing   GOAL:   Patient will meet greater than or equal to 90% of their needs  Progressing with initiation of TPN  MONITOR:   I & O's, Vent status  REASON FOR ASSESSMENT:   Consult Enteral/tube feeding initiation and management  ASSESSMENT:   Pt with PMH of HTN, bicsupid aortic valve, Vitamin D deficiency, and dx with pancreatic cancer 12/2022 and received 8 cycles of FOLFIRINOX prior to surgery. Pt now admitted for [facility].  Patient required intubation 3/24 after developing encephalopathy, tachypnea, hypotension, and concern for septic shock with presumed intraabdominal source.   S/P L chest tube placement today for pleural effusion.   Patient remains intubated on ventilator support; currently requiring 2 pressors. MV: 14 L/min Temp (24hrs), Avg:99.1 F (37.3 C), Min:96.7 F (35.9 C), Max:100.8 F (38.2 C)   JP drain output 3,345 ml x 24 hours (old blood). OG output 1,800 ml yesterday.  TF via J-tube on hold since decompensation on 3/24.  Plans to begin TPN tomorrow.  Labs reviewed. Phos 5.6 CBG: 127-116-76  Medications reviewed and include solu-cortef, novolog, IV Zofran, fentanyl, levophed, vasopressin. IVF: LR at 125 ml/h.  Admit weight 60.3 kg Current weight 61.7 kg  Diet Order:   Diet Order              Diet NPO time specified  Diet effective now                   EDUCATION NEEDS:   Education needs have been addressed  Skin:  Skin Assessment: Reviewed RN Assessment (abd incision)  Last BM:  3/24  Height:   Ht Readings from Last 1 Encounters:  07/02/23 5\' 8"  (1.727 m)    Weight:   Wt Readings from Last 1 Encounters:  07/08/23 61.7 kg    Ideal Body Weight:  70 kg  BMI:  Body mass index is 20.68 kg/m.  Estimated Nutritional Needs:   Kcal:  2000-2200  Protein:  100-120 grams  Fluid:  >2 L/day   Gabriel Rainwater RD, LDN, CNSC Contact via secure chat. If unavailable, use group chat "RD Inpatient."

## 2023-07-08 NOTE — Procedures (Signed)
 Insertion of Chest Tube Procedure Note  Randy Allen  [id number]  [birth year]  Date:07/08/23  Time:11:56 AM    Provider Performing: Rutherford Guys   Procedure: Chest Tube Insertion (32551)  Indication(s) Effusion  Consent Risks of the procedure as well as the alternatives and risks of each were explained to the patient and/or caregiver.  Consent for the procedure was obtained and is signed in the bedside chart  Anesthesia Topical only with 1% lidocaine    Time Out Verified patient identification, verified procedure, site/side was marked, verified correct patient position, special equipment/implants available, medications/allergies/relevant history reviewed, required imaging and test results available.   Sterile Technique Maximal sterile technique including full sterile barrier drape, hand hygiene, sterile gown, sterile gloves, mask, hair covering, sterile ultrasound probe cover (if used).   Procedure Description Ultrasound used to identify appropriate pleural anatomy for placement and overlying skin marked. Area of placement cleaned and draped in sterile fashion.  A 14 French pigtail pleural catheter was placed into the left pleural space using Seldinger technique. Appropriate return of fluid was obtained.  The tube was connected to atrium and placed on -20 cm H2O wall suction.   Complications/Tolerance None; patient tolerated the procedure well. Chest X-ray is ordered to verify placement.   EBL Minimal  Specimen(s) none            Rutherford Guys, PA - [facility] [place] [facility] For pager details, please see AMION or use Epic chat  After 1900, please call [facility] for cross coverage needs 07/08/2023, 11:56 AM

## 2023-07-08 NOTE — Progress Notes (Signed)
 6 Days Post-Op    Subjective: Patient had major event yesterday afternoon with increasing shortness of breath and tachycardia, hypotension, and massive amount of old bloody output from his drains.  He was reintubated and placed on pressors.  He received several units of blood and appeared to go into DIC at least temporarily.  His lactate was 9 at the time.  His antibiotics I started yesterday were broadened.  J-tube feeds were held.  Overnight his blood pressure stabilized on Levophed and his drain output decreased after administration of 2 units of packed cells and 2 units of FFP.  He was placed back on IV fluid and received fluid and albumin boluses as well to assist with his blood pressure.   Objective: Vital signs in last 24 hours: Temp:  [96.7 F (35.9 C)-100.8 F (38.2 C)] 98.1 F (36.7 C) (03/25 1145) Pulse Rate:  [62-124] 113 (03/25 1300) Resp:  [17-41] 18 (03/25 1300) BP: (66-146)/(32-121) 146/106 (03/25 1300) SpO2:  [88 %-100 %] 99 % (03/25 1300) FiO2 (%):  [50 %-100 %] 50 % (03/25 1118) Weight:  [61.7 kg] 61.7 kg (03/25 0500) Last BM Date : 07/07/23  Intake/Output from previous day: 03/24 0701 - 03/25 0700 In: 4091.4 [I.V.:1718.8; Blood:1064; NG/GT:365; IV Piggyback:899.9] Out: 6065 [Urine:500; Emesis/NG output:1800; Drains:3345] Intake/Output this shift: Total I/O In: 1782.7 [I.V.:1175.5; IV Piggyback:607.2] Out: 10 [Urine:10]  Gen: Sedated on ventilator Resp: Comfortable and in sync with the ventilator CV regular, tachycardic, heart rate lower than yesterday Abd:  soft, non distended, J-tube in place.  Right lateral drain looks like old blood that may be bile-stained.  Medial drain looks like old blood. Ext:  warm, well perfused.  No edema. Scds in place.   Lab Results: CBC  Recent Labs    07/07/23 1726 07/07/23 1744 07/08/23 0304 07/08/23 1031  WBC 10.8*  --  5.6  --   HGB 8.2*   < > 14.0 12.6*  HCT 25.8*   < > 42.0 38.7*  PLT 157  --  142*  --    < > =  values in this interval not displayed.   BMET Recent Labs    07/07/23 1717 07/07/23 1744 07/08/23 0304  NA 135 138 136  K 6.2* 3.4* 4.3  CL 101  --  102  CO2 13*  --  19*  GLUCOSE 174*  --  128*  BUN 34*  --  47*  CREATININE 1.46*  --  1.79*  CALCIUM 8.4*  --  8.1*   PT/INR Recent Labs    07/07/23 1717 07/08/23 0521  LABPROT 22.5* 20.5*  INR 2.0* 1.7*   ABG Recent Labs    07/07/23 0646 07/07/23 1744  PHART 7.483* 7.359  HCO3 24.1 16.9*    Studies/Results: ECHOCARDIOGRAM COMPLETE Result Date: 07/08/2023    ECHOCARDIOGRAM REPORT   Patient Name:   Randy Allen Date of Exam: 07/08/2023 Medical Rec #:  [id number]    Height:       68.0 in Accession #:    [id number]   Weight:       136.0 lb Date of Birth:  Sep 12, 1960   BSA:          1.735 m Patient Age:    62 years     BP:           96/63 mmHg Patient Gender: M            HR:           109 bpm. Exam  Location:  Inpatient Procedure: 2D Echo, Cardiac Doppler, Color Doppler and Intracardiac            Opacification Agent (Both Spectral and Color Flow Doppler were            utilized during procedure). STAT ECHO Indications:    Shock  History:        Patient has prior history of Echocardiogram examinations, most                 recent 06/30/2023. Aortic Valve Disease; Risk                 Factors:Hypertension. Cancer. Bicudspid aortic valve. Ascending                 aneurysm.  Sonographer:    Sheralyn Boatman RDCS Referring Phys: [id number] [facility] P DESAI  Sonographer Comments: Technically difficult study due to poor echo windows and echo performed with patient supine and on artificial respirator. IMPRESSIONS  1. Large pleural effusion in the left lateral region. There is secondary, compressive, atelectasis.  2. Stat echo, unable to get on axis gradient. Unclear degree of aortic stenosis. The aortic valve was not well visualized. Aortic valve regurgitation is trivial.  3. Left ventricular ejection fraction, by estimation, is 70 to 75%. The left ventricle  has hyperdynamic function. Left ventricular endocardial border not optimally defined to evaluate regional wall motion. Left ventricular diastolic parameters are indeterminate.  4. Aortic dilatation noted. There is moderate dilatation of the ascending aorta, measuring 46 mm.  5. The mitral valve was not well visualized. No evidence of mitral valve regurgitation.  6. The inferior vena cava is normal in size with greater than 50% respiratory variability, suggesting right atrial pressure of 3 mmHg.  7. Right ventricular systolic function was not well visualized. The right ventricular size is moderately enlarged. Comparison(s): Prior images reviewed side by side. Technically challenging study. Aortic stenosis is likely unchanged from recent echo. Pleural effusion is large and new. FINDINGS  Left Ventricle: Left ventricular ejection fraction, by estimation, is 70 to 75%. The left ventricle has hyperdynamic function. Left ventricular endocardial border not optimally defined to evaluate regional wall motion. Definity contrast agent was given IV to delineate the left ventricular endocardial borders. Strain was performed and the global longitudinal strain is indeterminate. The left ventricular internal cavity size was small. There is no left ventricular hypertrophy. Left ventricular diastolic parameters are indeterminate. Right Ventricle: The right ventricular size is moderately enlarged. Right vetricular wall thickness was not well visualized. Right ventricular systolic function was not well visualized. Left Atrium: Left atrial size was normal in size. Right Atrium: Right atrial size was normal in size. Pericardium: There is no evidence of pericardial effusion. Mitral Valve: The mitral valve was not well visualized. No evidence of mitral valve regurgitation. Tricuspid Valve: The tricuspid valve is normal in structure. Tricuspid valve regurgitation is not demonstrated. No evidence of tricuspid stenosis. Aortic Valve: Stat  echo, unable to get on axis gradient. Unclear degree of aortic stenosis. The aortic valve was not well visualized. Aortic valve regurgitation is trivial. Aortic valve mean gradient measures 6.5 mmHg. Aortic valve peak gradient measures  12.5 mmHg. Aortic valve area, by VTI measures 1.71 cm. Pulmonic Valve: The pulmonic valve was normal in structure. Pulmonic valve regurgitation is not visualized. No evidence of pulmonic stenosis. Aorta: Aortic dilatation noted. There is moderate dilatation of the ascending aorta, measuring 46 mm. Venous: The inferior vena cava is normal in size with greater than  50% respiratory variability, suggesting right atrial pressure of 3 mmHg. IAS/Shunts: The interatrial septum was not well visualized. Additional Comments: 3D was performed not requiring image post processing on an independent workstation and was indeterminate. There is a large pleural effusion in the left lateral region.  LEFT VENTRICLE PLAX 2D LVIDd:         3.80 cm      Diastology LVIDs:         3.00 cm      LV e' medial:    7.62 cm/s LV PW:         1.15 cm      LV E/e' medial:  5.4 LV IVS:        1.15 cm      LV e' lateral:   7.18 cm/s LVOT diam:     2.10 cm      LV E/e' lateral: 5.7 LV SV:         39 LV SV Index:   22 LVOT Area:     3.46 cm  LV Volumes (MOD) LV vol d, MOD A2C: 73.8 ml LV vol d, MOD A4C: 107.0 ml LV vol s, MOD A2C: 43.8 ml LV vol s, MOD A4C: 54.7 ml LV SV MOD A2C:     30.0 ml LV SV MOD A4C:     107.0 ml LV SV MOD BP:      46.1 ml RIGHT VENTRICLE             IVC RV S prime:     13.70 cm/s  IVC diam: 1.60 cm LEFT ATRIUM             Index        RIGHT ATRIUM          Index LA diam:        2.70 cm 1.56 cm/m   RA Area:     6.12 cm LA Vol (A2C):   18.5 ml 10.66 ml/m  RA Volume:   8.34 ml  4.81 ml/m LA Vol (A4C):   9.2 ml  5.29 ml/m LA Biplane Vol: 13.8 ml 7.95 ml/m  AORTIC VALVE AV Area (Vmax):    1.72 cm AV Area (Vmean):   1.74 cm AV Area (VTI):     1.71 cm AV Vmax:           176.50 cm/s AV Vmean:           114.250 cm/s AV VTI:            0.227 m AV Peak Grad:      12.5 mmHg AV Mean Grad:      6.5 mmHg LVOT Vmax:         87.40 cm/s LVOT Vmean:        57.300 cm/s LVOT VTI:          0.112 m LVOT/AV VTI ratio: 0.49  AORTA Ao Asc diam: 4.60 cm MITRAL VALVE MV Area (PHT): 4.68 cm    SHUNTS MV Decel Time: 162 msec    Systemic VTI:  0.11 m MV E velocity: 40.90 cm/s  Systemic Diam: 2.10 cm MV A velocity: 61.30 cm/s MV E/A ratio:  0.67 Riley Lam MD Electronically signed by Riley Lam MD Signature Date/Time: 07/08/2023/12:51:59 PM    Final    [place] EKG SITE RITE Result Date: 07/08/2023 If Site Rite image not attached, placement could not be confirmed due to current cardiac rhythm.  Portable Chest xray Result Date: 07/08/2023 CLINICAL DATA:  [id number] Respiratory  failure [facility]) [id number] EXAM: PORTABLE CHEST 1 VIEW COMPARISON:  07/07/2023 FINDINGS: Interval placement of enteric tube with distal tip extending beyond the inferior margin of the film. Left chest port and endotracheal tube remain appropriately positioned. Normal heart size. Moderate left and trace right pleural effusions with associated bibasilar opacities. No pneumothorax is identified. IMPRESSION: Moderate left and trace right pleural effusions with associated bibasilar opacities, progressed from prior. Electronically Signed   By: Duanne Guess D.O.   On: 07/08/2023 10:46   DG Abd Portable 1V Result Date: 07/07/2023 CLINICAL DATA:  Check gastric catheter placement EXAM: PORTABLE ABDOMEN - 1 VIEW COMPARISON:  None Available. FINDINGS: Gastric catheter is coiled within the stomach. Postsurgical changes are seen. No free air is noted. Small left effusion is seen. IMPRESSION: Gastric catheter within the stomach. Electronically Signed   By: Alcide Clever M.D.   On: 07/07/2023 22:28   CT ANGIO GI BLEED Addendum Date: 07/07/2023 ADDENDUM REPORT: 07/07/2023 21:26 ADDENDUM: These results were called by telephone at the time of interpretation on  07/07/2023 at 9:25 pm to provider Dr. Sheliah Hatch, who verbally acknowledged these results. Electronically Signed   By: Darliss Cheney M.D.   On: 07/07/2023 21:26   Result Date: 07/07/2023 CLINICAL DATA:  Status post Whipple with recent anastomotic bleed. Blood in peritoneal cavity. EXAM: CTA ABDOMEN AND PELVIS WITHOUT AND WITH CONTRAST TECHNIQUE: Multidetector CT imaging of the abdomen and pelvis was performed using the standard protocol during bolus administration of intravenous contrast. Multiplanar reconstructed images and MIPs were obtained and reviewed to evaluate the vascular anatomy. RADIATION DOSE REDUCTION: This exam was performed according to the departmental dose-optimization program which includes automated exposure control, adjustment of the mA and/or kV according to patient size and/or use of iterative reconstruction technique. CONTRAST:  OMNIPAQUE IOHEXOL 350 MG/ML SOLN COMPARISON:  CT abdomen and pelvis 05/19/2023. CT angiogram abdomen 08/16/2020. FINDINGS: VASCULAR Aorta: Normal caliber aorta without aneurysm, dissection, vasculitis or significant stenosis. Celiac: Patent without evidence of aneurysm, dissection, vasculitis or significant stenosis. 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 The remaining superior mesenteric artery appears within normal limits. Findings are similar to 2022. Renals: Both renal arteries are patent without evidence of aneurysm, dissection, vasculitis, fibromuscular dysplasia or significant stenosis. IMA: Patent without evidence of aneurysm, dissection, vasculitis or significant stenosis. 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 The external iliac arteries and common iliac arteries appear within  normal limits. Right femoral artery catheter is in place. Proximal Outflow: Bilateral common femoral and visualized portions of the superficial and profunda femoral arteries are patent without evidence of aneurysm, dissection, vasculitis or significant stenosis. Veins: Right common femoral vein catheter is present with distal tip ending in the inferior IVC. Portal vein, splenic vein and superior mesenteric vein are patent. IVC is patent. Review of the MIP images confirms the above findings. NON-VASCULAR Lower chest: Small right and moderate left pleural effusions are present with compressive atelectasis of the bilateral lower lobes. Hepatobiliary: Gallbladder has been surgically removed. There is minimal fluid and air in the gallbladder fossa without discrete fluid collection. No biliary ductal dilatation. No focal liver lesion. Pancreas: Status post Whipple. Pancreatic body and tail are within normal limits. There is a new pancreatic duct stent in place. Spleen:  Normal in size without focal abnormality. There is a subcapsular perisplenic fluid collection with some mild wall enhancement measuring up to 1 cm. There is a small amount of air within this collection as it abuts the upper greater curvature of the stomach. Can not exclude contained perforation of the stomach at this level image 16/53. Adrenals/Urinary Tract: The bladder is decompressed by Foley catheter. There is a subcentimeter cyst in the inferior pole of the left kidney. Otherwise, the kidneys and adrenal glands are within normal limits. Stomach/Bowel: Patient is status post Whipple procedure. There are postsurgical changes in the distal stomach. No active gastrointestinal bleeding identified within the stomach. The stomach is dilated with large air-fluid level. There is also fluid dilatation of the distal esophagus. The duodenal is dilated and fluid-filled measuring up to 3.5 cm percutaneous jejunostomy tube in place. Jejunal loops are distended with  fluid. Distal ileal loops are decompressed. The appendix is within normal limits. There is diffuse colonic wall thickening. No active bleeding identified in the small bowel or colon. Lymphatic: No enlarged lymph nodes are identified. Reproductive: The prostate gland is enlarged. Other: There is a small amount of free fluid throughout the abdomen and pelvis. Two surgical drains are seen in the upper abdomen. There is diffuse body wall edema. Midline skin staples are present. No focal hernia. Musculoskeletal: No fracture is seen. IMPRESSION: 1. No evidence for active gastrointestinal bleeding. 2. 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 3. 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 NON-VASCULAR 1. Moderate-sized subcapsular perisplenic fluid collection with mild wall enhancement. There is a small amount of air within this collection as it abuts the upper greater curvature of the stomach. Can not exclude contained perforation of the stomach at this level. 2. Diffuse gastric and duodenal dilatation with percutaneous jejunostomy tube in place. Findings may represent ileus or obstruction. 3. Diffuse colonic wall thickening worrisome for colitis. 4. Small amount of free fluid throughout the abdomen and pelvis. 5. Diffuse body wall edema. 6. Small right and moderate left pleural effusions with compressive atelectasis of the bilateral lower lobes. Electronically Signed: By: Darliss Cheney M.D. On: 07/07/2023 21:08   DG CHEST PORT 1 VIEW Result Date: 07/07/2023 CLINICAL DATA:  Check endotracheal tube placement EXAM: PORTABLE CHEST 1 VIEW COMPARISON:  07/07/2023 FINDINGS: The tracheal tube is noted in satisfactory position. Left chest wall port is again seen. Lungs are well aerated bilaterally. Slight increased density is noted in the left chest consistent with a posteriorly layering  effusion redistributed when compared with the prior exam. IMPRESSION: Left-sided pleural effusion.  No other focal abnormality is noted. Electronically Signed   By: Alcide Clever M.D.   On: 07/07/2023 19:31   DG Chest Port 1 View Result Date: 07/07/2023 CLINICAL DATA:  Respiratory failure EXAM: PORTABLE CHEST 1 VIEW COMPARISON:  Chest x-ray 07/07/2023 FINDINGS: Left chest port catheter tip projects over the mid SVC. The heart is mildly enlarged. There is a moderate-sized left pleural effusion which has increased. There is no focal lung consolidation or pneumothorax. There is likely gaseous distention of the upper esophagus. IMPRESSION: 1. Increasing moderate left pleural effusion. 2. Mild cardiomegaly. 3. Likely gaseous distention of the upper esophagus. Electronically Signed   By: Darliss Cheney M.D.   On: 07/07/2023 19:29   DG Abd Portable 1V Result Date: 07/07/2023 CLINICAL DATA:  Abdominal pain EXAM: PORTABLE ABDOMEN -  1 VIEW COMPARISON:  None Available. FINDINGS: Scattered large and small bowel gas is noted. Postsurgical changes are seen. No obstructive changes are noted. No free air is seen. IMPRESSION: No acute abnormality noted. Electronically Signed   By: Alcide Clever M.D.   On: 07/07/2023 19:28   DG Chest Portable 1 View Result Date: 07/07/2023 CLINICAL DATA:  Shortness of breath EXAM: PORTABLE CHEST 1 VIEW COMPARISON:  02/12/2023 FINDINGS: Left chest wall port a catheter noted with tip at the distal SVC. Stable cardiomediastinal contours. New small bilateral pleural effusions are identified, left greater than right. Decreased aeration to the left lower lobe may reflect atelectasis or airspace disease. Visualized osseous structures are unremarkable. IMPRESSION: 1. New small bilateral pleural effusions, left greater than right. 2. Decreased aeration to the left lower lobe may reflect atelectasis or airspace disease. Electronically Signed   By: Signa Kell M.D.   On: 07/07/2023 07:21     Anti-infectives: Anti-infectives (From admission, onward)    Start     Dose/Rate Route Frequency Ordered Stop   07/08/23 1200  vancomycin (VANCOCIN) IVPB 1000 mg/200 mL premix        1,000 mg 200 mL/hr over 60 Minutes Intravenous Every 24 hours 07/07/23 2030     07/08/23 0800  meropenem (MERREM) 1 g in sodium chloride 0.9 % 100 mL IVPB        1 g 200 mL/hr over 30 Minutes Intravenous Every 12 hours 07/07/23 2030     07/07/23 2300  vancomycin (VANCOCIN) IVPB 1000 mg/200 mL premix  Status:  Discontinued        1,000 mg 200 mL/hr over 60 Minutes Intravenous Every 12 hours 07/07/23 0954 07/07/23 2030   07/07/23 1800  meropenem (MERREM) 1 g in sodium chloride 0.9 % 100 mL IVPB  Status:  Discontinued        1 g 200 mL/hr over 30 Minutes Intravenous Every 8 hours 07/07/23 1758 07/07/23 2030   07/07/23 1045  ceFEPIme (MAXIPIME) 2 g in sodium chloride 0.9 % 100 mL IVPB  Status:  Discontinued        2 g 200 mL/hr over 30 Minutes Intravenous Every 8 hours 07/07/23 0954 07/07/23 1750   07/07/23 1045  vancomycin (VANCOREADY) IVPB 1250 mg/250 mL        1,250 mg 166.7 mL/hr over 90 Minutes Intravenous  Once 07/07/23 0954 07/07/23 1309   07/02/23 2200  ceFAZolin (ANCEF) IVPB 2g/100 mL premix        2 g 200 mL/hr over 30 Minutes Intravenous Every 8 hours 07/02/23 1814 07/02/23 2240   07/02/23 0730  ceFAZolin (ANCEF) IVPB 2g/100 mL premix        2 g 200 mL/hr over 30 Minutes Intravenous On call to O.R. 07/02/23 0719 07/02/23 1322      Pathology 07/02/23 A. LIVER, BIOPSY:  - Benign liver parenchyma, negative for carcinoma   B. WHIPPLE PROCEDURE WITH GALLBLADDER AND STENT:  - Foci of residual invasive poorly differentiated adenocarcinoma  - Distal pancreatic resection margin is focally positive for carcinoma,  see comment  - Carcinoma focally invades into peripancreatic soft tissue  - No evidence of lymphovascular invasion  - Seven lymph nodes, negative for carcinoma (0/7)  - Gallbladder  with chronic cholecystitis  - See oncology table   C. OMENTUM, RESECTION:  - Portion of omentum, negative for carcinoma    Assessment/Plan: Adenocarcinoma head of pancreas, cT2N0M0, s/p neoadjuvant chemotherapy POD 6 S/p whipple; ypT1aN0M0  Septic shock is currently overriding issue.  -  merropenem and vancomycin d2. Add antifungal coverage. - appears to have developed infected hematoma. -? Panc leak with infected hematoma.  Deflated 1 mL saline from J tube balloon in case that was causing any issue.   - Continue levophed. Weaning down.  Sending JP drains for bilirubin and amylase  VDRF - on vent per CCM. Left pleural effusion - chest tube placed by CCM, studies/culture pending.   FEN-  Holding tube feeds given pressor requirement Start TPN  Pain/sedation - fentanyl gtt. PRN versed.   Hyperglycemia  - SSI.  - will get worse with stress dose steroids and with TPN.   GI Recheck LFTS.  Anticipate that t bili will decrease now that hematoma is evacuated, but will follow.   VTE ppx- TEDS, holding pharmacologic ppx today, reassess tomorrow.   Dispo:  ICU   LOS: 6 days   Maudry Diego, MD, FACS, FSSO Surgical Oncology and General Surgery [facility], [place] [phone] for weekday/non holidays Check [email or web address] for coverage night/weekend/holidays   07/08/2023

## 2023-07-08 NOTE — Progress Notes (Signed)
 NAME:  Randy Allen, MRN:  [id number], DOB:  11-10-1960, LOS: 6 ADMISSION DATE:  07/02/2023, CONSULTATION DATE:  07/08/23 REFERRING MD:  CCS , CHIEF COMPLAINT:  encephalopathy   History of Present Illness:  63 year old man status post Whipple procedure 3/19 who were seen for clinical decompensation with encephalopathy tachypnea hypotension concern for septic versus Magick shock.  At time of my arrival patient is encephalopathic.  Very tachypneic.  Tachycardic to 140s.  Hypotensive.  Multiple pushes of phenylephrine.  Blood pressures improved.  Able to tolerate etomidate for intubation.  Good view.  Placed without difficulty.  Somewhat fresh blood noted in the esophagus during intubation.  Vasopressor dose escalated.  Central line arterial line placed.  Port was accessed.  Repeat labs sent.  Concern for septic shock and reactive shock.  Possible severe acidosis, acidemia given clinical picture.  Discussed with surgery at bedside.  Plan for repeat imaging then next steps pending imaging findings.  Pertinent  Medical History  Pancreatic cancer status post Whipple 07/02/2023  Significant Hospital Events: Including procedures, antibiotic start and stop dates in addition to other pertinent events   3/19 whippple 3/24 shock, AMS, intubated. 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  Interim History / Subjective:  Remains on 39 NE, MAPs 60s. Getting Albumin now.  Objective   Blood pressure (!) 88/70, pulse (!) 116, temperature 99.1 F (37.3 C), temperature source Oral, resp.  rate (!) 25, height 5\' 8"  (1.727 m), weight 61.7 kg, SpO2 95%.    Vent Mode: PRVC FiO2 (%):  [60 %-100 %] 60 % Set Rate:  [26 bmp] 26 bmp Vt Set:  [550 mL] 550 mL PEEP:  [14 cmH20] 14 cmH20 Plateau Pressure:  [22 cmH20-28 cmH20] 28 cmH20   Intake/Output Summary (Last 24 hours) at 07/08/2023 0918 Last data filed at 07/08/2023 0800 Gross per 24 hour  Intake 4049.21 ml  Output 6065 ml  Net -2015.79 ml   Filed Weights   07/06/23 0458 07/07/23 0428 07/08/23 0500  Weight: 64.1 kg 65 kg 61.7 kg    Examination: General: Adult male, critically ill, in NAD. Neuro: Sedated but opens eyes to voice, follows basic commands. HEENT: [place]/AT. Sclerae anicteric. ETT in place. Cardiovascular: RRR, no M/R/G.  Lungs: Respirations even and unlabored.  CTA bilaterally, No W/R/R. Abdomen: JP drain with dark bloody output minimal, OGT with minimal dark bloody output. Midline incision intact, clean. BS hypoactive. Abd soft, NT/ND.  Musculoskeletal: No gross deformities, no edema.  Skin: Intact, warm, no rashes.    Assessment & Plan:   Ventilator dependence due to shock/near arrest - s/p intubation emergently 3/24. Bilateral pleural effusions L > R - improved on CXR today but unclear why as no diuresis or procedures. - Full vent support. - No SBT/extubation consideration given his degree of shock/vasopressor support. - Bronchial hygiene. - Will assess pleural space with [place] this AM - if moderate fluid will consider thora. - Follow CXR intermittently.  Septic shock with presumed intra-abdominal source status post recent surgical procedure, Whipple: Murky dark fluid noted coming from JP drain as well as oozing from around site of GJ tube insertion. Hgb reassuring after 2u  PRBC 3/24 (also received 2 FFP). - Continue broad spectrum abx, vanc/merrem. - Follow cultures (added 3/25 as none had been sent prior). - Continue NE, goal MAP > 65. - Add Vaso, stress steroids. - Add on echo. - Repeat H/H. -  Surgery following.  AKI. - Fluids. - Follow BMP.  Transaminitis - presumed from shock state. - Trend LFT's.  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 - F/u with surgery and oncology.  [facility] (right click and "Reselect all SmartList Selections" daily)  Per primary   Critical care time: 40 min.    Rutherford Guys, PA - C [facility] For pager details, please see AMION or use Epic chat  After 1900, please call ELINK for cross coverage needs 07/08/2023, 10:08 AM

## 2023-07-08 NOTE — Progress Notes (Signed)
  Echocardiogram 2D Echocardiogram has been performed.  Randy Allen 07/08/2023, 11:14 AM

## 2023-07-08 NOTE — Progress Notes (Incomplete)
 PHARMACY - TOTAL PARENTERAL NUTRITION CONSULT NOTE   Indication:  intolerance to enteral feeding  Patient Measurements: Height: 5\' 8"  (172.7 cm) Weight: 63.5 kg (139 lb 15.9 oz) IBW/kg (Calculated) : 68.4 TPN AdjBW (KG): 60.3 Body mass index is 21.29 kg/m. Usual Weight: 62 kg  Assessment:  63 yo M s/p Whipple procedure on 3/19. Pt has been unable to tolerate any enteral feeds. Pt acutely decompensated, now with renal failure. Pharmacy consulted to dose TPN.   Glucose / Insulin: CBG<180 ; 5u insulin given in last 24h as part of hyperkalemia treatment; on hydrocort 100 BID (3/25> )  Electrolytes: Na 132, K 6.1, Cl 102, CO2 10, CoCa 8.6 Renal: BUN 65, Scr 3.15  Hepatic: AST 857, ALT 466, ALP 295, Tbili 5.0  Intake / Output; MIVF: (0.1 ml/kg/hr) UO ; NGT 450 ml, JP drains 35mL; chest tube 910 mL - net +4.5L this admission  GI Imaging: 3/24 Ab Xray: no abnormality   GI Surgeries / Procedures:  3/19 Whipple procedure  Central access: PICC placed 3/25 TPN start date: 3/26  Nutritional Goals: Goal TPN rate is 65 mL/hr (provides 102 g of protein and 2075 kcals per day)  RD Assessment: Estimated Needs Total Energy Estimated Needs: 2000-2200 Total Protein Estimated Needs: 100-120 grams Total Fluid Estimated Needs: >2 L/day  Current Nutrition:  NPO  Plan:  Start TPN at 30 mL/hr at 1800 to provide 50% of estimated needs Electrolytes in TPN: Na 60 mEq/L, K 0 mEq/L, Ca 5 mEq/L, Mg 0 mEq/L, and Phos 0 mmol/L. Max Acetate Add standard MVI; 1/2 trace elements given Tbili + scleral icterus and remove chromium in anticipation of RRT starting Continue Sensitive q4h SSI and adjust as needed  Fluids per MDs Monitor TPN labs on Mon/Thurs, daily until stable  F/u nephrology plans for RRT   Calton Dach, PharmD, BCCCP Clinical Pharmacist 07/09/2023 9:53 AM  Addendum: -- noted patient is to begin CRRT, updated recs from RD to be included for 3/27 TPN   Calton Dach,  PharmD, BCCCP Clinical Pharmacist 07/09/2023 2:51 PM

## 2023-07-08 NOTE — Plan of Care (Signed)
  Problem: Clinical Measurements: Goal: Diagnostic test results will improve Outcome: Progressing Goal: Respiratory complications will improve Outcome: Progressing Goal: Cardiovascular complication will be avoided Outcome: Progressing   Problem: Coping: Goal: Level of anxiety will decrease Outcome: Progressing   Problem: Pain Managment: Goal: General experience of comfort will improve and/or be controlled Outcome: Progressing   Problem: Clinical Measurements: Goal: Ability to maintain clinical measurements within normal limits will improve Outcome: Not Progressing

## 2023-07-09 ENCOUNTER — Other Ambulatory Visit: Payer: Self-pay

## 2023-07-09 ENCOUNTER — Inpatient Hospital Stay (HOSPITAL_COMMUNITY)

## 2023-07-09 ENCOUNTER — Encounter (HOSPITAL_COMMUNITY): Payer: Self-pay | Admitting: General Surgery

## 2023-07-09 DIAGNOSIS — Z90410 Acquired total absence of pancreas: Secondary | ICD-10-CM

## 2023-07-09 DIAGNOSIS — R6521 Severe sepsis with septic shock: Secondary | ICD-10-CM | POA: Diagnosis not present

## 2023-07-09 DIAGNOSIS — J90 Pleural effusion, not elsewhere classified: Secondary | ICD-10-CM | POA: Diagnosis not present

## 2023-07-09 DIAGNOSIS — Z9049 Acquired absence of other specified parts of digestive tract: Secondary | ICD-10-CM

## 2023-07-09 DIAGNOSIS — A419 Sepsis, unspecified organism: Secondary | ICD-10-CM | POA: Diagnosis not present

## 2023-07-09 DIAGNOSIS — J9601 Acute respiratory failure with hypoxia: Secondary | ICD-10-CM | POA: Diagnosis not present

## 2023-07-09 LAB — GLUCOSE, CAPILLARY
Glucose-Capillary: 101 mg/dL — ABNORMAL HIGH (ref 70–99)
Glucose-Capillary: 103 mg/dL — ABNORMAL HIGH (ref 70–99)
Glucose-Capillary: 103 mg/dL — ABNORMAL HIGH (ref 70–99)
Glucose-Capillary: 131 mg/dL — ABNORMAL HIGH (ref 70–99)
Glucose-Capillary: 147 mg/dL — ABNORMAL HIGH (ref 70–99)
Glucose-Capillary: 37 mg/dL — CL (ref 70–99)
Glucose-Capillary: 49 mg/dL — ABNORMAL LOW (ref 70–99)
Glucose-Capillary: 64 mg/dL — ABNORMAL LOW (ref 70–99)
Glucose-Capillary: 65 mg/dL — ABNORMAL LOW (ref 70–99)
Glucose-Capillary: 68 mg/dL — ABNORMAL LOW (ref 70–99)
Glucose-Capillary: 69 mg/dL — ABNORMAL LOW (ref 70–99)
Glucose-Capillary: 73 mg/dL (ref 70–99)
Glucose-Capillary: 81 mg/dL (ref 70–99)
Glucose-Capillary: 94 mg/dL (ref 70–99)

## 2023-07-09 LAB — RENAL FUNCTION PANEL
Albumin: 2 g/dL — ABNORMAL LOW (ref 3.5–5.0)
Anion gap: 22 — ABNORMAL HIGH (ref 5–15)
BUN: 58 mg/dL — ABNORMAL HIGH (ref 8–23)
CO2: 10 mmol/L — ABNORMAL LOW (ref 22–32)
Calcium: 6.6 mg/dL — ABNORMAL LOW (ref 8.9–10.3)
Chloride: 102 mmol/L (ref 98–111)
Creatinine, Ser: 3.18 mg/dL — ABNORMAL HIGH (ref 0.61–1.24)
GFR, Estimated: 21 mL/min — ABNORMAL LOW (ref >60)
Glucose, Bld: 71 mg/dL (ref 70–99)
Phosphorus: 10.9 mg/dL — ABNORMAL HIGH (ref 2.5–4.6)
Potassium: 7.3 mmol/L (ref 3.5–5.1)
Sodium: 134 mmol/L — ABNORMAL LOW (ref 135–145)

## 2023-07-09 LAB — POCT I-STAT 7, (LYTES, BLD GAS, ICA,H+H)
Acid-base deficit: 14 mmol/L — ABNORMAL HIGH (ref 0.0–2.0)
Bicarbonate: 11.3 mmol/L — ABNORMAL LOW (ref 20.0–28.0)
Calcium, Ion: 0.82 mmol/L — CL (ref 1.15–1.40)
HCT: 24 % — ABNORMAL LOW (ref 39.0–52.0)
Hemoglobin: 8.2 g/dL — ABNORMAL LOW (ref 13.0–17.0)
O2 Saturation: 99 %
Patient temperature: 97.8
Potassium: 7 mmol/L (ref 3.5–5.1)
Sodium: 130 mmol/L — ABNORMAL LOW (ref 135–145)
TCO2: 12 mmol/L — ABNORMAL LOW (ref 22–32)
pCO2 arterial: 24.8 mmHg — ABNORMAL LOW (ref 32–48)
pH, Arterial: 7.264 — ABNORMAL LOW (ref 7.35–7.45)
pO2, Arterial: 149 mmHg — ABNORMAL HIGH (ref 83–108)

## 2023-07-09 LAB — POTASSIUM
Potassium: 6 mmol/L — ABNORMAL HIGH (ref 3.5–5.1)
Potassium: 7.2 mmol/L (ref 3.5–5.1)
Potassium: 7.2 mmol/L (ref 3.5–5.1)
Potassium: 7.2 mmol/L (ref 3.5–5.1)
Potassium: 6.7 mmol/L (ref 3.5–5.1)

## 2023-07-09 LAB — COMPREHENSIVE METABOLIC PANEL
ALT: 466 U/L — ABNORMAL HIGH (ref 0–44)
AST: 857 U/L — ABNORMAL HIGH (ref 15–41)
Albumin: 2.1 g/dL — ABNORMAL LOW (ref 3.5–5.0)
Alkaline Phosphatase: 295 U/L — ABNORMAL HIGH (ref 38–126)
Anion gap: 19 — ABNORMAL HIGH (ref 5–15)
BUN: 62 mg/dL — ABNORMAL HIGH (ref 8–23)
CO2: 11 mmol/L — ABNORMAL LOW (ref 22–32)
Calcium: 7.1 mg/dL — ABNORMAL LOW (ref 8.9–10.3)
Chloride: 101 mmol/L (ref 98–111)
Creatinine, Ser: 2.98 mg/dL — ABNORMAL HIGH (ref 0.61–1.24)
GFR, Estimated: 23 mL/min — ABNORMAL LOW (ref >60)
Glucose, Bld: 61 mg/dL — ABNORMAL LOW (ref 70–99)
Potassium: 6.4 mmol/L (ref 3.5–5.1)
Sodium: 131 mmol/L — ABNORMAL LOW (ref 135–145)
Total Bilirubin: 5 mg/dL — ABNORMAL HIGH (ref 0.0–1.2)
Total Protein: 4.3 g/dL — ABNORMAL LOW (ref 6.5–8.1)

## 2023-07-09 LAB — PROTIME-INR
INR: 9.6 (ref 0.8–1.2)
INR: 11.6 (ref 0.8–1.2)
Prothrombin Time: 77.1 s — ABNORMAL HIGH (ref 11.4–15.2)
Prothrombin Time: 89.4 s — ABNORMAL HIGH (ref 11.4–15.2)

## 2023-07-09 LAB — APTT: aPTT: 65 s — ABNORMAL HIGH (ref 24–36)

## 2023-07-09 LAB — URINALYSIS, COMPLETE (UACMP) WITH MICROSCOPIC
Glucose, UA: 100 mg/dL — AB
Ketones, ur: NEGATIVE mg/dL
Leukocytes,Ua: NEGATIVE
Nitrite: POSITIVE — AB
Protein, ur: >300 mg/dL — AB
Specific Gravity, Urine: 1.025 (ref 1.005–1.030)
pH: 5 (ref 5.0–8.0)

## 2023-07-09 LAB — LACTIC ACID, PLASMA
Lactic Acid, Venous: >9 mmol/L (ref 0.5–1.9)
Lactic Acid, Venous: >9 mmol/L (ref 0.5–1.9)

## 2023-07-09 LAB — CBC
HCT: 33.3 % — ABNORMAL LOW (ref 39.0–52.0)
Hemoglobin: 10.7 g/dL — ABNORMAL LOW (ref 13.0–17.0)
MCH: 31.3 pg (ref 26.0–34.0)
MCHC: 32.1 g/dL (ref 30.0–36.0)
MCV: 97.4 fL (ref 80.0–100.0)
Platelets: 80 10*3/uL — ABNORMAL LOW (ref 150–400)
RBC: 3.42 MIL/uL — ABNORMAL LOW (ref 4.22–5.81)
RDW: 17.9 % — ABNORMAL HIGH (ref 11.5–15.5)
WBC: 24.7 10*3/uL — ABNORMAL HIGH (ref 4.0–10.5)
nRBC: 0.2 % (ref 0.0–0.2)

## 2023-07-09 LAB — AMYLASE, BODY FLUID (OTHER)
Amylase, Body Fluid: 11 U/L
Amylase, Body Fluid: 29 U/L

## 2023-07-09 LAB — BASIC METABOLIC PANEL
Anion gap: 20 — ABNORMAL HIGH (ref 5–15)
BUN: 65 mg/dL — ABNORMAL HIGH (ref 8–23)
CO2: 10 mmol/L — ABNORMAL LOW (ref 22–32)
Calcium: 6.9 mg/dL — ABNORMAL LOW (ref 8.9–10.3)
Chloride: 102 mmol/L (ref 98–111)
Creatinine, Ser: 3.15 mg/dL — ABNORMAL HIGH (ref 0.61–1.24)
GFR, Estimated: 21 mL/min — ABNORMAL LOW (ref >60)
Glucose, Bld: 139 mg/dL — ABNORMAL HIGH (ref 70–99)
Potassium: 6.1 mmol/L — ABNORMAL HIGH (ref 3.5–5.1)
Sodium: 132 mmol/L — ABNORMAL LOW (ref 135–145)

## 2023-07-09 LAB — CYTOLOGY - NON PAP

## 2023-07-09 LAB — CREATININE, URINE, RANDOM: Creatinine, Urine: 66 mg/dL

## 2023-07-09 LAB — VANCOMYCIN, RANDOM: Vancomycin Rm: 23 ug/mL

## 2023-07-09 LAB — HEMOGLOBIN AND HEMATOCRIT, BLOOD
HCT: 27.1 % — ABNORMAL LOW (ref 39.0–52.0)
Hemoglobin: 8.6 g/dL — ABNORMAL LOW (ref 13.0–17.0)

## 2023-07-09 LAB — PHOSPHORUS: Phosphorus: 10 mg/dL — ABNORMAL HIGH (ref 2.5–4.6)

## 2023-07-09 LAB — SODIUM, URINE, RANDOM: Sodium, Ur: 19 mmol/L

## 2023-07-09 LAB — MAGNESIUM: Magnesium: 2.3 mg/dL (ref 1.7–2.4)

## 2023-07-09 MED ORDER — FLUCONAZOLE IN SODIUM CHLORIDE 200-0.9 MG/100ML-% IV SOLN: 200.0000 mg | INTRAVENOUS | Status: DC

## 2023-07-09 MED ORDER — ALBUMIN HUMAN 25 % IV SOLN: 12.5000 g | Freq: Once | INTRAVENOUS | Status: DC

## 2023-07-09 MED ORDER — DEXTROSE 50 % IV SOLN
INTRAVENOUS | Status: AC
  Administered 2023-07-09: 50 mL
  Filled 2023-07-09: qty 50

## 2023-07-09 MED ORDER — HEPARIN SODIUM (PORCINE) 1000 UNIT/ML DIALYSIS
1000.0000 [IU] | INTRAMUSCULAR | Status: DC | PRN
  Administered 2023-07-09: 2400 [IU] via INTRAVENOUS_CENTRAL
  Filled 2023-07-09 (×2): qty 6

## 2023-07-09 MED ORDER — TRACE MINERALS CU-MN-SE-ZN 300-55-60-3000 MCG/ML IV SOLN
INTRAVENOUS | Status: DC
  Filled 2023-07-09: qty 316.8

## 2023-07-09 MED ORDER — SODIUM CHLORIDE 0.9% IV SOLUTION: Freq: Once | INTRAVENOUS | Status: AC

## 2023-07-09 MED ORDER — INSULIN ASPART 100 UNIT/ML IJ SOLN
10.0000 [IU] | Freq: Once | INTRAMUSCULAR | Status: AC
  Administered 2023-07-09: 10 [IU] via INTRAVENOUS

## 2023-07-09 MED ORDER — SODIUM BICARBONATE 8.4 % IV SOLN
INTRAVENOUS | Status: DC
  Filled 2023-07-09: qty 150
  Filled 2023-07-09: qty 1000

## 2023-07-09 MED ORDER — SODIUM CHLORIDE 0.9 % IV BOLUS
2000.0000 mL | Freq: Once | INTRAVENOUS | Status: AC
  Administered 2023-07-09: 2000 mL via INTRAVENOUS

## 2023-07-09 MED ORDER — ALBUTEROL SULFATE (2.5 MG/3ML) 0.083% IN NEBU: 10.0000 mg | INHALATION_SOLUTION | Freq: Once | RESPIRATORY_TRACT | Status: DC

## 2023-07-09 MED ORDER — DEXTROSE 50 % IV SOLN: 12.5000 g | Freq: Once | INTRAVENOUS | Status: AC

## 2023-07-09 MED ORDER — DEXTROSE 50 % IV SOLN
12.5000 g | Freq: Once | INTRAVENOUS | Status: AC
  Administered 2023-07-09: 12.5 g via INTRAVENOUS

## 2023-07-09 MED ORDER — VITAMIN K1 10 MG/ML IJ SOLN
10.0000 mg | Freq: Once | INTRAVENOUS | Status: AC
  Administered 2023-07-10: 10 mg via INTRAVENOUS
  Filled 2023-07-09: qty 1

## 2023-07-09 MED ORDER — SODIUM POLYSTYRENE SULFONATE 15 GM/60ML CO SUSP
15.0000 g | Freq: Once | Status: DC
  Filled 2023-07-09: qty 60

## 2023-07-09 MED ORDER — PRISMASOL BGK 0/2.5 32-2.5 MEQ/L EC SOLN: Status: DC

## 2023-07-09 MED ORDER — SODIUM BICARBONATE 8.4 % IV SOLN
50.0000 meq | Freq: Once | INTRAVENOUS | Status: AC
  Administered 2023-07-09: 50 meq via INTRAVENOUS

## 2023-07-09 MED ORDER — CALCIUM GLUCONATE-NACL 1-0.675 GM/50ML-% IV SOLN
1.0000 g | Freq: Once | INTRAVENOUS | Status: AC
  Administered 2023-07-09: 1000 mg via INTRAVENOUS
  Filled 2023-07-09: qty 50

## 2023-07-09 MED ORDER — PRISMASOL BGK 2/3.5 32-2-3.5 MEQ/L EC SOLN: Status: DC

## 2023-07-09 MED ORDER — LEVOTHYROXINE SODIUM 100 MCG/5ML IV SOLN
50.0000 ug | Freq: Every day | INTRAVENOUS | Status: DC
Start: 2023-07-09 — End: 2023-07-09

## 2023-07-09 MED ORDER — ALBUTEROL SULFATE (2.5 MG/3ML) 0.083% IN NEBU: 2.5000 mg | INHALATION_SOLUTION | Freq: Once | RESPIRATORY_TRACT | Status: DC

## 2023-07-09 MED ORDER — SODIUM BICARBONATE 8.4 % IV SOLN
INTRAVENOUS | Status: AC
  Filled 2023-07-09: qty 50

## 2023-07-09 MED ORDER — CHLORHEXIDINE GLUCONATE CLOTH 2 % EX PADS
6.0000 | MEDICATED_PAD | Freq: Every day | CUTANEOUS | Status: DC
  Administered 2023-07-10: 6 via TOPICAL

## 2023-07-09 MED ORDER — DEXTROSE 50 % IV SOLN: 25.0000 g | INTRAVENOUS | Status: AC

## 2023-07-09 MED ORDER — SODIUM CHLORIDE 0.9% FLUSH
10.0000 mL | Freq: Three times a day (TID) | INTRAVENOUS | Status: DC
  Administered 2023-07-09 – 2023-07-10 (×3): 10 mL via INTRAPLEURAL

## 2023-07-09 MED ORDER — SODIUM CHLORIDE 0.45 % IV SOLN
INTRAVENOUS | Status: DC
  Filled 2023-07-09: qty 75

## 2023-07-09 MED ORDER — SODIUM ZIRCONIUM CYCLOSILICATE 10 G PO PACK
10.0000 g | PACK | Freq: Two times a day (BID) | ORAL | Status: AC
  Administered 2023-07-09 (×2): 10 g
  Filled 2023-07-09 (×2): qty 1

## 2023-07-09 MED ORDER — DEXTROSE 50 % IV SOLN
INTRAVENOUS | Status: AC
  Administered 2023-07-09: 12.5 g via INTRAVENOUS
  Filled 2023-07-09: qty 50

## 2023-07-09 MED ORDER — DEXTROSE 50 % IV SOLN
50.0000 mL | Freq: Once | INTRAVENOUS | Status: AC
  Administered 2023-07-09: 50 mL via INTRAVENOUS
  Filled 2023-07-09: qty 50

## 2023-07-09 MED ORDER — SODIUM BICARBONATE 8.4 % IV SOLN
50.0000 meq | Freq: Once | INTRAVENOUS | Status: AC
  Administered 2023-07-09: 50 meq via INTRAVENOUS
  Filled 2023-07-09: qty 50

## 2023-07-09 MED ORDER — FLUCONAZOLE IN SODIUM CHLORIDE 400-0.9 MG/200ML-% IV SOLN
400.0000 mg | INTRAVENOUS | Status: DC
  Administered 2023-07-10: 400 mg via INTRAVENOUS
  Filled 2023-07-09: qty 200

## 2023-07-09 MED ORDER — ALBUTEROL SULFATE (2.5 MG/3ML) 0.083% IN NEBU
10.0000 mg | INHALATION_SOLUTION | Freq: Once | RESPIRATORY_TRACT | Status: AC
  Administered 2023-07-09: 10 mg via RESPIRATORY_TRACT
  Filled 2023-07-09: qty 12

## 2023-07-09 MED ORDER — LACTATED RINGERS IV BOLUS: 2000.0000 mL | Freq: Once | INTRAVENOUS | Status: DC

## 2023-07-09 MED ORDER — FUROSEMIDE 10 MG/ML IJ SOLN: 60.0000 mg | Freq: Once | INTRAMUSCULAR | Status: DC

## 2023-07-09 MED ORDER — STERILE WATER FOR INJECTION IV SOLN
INTRAVENOUS | Status: DC
  Filled 2023-07-09: qty 1000

## 2023-07-09 MED ORDER — SODIUM CHLORIDE 0.9 % IV SOLN
1.0000 g | Freq: Three times a day (TID) | INTRAVENOUS | Status: DC
  Administered 2023-07-09 – 2023-07-10 (×2): 1 g via INTRAVENOUS
  Filled 2023-07-09 (×2): qty 20

## 2023-07-09 MED ORDER — ALBUMIN HUMAN 25 % IV SOLN
50.0000 g | Freq: Once | INTRAVENOUS | Status: AC
  Administered 2023-07-09: 50 g via INTRAVENOUS
  Filled 2023-07-09: qty 200

## 2023-07-09 MED ORDER — SODIUM BICARBONATE 8.4 % IV SOLN
100.0000 meq | Freq: Once | INTRAVENOUS | Status: AC
  Administered 2023-07-09: 100 meq via INTRAVENOUS
  Filled 2023-07-09: qty 50

## 2023-07-09 MED ORDER — DEXTROSE 50 % IV SOLN
1.0000 | Freq: Once | INTRAVENOUS | Status: AC
  Administered 2023-07-09: 50 mL via INTRAVENOUS
  Filled 2023-07-09: qty 50

## 2023-07-09 MED ORDER — FLUCONAZOLE IN SODIUM CHLORIDE 400-0.9 MG/200ML-% IV SOLN
400.0000 mg | Freq: Once | INTRAVENOUS | Status: AC
  Administered 2023-07-09: 400 mg via INTRAVENOUS
  Filled 2023-07-09: qty 200

## 2023-07-09 MED ORDER — INSULIN ASPART 100 UNIT/ML IV SOLN
5.0000 [IU] | Freq: Once | INTRAVENOUS | Status: AC
  Administered 2023-07-09: 5 [IU] via INTRAVENOUS

## 2023-07-09 MED ORDER — ALBUMIN HUMAN 25 % IV SOLN
25.0000 g | Freq: Once | INTRAVENOUS | Status: AC
  Administered 2023-07-09: 12.5 g via INTRAVENOUS
  Filled 2023-07-09: qty 100

## 2023-07-09 NOTE — Plan of Care (Signed)
 Spoke with surgery attending and extender this afternoon.  Discussed with surgery that with his potassium trends, I am concerned about possible ischemia.  They ordered lactic acid which is elevated.     I called ICU RN confirmed that he is on CRRT now.  This was started around 5pm.  I am changing him to zero K fluids.    Sending repeat potassium.  I have given them my cell phone number to call me with the next potassium though I am concerned about our ability to medically manage this through CRRT   Estanislado Emms, MD 7:24 PM 07/09/2023

## 2023-07-09 NOTE — Procedures (Signed)
 Central Venous Catheter Insertion Procedure Note  Randy Allen  [id number]  [birth year]  Date:07/09/23  Time:3:27 PM   Provider Performing:Brooke Lodema Hong   Procedure: Insertion of Non-tunneled Central Venous Hemodialysis Catheter(36556)with US guidance (95621)    Indication(s) Hemodialysis  Consent Risks of the procedure as well as the alternatives and risks of each were explained to the patient and/or caregiver.  Consent for the procedure was obtained and is signed in the bedside chart  Anesthesia Topical only with 1% lidocaine   Timeout Verified patient identification, verified procedure, site/side was marked, verified correct patient position, special equipment/implants available, medications/allergies/relevant history reviewed, required imaging and test results available.  Sterile Technique Maximal sterile technique including full sterile barrier drape, hand hygiene, sterile gown, sterile gloves, mask, hair covering, sterile ultrasound probe cover (if used).  Procedure Description Area of catheter insertion was cleaned with chlorhexidine and draped in sterile fashion.   With real-time ultrasound guidance a 15 cm trialysis HD catheter was placed into the right internal jugular vein.  Nonpulsatile blood flow and easy flushing noted in all ports.  The catheter was sutured in place, biopatch, and sterile dressing applied.    Complications/Tolerance None; patient tolerated the procedure well. Chest X-ray is ordered to verify placement for internal jugular or subclavian cannulation.  Chest x-ray is not ordered for femoral cannulation.  EBL Minimal  Specimen(s) None     Randy Boyer, MSN, AG-ACNP-BC [facility] 07/09/2023, 3:28 PM  See Amion for pager If no response to pager , please call [phone] until 7pm After 7:00 pm call Elink  [phone]

## 2023-07-09 NOTE — Progress Notes (Signed)
 7 Days Post-Op    Subjective: Low UOP overnight and creatinine/K is up.  Also vaso added yesterday. TNA started yesterday.    Objective: Vital signs in last 24 hours: Temp:  [98.1 F (36.7 C)-99.8 F (37.7 C)] 99.8 F (37.7 C) (03/26 0000) Pulse Rate:  [100-116] 100 (03/26 0200) Resp:  [18-30] 26 (03/26 0804) BP: (66-156)/(45-121) 110/58 (03/26 0600) SpO2:  [94 %-100 %] 100 % (03/26 0200) Arterial Line BP: (93-131)/(51-69) 120/57 (03/26 0600) FiO2 (%):  [45 %-50 %] 45 % (03/26 0804) Weight:  [63.5 kg] 63.5 kg (03/26 0500) Last BM Date : 07/07/23  Intake/Output from previous day: 03/25 0701 - 03/26 0700 In: 4812.9 [I.V.:4105.8; IV Piggyback:707.1] Out: 1620 [Urine:225; Emesis/NG output:450; Drains:35; Chest Tube:910] Intake/Output this shift: No intake/output data recorded.  Gen: Sedated on ventilator Resp: Comfortable and in sync with the ventilator CV regular, tachycardic, heart rate lower than yesterday Abd:  soft, non distended, J-tube in place.  Right lateral drain looks like old blood that may be bile-stained.  Medial drain looks like old blood. Some hematoma settling out in the lower abdominal wall.  Ext:  warm, well perfused.  No edema. Scds in place.   Lab Results: CBC  Recent Labs    07/08/23 0304 07/08/23 1031 07/09/23 0329  WBC 5.6  --  24.7*  HGB 14.0 12.6* 10.7*  HCT 42.0 38.7* 33.3*  PLT 142*  --  80*   BMET Recent Labs    07/09/23 0329 07/09/23 0557  NA 131* 132*  K 6.4* 6.1*  CL 101 102  CO2 11* 10*  GLUCOSE 61* 139*  BUN 62* 65*  CREATININE 2.98* 3.15*  CALCIUM 7.1* 6.9*   PT/INR Recent Labs    07/07/23 1717 07/08/23 0521  LABPROT 22.5* 20.5*  INR 2.0* 1.7*   ABG Recent Labs    07/07/23 0646 07/07/23 1744  PHART 7.483* 7.359  HCO3 24.1 16.9*    Studies/Results: DG Chest Port 1 View Result Date: 07/08/2023 CLINICAL DATA:  Chest tube placement EXAM: PORTABLE CHEST 1 VIEW COMPARISON:  07/08/2023 FINDINGS: Interval  placement of left basilar chest tube with decrease left pleural effusion. No visible pneumothorax. Minimal residual left basilar atelectasis. Right lung clear. Heart mediastinal contours within normal limits. Left Port-A-Cath and NG tube are unchanged. IMPRESSION: Interval placement of left chest tube with decreasing left pleural effusion. Minimal residual left base atelectasis. No pneumothorax. Electronically Signed   By: Charlett Nose M.D.   On: 07/08/2023 14:33   ECHOCARDIOGRAM COMPLETE Result Date: 07/08/2023    ECHOCARDIOGRAM REPORT   Patient Name:   Randy Allen Date of Exam: 07/08/2023 Medical Rec #:  [id number]    Height:       68.0 in Accession #:    [id number]   Weight:       136.0 lb Date of Birth:  1960/04/16   BSA:          1.735 m Patient Age:    62 years     BP:           96/63 mmHg Patient Gender: M            HR:           109 bpm. Exam Location:  Inpatient Procedure: 2D Echo, Cardiac Doppler, Color Doppler and Intracardiac            Opacification Agent (Both Spectral and Color Flow Doppler were            utilized during  procedure). STAT ECHO Indications:    Shock  History:        Patient has prior history of Echocardiogram examinations, most                 recent 06/30/2023. Aortic Valve Disease; Risk                 Factors:Hypertension. Cancer. Bicudspid aortic valve. Ascending                 aneurysm.  Sonographer:    Sheralyn Boatman RDCS Referring Phys: [id number] [facility]  Sonographer Comments: Technically difficult study due to poor echo windows and echo performed with patient supine and on artificial respirator. IMPRESSIONS  1. Large pleural effusion in the left lateral region. There is secondary, compressive, atelectasis.  2. Stat echo, unable to get on axis gradient. Unclear degree of aortic stenosis. The aortic valve was not well visualized. Aortic valve regurgitation is trivial.  3. Left ventricular ejection fraction, by estimation, is 70 to 75%. The left ventricle has hyperdynamic  function. Left ventricular endocardial border not optimally defined to evaluate regional wall motion. Left ventricular diastolic parameters are indeterminate.  4. Aortic dilatation noted. There is moderate dilatation of the ascending aorta, measuring 46 mm.  5. The mitral valve was not well visualized. No evidence of mitral valve regurgitation.  6. The inferior vena cava is normal in size with greater than 50% respiratory variability, suggesting right atrial pressure of 3 mmHg.  7. Right ventricular systolic function was not well visualized. The right ventricular size is moderately enlarged. Comparison(s): Prior images reviewed side by side. Technically challenging study. Aortic stenosis is likely unchanged from recent echo. Pleural effusion is large and new. FINDINGS  Left Ventricle: Left ventricular ejection fraction, by estimation, is 70 to 75%. The left ventricle has hyperdynamic function. Left ventricular endocardial border not optimally defined to evaluate regional wall motion. Definity contrast agent was given IV to delineate the left ventricular endocardial borders. Strain was performed and the global longitudinal strain is indeterminate. The left ventricular internal cavity size was small. There is no left ventricular hypertrophy. Left ventricular diastolic parameters are indeterminate. Right Ventricle: The right ventricular size is moderately enlarged. Right vetricular wall thickness was not well visualized. Right ventricular systolic function was not well visualized. Left Atrium: Left atrial size was normal in size. Right Atrium: Right atrial size was normal in size. Pericardium: There is no evidence of pericardial effusion. Mitral Valve: The mitral valve was not well visualized. No evidence of mitral valve regurgitation. Tricuspid Valve: The tricuspid valve is normal in structure. Tricuspid valve regurgitation is not demonstrated. No evidence of tricuspid stenosis. Aortic Valve: Stat echo, unable to get  on axis gradient. Unclear degree of aortic stenosis. The aortic valve was not well visualized. Aortic valve regurgitation is trivial. Aortic valve mean gradient measures 6.5 mmHg. Aortic valve peak gradient measures  12.5 mmHg. Aortic valve area, by VTI measures 1.71 cm. Pulmonic Valve: The pulmonic valve was normal in structure. Pulmonic valve regurgitation is not visualized. No evidence of pulmonic stenosis. Aorta: Aortic dilatation noted. There is moderate dilatation of the ascending aorta, measuring 46 mm. Venous: The inferior vena cava is normal in size with greater than 50% respiratory variability, suggesting right atrial pressure of 3 mmHg. IAS/Shunts: The interatrial septum was not well visualized. Additional Comments: 3D was performed not requiring image post processing on an independent workstation and was indeterminate. There is a large pleural effusion in the left lateral  region.  LEFT VENTRICLE PLAX 2D LVIDd:         3.80 cm      Diastology LVIDs:         3.00 cm      LV e' medial:    7.62 cm/s LV PW:         1.15 cm      LV E/e' medial:  5.4 LV IVS:        1.15 cm      LV e' lateral:   7.18 cm/s LVOT diam:     2.10 cm      LV E/e' lateral: 5.7 LV SV:         39 LV SV Index:   22 LVOT Area:     3.46 cm  LV Volumes (MOD) LV vol d, MOD A2C: 73.8 ml LV vol d, MOD A4C: 107.0 ml LV vol s, MOD A2C: 43.8 ml LV vol s, MOD A4C: 54.7 ml LV SV MOD A2C:     30.0 ml LV SV MOD A4C:     107.0 ml LV SV MOD BP:      46.1 ml RIGHT VENTRICLE             IVC RV S prime:     13.70 cm/s  IVC diam: 1.60 cm LEFT ATRIUM             Index        RIGHT ATRIUM          Index LA diam:        2.70 cm 1.56 cm/m   RA Area:     6.12 cm LA Vol (A2C):   18.5 ml 10.66 ml/m  RA Volume:   8.34 ml  4.81 ml/m LA Vol (A4C):   9.2 ml  5.29 ml/m LA Biplane Vol: 13.8 ml 7.95 ml/m  AORTIC VALVE AV Area (Vmax):    1.72 cm AV Area (Vmean):   1.74 cm AV Area (VTI):     1.71 cm AV Vmax:           176.50 cm/s AV Vmean:          114.250 cm/s  AV VTI:            0.227 m AV Peak Grad:      12.5 mmHg AV Mean Grad:      6.5 mmHg LVOT Vmax:         87.40 cm/s LVOT Vmean:        57.300 cm/s LVOT VTI:          0.112 m LVOT/AV VTI ratio: 0.49  AORTA Ao Asc diam: 4.60 cm MITRAL VALVE MV Area (PHT): 4.68 cm    SHUNTS MV Decel Time: 162 msec    Systemic VTI:  0.11 m MV E velocity: 40.90 cm/s  Systemic Diam: 2.10 cm MV A velocity: 61.30 cm/s MV E/A ratio:  0.67 Riley Lam MD Electronically signed by Riley Lam MD Signature Date/Time: 07/08/2023/12:51:59 PM    Final    [place] EKG SITE RITE Result Date: 07/08/2023 If Site Rite image not attached, placement could not be confirmed due to current cardiac rhythm.  Portable Chest xray Result Date: 07/08/2023 CLINICAL DATA:  09811 Respiratory failure (HCC) [phone] EXAM: PORTABLE CHEST 1 VIEW COMPARISON:  07/07/2023 FINDINGS: Interval placement of enteric tube with distal tip extending beyond the inferior margin of the film. Left chest port and endotracheal tube remain appropriately positioned. Normal heart size. Moderate left and trace right  pleural effusions with associated bibasilar opacities. No pneumothorax is identified. IMPRESSION: Moderate left and trace right pleural effusions with associated bibasilar opacities, progressed from prior. Electronically Signed   By: Duanne Guess D.O.   On: 07/08/2023 10:46   DG Abd Portable 1V Result Date: 07/07/2023 CLINICAL DATA:  Check gastric catheter placement EXAM: PORTABLE ABDOMEN - 1 VIEW COMPARISON:  None Available. FINDINGS: Gastric catheter is coiled within the stomach. Postsurgical changes are seen. No free air is noted. Small left effusion is seen. IMPRESSION: Gastric catheter within the stomach. Electronically Signed   By: Alcide Clever M.D.   On: 07/07/2023 22:28   CT ANGIO GI BLEED Addendum Date: 07/07/2023 ADDENDUM REPORT: 07/07/2023 21:26 ADDENDUM: These results were called by telephone at the time of interpretation on 07/07/2023 at 9:25 pm  to provider Dr. Sheliah Hatch, who verbally acknowledged these results. Electronically Signed   By: Darliss Cheney M.D.   On: 07/07/2023 21:26   Result Date: 07/07/2023 CLINICAL DATA:  Status post Whipple with recent anastomotic bleed. Blood in peritoneal cavity. EXAM: CTA ABDOMEN AND PELVIS WITHOUT AND WITH CONTRAST TECHNIQUE: Multidetector CT imaging of the abdomen and pelvis was performed using the standard protocol during bolus administration of intravenous contrast. Multiplanar reconstructed images and MIPs were obtained and reviewed to evaluate the vascular anatomy. RADIATION DOSE REDUCTION: This exam was performed according to the departmental dose-optimization program which includes automated exposure control, adjustment of the mA and/or kV according to patient size and/or use of iterative reconstruction technique. CONTRAST:  OMNIPAQUE IOHEXOL 350 MG/ML SOLN COMPARISON:  CT abdomen and pelvis 05/19/2023. CT angiogram abdomen 08/16/2020. FINDINGS: VASCULAR Aorta: Normal caliber aorta without aneurysm, dissection, vasculitis or significant stenosis. Celiac: Patent without evidence of aneurysm, dissection, vasculitis or significant stenosis. 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 The remaining superior mesenteric artery appears within normal limits. Findings are similar to 2022. Renals: Both renal arteries are patent without evidence of aneurysm, dissection, vasculitis, fibromuscular dysplasia or significant stenosis. IMA: Patent without evidence of aneurysm, dissection, vasculitis or significant stenosis. 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 The external iliac arteries and common iliac arteries appear within normal limits. Right  femoral artery catheter is in place. Proximal Outflow: Bilateral common femoral and visualized portions of the superficial and profunda femoral arteries are patent without evidence of aneurysm, dissection, vasculitis or significant stenosis. Veins: Right common femoral vein catheter is present with distal tip ending in the inferior IVC. Portal vein, splenic vein and superior mesenteric vein are patent. IVC is patent. Review of the MIP images confirms the above findings. NON-VASCULAR Lower chest: Small right and moderate left pleural effusions are present with compressive atelectasis of the bilateral lower lobes. Hepatobiliary: Gallbladder has been surgically removed. There is minimal fluid and air in the gallbladder fossa without discrete fluid collection. No biliary ductal dilatation. No focal liver lesion. Pancreas: Status post Whipple. Pancreatic body and tail are within normal limits. There is a new pancreatic duct stent in place. Spleen: Normal in size without focal abnormality. There is a subcapsular perisplenic fluid collection with some mild wall enhancement measuring up to 1 cm. There is a small amount of air within this collection as it abuts the upper greater curvature of the stomach. Can not  exclude contained perforation of the stomach at this level image 16/53. Adrenals/Urinary Tract: The bladder is decompressed by Foley catheter. There is a subcentimeter cyst in the inferior pole of the left kidney. Otherwise, the kidneys and adrenal glands are within normal limits. Stomach/Bowel: Patient is status post Whipple procedure. There are postsurgical changes in the distal stomach. No active gastrointestinal bleeding identified within the stomach. The stomach is dilated with large air-fluid level. There is also fluid dilatation of the distal esophagus. The duodenal is dilated and fluid-filled measuring up to 3.5 cm percutaneous jejunostomy tube in place. Jejunal loops are distended with fluid. Distal ileal  loops are decompressed. The appendix is within normal limits. There is diffuse colonic wall thickening. No active bleeding identified in the small bowel or colon. Lymphatic: No enlarged lymph nodes are identified. Reproductive: The prostate gland is enlarged. Other: There is a small amount of free fluid throughout the abdomen and pelvis. Two surgical drains are seen in the upper abdomen. There is diffuse body wall edema. Midline skin staples are present. No focal hernia. Musculoskeletal: No fracture is seen. IMPRESSION: 1. No evidence for active gastrointestinal bleeding. 2. 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 3. 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 NON-VASCULAR 1. Moderate-sized subcapsular perisplenic fluid collection with mild wall enhancement. There is a small amount of air within this collection as it abuts the upper greater curvature of the stomach. Can not exclude contained perforation of the stomach at this level. 2. Diffuse gastric and duodenal dilatation with percutaneous jejunostomy tube in place. Findings may represent ileus or obstruction. 3. Diffuse colonic wall thickening worrisome for colitis. 4. Small amount of free fluid throughout the abdomen and pelvis. 5. Diffuse body wall edema. 6. Small right and moderate left pleural effusions with compressive atelectasis of the bilateral lower lobes. Electronically Signed: By: Darliss Cheney M.D. On: 07/07/2023 21:08   DG CHEST PORT 1 VIEW Result Date: 07/07/2023 CLINICAL DATA:  Check endotracheal tube placement EXAM: PORTABLE CHEST 1 VIEW COMPARISON:  07/07/2023 FINDINGS: The tracheal tube is noted in satisfactory position. Left chest wall port is again seen. Lungs are well aerated bilaterally. Slight increased density is noted in the left chest consistent with a posteriorly layering effusion redistributed  when compared with the prior exam. IMPRESSION: Left-sided pleural effusion.  No other focal abnormality is noted. Electronically Signed   By: Alcide Clever M.D.   On: 07/07/2023 19:31   DG Chest Port 1 View Result Date: 07/07/2023 CLINICAL DATA:  Respiratory failure EXAM: PORTABLE CHEST 1 VIEW COMPARISON:  Chest x-ray 07/07/2023 FINDINGS: Left chest port catheter tip projects over the mid SVC. The heart is mildly enlarged. There is a moderate-sized left pleural effusion which has increased. There is no focal lung consolidation or pneumothorax. There is likely gaseous distention of the upper esophagus. IMPRESSION: 1. Increasing moderate left pleural effusion. 2. Mild cardiomegaly. 3. Likely gaseous distention of the upper esophagus. Electronically Signed   By: Darliss Cheney M.D.   On: 07/07/2023 19:29   DG Abd Portable 1V Result Date: 07/07/2023 CLINICAL DATA:  Abdominal pain EXAM: PORTABLE ABDOMEN - 1 VIEW COMPARISON:  None Available. FINDINGS: Scattered large and small bowel gas is noted. Postsurgical changes are seen. No obstructive changes are noted. No free air is seen. IMPRESSION: No acute abnormality noted. Electronically Signed   By: Eulah Pont.D.  On: 07/07/2023 19:28    Anti-infectives: Anti-infectives (From admission, onward)    Start     Dose/Rate Route Frequency Ordered Stop   07/04/2023 0930  fluconazole (DIFLUCAN) IVPB 200 mg       Placed in "Followed by" Linked Group   200 mg 100 mL/hr over 60 Minutes Intravenous Every 24 hours 07/09/23 0841     07/09/23 0930  fluconazole (DIFLUCAN) IVPB 400 mg       Placed in "Followed by" Linked Group   400 mg 100 mL/hr over 120 Minutes Intravenous  Once 07/09/23 0841     07/08/23 1200  vancomycin (VANCOCIN) IVPB 1000 mg/200 mL premix        1,000 mg 200 mL/hr over 60 Minutes Intravenous Every 24 hours 07/07/23 2030     07/08/23 0800  meropenem (MERREM) 1 g in sodium chloride 0.9 % 100 mL IVPB        1 g 200 mL/hr over 30 Minutes  Intravenous Every 12 hours 07/07/23 2030     07/07/23 2300  vancomycin (VANCOCIN) IVPB 1000 mg/200 mL premix  Status:  Discontinued        1,000 mg 200 mL/hr over 60 Minutes Intravenous Every 12 hours 07/07/23 0954 07/07/23 2030   07/07/23 1800  meropenem (MERREM) 1 g in sodium chloride 0.9 % 100 mL IVPB  Status:  Discontinued        1 g 200 mL/hr over 30 Minutes Intravenous Every 8 hours 07/07/23 1758 07/07/23 2030   07/07/23 1045  ceFEPIme (MAXIPIME) 2 g in sodium chloride 0.9 % 100 mL IVPB  Status:  Discontinued        2 g 200 mL/hr over 30 Minutes Intravenous Every 8 hours 07/07/23 0954 07/07/23 1750   07/07/23 1045  vancomycin (VANCOREADY) IVPB 1250 mg/250 mL        1,250 mg 166.7 mL/hr over 90 Minutes Intravenous  Once 07/07/23 0954 07/07/23 1309   07/02/23 2200  ceFAZolin (ANCEF) IVPB 2g/100 mL premix        2 g 200 mL/hr over 30 Minutes Intravenous Every 8 hours 07/02/23 1814 07/02/23 2240   07/02/23 0730  ceFAZolin (ANCEF) IVPB 2g/100 mL premix        2 g 200 mL/hr over 30 Minutes Intravenous On call to O.R. 07/02/23 0719 07/02/23 1322      Pathology 07/02/23 A. LIVER, BIOPSY:  - Benign liver parenchyma, negative for carcinoma   B. WHIPPLE PROCEDURE WITH GALLBLADDER AND STENT:  - Foci of residual invasive poorly differentiated adenocarcinoma  - Distal pancreatic resection margin is focally positive for carcinoma,  see comment  - Carcinoma focally invades into peripancreatic soft tissue  - No evidence of lymphovascular invasion  - Seven lymph nodes, negative for carcinoma (0/7)  - Gallbladder with chronic cholecystitis  - See oncology table   C. OMENTUM, RESECTION:  - Portion of omentum, negative for carcinoma    Assessment/Plan: Adenocarcinoma head of pancreas, cT2N0M0, s/p neoadjuvant chemotherapy POD 6 S/p whipple; [id number]  Septic shock is currently overriding issue.  - merropenem and vancomycin d3. Adding diflucan.  - appears to have developed infected  hematoma. -? Panc leak with infected hematoma.  Deflated 1 mL saline from J tube balloon in case that was causing any proximal obstructive issue.   - Continue levophed, which is lower today, but vasopressin added.  JP drains for bilirubin and amylase- this is pending.   VDRF - on vent per CCM. Left pleural effusion - chest tube placed by  CCM, gram stain neg. PEEP up to 8.   FEN-  Holding tube feeds given pressor requirement TPN  Pain/sedation - fentanyl gtt. PRN versed.   Hyperglycemia  - SSI.    GI Improved t bili, but transaminases and alk phos up.  Will follow.   VTE ppx- TEDS, holding pharmacologic ppx again due to plts 80k Dispo:  ICU, do not remove foley/   LOS: 7 days   Maudry Diego, MD, FACS, FSSO Surgical Oncology and General Surgery [facility], [place] [phone] for weekday/non holidays Check [email or web address] for coverage night/weekend/holidays   07/09/2023

## 2023-07-09 NOTE — Consult Note (Addendum)
 [facility] Renal Consultation Note  Requesting MD: Dr. Donell Beers Indication for Consultation: AKI  HPI:  Randy Allen is a 63 y.o. male with medical hx of HTN, HLD, Hypothyroidism and adenocarcinoma of the pancreatic head who presented for whipple procedure this hospitalization after 8 rounds of chemotherapy. His surgery was on 03/19. His post-op course was completed by anastomotic bleeding and hematemesis. His course was further complicated by hypotension, tachycardia and tachypnea and he was moved to the ICU. He was intubated in the ICU as there was concern for airway protection. Currently in the ICU, he is on multiple pressors including levophed and vasopressin. Concern is for septic vs hemorrhagic shock. His renal function has declined in the recent days and our team has been consulted to manage his AKI. Pt doesn't have any renal insufficiency at baseline. 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   Creatinine  Date/Time Value Ref Range Status  06/24/2023 01:08 PM 0.78 0.61 - 1.24 mg/dL Final  16/01/9603 54:09 AM 0.85 0.61 - 1.24 mg/dL Final  81/19/1478 29:56 AM 0.90 0.61 - 1.24 mg/dL Final  21/30/8657 84:69 AM 0.80 0.61 - 1.24 mg/dL Final  62/95/2841 32:44 AM 1.00 0.61 - 1.24 mg/dL Final  04/17/7251 66:44 AM 0.82 0.61 - 1.24 mg/dL Final  03/47/4259 56:38 AM 0.83 0.61 - 1.24 mg/dL Final  75/64/3329 51:88 AM 0.84 0.61 - 1.24 mg/dL Final  41/66/0630 16:01 AM 0.86 0.61 - 1.24 mg/dL Final  09/32/3557 32:20 AM 0.79 0.61 - 1.24 mg/dL Final  25/42/7062 37:62 AM 0.91 0.61 - 1.24 mg/dL Final  83/15/1761 60:73 PM 0.99 0.61 - 1.24 mg/dL Final   Creat  Date/Time Value Ref Range Status  01/13/2023 04:00 PM 1.21 0.70 - 1.35 mg/dL Final  71/09/2692 85:46 PM 0.90 0.70 - 1.35 mg/dL Final  27/06/5007 38:18 AM 0.94 0.70 - 1.35 mg/dL Final  29/93/7169 67:89 AM  1.03 0.70 - 1.35 mg/dL Final  38/01/1750 02:58 PM 1.13 0.70 - 1.35 mg/dL Final  52/77/8242 35:36 AM 1.10 0.70 - 1.35 mg/dL Final  14/43/1540 08:67 PM 1.00 0.70 - 1.30 mg/dL Final  61/95/0932 67:12 AM 0.98 0.70 - 1.33 mg/dL Final    Comment:    For patients >89 years of age, the reference limit for Creatinine is approximately 13% higher for people identified as African-American. .   04/11/2020 04:23 PM 1.04 0.70 - 1.33 mg/dL Final    Comment:    For patients >44 years of age, the reference limit for Creatinine is approximately 13% higher for people identified as African-American. Marland Kitchen   01/10/2020 04:38 PM 1.01 0.70 - 1.33 mg/dL Final    Comment:    For patients >28 years of age, the reference limit for Creatinine is approximately 13% higher for people identified as African-American. Marland Kitchen   10/06/2019 04:32 PM 1.23 0.70 - 1.33 mg/dL Final    Comment:    For patients >65 years of age, the reference limit for Creatinine is approximately 13% higher for people identified as African-American. .   06/24/2019 08:52 AM 1.05 0.70 - 1.33 mg/dL Final    Comment:    For patients >1 years of age, the reference limit for Creatinine is approximately 13% higher for people identified as African-American. Marland Kitchen   06/04/2018 09:40 AM 1.00 0.70 - 1.33 mg/dL Final    Comment:    For patients >57 years of age, the reference limit  for Creatinine is approximately 13% higher for people identified as African-American. Marland Kitchen   05/20/2017 09:44 AM 0.99 0.70 - 1.33 mg/dL Final    Comment:    For patients >22 years of age, the reference limit for Creatinine is approximately 13% higher for people identified as African-American. Marland Kitchen   07/17/2016 05:26 PM 0.99 0.70 - 1.33 mg/dL Final    Comment:      For patients > or = 63 years of age: The upper reference limit for Creatinine is approximately 13% higher for people identified as African-American.     04/18/2016 11:09 AM 1.05 0.70 - 1.33 mg/dL Final     Comment:      For patients > or = 63 years of age: The upper reference limit for Creatinine is approximately 13% higher for people identified as African-American.     03/08/2015 11:44 AM 0.87 0.70 - 1.33 mg/dL Final  16/01/9603 54:09 PM 0.97 0.70 - 1.33 mg/dL Final  81/19/1478 29:56 PM 1.01 0.50 - 1.35 mg/dL Final  21/30/8657 84:69 PM 0.98 0.50 - 1.35 mg/dL Final  62/95/2841 32:44 AM 0.80 0.50 - 1.35 mg/dL Final  04/17/7251 66:44 PM 0.99 0.50 - 1.35 mg/dL Final  03/47/4259 56:38 PM 1.04 0.50 - 1.35 mg/dL Final  75/64/3329 51:88 AM 0.95 0.50 - 1.35 mg/dL Final   Creatinine, Ser  Date/Time Value Ref Range Status  07/09/2023 05:57 AM 3.15 (H) 0.61 - 1.24 mg/dL Final  41/66/0630 16:01 AM 2.98 (H) 0.61 - 1.24 mg/dL Final  09/32/3557 32:20 AM 1.79 (H) 0.61 - 1.24 mg/dL Final  25/42/7062 37:62 PM 1.46 (H) 0.61 - 1.24 mg/dL Final  83/15/1761 60:73 AM 0.75 0.61 - 1.24 mg/dL Final  71/09/2692 85:46 AM 0.67 0.61 - 1.24 mg/dL Final  27/06/5007 38:18 AM 0.65 0.61 - 1.24 mg/dL Final  29/93/7169 67:89 AM 0.71 0.61 - 1.24 mg/dL Final  38/01/1750 02:58 AM 0.89 0.61 - 1.24 mg/dL Final  52/77/8242 35:36 PM 0.73 0.61 - 1.24 mg/dL Final  14/43/1540 08:67 PM 1.02 0.61 - 1.24 mg/dL Final  61/95/0932 67:12 AM 0.90 0.61 - 1.24 mg/dL Final  45/80/9983 38:25 PM 1.00 0.40 - 1.50 mg/dL Final  05/39/7673 41:93 AM 0.94 0.61 - 1.24 mg/dL Final  79/05/4095 35:32 PM 0.97 0.61 - 1.24 mg/dL Final  99/24/2683 41:96 AM 1.04 0.61 - 1.24 mg/dL Final  22/29/7989 21:19 PM 0.99 0.61 - 1.24 mg/dL Final  41/74/0814 48:18 PM 1.15 0.40 - 1.50 mg/dL Final  56/31/4970 26:37 AM 1.06 0.76 - 1.27 mg/dL Final     PMHx:   Past Medical History:  Diagnosis Date   AAA (abdominal aortic aneurysm) without rupture [facility]) 2021   cardiothoracic surgeon--- dr s. 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 [facility])    s/p intervention ERCP/ sphincterotomy stent placement 01-18-2023   Dental crowns  present    Gastric ulcer without hemorrhage or perforation 02/06/2023   per EGD in epic   Generalized abdominal pain    w/ back pain   GERD (gastroesophageal reflux disease)    Heart murmur    states need for antibiotics prior to dental procedures;   Hepatitis    Hep A when 63 years old   Hiatal hernia 02/06/2023   per EGD in epic 3 cm   History of hepatitis A    age 23   History of MRSA infection 2015   right great toe   Hyperlipidemia    Hypertension    states under control  with med., has been on med. > 10 yr.   Hypothyroidism    followed by pcp   Malignant neoplasm of head of pancreas [facility]) 01/2023   oncologist--- dr sherrill/  GI-- Dr Perry/  surgeon-- Dr Donell Beers;  dx jaundice 01-09-2023, obstructive 01-18-2023 s/p EGD/ biliary brushings/ ERCP sphincterotomy / stent placement;  malignant cells found   Moderate aortic valve stenosis 2021   cardiologist--- dr Lovina Reach;   bicuspid;  last echo in epic 06-21-2022 mild AR,  MG 21.5 mmHg, AVA 1.2 cm^2, ef 60-65%   Peripheral vascular disease (HCC)    AAA   Pneumonia    Seasonal allergies    Severe protein-calorie malnutrition (HCC)    Vitamin D deficiency    Wears glasses     Past Surgical History:  Procedure Laterality Date   BILIARY BRUSHING  01/18/2023   Procedure: BILIARY BRUSHING;  Surgeon: Hilarie Fredrickson, MD;  Location: [facility] ENDOSCOPY;  Service: Gastroenterology;;   BILIARY STENT PLACEMENT  01/18/2023   Procedure: BILIARY STENT PLACEMENT;  Surgeon: Hilarie Fredrickson, MD;  Location: [facility] ENDOSCOPY;  Service: Gastroenterology;;   BIOPSY  02/06/2023   Procedure: BIOPSY;  Surgeon: Lemar Lofty., MD;  Location: [facility] ENDOSCOPY;  Service: Gastroenterology;;   COLONOSCOPY WITH PROPOFOL  10/21/2013   ENDOSCOPIC RETROGRADE CHOLANGIOPANCREATOGRAPHY (ERCP) WITH PROPOFOL N/A 01/18/2023   Procedure: ENDOSCOPIC RETROGRADE CHOLANGIOPANCREATOGRAPHY (ERCP) WITH PROPOFOL;  Surgeon: Hilarie Fredrickson, MD;  Location: [facility] ENDOSCOPY;  Service:  Gastroenterology;  Laterality: N/A;   ESOPHAGOGASTRODUODENOSCOPY N/A 02/06/2023   Procedure: ESOPHAGOGASTRODUODENOSCOPY (EGD);  Surgeon: Lemar Lofty., MD;  Location: [facility] ENDOSCOPY;  Service: Gastroenterology;  Laterality: N/A;   EUS N/A 02/06/2023   Procedure: UPPER ENDOSCOPIC ULTRASOUND (EUS) RADIAL;  Surgeon: Lemar Lofty., MD;  Location: [facility] ENDOSCOPY;  Service: Gastroenterology;  Laterality: N/A;   HYDROCELE EXCISION Left 2005   @[email or web address]   by dr r. davis   JEJUNOSTOMY N/A 07/02/2023   Procedure: Rance Muir TUBE;  Surgeon: [name], MD;  Location: [facility] OR;  Service: General;  Laterality: N/A;   LAPAROSCOPY N/A 07/02/2023   Procedure: LAPAROSCOPY, DIAGNOSTIC;  Surgeon: [name], MD;  Location: [facility] OR;  Service: General;  Laterality: N/A;  GENERAL AND EPIDURAL 420   MASS EXCISION Right 04/26/2015   Procedure: EXCISION MASS NECK AND RIGHT SHOULDER ;  Surgeon: Luretha Murphy, MD;  Location: [facility];  Service: General;  Laterality: Right;   PORTACATH PLACEMENT N/A 02/12/2023   Procedure: INSERTION PORT-A-CATH;  Surgeon: [name], MD;  Location: [facility];  Service: General;  Laterality: N/A;   SPHINCTEROTOMY  01/18/2023   Procedure: SPHINCTEROTOMY;  Surgeon: Hilarie Fredrickson, MD;  Location: [facility] ENDOSCOPY;  Service: Gastroenterology;;   STRABISMUS SURGERY Right 1966   WHIPPLE PROCEDURE N/A 07/02/2023   Procedure: WHIPPLE PROCEDURE;  Surgeon: [name], MD;  Location[facility] OR;  Service: General;  Laterality: N/A;    Family Hx:  Family History  Problem Relation Age of Onset   Arthritis Mother    Depression Mother    Migraines Mother    Cancer Father        prostate   Arthritis Father    Ulcers Father    Heart attack Father    Colon cancer Maternal Uncle 62    Social History:  reports that he has never smoked. He has never used smokeless tobacco. He reports that he does not currently use alcohol. He reports that he does not use  drugs.  Allergies:  Allergies  Allergen  Reactions   Zoloft [Sertraline Hcl] Other (See Comments)    HYPERACTIVE   Quinolones Other (See Comments)    Aortic aneurysm--Did not feel well    Medications: Prior to Admission medications   Medication Sig Start Date End Date Taking? Authorizing Provider  atorvastatin (LIPITOR) 40 MG tablet Take  1 tablet  Daily  for Cholesterol Patient taking differently: Take 40 mg by mouth every evening. 02/26/23  Yes Cranford, Archie Patten, NP  Cholecalciferol (VITAMIN D3) 125 MCG (5000 UT) TABS Take 5,000 Units by mouth every evening.   Yes [provider]  enalapril (VASOTEC) 10 MG tablet Take  1 to 2  tablet(s)  Daily for BP                                                          /                                                                   TAKE                                         BY                                                 MOUTH Patient taking differently: Take 10 mg by mouth every evening. 04/02/23  Yes Lucky Cowboy, MD  levothyroxine (SYNTHROID) 75 MCG tablet Take  1 tablet  Daily  on an empty stomach with only water for 30 minutes & no Antacid meds, Calcium or Magnesium for 4 hours & avoid Biotin 05/12/23  Yes Cranford, Tonya, NP  lidocaine-prilocaine (EMLA) cream Apply 1 Application topically as needed. Apply 1 tablespoon to port site 1-2 hours prior to stick and cover with Press-and-Seal to numb site. MAY START 14 DAYS AFTER PORT IS PLACED 02/12/23  Yes Ladene Artist, MD  omeprazole (PRILOSEC) 40 MG capsule Take 1 capsule (40 mg total) by mouth 2 (two) times daily before a meal. Twice daily for 75-months then may go to once daily Patient taking differently: Take 40 mg by mouth in the morning. 02/06/23  Yes Mansouraty, Netty Starring., MD  ondansetron (ZOFRAN) 8 MG tablet Take 1 tablet (8 mg total) by mouth every 8 (eight) hours as needed. Start 72 hours after chemotherapy treatment date 04/15/23  Yes Ladene Artist, MD  oxyCODONE  (OXY IR/ROXICODONE) 5 MG immediate release tablet Take 1 tablet (5 mg total) by mouth every 4 (four) hours as needed for severe pain (pain score 7-10). 05/13/23  Yes Rana Snare, NP  potassium chloride (KLOR-CON M) 10 MEQ tablet Take 1 tablet (10 mEq total) by mouth daily. Patient taking differently: Take 10 mEq by mouth every evening. 04/29/23  Yes Rana Snare, NP  prochlorperazine (COMPAZINE) 10 MG tablet Take 1 tablet (10 mg total) by mouth  every 6 (six) hours as needed for nausea. 04/01/23  Yes Rana Snare, NP  Blood Glucose Monitoring Suppl DEVI 1 each by Does not apply route in the morning, at noon, and at bedtime. May substitute to any manufacturer covered by patient's insurance. 01/20/23   Adela Glimpse, NP  glucose blood test strip Test blood sugar once daily 02/26/23   Adela Glimpse, NP    I have reviewed the patient's current medications.  Labs:  Results for orders placed or performed during the hospital encounter of 07/02/23 (from the past 48 hours)  Glucose, capillary     Status: Abnormal   Collection Time: 07/07/23 11:35 AM  Result Value Ref Range   Glucose-Capillary 189 (H) 70 - 99 mg/dL    Comment: Glucose reference range applies only to samples taken after fasting for at least 8 hours.  Glucose, capillary     Status: Abnormal   Collection Time: 07/07/23  3:43 PM  Result Value Ref Range   Glucose-Capillary 149 (H) 70 - 99 mg/dL    Comment: Glucose reference range applies only to samples taken after fasting for at least 8 hours.  Prepare RBC (crossmatch)     Status: None   Collection Time: 07/07/23  5:06 PM  Result Value Ref Range   Order Confirmation      ORDER PROCESSED BY BLOOD BANK Performed at [facility] Lab, [address] [place], [place] [address]   Prepare fresh frozen plasma     Status: None   Collection Time: 07/07/23  5:07 PM  Result Value Ref Range   Unit Number [id number]    Blood Component Type THW PLS APHR    Unit division 00     Status of Unit ISSUED,FINAL    Transfusion Status OK TO TRANSFUSE    Unit Number [id number]    Blood Component Type THW PLS APHR    Unit division 00    Status of Unit ISSUED,FINAL    Transfusion Status      OK TO TRANSFUSE Performed at [facility], [address], [place], [place] [address]   CBC     Status: Abnormal   Collection Time: 07/07/23  5:17 PM  Result Value Ref Range   WBC 10.5 4.0 - 10.5 K/uL   RBC 2.56 (L) 4.22 - 5.81 MIL/uL   Hemoglobin 8.2 (L) 13.0 - 17.0 g/dL   HCT 53.6 (L) 64.4 - 03.4 %   MCV 101.2 (H) 80.0 - 100.0 fL   MCH 32.0 26.0 - 34.0 pg   MCHC 31.7 30.0 - 36.0 g/dL   RDW 74.2 (H) 59.5 - 63.8 %   Platelets 156 150 - 400 K/uL   nRBC 0.4 (H) 0.0 - 0.2 %    Comment: Performed at [facility] Lab, [address], [place], [place] [address]  Lactic acid, plasma     Status: Abnormal   Collection Time: 07/07/23  5:17 PM  Result Value Ref Range   Lactic Acid, Venous >9.0 (HH) 0.5 - 1.9 mmol/L    Comment: CRITICAL RESULT CALLED TO, READ BACK BY AND VERIFIED WITH Farrel Gordon RN 07/07/2023 1816 BNUNNERY Performed at [facility], [address] [place], [place] [address]   Comprehensive metabolic panel     Status: Abnormal   Collection Time: 07/07/23  5:17 PM  Result Value Ref Range   Sodium 135 135 - 145 mmol/L   Potassium 6.2 (H) 3.5 - 5.1 mmol/L    Comment: CRITICAL RESULT  CALLED TO, READ BACK BY AND VERIFIED WITH Jodi Marble RN 07/07/2022 1816 BNUNNERY   Chloride 101 98 - 111 mmol/L   CO2 13 (L) 22 - 32 mmol/L   Glucose, Bld 174 (H) 70 - 99 mg/dL    Comment: Glucose reference range applies only to samples taken after fasting for at least 8 hours.   BUN 34 (H) 8 - 23 mg/dL   Creatinine, Ser 1.61 (H) 0.61 - 1.24 mg/dL   Calcium 8.4 (L) 8.9 - 10.3 mg/dL   Total Protein 4.7 (L) 6.5 - 8.1 g/dL   Albumin 2.5 (L) 3.5 - 5.0 g/dL   AST 096 (H) 15 - 41 U/L    Comment: RESULT CONFIRMED BY MANUAL DILUTION   ALT 113 (H) 0 - 44 U/L    Comment: RESULT  CONFIRMED BY MANUAL DILUTION   Alkaline Phosphatase 390 (H) 38 - 126 U/L    Comment: HEMOLYSIS AT THIS LEVEL MAY AFFECT RESULT   Total Bilirubin 6.4 (H) 0.0 - 1.2 mg/dL   GFR, Estimated 54 (L) >60 mL/min    Comment: (NOTE) Calculated using the CKD-EPI Creatinine Equation (2021)    Anion gap 21 (H) 5 - 15    Comment: ELECTROLYTES REPEATED TO VERIFY Performed at [facility] Lab, [address], [place], [place] [address]   DIC Panel ONCE - STAT     Status: Abnormal   Collection Time: 07/07/23  5:17 PM  Result Value Ref Range   Prothrombin Time 22.5 (H) 11.4 - 15.2 seconds   INR 2.0 (H) 0.8 - 1.2    Comment: (NOTE) INR goal varies based on device and disease states.    aPTT 32 24 - 36 seconds   Fibrinogen 432 210 - 475 mg/dL    Comment: (NOTE) Fibrinogen results may be underestimated in patients receiving thrombolytic therapy.    D-Dimer, Quant 2.42 (H) 0.00 - 0.50 ug/mL-FEU    Comment: (NOTE) At the manufacturer cut-off value of 0.5 g/mL FEU, this assay has a negative predictive value of 95-100%.This assay is intended for use in conjunction with a clinical pretest probability (PTP) assessment model to exclude pulmonary embolism (PE) and deep venous thrombosis (DVT) in outpatients suspected of PE or DVT. Results should be correlated with clinical presentation.    Platelets 149 (L) 150 - 400 K/uL   Smear Review NO SCHISTOCYTES SEEN     Comment: Performed at [facility] Lab, [address], [place], [place] [address]  CBC     Status: Abnormal   Collection Time: 07/07/23  5:26 PM  Result Value Ref Range   WBC 10.8 (H) 4.0 - 10.5 K/uL   RBC 2.55 (L) 4.22 - 5.81 MIL/uL   Hemoglobin 8.2 (L) 13.0 - 17.0 g/dL   HCT 14.7 (L) 82.9 - 56.2 %   MCV 101.2 (H) 80.0 - 100.0 fL   MCH 32.2 26.0 - 34.0 pg   MCHC 31.8 30.0 - 36.0 g/dL   RDW 13.0 (H) 86.5 - 78.4 %   Platelets 157 150 - 400 K/uL   nRBC 0.3 (H) 0.0 - 0.2 %    Comment: Performed at [facility] Lab, [address], [place], [place] [address]  Type and screen [facility]     Status: None   Collection Time: 07/07/23  5:30 PM  Result Value Ref Range   ABO/RH(D) A POS    Antibody Screen NEG    Sample Expiration 06/17/2023,2359    Unit Number [id number]  Blood Component Type RED CELLS,LR    Unit division 00    Status of Unit ISSUED,FINAL    Transfusion Status OK TO TRANSFUSE    Crossmatch Result      Compatible Performed at [facility] Lab, [address] [place], [place] [address]    Unit Number [phone]    Blood Component Type RBC LR PHER1    Unit division 00    Status of Unit ISSUED,FINAL    Transfusion Status OK TO TRANSFUSE    Crossmatch Result Compatible   I-STAT 7, (LYTES, BLD GAS, ICA, H+H)     Status: Abnormal   Collection Time: 07/07/23  5:44 PM  Result Value Ref Range   pH, Arterial 7.359 7.35 - 7.45   pCO2 arterial 29.9 (L) 32 - 48 mmHg   pO2, Arterial 233 (H) 83 - 108 mmHg   Bicarbonate 16.9 (L) 20.0 - 28.0 mmol/L   TCO2 18 (L) 22 - 32 mmol/L   O2 Saturation 100 %   Acid-base deficit 8.0 (H) 0.0 - 2.0 mmol/L   Sodium 138 135 - 145 mmol/L   Potassium 3.4 (L) 3.5 - 5.1 mmol/L   Calcium, Ion 1.11 (L) 1.15 - 1.40 mmol/L   HCT 25.0 (L) 39.0 - 52.0 %   Hemoglobin 8.5 (L) 13.0 - 17.0 g/dL   Sample type ARTERIAL   Glucose, capillary     Status: Abnormal   Collection Time: 07/07/23  7:28 PM  Result Value Ref Range   Glucose-Capillary 116 (H) 70 - 99 mg/dL    Comment: Glucose reference range applies only to samples taken after fasting for at least 8 hours.  Glucose, capillary     Status: Abnormal   Collection Time: 07/07/23 11:18 PM  Result Value Ref Range   Glucose-Capillary 120 (H) 70 - 99 mg/dL    Comment: Glucose reference range applies only to samples taken after fasting for at least 8 hours.  Phosphorus     Status: Abnormal   Collection Time: 07/08/23  3:04 AM  Result Value Ref Range   Phosphorus 5.6 (H) 2.5 - 4.6 mg/dL    Comment: Performed  at [facility] Lab, [address], [place], [place] [address]  Magnesium     Status: None   Collection Time: 07/08/23  3:04 AM  Result Value Ref Range   Magnesium 1.8 1.7 - 2.4 mg/dL    Comment: Performed at [facility] Lab, [address], [place], [place] [address]  CBC     Status: Abnormal   Collection Time: 07/08/23  3:04 AM  Result Value Ref Range   WBC 5.6 4.0 - 10.5 K/uL   RBC 4.46 4.22 - 5.81 MIL/uL   Hemoglobin 14.0 13.0 - 17.0 g/dL    Comment: REPEATED TO VERIFY POST TRANSFUSION SPECIMEN    HCT 42.0 39.0 - 52.0 %   MCV 94.2 80.0 - 100.0 fL   MCH 31.4 26.0 - 34.0 pg   MCHC 33.3 30.0 - 36.0 g/dL   RDW 69.6 (H) 29.5 - 28.4 %   Platelets 142 (L) 150 - 400 K/uL    Comment: REPEATED TO VERIFY   nRBC 0.9 (H) 0.0 - 0.2 %    Comment: Performed at [facility] Lab, [address] [place], [place] [address]  Comprehensive metabolic panel     Status: Abnormal   Collection Time: 07/08/23  3:04 AM  Result Value Ref Range   Sodium 136 135 - 145 mmol/L   Potassium 4.3  3.5 - 5.1 mmol/L   Chloride 102 98 - 111 mmol/L   CO2 19 (L) 22 - 32 mmol/L   Glucose, Bld 128 (H) 70 - 99 mg/dL    Comment: Glucose reference range applies only to samples taken after fasting for at least 8 hours.   BUN 47 (H) 8 - 23 mg/dL   Creatinine, Ser 4.09 (H) 0.61 - 1.24 mg/dL   Calcium 8.1 (L) 8.9 - 10.3 mg/dL   Total Protein 5.0 (L) 6.5 - 8.1 g/dL   Albumin 2.6 (L) 3.5 - 5.0 g/dL   AST 811 (H) 15 - 41 U/L   ALT 95 (H) 0 - 44 U/L   Alkaline Phosphatase 312 (H) 38 - 126 U/L   Total Bilirubin 6.9 (H) 0.0 - 1.2 mg/dL   GFR, Estimated 42 (L) >60 mL/min    Comment: (NOTE) Calculated using the CKD-EPI Creatinine Equation (2021)    Anion gap 15 5 - 15    Comment: Performed at [facility] Lab, [address] [place], [place] [address]  Lactic acid, plasma     Status: Abnormal   Collection Time: 07/08/23  3:04 AM  Result Value Ref Range   Lactic Acid, Venous 3.6 (HH) 0.5 - 1.9 mmol/L     Comment: CRITICAL VALUE NOTED. VALUE IS CONSISTENT WITH PREVIOUSLY REPORTED/CALLED VALUE Performed at [facility] Lab, [address], [place], [place] [address]   Glucose, capillary     Status: Abnormal   Collection Time: 07/08/23  3:29 AM  Result Value Ref Range   Glucose-Capillary 127 (H) 70 - 99 mg/dL    Comment: Glucose reference range applies only to samples taken after fasting for at least 8 hours.  Protime-INR     Status: Abnormal   Collection Time: 07/08/23  5:21 AM  Result Value Ref Range   Prothrombin Time 20.5 (H) 11.4 - 15.2 seconds   INR 1.7 (H) 0.8 - 1.2    Comment: (NOTE) INR goal varies based on device and disease states. Performed at [facility] Lab, [address] [place], [place] [address]   APTT     Status: None   Collection Time: 07/08/23  5:21 AM  Result Value Ref Range   aPTT 36 24 - 36 seconds    Comment: Performed at [facility] Lab, [address], [place], [place] [address]  D-dimer, quantitative     Status: Abnormal   Collection Time: 07/08/23  5:21 AM  Result Value Ref Range   D-Dimer, Quant 3.83 (H) 0.00 - 0.50 ug/mL-FEU    Comment: (NOTE) At the manufacturer cut-off value of 0.5 g/mL FEU, this assay has a negative predictive value of 95-100%.This assay is intended for use in conjunction with a clinical pretest probability (PTP) assessment model to exclude pulmonary embolism (PE) and deep venous thrombosis (DVT) in outpatients suspected of PE or DVT. Results should be correlated with clinical presentation. Performed at [facility] Lab, [address] [place], [place] [address]   Fibrinogen     Status: Abnormal   Collection Time: 07/08/23  5:21 AM  Result Value Ref Range   Fibrinogen 633 (H) 210 - 475 mg/dL    Comment: (NOTE) Fibrinogen results may be underestimated in patients receiving thrombolytic therapy. Performed at [facility] Lab, [address] [place], [place] [address]   Glucose, capillary     Status: Abnormal    Collection Time: 07/08/23  7:34 AM  Result Value Ref Range   Glucose-Capillary  116 (H) 70 - 99 mg/dL    Comment: Glucose reference range applies only to samples taken after fasting for at least 8 hours.  Culture, blood (Routine X 2) w Reflex to ID Panel     Status: None (Preliminary result)   Collection Time: 07/08/23 10:29 AM   Specimen: BLOOD  Result Value Ref Range   Specimen Description BLOOD LEFT ANTECUBITAL    Special Requests      BOTTLES DRAWN AEROBIC ONLY Blood Culture results may not be optimal due to an inadequate volume of blood received in culture bottles   Culture      NO GROWTH < 24 HOURS Performed at [facility], [address] [place], [place] [address]    Report Status PENDING   Culture, blood (Routine X 2) w Reflex to ID Panel     Status: None (Preliminary result)   Collection Time: 07/08/23 10:29 AM   Specimen: BLOOD  Result Value Ref Range   Specimen Description BLOOD LEFT ANTECUBITAL    Special Requests      BOTTLES DRAWN AEROBIC ONLY Blood Culture results may not be optimal due to an inadequate volume of blood received in culture bottles   Culture      NO GROWTH < 24 HOURS Performed at [facility], [address] [place], [place] [address]    Report Status PENDING   Hemoglobin and hematocrit, blood     Status: Abnormal   Collection Time: 07/08/23 10:31 AM  Result Value Ref Range   Hemoglobin 12.6 (L) 13.0 - 17.0 g/dL   HCT 38.2 (L) 50.5 - 39.7 %    Comment: Performed at [facility], [address] [place], [place] [address]  Glucose, capillary     Status: None   Collection Time: 07/08/23 11:17 AM  Result Value Ref Range   Glucose-Capillary 76 70 - 99 mg/dL    Comment: Glucose reference range applies only to samples taken after fasting for at least 8 hours.  Body fluid culture w Gram Stain     Status: None (Preliminary result)   Collection Time: 07/08/23 11:56 AM   Specimen: Path fluid; Pleural Fluid  Result Value Ref Range    Specimen Description FLUID    Special Requests NONE    Gram Stain      FEW WBC PRESENT, PREDOMINANTLY PMN NO ORGANISMS SEEN Performed at [facility], [address] [place], [place] [address]    Culture PENDING    Report Status PENDING   Albumin, pleural or peritoneal fluid      Status: None   Collection Time: 07/08/23 12:01 PM  Result Value Ref Range   Albumin, Fluid 1.9 g/dL    Comment: (NOTE) No normal range established for this test Results should be evaluated in conjunction with serum values    Fluid Type-FALB PLEURAL FL, LEFT LUNG     Comment: Performed at [facility], [address], [place], [place] [address]  Protein, pleural or peritoneal fluid     Status: None   Collection Time: 07/08/23 12:01 PM  Result Value Ref Range   Total protein, fluid <3.0 g/dL    Comment: (NOTE) No normal range established for this test Results should be evaluated in conjunction with serum values    Fluid Type-FTP PLEURAL FL, LEFT LUNG     Comment: Performed at [facility], [address] [place], [place] [address]  Body fluid cell count with differential  Status: Abnormal   Collection Time: 07/08/23 12:01 PM  Result Value Ref Range   Fluid Type-FCT PLEUAL FL, LEFT LUNG    Color, Fluid ORANGE (A) YELLOW   Appearance, Fluid CLOUDY (A) CLEAR   Total Nucleated Cell Count, Fluid 5,125 (H) 0 - 1,000 cu mm    Comment: COUNT MAY BE INACCURATE DUE TO FIBRIN CLUMPS.   Neutrophil Count, Fluid 90 (H) 0 - 25 %   Lymphs, Fluid 0 %   Monocyte-Macrophage-Serous Fluid 10 (L) 50 - 90 %   Eos, Fluid 0 %   Other Cells, Fluid OTHER CELLS IDENTIFIED AS MESOTHELIAL CELLS %    Comment: Performed at [facility], [address] [place], [place] [address]  Glucose, pleural or peritoneal fluid     Status: None   Collection Time: 07/08/23 12:01 PM  Result Value Ref Range   Glucose, Fluid 91 mg/dL    Comment: (NOTE) No normal range established for this test Results should be  evaluated in conjunction with serum values    Fluid Type-FGLU PLEURAL FL, LEFT LUNG     Comment: Performed at [facility], [address] [place], [place] [address]  Lactate dehydrogenase (pleural or peritoneal fluid)     Status: Abnormal   Collection Time: 07/08/23 12:01 PM  Result Value Ref Range   LD, Fluid 395 (H) 3 - 23 U/L    Comment: (NOTE) Results should be evaluated in conjunction with serum values    Fluid Type-FLDH PLEURAL FL, LEFT LUNG     Comment: Performed at [facility], [address] [place], [place] [address]  Protein, total     Status: Abnormal   Collection Time: 07/08/23  2:08 PM  Result Value Ref Range   Total Protein 4.6 (L) 6.5 - 8.1 g/dL    Comment: Performed at [facility], [address], [place], [place] [address]  Lactate dehydrogenase     Status: Abnormal   Collection Time: 07/08/23  2:08 PM  Result Value Ref Range   LDH 737 (H) 98 - 192 U/L    Comment: Performed at [facility], [address], [place], [place] [address]  Glucose, capillary     Status: None   Collection Time: 07/08/23  3:39 PM  Result Value Ref Range   Glucose-Capillary 97 70 - 99 mg/dL    Comment: Glucose reference range applies only to samples taken after fasting for at least 8 hours.  Glucose, capillary     Status: None   Collection Time: 07/08/23  7:21 PM  Result Value Ref Range   Glucose-Capillary 72 70 - 99 mg/dL    Comment: Glucose reference range applies only to samples taken after fasting for at least 8 hours.  Glucose, capillary     Status: Abnormal   Collection Time: 07/08/23 11:29 PM  Result Value Ref Range   Glucose-Capillary 56 (L) 70 - 99 mg/dL    Comment: Glucose reference range applies only to samples taken after fasting for at least 8 hours.  Glucose, capillary     Status: Abnormal   Collection Time: 07/09/23 12:02 AM  Result Value Ref Range   Glucose-Capillary 103 (H) 70 - 99 mg/dL    Comment: Glucose reference range applies only  to samples taken after fasting for at least 8 hours.  Glucose, capillary     Status: Abnormal   Collection Time: 07/09/23  3:21 AM  Result Value Ref Range   Glucose-Capillary 49 (  L) 70 - 99 mg/dL    Comment: Glucose reference range applies only to samples taken after fasting for at least 8 hours.  Glucose, capillary     Status: Abnormal   Collection Time: 07/09/23  3:26 AM  Result Value Ref Range   Glucose-Capillary 65 (L) 70 - 99 mg/dL    Comment: Glucose reference range applies only to samples taken after fasting for at least 8 hours.  Phosphorus     Status: Abnormal   Collection Time: 07/09/23  3:29 AM  Result Value Ref Range   Phosphorus 10.0 (H) 2.5 - 4.6 mg/dL    Comment: Performed at [facility], [address], [place], [place] [address]  Magnesium     Status: None   Collection Time: 07/09/23  3:29 AM  Result Value Ref Range   Magnesium 2.3 1.7 - 2.4 mg/dL    Comment: Performed at [facility], [address] [place], [place] [address]  CBC     Status: Abnormal   Collection Time: 07/09/23  3:29 AM  Result Value Ref Range   WBC 24.7 (H) 4.0 - 10.5 K/uL   RBC 3.42 (L) 4.22 - 5.81 MIL/uL   Hemoglobin 10.7 (L) 13.0 - 17.0 g/dL   HCT 09.8 (L) 11.9 - 14.7 %   MCV 97.4 80.0 - 100.0 fL   MCH 31.3 26.0 - 34.0 pg   MCHC 32.1 30.0 - 36.0 g/dL   RDW 82.9 (H) 56.2 - 13.0 %   Platelets 80 (L) 150 - 400 K/uL    Comment: Immature Platelet Fraction may be clinically indicated, consider ordering this additional test [id number] REPEATED TO VERIFY PLATELET COUNT CONFIRMED BY SMEAR    nRBC 0.2 0.0 - 0.2 %    Comment: Performed at [facility], [address], [place], [place] [address]  Comprehensive metabolic panel     Status: Abnormal   Collection Time: 07/09/23  3:29 AM  Result Value Ref Range   Sodium 131 (L) 135 - 145 mmol/L   Potassium 6.4 (HH) 3.5 - 5.1 mmol/L    Comment: REPEATED TO VERIFY CRITICAL RESULT CALLED TO, READ BACK BY AND VERIFIED WITH J.TIRELL  RN [id number] 07/09/2023 BY G.GANADEN    Chloride 101 98 - 111 mmol/L   CO2 11 (L) 22 - 32 mmol/L   Glucose, Bld 61 (L) 70 - 99 mg/dL    Comment: Glucose reference range applies only to samples taken after fasting for at least 8 hours.   BUN 62 (H) 8 - 23 mg/dL   Creatinine, Ser 2.44 (H) 0.61 - 1.24 mg/dL   Calcium 7.1 (L) 8.9 - 10.3 mg/dL   Total Protein 4.3 (L) 6.5 - 8.1 g/dL   Albumin 2.1 (L) 3.5 - 5.0 g/dL   AST 010 (H) 15 - 41 U/L   ALT 466 (H) 0 - 44 U/L   Alkaline Phosphatase 295 (H) 38 - 126 U/L   Total Bilirubin 5.0 (H) 0.0 - 1.2 mg/dL   GFR, Estimated 23 (L) >60 mL/min    Comment: (NOTE) Calculated using the CKD-EPI Creatinine Equation (2021)    Anion gap 19 (H) 5 - 15    Comment: Performed at [facility], [address], [place], [place] [address]  Vancomycin, random     Status: None   Collection Time: 07/09/23  3:29 AM  Result Value Ref Range   Vancomycin Rm 23 ug/mL    Comment:  Random Vancomycin therapeutic range is dependent on dosage and time of specimen collection. A peak range is 20-40 ug/mL A trough range is 5-15 ug/mL        Performed at [facility], [address], [place], [place] [address]   Glucose, capillary     Status: Abnormal   Collection Time: 07/09/23  4:10 AM  Result Value Ref Range   Glucose-Capillary 64 (L) 70 - 99 mg/dL    Comment: Glucose reference range applies only to samples taken after fasting for at least 8 hours.  Glucose, capillary     Status: None   Collection Time: 07/09/23  4:16 AM  Result Value Ref Range   Glucose-Capillary 94 70 - 99 mg/dL    Comment: Glucose reference range applies only to samples taken after fasting for at least 8 hours.  Basic metabolic panel     Status: Abnormal   Collection Time: 07/09/23  5:57 AM  Result Value Ref Range   Sodium 132 (L) 135 - 145 mmol/L   Potassium 6.1 (H) 3.5 - 5.1 mmol/L   Chloride 102 98 - 111 mmol/L   CO2 10 (L) 22 - 32 mmol/L   Glucose, Bld 139 (H) 70 - 99 mg/dL     Comment: Glucose reference range applies only to samples taken after fasting for at least 8 hours.   BUN 65 (H) 8 - 23 mg/dL   Creatinine, Ser 6.04 (H) 0.61 - 1.24 mg/dL   Calcium 6.9 (L) 8.9 - 10.3 mg/dL   GFR, Estimated 21 (L) >60 mL/min    Comment: (NOTE) Calculated using the CKD-EPI Creatinine Equation (2021)    Anion gap 20 (H) 5 - 15    Comment: Performed at [facility], [address], [place], [place] [address]  Glucose, capillary     Status: Abnormal   Collection Time: 07/09/23  6:41 AM  Result Value Ref Range   Glucose-Capillary 103 (H) 70 - 99 mg/dL    Comment: Glucose reference range applies only to samples taken after fasting for at least 8 hours.  Glucose, capillary     Status: Abnormal   Collection Time: 07/09/23  7:27 AM  Result Value Ref Range   Glucose-Capillary 69 (L) 70 - 99 mg/dL    Comment: Glucose reference range applies only to samples taken after fasting for at least 8 hours.     ROS:  Pertinent items noted in HPI and remainder of comprehensive ROS otherwise negative.  Physical Exam: Vitals:   07/09/23 0600 07/09/23 0804  BP: (!) 110/58   Pulse:    Resp: (!) 26 (!) 26  Temp:    SpO2:       General: sedated and intubated HEENT: intubated Heart: tachycardic rate, sinus rhythm, absent palpable pulses in BLE but present on doppler Lungs: CTAB Abdomen: diminished bowel sounds, incisions site clean and dry, JP drain present Extremities:no asymmetry, cool lower extremities Skin: midline incision of the abdomen that appears clean Neuro: unable to assess  Assessment/Plan: 1.Renal: AKI/Hyperkalemia: patient with AKI that appears secondary pre-renal insults likely culminating to ATN in the setting of hemorrhagic versus septic shock.  This is evidenced by transaminitis as well.  There could also be a component of contrast induced nephropathy.  Patient has hyperkalemia that is being medically managed.  Will continue with medical management and  trend potassium.  Currently there are no indications for emergent dialysis but if hyperkalemia or acidosis is refractory or patient has volume overload patient start  CRRT given his hypotension.  Patient has anion gap metabolic acidosis that appears secondary to lactic acidosis given the shock state.  Continue with supportive care with pressors, IV fluids, and treatment of underlying infection.  Will follow-up on urine studies and add on protein creatinine ratio to the urine studies. -Continue IV fluids.  Patient started on bicarb gtt 03/26 -Follow-up on urine studies -Maintain strict Input and Output monitoring including daily standing weights if feasible.    2. Shock: Likely hemorrhagic versus septic.  Appears to be combination of these with low hemoglobin despite multiple transfusions and an infected hematoma with leukocytosis.  Patient's renal function will hopefully improve as he is on pressors.  Continue antibiotics, Solu-Medrol. Management per primary team.   3. Elevated LFTs: 2/2 to problem 2.  Continue supportive care. Daily LFTs  4. Anemia: Appears secondary to acute blood loss anemia.  Looks like patient had some baseline anemia with hemoglobin around 11 this may be a side effect of her chemotherapy. Defer treatment to primary team.  5.  Hypothyroidism: At home on on levothyroxine.  Per  primary team    Gwenevere Abbot, MD Eligha Bridegroom. [facility], PGY-3    Seen and examined independently this AM.  Agree with note and exam as documented above by physician extender and as noted here.  Mr. Chain is a 63 year old gentleman with a history of hypertension, pancreatic cancer status post chemo therapy who underwent a Whipple last week.  Nephrology is consulted for assistance with management of AKI and hyperkalemia.  He has been hypotensive and has been on pressors.  He had a CT angio earlier this week.  He is on Levophed 12 mcg/min and vasopressin 0.04 units/min  at the time of my arrival.  He was also started on a bicarb gtt this AM for hyperkalemia which is improved with medical management.  Spoke with his wife at bedside and discussed the risks benefits and indications for renal replacement therapy.  She would want for him to have renal replacement therapy if this were indicated.  He had 225 mL UOP over 3/25.   General adult male in bed critically ill HEENT normocephalic atraumatic extraocular movements intact sclera anicteric Neck supple trachea midline Lungs coarse mechanical breath sounds FiO2 45 and PEEP of 8 Heart S1-S2 no rub Abdomen soft nontender midline abdominal incision is stapled has a JP drain Extremities no edema  Psych normal mood and affect Skin is slightly mottled and extremities are cool Noted port in the left chest (for chemotherapy) GU foley in place with tea colored urine  AKI - Multifactorial secondary to ischemic ATN with hypotension and now compounded by contrast and prerenal losses - No acute indication for renal replacement therapy if potassium can be medically managed however I have discussed with his wife that renal replacement therapy may become indicated as soon as the next 24 to 48 hours.  May need CRRT soon.  Reaching out to critical care   - Management of shock as below avoid hypotension - UP/cr ratio and UA  - Strict ins/outs - has a foley   Septic shock As well as hemorrhagic shock Pressors per primary team and critical care PRBCs per primary team Antibiotics per primary team and critical care  Acute hypoxic respiratory failure On mechanical ventilation  Transaminitis with shock liver Trend LFTs per primary team  Anemia secondary to GI losses PRBCs per primary team  Hyperphosphatemia  - secondary to AKI - lower TPN phos content -  looks like TPN has zero phos and zero K per pharmacy note  Pancreatic cancer Patient is status post Whipple procedure and previously chemotherapy  Thank you for the  consult.  Please do not hesitate to contact me with any questions regarding our patient  Estanislado Emms, MD 07/09/2023  1:50 PM

## 2023-07-09 NOTE — Progress Notes (Signed)
 PT Cancellation Note  Patient Details Name: Randy Allen MRN: [id number] DOB: Sep 09, 1960   Cancelled Treatment:    Reason Eval/Treat Not Completed: Medical issues which prohibited therapy (discussed with RN; pt on pressors, intubated and sedated. Will sign off from acute PT. Please re-consult when pt becomes appropriate to participate).  Lillia Pauls, PT, DPT Acute Rehabilitation Services Office [phone]    [name] 07/09/2023, 1:36 PM

## 2023-07-09 NOTE — Progress Notes (Signed)
 Nutrition Follow-up  DOCUMENTATION CODES:   Severe malnutrition in context of chronic illness  INTERVENTION:   TPN administration per Pharmacy to meet nutrition needs - updated nutrition needs 3/26  As able recommend initiate enteral nutrition therapy via J-tube  NUTRITION DIAGNOSIS:   Severe Malnutrition related to chronic illness (cancer) as evidenced by moderate muscle depletion, moderate fat depletion, energy intake < or equal to 75% for > or equal to 1 month, percent weight loss. Ongoing  GOAL:   Patient will meet greater than or equal to 90% of their needs Progressing with TPN initiation   MONITOR:   I & O's, Vent status  REASON FOR ASSESSMENT:   Consult Enteral/tube feeding initiation and management  ASSESSMENT:   Pt with PMH of HTN, bicsupid aortic valve, Vitamin D deficiency, and dx with pancreatic cancer 12/2022 and received 8 cycles of FOLFIRINOX prior to surgery. Pt now admitted for Whipple.  Noted TF continues to be held, starting TPN. Noted renal consulted and CRRT to be started. Renal following for AKI, UOP decreased.    3/19 s/p Whipple, J-tube placement, and liver biopsy 3/20 trickle TF started 3/21 started on Clear liquids, NG tube out 3/24 advancing TF to goal rate  3/25 TF held, per CT possible pancreatic leak with infected hematoma 3/26 TPN starting; plan to start CRRT   Medications reviewed and include: solucortef, 0-9 units novolog SSI every 4 hours, synthroid, protonix, lokelma Fentanyl  Levophed @ 9 mcg  Nabicarb @ 75 ml/hr Vasopressin @ 0.04 units  TPN starting via PICC (placed 3/25) Starting tonight 3/26 @ 30 ml/hr   Labs reviewed:  Na 130 K 7.0 Phos 10.0 CBG's: 37-147  16 F J-tube - clamped  1 RLQ JP: 15 ml 2 RLQ JP 20 ml UOP 225 ml  16 F NG 450 ml  CT: 910 ml   Current weight: 63.5 kg Admission weight: 60.3 kg   Diet Order:   Diet Order             Diet NPO time specified  Diet effective now                    EDUCATION NEEDS:   Education needs have been addressed  Skin:  Skin Assessment: Reviewed RN Assessment (abd incision)  Last BM:  3/26 x 3  Height:   Ht Readings from Last 1 Encounters:  07/02/23 5\' 8"  (1.727 m)    Weight:   Wt Readings from Last 1 Encounters:  07/09/23 63.5 kg    Ideal Body Weight:  70 kg  BMI:  Body mass index is 21.29 kg/m.  Estimated Nutritional Needs:   Kcal:  2000-2200  Protein:  130-150 grams  Fluid:  >2 L/day  Cammy Copa., RD, LDN, CNSC See AMiON for contact information

## 2023-07-09 NOTE — TOC CM/SW Note (Signed)
 Transition of Care [facility]) - Inpatient Brief Assessment   Patient Details  Name: Randy Allen MRN: [id number] Date of Birth: 11-Mar-1961  [facility] CM/SW Contact:    Mearl Latin, LCSW Phone Number: 07/09/2023, 10:08 AM   Clinical Narrative: Patient remains intubated. [facility] consult if needs arise.   Transition of Care Asessment: Insurance and Status: Insurance coverage has been reviewed Patient has primary care physician: Yes Home environment has been reviewed: From home Prior level of function:: independent Prior/Current Home Services: No current home services Social Drivers of Health Review: SDOH reviewed no interventions necessary Readmission risk has been reviewed: Yes Transition of care needs: no transition of care needs at this time

## 2023-07-09 NOTE — Progress Notes (Addendum)
 Pt seen and examined this evening.  Wife and brother in law at bedside  Had OG placed to suction and ~500 cc out, gastro-bilious fluid, no clots. Distention throughout the day per his wife has improved.  Improving pressor requirement throughout the day today; started on CRRT for acute renal failure, hyperkalemia, acidosis  K+ currently >7 but is now on CRRT  Abd soft, mild to moderate distention but not at all rigid. JP dains with stable appearing output relative to Dr. Arita Miss notations this morning.    Recheck CBC ordered; if hgb <7, would consider transfusing Check coags - PT/INR, PTT Correcting hyperkalemia  Case also discussed tonight with Dr. Donell Beers; if isn't improving despite ongoing resuscitation and dialysis, she may consider exploration in AM  Marin Olp, MD [facility], A [facility]

## 2023-07-09 NOTE — Progress Notes (Addendum)
 NAME:  Randy Allen, MRN:  [id number], DOB:  16-Dec-1960, LOS: 7 ADMISSION DATE:  07/02/2023, CONSULTATION DATE:  07/09/23 REFERRING MD:  CCS , CHIEF COMPLAINT:  encephalopathy   History of Present Illness:  63 year old man status post Whipple procedure 3/19 who were seen for clinical decompensation with encephalopathy tachypnea hypotension concern for septic versus Magick shock.  At time of my arrival patient is encephalopathic.  Very tachypneic.  Tachycardic to 140s.  Hypotensive.  Multiple pushes of phenylephrine.  Blood pressures improved.  Able to tolerate etomidate for intubation.  Good view.  Placed without difficulty.  Somewhat fresh blood noted in the esophagus during intubation.  Vasopressor dose escalated.  Central line arterial line placed.  Port was accessed.  Repeat labs sent.  Concern for septic shock and reactive shock.  Possible severe acidosis, acidemia given clinical picture.  Discussed with surgery at bedside.  Plan for repeat imaging then next steps pending imaging findings.  Pertinent  Medical History  Pancreatic cancer status post Whipple 07/02/2023  Significant Hospital Events: Including procedures, antibiotic start and stop dates in addition to other pertinent events   3/19 whippple 3/24 shock, AMS, intubated. 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 3/25 L pigtail chest tube placed for effusion/empyema. Echo 70-75%, collapsed IVC with >50% resp variability, RA pressure 3, RV mod enlarged 3/26 pressor requirements improved some but remains on Vaso  and NE. Renal function worse, minimal UOP  Interim History / Subjective:  Chest tube draining well, 910 overnight. Exudative per LDH, 90% neutrophils with neg gram stain and NGTD on culture Unfortunately not much UOP and renal function worse this AM with bump in K and worsening acidosis. Transaminases worse, bili stable.  Objective   Blood pressure (!) 110/58, pulse 100, temperature 99.8 F (37.7 C), temperature source Axillary, resp. rate (!) 26, height 5\' 8"  (1.727 m), weight 63.5 kg, SpO2 100%.    Vent Mode: PRVC FiO2 (%):  [45 %-50 %] 45 % Set Rate:  [26 bmp] 26 bmp Vt Set:  [550 mL] 550 mL PEEP:  [8 cmH20] 8 cmH20 Plateau Pressure:  [17 cmH20-19 cmH20] 19 cmH20   Intake/Output Summary (Last 24 hours) at 07/09/2023 0804 Last data filed at 07/09/2023 0700 Gross per 24 hour  Intake 4415.3 ml  Output 1620 ml  Net 2795.3 ml   Filed Weights   07/07/23 0428 07/08/23 0500 07/09/23 0500  Weight: 65 kg 61.7 kg 63.5 kg    Examination: General: Adult male, critically ill, in NAD. Neuro: Sedated but opens eyes to voice, follows basic commands. HEENT: [place]/AT. Sclerae anicteric. ETT in place. Cardiovascular: RRR, no M/R/G.  Lungs: Respirations even and unlabored.  CTA bilaterally, No W/R/R. L chest tube in place, no leak, draining well. Abdomen: JP drains without output currently in bulb, OGT with minimal dark bloody output. Midline incision intact, clean. BS hypoactive. Abd soft, NT/ND.  Musculoskeletal: No gross deformities, no edema.  Skin: Intact, warm, no rashes.   Assessment & Plan:   Ventilator dependence due to shock/near arrest 3/24 now ongoing 2/2 multiorgan failure. Bilateral pleural effusions L > R - s/p L pigtail chest tube 3/25. - Full  vent support. - No SBT/extubation consideration given his degree of shock/vasopressor support. - Bronchial hygiene. - Continue CT to suction. - CT routine flushes/care per protocol. - Follow CXR intermittently.  Septic shock with  presumed intra-abdominal source status post recent surgical procedure, Whipple: Murky dark fluid noted coming from JP drain as well as oozing from around site of GJ tube insertion. Also some concern empyema/complicated pleural effusion (some fibrinous material noted on POCUS), s/p chest tube placement 3/25. Hypovolemia - Echo 3/25 with collapsed RV and RA pressure 3 - Continue broad spectrum abx, vanc/merrem/diflucan. - Follow cultures (added 3/25 as none had been sent prior), pleural fluid cultures sent 3/25. - Continue NE and vaso, goal MAP > 65. - Continue stress dose steroids. - 2L LR bolus + Albumin. - Surgery following.  AKI, worse - likely 2/2 2 shock and hypovolemia. AGMA - presumed 2/2 renal failure. Hyperkalemia - 2/2 above. - Aggressive fluid challenge with 2L LR bolus (echo suggests volume depletion). - Start HCO3 infusion. - 10u insulin, 1amp D50, 1g Ca gluconate, 10g Lokelma. - Add on renal US. - CCS consulting nephrology to have them on board, likely no emergent CRRT currently but possibly in next 24 - 48 hours depending on course. - Repeat BMP this PM. - At risk for fluid overload if UOP doesn't pick up.  Transaminitis - presumed from shock/low flow state, now worse - Supportive care as above. - Trend LFT's.  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 - F/u with surgery and oncology.  [facility] (right click and "Reselect all SmartList Selections" daily)  Per primary   Critical care time: 50 min.    Rutherford Guys, PA - [facility] For pager details, please see AMION or use Epic chat  After 1900, please call [facility] for cross coverage needs 07/09/2023, 8:04 AM

## 2023-07-09 NOTE — Plan of Care (Signed)
 Spoke with his wife and let her know that we do recommend initiation of CRRT.  I spoke with critical care and they agree.  They will coordinate access placement for CRRT.    Placing orders   Estanislado Emms, MD 2:20 PM 07/09/2023

## 2023-07-10 ENCOUNTER — Inpatient Hospital Stay (HOSPITAL_COMMUNITY)

## 2023-07-10 DIAGNOSIS — J90 Pleural effusion, not elsewhere classified: Secondary | ICD-10-CM | POA: Diagnosis not present

## 2023-07-10 DIAGNOSIS — N179 Acute kidney failure, unspecified: Secondary | ICD-10-CM | POA: Diagnosis not present

## 2023-07-10 DIAGNOSIS — E875 Hyperkalemia: Secondary | ICD-10-CM | POA: Diagnosis not present

## 2023-07-10 DIAGNOSIS — D696 Thrombocytopenia, unspecified: Secondary | ICD-10-CM

## 2023-07-10 DIAGNOSIS — A419 Sepsis, unspecified organism: Secondary | ICD-10-CM

## 2023-07-10 DIAGNOSIS — R6521 Severe sepsis with septic shock: Secondary | ICD-10-CM | POA: Diagnosis not present

## 2023-07-10 DIAGNOSIS — E8729 Other acidosis: Secondary | ICD-10-CM

## 2023-07-10 DIAGNOSIS — D6489 Other specified anemias: Secondary | ICD-10-CM | POA: Diagnosis present

## 2023-07-10 DIAGNOSIS — D65 Disseminated intravascular coagulation [defibrination syndrome]: Secondary | ICD-10-CM | POA: Diagnosis not present

## 2023-07-10 DIAGNOSIS — Z90410 Acquired total absence of pancreas: Secondary | ICD-10-CM | POA: Diagnosis not present

## 2023-07-10 DIAGNOSIS — E872 Acidosis, unspecified: Secondary | ICD-10-CM | POA: Diagnosis not present

## 2023-07-10 DIAGNOSIS — J9600 Acute respiratory failure, unspecified whether with hypoxia or hypercapnia: Secondary | ICD-10-CM

## 2023-07-10 LAB — TYPE AND SCREEN
ABO/RH(D): A POS
Antibody Screen: NEGATIVE
Unit division: 0
Unit division: 0

## 2023-07-10 LAB — POCT I-STAT 7, (LYTES, BLD GAS, ICA,H+H)
Acid-base deficit: 15 mmol/L — ABNORMAL HIGH (ref 0.0–2.0)
Acid-base deficit: 15 mmol/L — ABNORMAL HIGH (ref 0.0–2.0)
Bicarbonate: 10.3 mmol/L — ABNORMAL LOW (ref 20.0–28.0)
Bicarbonate: 11 mmol/L — ABNORMAL LOW (ref 20.0–28.0)
Calcium, Ion: 0.76 mmol/L — CL (ref 1.15–1.40)
Calcium, Ion: 0.78 mmol/L — CL (ref 1.15–1.40)
HCT: 21 % — ABNORMAL LOW (ref 39.0–52.0)
HCT: 19 % — ABNORMAL LOW (ref 39.0–52.0)
Hemoglobin: 7.1 g/dL — ABNORMAL LOW (ref 13.0–17.0)
Hemoglobin: 6.5 g/dL — CL (ref 13.0–17.0)
O2 Saturation: 98 %
O2 Saturation: 99 %
Patient temperature: 97.5
Patient temperature: 97.3
Potassium: 6.5 mmol/L (ref 3.5–5.1)
Potassium: 6.2 mmol/L — ABNORMAL HIGH (ref 3.5–5.1)
Sodium: 129 mmol/L — ABNORMAL LOW (ref 135–145)
Sodium: 129 mmol/L — ABNORMAL LOW (ref 135–145)
TCO2: 11 mmol/L — ABNORMAL LOW (ref 22–32)
TCO2: 12 mmol/L — ABNORMAL LOW (ref 22–32)
pCO2 arterial: 21.9 mmHg — ABNORMAL LOW (ref 32–48)
pCO2 arterial: 23.7 mmHg — ABNORMAL LOW (ref 32–48)
pH, Arterial: 7.275 — ABNORMAL LOW (ref 7.35–7.45)
pH, Arterial: 7.269 — ABNORMAL LOW (ref 7.35–7.45)
pO2, Arterial: 123 mmHg — ABNORMAL HIGH (ref 83–108)
pO2, Arterial: 135 mmHg — ABNORMAL HIGH (ref 83–108)

## 2023-07-10 LAB — CBC
HCT: 22.4 % — ABNORMAL LOW (ref 39.0–52.0)
Hemoglobin: 7.4 g/dL — ABNORMAL LOW (ref 13.0–17.0)
MCH: 33.9 pg (ref 26.0–34.0)
MCHC: 33 g/dL (ref 30.0–36.0)
MCV: 102.8 fL — ABNORMAL HIGH (ref 80.0–100.0)
Platelets: 34 10*3/uL — ABNORMAL LOW (ref 150–400)
RBC: 2.18 MIL/uL — ABNORMAL LOW (ref 4.22–5.81)
RDW: 18.6 % — ABNORMAL HIGH (ref 11.5–15.5)
WBC: 17.7 10*3/uL — ABNORMAL HIGH (ref 4.0–10.5)
nRBC: 1 % — ABNORMAL HIGH (ref 0.0–0.2)

## 2023-07-10 LAB — BPAM RBC
Blood Product Expiration Date: 202504152359
Blood Product Expiration Date: 202504152359
ISSUE DATE / TIME: 202503242007
ISSUE DATE / TIME: 202503242258
Unit Type and Rh: 6200
Unit Type and Rh: 6200

## 2023-07-10 LAB — PHOSPHORUS: Phosphorus: 9.8 mg/dL — ABNORMAL HIGH (ref 2.5–4.6)

## 2023-07-10 LAB — COOXEMETRY PANEL
Carboxyhemoglobin: 1.4 % (ref 0.5–1.5)
Methemoglobin: <0.7 % (ref 0.0–1.5)
O2 Saturation: 100 %
Total hemoglobin: 7.2 g/dL — ABNORMAL LOW (ref 12.0–16.0)

## 2023-07-10 LAB — DIC (DISSEMINATED INTRAVASCULAR COAGULATION)PANEL
D-Dimer, Quant: 13.64 ug{FEU}/mL — ABNORMAL HIGH (ref 0.00–0.50)
Fibrinogen: 316 mg/dL (ref 210–475)
INR: 5.4 (ref 0.8–1.2)
Platelets: 27 10*3/uL — CL (ref 150–400)
Prothrombin Time: 49.5 s — ABNORMAL HIGH (ref 11.4–15.2)
Smear Review: NONE SEEN
aPTT: 65 s — ABNORMAL HIGH (ref 24–36)

## 2023-07-10 LAB — COMPREHENSIVE METABOLIC PANEL WITH GFR
ALT: 2398 U/L — ABNORMAL HIGH (ref 0–44)
AST: >7000 U/L — ABNORMAL HIGH (ref 15–41)
Albumin: 2.8 g/dL — ABNORMAL LOW (ref 3.5–5.0)
Alkaline Phosphatase: 472 U/L — ABNORMAL HIGH (ref 38–126)
Anion gap: 25 — ABNORMAL HIGH (ref 5–15)
BUN: 32 mg/dL — ABNORMAL HIGH (ref 8–23)
CO2: 10 mmol/L — ABNORMAL LOW (ref 22–32)
Calcium: 6.5 mg/dL — ABNORMAL LOW (ref 8.9–10.3)
Chloride: 98 mmol/L (ref 98–111)
Creatinine, Ser: 2 mg/dL — ABNORMAL HIGH (ref 0.61–1.24)
GFR, Estimated: 37 mL/min — ABNORMAL LOW (ref >60)
Glucose, Bld: 111 mg/dL — ABNORMAL HIGH (ref 70–99)
Potassium: 6.8 mmol/L (ref 3.5–5.1)
Sodium: 133 mmol/L — ABNORMAL LOW (ref 135–145)
Total Bilirubin: 5.7 mg/dL — ABNORMAL HIGH (ref 0.0–1.2)

## 2023-07-10 LAB — PROTIME-INR
INR: 7.7 (ref 0.8–1.2)
Prothrombin Time: 64.9 s — ABNORMAL HIGH (ref 11.4–15.2)

## 2023-07-10 LAB — POTASSIUM
Potassium: 6.6 mmol/L (ref 3.5–5.1)
Potassium: 7.2 mmol/L (ref 3.5–5.1)

## 2023-07-10 LAB — HEMOGLOBIN AND HEMATOCRIT, BLOOD
HCT: 22.5 % — ABNORMAL LOW (ref 39.0–52.0)
Hemoglobin: 7.6 g/dL — ABNORMAL LOW (ref 13.0–17.0)

## 2023-07-10 LAB — BASIC METABOLIC PANEL WITH GFR
Anion gap: 27 — ABNORMAL HIGH (ref 5–15)
BUN: 25 mg/dL — ABNORMAL HIGH (ref 8–23)
CO2: 8 mmol/L — ABNORMAL LOW (ref 22–32)
Calcium: 6.5 mg/dL — ABNORMAL LOW (ref 8.9–10.3)
Chloride: 98 mmol/L (ref 98–111)
Creatinine, Ser: 1.76 mg/dL — ABNORMAL HIGH (ref 0.61–1.24)
GFR, Estimated: 43 mL/min — ABNORMAL LOW (ref >60)
Glucose, Bld: 103 mg/dL — ABNORMAL HIGH (ref 70–99)
Potassium: 6.6 mmol/L (ref 3.5–5.1)
Sodium: 133 mmol/L — ABNORMAL LOW (ref 135–145)

## 2023-07-10 LAB — GLUCOSE, CAPILLARY
Glucose-Capillary: 122 mg/dL — ABNORMAL HIGH (ref 70–99)
Glucose-Capillary: 72 mg/dL (ref 70–99)

## 2023-07-10 LAB — MAGNESIUM: Magnesium: 2.9 mg/dL — ABNORMAL HIGH (ref 1.7–2.4)

## 2023-07-10 LAB — LACTIC ACID, PLASMA: Lactic Acid, Venous: >9 mmol/L (ref 0.5–1.9)

## 2023-07-10 LAB — TRIGLYCERIDES: Triglycerides: 433 mg/dL — ABNORMAL HIGH (ref <150)

## 2023-07-10 LAB — AMMONIA: Ammonia: 149 umol/L — ABNORMAL HIGH (ref 9–35)

## 2023-07-10 MED ORDER — VITAMIN K1 10 MG/ML IJ SOLN
10.0000 mg | INTRAVENOUS | Status: AC
  Administered 2023-07-10: 10 mg via INTRAVENOUS
  Filled 2023-07-10: qty 1

## 2023-07-10 MED ORDER — DEXTROSE 5 % IV SOLN
6.2500 mg/kg/h | INTRAVENOUS | Status: DC
  Filled 2023-07-10: qty 90

## 2023-07-10 MED ORDER — ALBUTEROL SULFATE (2.5 MG/3ML) 0.083% IN NEBU
10.0000 mg | INHALATION_SOLUTION | Freq: Once | RESPIRATORY_TRACT | Status: AC
  Administered 2023-07-10: 10 mg via RESPIRATORY_TRACT
  Filled 2023-07-10: qty 12

## 2023-07-10 MED ORDER — STERILE WATER FOR INJECTION IV SOLN
INTRAVENOUS | Status: DC
  Filled 2023-07-10: qty 150

## 2023-07-10 MED ORDER — SODIUM CHLORIDE 0.9 % IV SOLN
4.0000 g | INTRAVENOUS | Status: AC
  Administered 2023-07-10: 4 g via INTRAVENOUS
  Filled 2023-07-10: qty 40

## 2023-07-10 MED ORDER — ACETYLCYSTEINE LOAD VIA INFUSION
150.0000 mg/kg | Freq: Once | INTRAVENOUS | Status: AC
  Administered 2023-07-10: 10395 mg via INTRAVENOUS
  Filled 2023-07-10: qty 341

## 2023-07-10 MED ORDER — DEXTROSE 5 % IV SOLN
12.5000 mg/kg/h | INTRAVENOUS | Status: DC
  Administered 2023-07-10: 12.5 mg/kg/h via INTRAVENOUS
  Filled 2023-07-10: qty 90

## 2023-07-10 MED ORDER — SODIUM CHLORIDE 0.9% IV SOLUTION: Freq: Once | INTRAVENOUS | Status: AC

## 2023-07-11 LAB — BPAM FFP
Blood Product Expiration Date: 202503312359
Blood Product Expiration Date: 202503312359
Blood Product Unit Number: 202503312359
ISSUE DATE / TIME: 202503270016
ISSUE DATE / TIME: 202503270651
PRODUCT CODE: 202503271535
Unit Type and Rh: 202503312359
Unit Type and Rh: 202503312359
Unit Type and Rh: 202503312359
Unit Type and Rh: 6200
Unit Type and Rh: 6200
Unit Type and Rh: 6200

## 2023-07-11 LAB — PREPARE FRESH FROZEN PLASMA

## 2023-07-11 LAB — BODY FLUID CULTURE W GRAM STAIN

## 2023-07-11 LAB — TOTAL BILIRUBIN, BODY FLUID: Total bilirubin, fluid: 0.2 mg/dL

## 2023-07-13 LAB — CULTURE, BLOOD (ROUTINE X 2)
Culture: NO GROWTH
Culture: NO GROWTH

## 2023-07-15 NOTE — Progress Notes (Signed)
   06/21/2023 1146  Spiritual Encounters  Type of Visit Initial  Care provided to: Pt and family  Reason for visit End-of-life  OnCall Visit No   Chaplain went to be with Pt's wife Annice Pih) and wife's sister Zella Ball) who were grieving the unexpected passing of the Patient (Jackie's husband, Torry). Chaplain provided spiritual care to Pt's wife and her sister and upon wife's request, Chaplain prayed with the Pt, his wife and her sister.   Chaplain Daron Offer

## 2023-07-15 NOTE — Progress Notes (Signed)
 8 Days Post-Op    Subjective: Pt k not responsive to kayexalate and nephrology started CRRT. However, acidosis still wasn't clearing. Dr Cliffton Asters evaluated patient and abd exam remained benign with no distention and drain output down.  Pressors remained stable.   However, overnight, patient worsened.  Coags were checked and INR was 9.  Lactate remained above 9.  Transaminases the AM spiked from in the hundreds to in the thousands this AM.  Acidosis remains significant and INR remains high at 7. Platelets down to 34k.  Ammonia spiked this am and GCS 3.   Had episode of bradycardia overnight.    Objective: Vital signs in last 24 hours: Temp:  [96.7 F (35.9 C)-98.5 F (36.9 C)] 97.3 F (36.3 C) (03/27 0847) Pulse Rate:  [26-100] 82 (03/27 0847) Resp:  [20-39] 28 (03/27 0742) BP: (77-149)/(44-101) 124/60 (03/27 0719) SpO2:  [48 %-100 %] 90 % (03/27 0742) Arterial Line BP: (91-195)/(50-101) 124/60 (03/27 0719) FiO2 (%):  [45 %] 45 % (03/27 0742) Weight:  [69.3 kg] 69.3 kg (03/27 0500) Last BM Date : 07/09/23  Intake/Output from previous day: 03/26 0701 - 03/27 0700 In: 6251.8 [I.V.:2720.5; Blood:230; IV Piggyback:3186.3] Out: 812 [Urine:32; Emesis/NG output:270; Drains:290; Chest Tube:220] Intake/Output this shift: Total I/O In: 715.8 [I.V.:360.8; Blood:199; Other:108; IV Piggyback:48.1] Out: 10 [Chest Tube:10]  Gen: Sedated on ventilator Resp: no distress, synchronous on vent. Jaundiced, but improved.  CV regular, tachycardic Abd:  soft, sl protuberant. Anasarca. J-tube in place.  Right lateral drain bilious Medial drain looks like old blood. Some hematoma settling out in the lower abdominal wall that is worse today.  Ext:  warm, well perfused.  No edema. Scds in place.   Lab Results: CBC  Recent Labs    07/09/23 0329 07/09/23 1342 07/06/2023 0403 07/07/2023 0603 06/20/2023 0837 06/30/2023 0915  WBC 24.7*  --  17.7*  --   --   --   HGB 10.7*   < > 7.4*   < > 7.6* 6.5*  HCT  33.3*   < > 22.4*   < > 22.5* 19.0*  PLT 80*  --  34*  --   --   --    < > = values in this interval not displayed.   BMET Recent Labs    07/09/23 1756 07/09/23 1856 06/15/2023 0403 07/06/2023 0603 06/19/2023 0639 06/29/2023 0915  NA 134*  --  133* 129*  --  129*  K 7.3*   < > 6.8* 6.5* 7.2* 6.2*  CL 102  --  98  --   --   --   CO2 10*  --  10*  --   --   --   GLUCOSE 71  --  111*  --   --   --   BUN 58*  --  32*  --   --   --   CREATININE 3.18*  --  2.00*  --   --   --   CALCIUM 6.6*  --  6.5*  --   --   --    < > = values in this interval not displayed.   PT/INR Recent Labs    07/09/23 2223 06/18/2023 0403  LABPROT 89.4* 64.9*  INR 11.6* 7.7*   ABG Recent Labs    07/03/2023 0603 07/04/2023 0915  PHART 7.275* 7.269*  HCO3 10.3* 11.0*    Studies/Results: DG CHEST PORT 1 VIEW Result Date: 07/09/2023 CLINICAL DATA:  Central line placement EXAM: PORTABLE CHEST 1 VIEW COMPARISON:  07/08/2023  FINDINGS: Interval placement of right IJ approach central venous catheter with distal tip at the level of the distal SVC. Interval placement of right PICC line with distal tip at the superior cavoatrial junction. Left chest port is stable in positioning. ET tube terminates 4.7 cm above the carina. Enteric tube extends into the stomach. Left chest tube at the periphery of the left lung base. Normal heart size. Minimal bibasilar atelectasis. Small right pleural effusion. No definite pneumothorax although a small portion of the left lung apex was not included within the field of view. IMPRESSION: 1. Interval placement of right IJ approach central venous catheter with distal tip at the level of the distal SVC. 2. Interval placement of right PICC line with distal tip at the superior cavoatrial junction. 3. No definite pneumothorax although a small portion of the left lung apex was not included within the field of view. 4. Small right pleural effusion. Electronically Signed   By: Duanne Guess D.O.   On:  07/09/2023 16:46   US RENAL Result Date: 07/09/2023 CLINICAL DATA:  Acute renal injury EXAM: RENAL / URINARY TRACT ULTRASOUND COMPLETE COMPARISON:  CT GI bleed 07/11/2023 FINDINGS: Right Kidney: Renal measurements: 10.7 x 11.9 x 4.4 cm = volume: 115 mL. Echogenicity within normal limits. No mass or hydronephrosis visualized. Left Kidney: Renal measurements: 11.0 x 4.8 x 4.4 cm = volume: 122 mL. Normal renal cortical thickness and echogenicity. No hydronephrosis. Trace perinephric fluid. There is a 9 mm hypoechoic mass, partially exophytic inferior pole left kidney. Bladder: Not visualized due to overlying bowel gas. Other: Trace perihepatic fluid. IMPRESSION: 1. No hydronephrosis. 2. Trace left perinephric fluid. 3. Trace perihepatic fluid. 4. There is a 9 mm hypoechoic mass, partially exophytic inferior pole left kidney. This may represent a complicated cyst. Recommend follow-up renal ultrasound in 6 months to assess for stability. Electronically Signed   By: Annia Belt M.D.   On: 07/09/2023 11:05   DG Chest Port 1 View Result Date: 07/08/2023 CLINICAL DATA:  Chest tube placement EXAM: PORTABLE CHEST 1 VIEW COMPARISON:  07/08/2023 FINDINGS: Interval placement of left basilar chest tube with decrease left pleural effusion. No visible pneumothorax. Minimal residual left basilar atelectasis. Right lung clear. Heart mediastinal contours within normal limits. Left Port-A-Cath and NG tube are unchanged. IMPRESSION: Interval placement of left chest tube with decreasing left pleural effusion. Minimal residual left base atelectasis. No pneumothorax. Electronically Signed   By: Charlett Nose M.D.   On: 07/08/2023 14:33   ECHOCARDIOGRAM COMPLETE Result Date: 07/08/2023    ECHOCARDIOGRAM REPORT   Patient Name:   Randy Allen Date of Exam: 07/08/2023 Medical Rec #:  [id number]    Height:       68.0 in Accession #:    [id number]   Weight:       136.0 lb Date of Birth:  1960/06/01   BSA:          1.735 m Patient Age:     63 years     BP:           96/63 mmHg Patient Gender: M            HR:           109 bpm. Exam Location:  Inpatient Procedure: 2D Echo, Cardiac Doppler, Color Doppler and Intracardiac            Opacification Agent (Both Spectral and Color Flow Doppler were            utilized  during procedure). STAT ECHO Indications:    Shock  History:        Patient has prior history of Echocardiogram examinations, most                 recent 06/30/2023. Aortic Valve Disease; Risk                 Factors:Hypertension. Cancer. Bicudspid aortic valve. Ascending                 aneurysm.  Sonographer:    Sheralyn Boatman RDCS Referring Phys: [id number] [facility]  Sonographer Comments: Technically difficult study due to poor echo windows and echo performed with patient supine and on artificial respirator. IMPRESSIONS  1. Large pleural effusion in the left lateral region. There is secondary, compressive, atelectasis.  2. Stat echo, unable to get on axis gradient. Unclear degree of aortic stenosis. The aortic valve was not well visualized. Aortic valve regurgitation is trivial.  3. Left ventricular ejection fraction, by estimation, is 70 to 75%. The left ventricle has hyperdynamic function. Left ventricular endocardial border not optimally defined to evaluate regional wall motion. Left ventricular diastolic parameters are indeterminate.  4. Aortic dilatation noted. There is moderate dilatation of the ascending aorta, measuring 46 mm.  5. The mitral valve was not well visualized. No evidence of mitral valve regurgitation.  6. The inferior vena cava is normal in size with greater than 50% respiratory variability, suggesting right atrial pressure of 3 mmHg.  7. Right ventricular systolic function was not well visualized. The right ventricular size is moderately enlarged. Comparison(s): Prior images reviewed side by side. Technically challenging study. Aortic stenosis is likely unchanged from recent echo. Pleural effusion is large and new.  FINDINGS  Left Ventricle: Left ventricular ejection fraction, by estimation, is 70 to 75%. The left ventricle has hyperdynamic function. Left ventricular endocardial border not optimally defined to evaluate regional wall motion. Definity contrast agent was given IV to delineate the left ventricular endocardial borders. Strain was performed and the global longitudinal strain is indeterminate. The left ventricular internal cavity size was small. There is no left ventricular hypertrophy. Left ventricular diastolic parameters are indeterminate. Right Ventricle: The right ventricular size is moderately enlarged. Right vetricular wall thickness was not well visualized. Right ventricular systolic function was not well visualized. Left Atrium: Left atrial size was normal in size. Right Atrium: Right atrial size was normal in size. Pericardium: There is no evidence of pericardial effusion. Mitral Valve: The mitral valve was not well visualized. No evidence of mitral valve regurgitation. Tricuspid Valve: The tricuspid valve is normal in structure. Tricuspid valve regurgitation is not demonstrated. No evidence of tricuspid stenosis. Aortic Valve: Stat echo, unable to get on axis gradient. Unclear degree of aortic stenosis. The aortic valve was not well visualized. Aortic valve regurgitation is trivial. Aortic valve mean gradient measures 6.5 mmHg. Aortic valve peak gradient measures  12.5 mmHg. Aortic valve area, by VTI measures 1.71 cm. Pulmonic Valve: The pulmonic valve was normal in structure. Pulmonic valve regurgitation is not visualized. No evidence of pulmonic stenosis. Aorta: Aortic dilatation noted. There is moderate dilatation of the ascending aorta, measuring 46 mm. Venous: The inferior vena cava is normal in size with greater than 50% respiratory variability, suggesting right atrial pressure of 3 mmHg. IAS/Shunts: The interatrial septum was not well visualized. Additional Comments: 3D was performed not requiring  image post processing on an independent workstation and was indeterminate. There is a large pleural effusion in the left  lateral region.  LEFT VENTRICLE PLAX 2D LVIDd:         3.80 cm      Diastology LVIDs:         3.00 cm      LV e' medial:    7.62 cm/s LV PW:         1.15 cm      LV E/e' medial:  5.4 LV IVS:        1.15 cm      LV e' lateral:   7.18 cm/s LVOT diam:     2.10 cm      LV E/e' lateral: 5.7 LV SV:         39 LV SV Index:   22 LVOT Area:     3.46 cm  LV Volumes (MOD) LV vol d, MOD A2C: 73.8 ml LV vol d, MOD A4C: 107.0 ml LV vol s, MOD A2C: 43.8 ml LV vol s, MOD A4C: 54.7 ml LV SV MOD A2C:     30.0 ml LV SV MOD A4C:     107.0 ml LV SV MOD BP:      46.1 ml RIGHT VENTRICLE             IVC RV S prime:     13.70 cm/s  IVC diam: 1.60 cm LEFT ATRIUM             Index        RIGHT ATRIUM          Index LA diam:        2.70 cm 1.56 cm/m   RA Area:     6.12 cm LA Vol (A2C):   18.5 ml 10.66 ml/m  RA Volume:   8.34 ml  4.81 ml/m LA Vol (A4C):   9.2 ml  5.29 ml/m LA Biplane Vol: 13.8 ml 7.95 ml/m  AORTIC VALVE AV Area (Vmax):    1.72 cm AV Area (Vmean):   1.74 cm AV Area (VTI):     1.71 cm AV Vmax:           176.50 cm/s AV Vmean:          114.250 cm/s AV VTI:            0.227 m AV Peak Grad:      12.5 mmHg AV Mean Grad:      6.5 mmHg LVOT Vmax:         87.40 cm/s LVOT Vmean:        57.300 cm/s LVOT VTI:          0.112 m LVOT/AV VTI ratio: 0.49  AORTA Ao Asc diam: 4.60 cm MITRAL VALVE MV Area (PHT): 4.68 cm    SHUNTS MV Decel Time: 162 msec    Systemic VTI:  0.11 m MV E velocity: 40.90 cm/s  Systemic Diam: 2.10 cm MV A velocity: 61.30 cm/s MV E/A ratio:  0.67 Riley Lam MD Electronically signed by Riley Lam MD Signature Date/Time: 07/08/2023/12:51:59 PM    Final    [place] EKG SITE RITE Result Date: 07/08/2023 If Site Rite image not attached, placement could not be confirmed due to current cardiac rhythm.   Anti-infectives: Anti-infectives (From admission, onward)    Start      Dose/Rate Route Frequency Ordered Stop   06/21/2023 0930  fluconazole (DIFLUCAN) IVPB 200 mg  Status:  Discontinued       Placed in "Followed by" Linked Group   200 mg 100 mL/hr over 60 Minutes Intravenous  Every 24 hours 07/09/23 0841 07/09/23 1509   07/11/2023 0930  fluconazole (DIFLUCAN) IVPB 400 mg       Placed in "Followed by" Linked Group   400 mg 100 mL/hr over 120 Minutes Intravenous Every 24 hours 07/09/23 1509     07/09/23 2200  meropenem (MERREM) 1 g in sodium chloride 0.9 % 100 mL IVPB        1 g 200 mL/hr over 30 Minutes Intravenous Every 8 hours 07/09/23 1509     07/09/23 0930  fluconazole (DIFLUCAN) IVPB 400 mg       Placed in "Followed by" Linked Group   400 mg 100 mL/hr over 120 Minutes Intravenous  Once 07/09/23 0841 07/09/23 1212   07/08/23 1200  vancomycin (VANCOCIN) IVPB 1000 mg/200 mL premix        1,000 mg 200 mL/hr over 60 Minutes Intravenous Every 24 hours 07/07/23 2030     07/08/23 0800  meropenem (MERREM) 1 g in sodium chloride 0.9 % 100 mL IVPB  Status:  Discontinued        1 g 200 mL/hr over 30 Minutes Intravenous Every 12 hours 07/07/23 2030 07/09/23 1509   07/07/23 2300  vancomycin (VANCOCIN) IVPB 1000 mg/200 mL premix  Status:  Discontinued        1,000 mg 200 mL/hr over 60 Minutes Intravenous Every 12 hours 07/07/23 0954 07/07/23 2030   07/07/23 1800  meropenem (MERREM) 1 g in sodium chloride 0.9 % 100 mL IVPB  Status:  Discontinued        1 g 200 mL/hr over 30 Minutes Intravenous Every 8 hours 07/07/23 1758 07/07/23 2030   07/07/23 1045  ceFEPIme (MAXIPIME) 2 g in sodium chloride 0.9 % 100 mL IVPB  Status:  Discontinued        2 g 200 mL/hr over 30 Minutes Intravenous Every 8 hours 07/07/23 0954 07/07/23 1750   07/07/23 1045  vancomycin (VANCOREADY) IVPB 1250 mg/250 mL        1,250 mg 166.7 mL/hr over 90 Minutes Intravenous  Once 07/07/23 0954 07/07/23 1309   07/02/23 2200  ceFAZolin (ANCEF) IVPB 2g/100 mL premix        2 g 200 mL/hr over 30 Minutes  Intravenous Every 8 hours 07/02/23 1814 07/02/23 2240   07/02/23 0730  ceFAZolin (ANCEF) IVPB 2g/100 mL premix        2 g 200 mL/hr over 30 Minutes Intravenous On call to O.R. 07/02/23 0719 07/02/23 1322      Pathology 07/02/23 A. LIVER, BIOPSY:  - Benign liver parenchyma, negative for carcinoma   B. WHIPPLE PROCEDURE WITH GALLBLADDER AND STENT:  - Foci of residual invasive poorly differentiated adenocarcinoma  - Distal pancreatic resection margin is focally positive for carcinoma,  see comment  - Carcinoma focally invades into peripancreatic soft tissue  - No evidence of lymphovascular invasion  - Seven lymph nodes, negative for carcinoma (0/7)  - Gallbladder with chronic cholecystitis  - See oncology table   C. OMENTUM, RESECTION:  - Portion of omentum, negative for carcinoma    Assessment/Plan: Adenocarcinoma head of pancreas, cT2N0M0, s/p neoadjuvant chemotherapy POD 7 S/p whipple; [id number]  Patient developed MSOF including shock liver.   Mental status decreased and GCS 3.  CCM did not see cough or gag on exam.   Planned: Send stat head CT to evaluate for cerebral edema.   CTA chest to eval for PE/right heart strain as potential cause for shock liver.  Repeat CT abdomen to eval for  any evidence of intestinal ischemia.   Reviewed CT from Monday again and good blood flow to liver seen at that point.    Septic shock Pt made DNR AM 3/27 after dramatic worsening clinical status.      **Unfortunately as patient went down for above scans, he went into v fib arrest in CT after head CT was done.  Given DNR status, no code was performed and he was pronounced dead.      LOS: 8 days   Maudry Diego, MD, FACS, FSSO Surgical Oncology and General Surgery [facility], [place] [phone] for weekday/non holidays Check [email or web address] for coverage night/weekend/holidays   07/12/2023

## 2023-07-15 NOTE — Progress Notes (Signed)
 OT Cancellation Note  Patient Details Name: Randy Allen MRN: [id number] DOB: 1960/06/07   Cancelled Treatment:    Reason Eval/Treat Not Completed: Medical issues which prohibited therapy.  Patient with significant medical decline.  OT to sign off, await further testing, and new orders.    [name] D Daisy Mcneel 07/03/2023, 9:13 AM 07/07/2023  RP, OTR/L  Acute Rehabilitation Services  Office:  [phone]

## 2023-07-15 NOTE — Death Summary Note (Signed)
 DEATH SUMMARY   Patient Details  Name: Randy Allen MRN: [id number] DOB: 01/18/1961  Admission/Discharge Information   Admit Date:  07/17/2023  Date of Death: Date of Death: 07/25/2023  Time of Death: Time of Death: 1054-07-28  Length of Stay: 8  Referring Physician: Lucky Cowboy, MD   Reason(s) for Hospitalization  Surgery for pancreatic cancer - whipple.    Diagnoses  Preliminary cause of death: Ventricular fibrillation (HCC) Secondary Diagnoses (including complications and co-morbidities):  Principal Problem:   Cancer of head of pancreas (HCC) Active Problems:   H/O Whipple procedure   Protein-calorie malnutrition, severe   Shock (HCC)   Pleural effusion, left   Lactic acidosis   Acute renal failure (ARF) (HCC)   Hyperkalemia   Anemia due to multiple mechanisms   DIC (disseminated intravascular coagulation) (HCC)   Sepsis (HCC)   Respiratory failure, acute (HCC)   Thrombocytopenia (HCC)   Brief Hospital Course (including significant findings, care, treatment, and services provided and events leading to death)  Randy Allen is a 63 y.o. year old male who was admitted to the ICU following a classic Whipple procedure with feeding jejunostomy 07-17-23.  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  He started out very anemic from his neoadjuvant chemotherapy.  Whipple was uneventful with 450 cc blood loss.  He did have some mild hypotension the first night which resolved with temporary cessation of his epidural.  The epidural was resumed at a lower rate and he was given an albumin bolus and this helped.    NGT output was slightly bloody, but was not high.  NG tube was pulled on postop day 2 and he was allowed to have clears.  He was a bit nauseated and so did not take in very much.   He did require several additional units of blood over the first few days postoperatively.  Drain output was serosanguineous from the medial one and the more like old blood from  the lateral one.  The patient had a Dilaudid PCA to assist with pain control.  He was able to get out of bed.    On postop day 4-5, he started developing tachycardia and some shortness of breath.  Liver function test were elevated again.  They had trended down to a normal bilirubin and almost normal transaminase levels.   Chest x-ray showed pleural effusion and potential infiltrate.  He was placed on cefepime and bank for presumed hospital-acquired pneumonia.  Over the course of that day, he continued to remain tachycardic and became more tachypneic and appeared to be tiring out.  He had an event where his blood pressure dropped and did not code but had a near code event at that time.  He was throwing up some blood and was felt to have a GI bleed.    He was intubated, placed on pressors, and had additional fluid resuscitation including additional blood products.  He had dramatic output from his drains at that point.  Approximately 2 L of old foul-smelling bloody drainage came out both of his drains.  This slowed down back to normal amounts from the over 12-18 hours.  At that point, the lateral drain was definitely bile stained.  On postop day 5 after the intubation, he went down for CTA at scan of the abdomen.  This did show moderate size.  Splenic fluid collection with mild wall enhancement.  This abutted the greater curve of the stomach.  There was some proximal jejunal  dilation upstream of the J-tube.  He seemed to have some colonic inflammation and very small amount of free fluid through the abdomen.  He was also seen to have bilateral effusions with the left much larger than the right. The next morning, his Levophed has been weaned a bit and his heart rate had come down.  Critical care was consulted and broadened his antibiotic from cefepime to meropenem.  I added fluconazole that morning as well.   On postop day 6, a echocardiogram was performed and a chest tube was placed on the left by CCM.  The fluid  was murky and was sent for Gram stain and culture.  Culture was negative.  The Levophed was decreased and he was given some additional fluid.  He is was added on low-dose vasopressin as he was felt to be septic.  Drains were sent for amylase which did not come back at that day, but which was negative for pancreatic leak.  On postop day 7, he was significantly oliguric and bumped his creatinine to 2.98.  He also was hyperkalemic with a K of 6.4.  He is getting Kayexalate through his orogastric tube.  Nephrology was consulted.  The Kayexalate was not working to get his potassium down, so continuous renal replacement therapy was started.  His pressors had been weaned some more at that point with the vasopressin down to 0.2 and the Levophed down to 12.  Later in the day, he continued to be severely acidotic and continue to have issues clearing his potassium, he was reevaluated in the afternoon by one of my colleagues and was felt not to have an acute abdomen.  Abdomen was soft and he had had a bowel movement.  Also the CT from 2 days before was reassuring.  Overnight from postop day 7 to postop day 8, he significantly worsened.  Coags were performed and showed significantly elevated INR up to 9.  Lactate also was back up to 9.  Platelet count dropped and 30,000.  LFTs skyrocketed with AST >7000.  The patient had a GCS of 3 that morning as well.  He did not have any gag or cough reflex.  He developed hematoma that settled out into his entire abdominal wall.  He had significant anasarca. Ammonia was 149.  Originally he was a full code, but as the situation worsened on postop day 7, he was made a DNR as it was felt that he did not have anything we could make any better than what we are already doing.  Decision was made to to get a CT scan of the head to evaluate for cerebral anemia.  Along with the chest abdomen pelvis.  It was felt that if he had a potential intra-abdominal issue that could be corrected surgically,  then it might be worthwhile to give him vitamin K, FFP, and platelets, and attempt to get him to the operating room.  Also, if he had cerebral edema that would also be helpful to know and this would likely negate any operative plans.  He is taken off the CRRT temporarily to go down for the CAT scans.  CT of the head was able to be performed, but as he was laid completely flat for the chest abdomen pelvis, he went into ventricular fibrillation arrest.  As he was DNR at that point, a code was not performed and he was pronounced dead.    Pertinent Labs and Studies  Significant Diagnostic Studies CT HEAD WO CONTRAST ( ) Result Date: 07/11/2023  CLINICAL DATA:  Altered mental status after Whipple EXAM: CT HEAD WITHOUT CONTRAST TECHNIQUE: Contiguous axial images were obtained from the base of the skull through the vertex without intravenous contrast. RADIATION DOSE REDUCTION: This exam was performed according to the departmental dose-optimization program which includes automated exposure control, adjustment of the mA and/or kV according to patient size and/or use of iterative reconstruction technique. COMPARISON:  None Available. FINDINGS: Brain: No evidence of acute infarction, hemorrhage, hydrocephalus, extra-axial collection or mass lesion/mass effect. Vascular: No hyperdense vessel or unexpected calcification. Skull: Normal. Negative for fracture or focal lesion. Sinuses/Orbits: No acute finding. IMPRESSION: Unremarkable CT of the brain. Electronically Signed   By: Tiburcio Pea M.D.   On: 07/08/2023 11:35   DG CHEST PORT 1 VIEW Result Date: 07/09/2023 CLINICAL DATA:  Central line placement EXAM: PORTABLE CHEST 1 VIEW COMPARISON:  07/08/2023 FINDINGS: Interval placement of right IJ approach central venous catheter with distal tip at the level of the distal SVC. Interval placement of right PICC line with distal tip at the superior cavoatrial junction. Left chest port is stable in positioning. ET tube  terminates 4.7 cm above the carina. Enteric tube extends into the stomach. Left chest tube at the periphery of the left lung base. Normal heart size. Minimal bibasilar atelectasis. Small right pleural effusion. No definite pneumothorax although a small portion of the left lung apex was not included within the field of view. IMPRESSION: 1. Interval placement of right IJ approach central venous catheter with distal tip at the level of the distal SVC. 2. Interval placement of right PICC line with distal tip at the superior cavoatrial junction. 3. No definite pneumothorax although a small portion of the left lung apex was not included within the field of view. 4. Small right pleural effusion. Electronically Signed   By: Duanne Guess D.O.   On: 07/09/2023 16:46   US RENAL Result Date: 07/09/2023 CLINICAL DATA:  Acute renal injury EXAM: RENAL / URINARY TRACT ULTRASOUND COMPLETE COMPARISON:  CT GI bleed 07/11/2023 FINDINGS: Right Kidney: Renal measurements: 10.7 x 11.9 x 4.4 cm = volume: 115 mL. Echogenicity within normal limits. No mass or hydronephrosis visualized. Left Kidney: Renal measurements: 11.0 x 4.8 x 4.4 cm = volume: 122 mL. Normal renal cortical thickness and echogenicity. No hydronephrosis. Trace perinephric fluid. There is a 9 mm hypoechoic mass, partially exophytic inferior pole left kidney. Bladder: Not visualized due to overlying bowel gas. Other: Trace perihepatic fluid. IMPRESSION: 1. No hydronephrosis. 2. Trace left perinephric fluid. 3. Trace perihepatic fluid. 4. There is a 9 mm hypoechoic mass, partially exophytic inferior pole left kidney. This may represent a complicated cyst. Recommend follow-up renal ultrasound in 6 months to assess for stability. Electronically Signed   By: Annia Belt M.D.   On: 07/09/2023 11:05   DG Chest Port 1 View Result Date: 07/08/2023 CLINICAL DATA:  Chest tube placement EXAM: PORTABLE CHEST 1 VIEW COMPARISON:  07/08/2023 FINDINGS: Interval placement of left  basilar chest tube with decrease left pleural effusion. No visible pneumothorax. Minimal residual left basilar atelectasis. Right lung clear. Heart mediastinal contours within normal limits. Left Port-A-Cath and NG tube are unchanged. IMPRESSION: Interval placement of left chest tube with decreasing left pleural effusion. Minimal residual left base atelectasis. No pneumothorax. Electronically Signed   By: Charlett Nose M.D.   On: 07/08/2023 14:33   ECHOCARDIOGRAM COMPLETE Result Date: 07/08/2023    ECHOCARDIOGRAM REPORT   Patient Name:   DONYE CAMPANELLI Surace Date of Exam: 07/08/2023 Medical Rec #:  [id number]    Height:       68.0 in Accession #:    [id number]   Weight:       136.0 lb Date of Birth:  27-Jan-1961   BSA:          1.735 m Patient Age:    62 years     BP:           96/63 mmHg Patient Gender: M            HR:           109 bpm. Exam Location:  Inpatient Procedure: 2D Echo, Cardiac Doppler, Color Doppler and Intracardiac            Opacification Agent (Both Spectral and Color Flow Doppler were            utilized during procedure). STAT ECHO Indications:    Shock  History:        Patient has prior history of Echocardiogram examinations, most                 recent 06/30/2023. Aortic Valve Disease; Risk                 Factors:Hypertension. Cancer. Bicudspid aortic valve. Ascending                 aneurysm.  Sonographer:    Sheralyn Boatman RDCS Referring Phys: [id number] [facility]  Sonographer Comments: Technically difficult study due to poor echo windows and echo performed with patient supine and on artificial respirator. IMPRESSIONS  1. Large pleural effusion in the left lateral region. There is secondary, compressive, atelectasis.  2. Stat echo, unable to get on axis gradient. Unclear degree of aortic stenosis. The aortic valve was not well visualized. Aortic valve regurgitation is trivial.  3. Left ventricular ejection fraction, by estimation, is 70 to 75%. The left ventricle has hyperdynamic function. Left  ventricular endocardial border not optimally defined to evaluate regional wall motion. Left ventricular diastolic parameters are indeterminate.  4. Aortic dilatation noted. There is moderate dilatation of the ascending aorta, measuring 46 mm.  5. The mitral valve was not well visualized. No evidence of mitral valve regurgitation.  6. The inferior vena cava is normal in size with greater than 50% respiratory variability, suggesting right atrial pressure of 3 mmHg.  7. Right ventricular systolic function was not well visualized. The right ventricular size is moderately enlarged. Comparison(s): Prior images reviewed side by side. Technically challenging study. Aortic stenosis is likely unchanged from recent echo. Pleural effusion is large and new. FINDINGS  Left Ventricle: Left ventricular ejection fraction, by estimation, is 70 to 75%. The left ventricle has hyperdynamic function. Left ventricular endocardial border not optimally defined to evaluate regional wall motion. Definity contrast agent was given IV to delineate the left ventricular endocardial borders. Strain was performed and the global longitudinal strain is indeterminate. The left ventricular internal cavity size was small. There is no left ventricular hypertrophy. Left ventricular diastolic parameters are indeterminate. Right Ventricle: The right ventricular size is moderately enlarged. Right vetricular wall thickness was not well visualized. Right ventricular systolic function was not well visualized. Left Atrium: Left atrial size was normal in size. Right Atrium: Right atrial size was normal in size. Pericardium: There is no evidence of pericardial effusion. Mitral Valve: The mitral valve was not well visualized. No evidence of mitral valve regurgitation. Tricuspid Valve: The tricuspid valve is normal in structure. Tricuspid valve regurgitation is not demonstrated. No evidence  of tricuspid stenosis. Aortic Valve: Stat echo, unable to get on axis  gradient. Unclear degree of aortic stenosis. The aortic valve was not well visualized. Aortic valve regurgitation is trivial. Aortic valve mean gradient measures 6.5 mmHg. Aortic valve peak gradient measures  12.5 mmHg. Aortic valve area, by VTI measures 1.71 cm. Pulmonic Valve: The pulmonic valve was normal in structure. Pulmonic valve regurgitation is not visualized. No evidence of pulmonic stenosis. Aorta: Aortic dilatation noted. There is moderate dilatation of the ascending aorta, measuring 46 mm. Venous: The inferior vena cava is normal in size with greater than 50% respiratory variability, suggesting right atrial pressure of 3 mmHg. IAS/Shunts: The interatrial septum was not well visualized. Additional Comments: 3D was performed not requiring image post processing on an independent workstation and was indeterminate. There is a large pleural effusion in the left lateral region.  LEFT VENTRICLE PLAX 2D LVIDd:         3.80 cm      Diastology LVIDs:         3.00 cm      LV e' medial:    7.62 cm/s LV PW:         1.15 cm      LV E/e' medial:  5.4 LV IVS:        1.15 cm      LV e' lateral:   7.18 cm/s LVOT diam:     2.10 cm      LV E/e' lateral: 5.7 LV SV:         39 LV SV Index:   22 LVOT Area:     3.46 cm  LV Volumes (MOD) LV vol d, MOD A2C: 73.8 ml LV vol d, MOD A4C: 107.0 ml LV vol s, MOD A2C: 43.8 ml LV vol s, MOD A4C: 54.7 ml LV SV MOD A2C:     30.0 ml LV SV MOD A4C:     107.0 ml LV SV MOD BP:      46.1 ml RIGHT VENTRICLE             IVC RV S prime:     13.70 cm/s  IVC diam: 1.60 cm LEFT ATRIUM             Index        RIGHT ATRIUM          Index LA diam:        2.70 cm 1.56 cm/m   RA Area:     6.12 cm LA Vol (A2C):   18.5 ml 10.66 ml/m  RA Volume:   8.34 ml  4.81 ml/m LA Vol (A4C):   9.2 ml  5.29 ml/m LA Biplane Vol: 13.8 ml 7.95 ml/m  AORTIC VALVE AV Area (Vmax):    1.72 cm AV Area (Vmean):   1.74 cm AV Area (VTI):     1.71 cm AV Vmax:           176.50 cm/s AV Vmean:          114.250 cm/s AV VTI:             0.227 m AV Peak Grad:      12.5 mmHg AV Mean Grad:      6.5 mmHg LVOT Vmax:         87.40 cm/s LVOT Vmean:        57.300 cm/s LVOT VTI:          0.112 m LVOT/AV VTI ratio: 0.49  AORTA Ao Asc diam:  4.60 cm MITRAL VALVE MV Area (PHT): 4.68 cm    SHUNTS MV Decel Time: 162 msec    Systemic VTI:  0.11 m MV E velocity: 40.90 cm/s  Systemic Diam: 2.10 cm MV A velocity: 61.30 cm/s MV E/A ratio:  0.67 Riley Lam MD Electronically signed by Riley Lam MD Signature Date/Time: 07/08/2023/12:51:59 PM    Final    [place] EKG SITE RITE Result Date: 07/08/2023 If Site Rite image not attached, placement could not be confirmed due to current cardiac rhythm.  Portable Chest xray Result Date: 07/08/2023 CLINICAL DATA:  54098 Respiratory failure (HCC) [id number] EXAM: PORTABLE CHEST 1 VIEW COMPARISON:  07/07/2023 FINDINGS: Interval placement of enteric tube with distal tip extending beyond the inferior margin of the film. Left chest port and endotracheal tube remain appropriately positioned. Normal heart size. Moderate left and trace right pleural effusions with associated bibasilar opacities. No pneumothorax is identified. IMPRESSION: Moderate left and trace right pleural effusions with associated bibasilar opacities, progressed from prior. Electronically Signed   By: Duanne Guess D.O.   On: 07/08/2023 10:46   DG Abd Portable 1V Result Date: 07/07/2023 CLINICAL DATA:  Check gastric catheter placement EXAM: PORTABLE ABDOMEN - 1 VIEW COMPARISON:  None Available. FINDINGS: Gastric catheter is coiled within the stomach. Postsurgical changes are seen. No free air is noted. Small left effusion is seen. IMPRESSION: Gastric catheter within the stomach. Electronically Signed   By: Alcide Clever M.D.   On: 07/07/2023 22:28   CT ANGIO GI BLEED Addendum Date: 07/07/2023 ADDENDUM REPORT: 07/07/2023 21:26 ADDENDUM: These results were called by telephone at the time of interpretation on 07/07/2023 at 9:25 pm to  provider Dr. Sheliah Hatch, who verbally acknowledged these results. Electronically Signed   By: Darliss Cheney M.D.   On: 07/07/2023 21:26   Result Date: 07/07/2023 CLINICAL DATA:  Status post Whipple with recent anastomotic bleed. Blood in peritoneal cavity. EXAM: CTA ABDOMEN AND PELVIS WITHOUT AND WITH CONTRAST TECHNIQUE: Multidetector CT imaging of the abdomen and pelvis was performed using the standard protocol during bolus administration of intravenous contrast. Multiplanar reconstructed images and MIPs were obtained and reviewed to evaluate the vascular anatomy. RADIATION DOSE REDUCTION: This exam was performed according to the departmental dose-optimization program which includes automated exposure control, adjustment of the mA and/or kV according to patient size and/or use of iterative reconstruction technique. CONTRAST:  OMNIPAQUE IOHEXOL 350 MG/ML SOLN COMPARISON:  CT abdomen and pelvis 05/19/2023. CT angiogram abdomen 08/16/2020. FINDINGS: VASCULAR Aorta: Normal caliber aorta without aneurysm, dissection, vasculitis or significant stenosis. Celiac: Patent without evidence of aneurysm, dissection, vasculitis or significant stenosis. 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 The remaining superior mesenteric artery appears within normal limits. Findings are similar to 2022. Renals: Both renal arteries are patent without evidence of aneurysm, dissection, vasculitis, fibromuscular dysplasia or significant stenosis. IMA: Patent without evidence of aneurysm, dissection, vasculitis or significant stenosis. 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 The external iliac arteries and common iliac arteries appear within normal limits. Right femoral  artery catheter is in place. Proximal Outflow: Bilateral common femoral and visualized portions of the superficial and profunda femoral arteries are patent without evidence of aneurysm, dissection, vasculitis or significant stenosis. Veins: Right common femoral vein catheter is  present with distal tip ending in the inferior IVC. Portal vein, splenic vein and superior mesenteric vein are patent. IVC is patent. Review of the MIP images confirms the above findings. NON-VASCULAR Lower chest: Small right and moderate left pleural effusions are present with compressive atelectasis of the bilateral lower lobes. Hepatobiliary: Gallbladder has been surgically removed. There is minimal fluid and air in the gallbladder fossa without discrete fluid collection. No biliary ductal dilatation. No focal liver lesion. Pancreas: Status post Whipple. Pancreatic body and tail are within normal limits. There is a new pancreatic duct stent in place. Spleen: Normal in size without focal abnormality. There is a subcapsular perisplenic fluid collection with some mild wall enhancement measuring up to 1 cm. There is a small amount of air within this collection as it abuts the upper greater curvature of the stomach. Can not exclude contained perforation of the stomach at this level image 16/53. Adrenals/Urinary Tract: The bladder is decompressed by Foley catheter. There is a subcentimeter cyst in the inferior pole of the left kidney. Otherwise, the kidneys and adrenal glands are within normal limits. Stomach/Bowel: Patient is status post Whipple procedure. There are postsurgical changes in the distal stomach. No active gastrointestinal bleeding identified within the stomach. The stomach is dilated with large air-fluid level. There is also fluid dilatation of the distal esophagus. The duodenal is dilated and fluid-filled measuring up to 3.5 cm percutaneous jejunostomy tube in place. Jejunal loops are distended with fluid. Distal ileal loops are  decompressed. The appendix is within normal limits. There is diffuse colonic wall thickening. No active bleeding identified in the small bowel or colon. Lymphatic: No enlarged lymph nodes are identified. Reproductive: The prostate gland is enlarged. Other: There is a small amount of free fluid throughout the abdomen and pelvis. Two surgical drains are seen in the upper abdomen. There is diffuse body wall edema. Midline skin staples are present. No focal hernia. Musculoskeletal: No fracture is seen. IMPRESSION: 1. No evidence for active gastrointestinal bleeding. 2. 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 3. 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 NON-VASCULAR 1. Moderate-sized subcapsular perisplenic fluid collection with mild wall enhancement. There is a small amount of air within this collection as it abuts the upper greater curvature of the stomach. Can not exclude contained perforation of the stomach at this level. 2. Diffuse gastric and duodenal dilatation with percutaneous jejunostomy tube in place. Findings may represent ileus or obstruction. 3. Diffuse colonic wall thickening worrisome for colitis. 4. Small amount of free fluid throughout the abdomen and pelvis. 5. Diffuse body wall edema. 6. Small right and moderate left pleural effusions with compressive atelectasis of the bilateral lower lobes. Electronically Signed: By: Darliss Cheney M.D. On: 07/07/2023 21:08   DG CHEST PORT 1 VIEW Result Date: 07/07/2023 CLINICAL DATA:  Check endotracheal tube placement EXAM: PORTABLE CHEST 1 VIEW COMPARISON:  07/07/2023 FINDINGS: The tracheal tube is noted in satisfactory position. Left chest wall port is again seen. Lungs are well aerated bilaterally. Slight increased density is noted in the left chest consistent with a posteriorly layering effusion redistributed when  compared with the prior exam. IMPRESSION: Left-sided pleural effusion.  No other focal abnormality is noted. Electronically Signed   By: Alcide Clever M.D.   On: 07/07/2023 19:31   DG Chest Port 1 View Result Date: 07/07/2023 CLINICAL DATA:  Respiratory failure EXAM:  PORTABLE CHEST 1 VIEW COMPARISON:  Chest x-ray 07/07/2023 FINDINGS: Left chest port catheter tip projects over the mid SVC. The heart is mildly enlarged. There is a moderate-sized left pleural effusion which has increased. There is no focal lung consolidation or pneumothorax. There is likely gaseous distention of the upper esophagus. IMPRESSION: 1. Increasing moderate left pleural effusion. 2. Mild cardiomegaly. 3. Likely gaseous distention of the upper esophagus. Electronically Signed   By: Darliss Cheney M.D.   On: 07/07/2023 19:29   DG Abd Portable 1V Result Date: 07/07/2023 CLINICAL DATA:  Abdominal pain EXAM: PORTABLE ABDOMEN - 1 VIEW COMPARISON:  None Available. FINDINGS: Scattered large and small bowel gas is noted. Postsurgical changes are seen. No obstructive changes are noted. No free air is seen. IMPRESSION: No acute abnormality noted. Electronically Signed   By: Alcide Clever M.D.   On: 07/07/2023 19:28   DG Chest Portable 1 View Result Date: 07/07/2023 CLINICAL DATA:  Shortness of breath EXAM: PORTABLE CHEST 1 VIEW COMPARISON:  02/12/2023 FINDINGS: Left chest wall port a catheter noted with tip at the distal SVC. Stable cardiomediastinal contours. New small bilateral pleural effusions are identified, left greater than right. Decreased aeration to the left lower lobe may reflect atelectasis or airspace disease. Visualized osseous structures are unremarkable. IMPRESSION: 1. New small bilateral pleural effusions, left greater than right. 2. Decreased aeration to the left lower lobe may reflect atelectasis or airspace disease. Electronically Signed   By: Signa Kell M.D.   On: 07/07/2023 07:21   ECHOCARDIOGRAM COMPLETE Result Date:  06/30/2023    ECHOCARDIOGRAM REPORT   Patient Name:   FITZROY MIKAMI Dipierro  Date of Exam: 06/30/2023 Medical Rec #:  [id number]     Height:       68.0 in Accession #:    [id number]    Weight:       130.5 lb Date of Birth:  1960-12-26    BSA:          1.705 m Patient Age:    62 years      BP:           119/82 mmHg Patient Gender: M             HR:           72 bpm. Exam Location:  [address] Procedure: 2D Echo, 3D Echo and Strain Analysis (Both Spectral and Color Flow            Doppler were utilized during procedure). Indications:    Q23.1 Bicuspid aortic valve  History:        Patient has prior history of Echocardiogram examinations, most                 recent 06/21/2022. Risk Factors:Hypertension and Dyslipidemia.                 Hypothyroidism. Ascending aortic aneurysm.  Sonographer:    Cathie Beams RCS Referring Phys: [id number] PAULA V ROSS  Sonographer Comments: Consulted with DOD in reference to ascending aorta diameter. Patient discharged per DOD, Dr. Elease Hashimoto. Known aortic dilatation. IMPRESSIONS  1. Left ventricular ejection fraction, by estimation, is 60 to 65%. Left ventricular ejection fraction by 3D volume is 62 %. The left ventricle has normal function. The left ventricle has no regional wall motion abnormalities. There is mild left ventricular hypertrophy of the septal segment. Left ventricular diastolic parameters are consistent with Grade I diastolic dysfunction (impaired relaxation). The average left ventricular global longitudinal strain is -  18.3 %. The global longitudinal strain is normal.  2. Right ventricular systolic function is normal. The right ventricular size is normal. There is normal pulmonary artery systolic pressure. The estimated right ventricular systolic pressure is 16.5 mmHg.  3. The mitral valve is normal in structure. No evidence of mitral valve regurgitation. No evidence of mitral stenosis.  4. Tricuspid valve regurgitation is mild to moderate.  5. DI - 0.30. The aortic valve is bicuspid.  There is moderate calcification of the aortic valve. There is moderate thickening of the aortic valve. Aortic valve regurgitation is mild. Moderate aortic valve stenosis. Aortic valve area, by VTI measures 0.99 cm. Aortic valve mean gradient measures 25.0 mmHg. Aortic valve Vmax measures 3.51 m/s.  6. Aortic dilatation noted. Aneurysm of the ascending aorta, measuring 50 mm.  7. The inferior vena cava is normal in size with greater than 50% respiratory variability, suggesting right atrial pressure of 3 mmHg. Comparison(s): Prior images reviewed side by side. Prior ascending aorta 52 mm in 06/21/22. FINDINGS  Left Ventricle: Left ventricular ejection fraction, by estimation, is 60 to 65%. Left ventricular ejection fraction by 3D volume is 62 %. The left ventricle has normal function. The left ventricle has no regional wall motion abnormalities. The average left ventricular global longitudinal strain is -18.3 %. Strain was performed and the global longitudinal strain is normal. The left ventricular internal cavity size was normal in size. There is mild left ventricular hypertrophy of the septal segment. Left ventricular diastolic parameters are consistent with Grade I diastolic dysfunction (impaired relaxation). Right Ventricle: The right ventricular size is normal. No increase in right ventricular wall thickness. Right ventricular systolic function is normal. There is normal pulmonary artery systolic pressure. The tricuspid regurgitant velocity is 1.84 m/s, and  with an assumed right atrial pressure of 3 mmHg, the estimated right ventricular systolic pressure is 16.5 mmHg. Left Atrium: Left atrial size was normal in size. Right Atrium: Right atrial size was normal in size. Pericardium: There is no evidence of pericardial effusion. Mitral Valve: The mitral valve is normal in structure. No evidence of mitral valve regurgitation. No evidence of mitral valve stenosis. Tricuspid Valve: The tricuspid valve is normal in  structure. Tricuspid valve regurgitation is mild to moderate. No evidence of tricuspid stenosis. Aortic Valve: DI - 0.30. The aortic valve is bicuspid. There is moderate calcification of the aortic valve. There is moderate thickening of the aortic valve. Aortic valve regurgitation is mild. Moderate aortic stenosis is present. Aortic valve mean gradient measures 25.0 mmHg. Aortic valve peak gradient measures 49.3 mmHg. Aortic valve area, by VTI measures 0.99 cm. Pulmonic Valve: The pulmonic valve was normal in structure. Pulmonic valve regurgitation is mild to moderate. No evidence of pulmonic stenosis. Aorta: Aortic dilatation noted. There is an aneurysm involving the ascending aorta measuring 50 mm. Venous: The inferior vena cava is normal in size with greater than 50% respiratory variability, suggesting right atrial pressure of 3 mmHg. IAS/Shunts: No atrial level shunt detected by color flow Doppler. Additional Comments: 3D was performed not requiring image post processing on an independent workstation and was normal.  LEFT VENTRICLE PLAX 2D LVIDd:         4.80 cm         Diastology LVIDs:         2.70 cm         LV e' medial:    7.29 cm/s LV PW:         1.10 cm  LV E/e' medial:  9.5 LV IVS:        1.20 cm         LV e' lateral:   7.07 cm/s LVOT diam:     2.00 cm         LV E/e' lateral: 9.8 LV SV:         76 LV SV Index:   44              2D Longitudinal LVOT Area:     3.14 cm        Strain                                2D Strain GLS   -19.1 %                                (A4C):                                2D Strain GLS   -18.1 %                                (A3C):                                2D Strain GLS   -17.8 %                                (A2C):                                2D Strain GLS   -18.3 %                                Avg:                                 3D Volume EF                                LV 3D EF:    Left                                             ventricul                                              ar                                             ejection  fraction                                             by 3D                                             volume is                                             62 %.                                 3D Volume EF:                                3D EF:        62 %                                LV EDV:       137 ml                                LV ESV:       53 ml                                LV SV:        84 ml RIGHT VENTRICLE RV Basal diam:  3.10 cm TAPSE (M-mode): 2.4 cm RVSP:           16.5 mmHg LEFT ATRIUM             Index        RIGHT ATRIUM           Index LA diam:        3.40 cm 1.99 cm/m   RA Pressure: 3.00 mmHg LA Vol (A2C):   35.1 ml 20.59 ml/m  RA Area:     10.80 cm LA Vol (A4C):   27.4 ml 16.07 ml/m  RA Volume:   20.70 ml  12.14 ml/m LA Biplane Vol: 31.8 ml 18.66 ml/m  AORTIC VALVE AV Area (Vmax):    0.96 cm AV Area (Vmean):   0.99 cm AV Area (VTI):     0.99 cm AV Vmax:           351.00 cm/s AV Vmean:          223.000 cm/s AV VTI:            0.766 m AV Peak Grad:      49.3 mmHg AV Mean Grad:      25.0 mmHg LVOT Vmax:         107.00 cm/s LVOT Vmean:        70.600 cm/s LVOT VTI:          0.241 m LVOT/AV VTI ratio: 0.31  AORTA Ao Root diam: 3.80 cm Ao  Asc diam:  5.00 cm MITRAL VALVE               TRICUSPID VALVE MV Area (PHT): 3.28 cm    TR Peak grad:   13.5 mmHg MV Decel Time: 231 msec    TR Vmax:        184.00 cm/s MV E velocity: 69.20 cm/s  Estimated RAP:  3.00 mmHg MV A velocity: 90.70 cm/s  RVSP:           16.5 mmHg MV E/A ratio:  0.76                            SHUNTS                            Systemic VTI:  0.24 m                            Systemic Diam: 2.00 cm Donato Schultz MD Electronically signed by Donato Schultz MD Signature Date/Time: 06/30/2023/11:01:10 AM    Final     Microbiology Recent Results (from the past 240 hours)  MRSA Next Gen by PCR,  Nasal     Status: None   Collection Time: 07/02/23  6:25 PM   Specimen: Nasal Mucosa; Nasal Swab  Result Value Ref Range Status   MRSA by PCR Next Gen NOT DETECTED NOT DETECTED Final    Comment: (NOTE) The GeneXpert MRSA Assay (FDA approved for NASAL specimens only), is one component of a comprehensive MRSA colonization surveillance program. It is not intended to diagnose MRSA infection nor to guide or monitor treatment for MRSA infections. Test performance is not FDA approved in patients less than 35 years old. Performed at [facility], [address], [place], [place] [address]   Culture, blood (Routine X 2) w Reflex to ID Panel     Status: None (Preliminary result)   Collection Time: 07/08/23 10:29 AM   Specimen: BLOOD  Result Value Ref Range Status   Specimen Description BLOOD LEFT ANTECUBITAL  Final   Special Requests   Final    BOTTLES DRAWN AEROBIC ONLY Blood Culture results may not be optimal due to an inadequate volume of blood received in culture bottles   Culture   Final    NO GROWTH 2 DAYS Performed at [facility], [address], [place], [place] [address]    Report Status PENDING  Incomplete  Culture, blood (Routine X 2) w Reflex to ID Panel     Status: None (Preliminary result)   Collection Time: 07/08/23 10:29 AM   Specimen: BLOOD  Result Value Ref Range Status   Specimen Description BLOOD LEFT ANTECUBITAL  Final   Special Requests   Final    BOTTLES DRAWN AEROBIC ONLY Blood Culture results may not be optimal due to an inadequate volume of blood received in culture bottles   Culture   Final    NO GROWTH 2 DAYS Performed at [facility], [address], [place], [place] [address]    Report Status PENDING  Incomplete  Body fluid culture w Gram Stain     Status: None (Preliminary result)   Collection Time: 07/08/23 11:56 AM   Specimen: Path fluid; Pleural Fluid  Result Value Ref Range Status   Specimen Description FLUID  Final   Special  Requests NONE  Final  Gram Stain   Final    FEW WBC PRESENT, PREDOMINANTLY PMN NO ORGANISMS SEEN    Culture   Final    NO GROWTH 2 DAYS Performed at [facility], [address] [place], [place] [address]    Report Status PENDING  Incomplete    Lab Basic Metabolic Panel: Recent Labs  Lab 07/06/23 [phone] 07/07/23 4098 07/07/23 1191 07/07/23 1717 07/08/23 0304 07/09/23 0329 07/09/23 0557 07/09/23 1038 07/09/23 1756 07/09/23 1856 07/14/2023 0403 06/20/2023 0603 07/04/2023 0639 06/18/2023 0909 06/14/2023 0915  NA 139   < > 140   < > 136 131* 132*   < > 134*  --  133* 129*  --  133* 129*  K 3.6   < > 3.6   < > 4.3 6.4* 6.1*   < > 7.3*   < > 6.8* 6.5* 7.2* 6.6* 6.2*  CL 107  --  107   < > 102 101 102  --  102  --  98  --   --  98  --   CO2 24  --  24   < > 19* 11* 10*  --  10*  --  10*  --   --  8*  --   GLUCOSE 150*  --  161*   < > 128* 61* 139*  --  71  --  111*  --   --  103*  --   BUN 21  --  24*   < > 47* 62* 65*  --  58*  --  32*  --   --  25*  --   CREATININE 0.67  --  0.75   < > 1.79* 2.98* 3.15*  --  3.18*  --  2.00*  --   --  1.76*  --   CALCIUM 8.5*  --  8.7*   < > 8.1* 7.1* 6.9*  --  6.6*  --  6.5*  --   --  6.5*  --   MG 2.0  --  1.9  --  1.8 2.3  --   --   --   --  2.9*  --   --   --   --   PHOS 3.7  --  3.8  --  5.6* 10.0*  --   --  10.9*  --  9.8*  --   --   --   --    < > = values in this interval not displayed.   Liver Function Tests: Recent Labs  Lab 07/07/23 0738 07/07/23 1717 07/08/23 0304 07/08/23 1408 07/09/23 0329 07/09/23 1756 06/15/2023 0403  AST 125* 231* 168*  --  857*  --  >7,000*  ALT 64* 113* 95*  --  466*  --  2,398*  ALKPHOS 369* 390* 312*  --  295*  --  472*  BILITOT 4.3* 6.4* 6.9*  --  5.0*  --  5.7*  PROT 5.5* 4.7* 5.0* 4.6* 4.3*  --  RESULTS UNAVAILABLE DUE TO INTERFERING SUBSTANCE  ALBUMIN 3.0* 2.5* 2.6*  --  2.1* 2.0* 2.8*   No results for input(s): "LIPASE", "AMYLASE" in the last 168 hours. Recent Labs  Lab 07/07/2023 0837   AMMONIA 149*   CBC: Recent Labs  Lab 07/07/23 1717 07/07/23 1726 07/07/23 1744 07/08/23 0304 07/08/23 1031 07/09/23 0329 07/09/23 1342 07/09/23 1848 07/05/2023 0403 06/15/2023 0603 06/30/2023 0837 06/24/2023 0915  WBC 10.5 10.8*  --  5.6  --  24.7*  --   --  17.7*  --   --   --   HGB 8.2* 8.2*   < > 14.0   < > 10.7*   < > 8.6* 7.4* 7.1* 7.6* 6.5*  HCT 25.9* 25.8*   < > 42.0   < > 33.3*   < > 27.1* 22.4* 21.0* 22.5* 19.0*  MCV 101.2* 101.2*  --  94.2  --  97.4  --   --  102.8*  --   --   --   PLT 149*  156 157  --  142*  --  80*  --   --  34*  --  27*  --    < > = values in this interval not displayed.   Cardiac Enzymes: No results for input(s): "CKTOTAL", "CKMB", "CKMBINDEX", "TROPONINI" in the last 168 hours. Sepsis Labs: Recent Labs  Lab 07/07/23 1726 07/08/23 0304 07/09/23 0329 07/09/23 1756 07/09/23 2101 06/30/2023 0403 07/03/2023 0800  WBC 10.8* 5.6 24.7*  --   --  17.7*  --   LATICACIDVEN  --  3.6*  --  >9.0* >9.0*  --  >9.0*    Procedures/Operations  Classic pancreaticoduodenectomy (Whipple) with J tube    [name] 06/20/2023, 1:43 PM

## 2023-07-15 NOTE — Progress Notes (Signed)
 Pt taken to CT. Pt went into vfib then asystole. Pt a DNR. NP Anders Simmonds notified and spoke with family in the room. Pt transported back to room. Time of death 49.

## 2023-07-15 NOTE — Progress Notes (Addendum)
 Provider notifications for 3/26 1900 to 3/27 0700 nursing shift:  2030: K 7.2. Dr. Malen Gauze with nephrology notified via phone at 2030. This nurse received verbal orders to (1) give one amp of bicarb, and (2) recheck K in two hours.  2039: INR 9.6. Elink nurse notified of critical, as well as continued dark output from OG tube, dark stools, and dark purple discoloration of patient's lower trunk. While waiting for response from MD, a critical lactic acid of >9 resulted, which was added to the message. Dr. Warrick Parisian called this nurse at 2212, and instructed to repeat the INR to confirm. In addition, he ordered 50 g of 25% albumin, and he instructed this nurse to inform the surgical team of these developments. This nurse then paged and spoke with Dr. Cliffton Asters to relay this information.  2258: K 6.7. Dr. Malen Gauze with nephrology notified via phone at 2300. This nurse received verbal orders to (1) increase dialysate rate to 2.2 L/hr, (2) recheck K in four hours, and (3) notify Dr. Malen Gauze if repeat K is >6.5 or <4.5.  2316: Repeat INR 11.6. Elink called at 2317, and Dr. Warrick Parisian put in the following orders at 2333: (1) Give 10 mg IV Vitamin K, and (2) transfuse 1 unit of FFP.  0337: K 6.6. Dr. Malen Gauze with nephrology notified via phone at [phone]. This nurse received verbal orders to increase the pre-filter fluid rate to 600 mL/hr and continue q4h K labs.  0518: INR 7.7. Elink nurse notified of critical. After the CBC resulted at 0550, this nurse called Elink an additional time to notify of hgb 7.4 and platelet 34. Dr. Warrick Parisian then ordered a second unit of FFP now and q4h H&H starting at 1000.

## 2023-07-15 NOTE — Progress Notes (Addendum)
 NAME:  Randy Allen, MRN:  [id number], DOB:  08-12-1960, LOS: 8 ADMISSION DATE:  07/02/2023, CONSULTATION DATE:  06/18/2023 REFERRING MD:  CCS , CHIEF COMPLAINT:  encephalopathy   History of Present Illness:  63 year old man status post Whipple procedure 3/19 who were seen for clinical decompensation with encephalopathy tachypnea hypotension concern for septic versus Magick shock.  At time of my arrival patient is encephalopathic.  Very tachypneic.  Tachycardic to 140s.  Hypotensive.  Multiple pushes of phenylephrine.  Blood pressures improved.  Able to tolerate etomidate for intubation.  Good view.  Placed without difficulty.  Somewhat fresh blood noted in the esophagus during intubation.  Vasopressor dose escalated.  Central line arterial line placed.  Port was accessed.  Repeat labs sent.  Concern for septic shock and reactive shock.  Possible severe acidosis, acidemia given clinical picture.  Discussed with surgery at bedside.  Plan for repeat imaging then next steps pending imaging findings.  Pertinent  Medical History  Pancreatic cancer status post Whipple 07/02/2023  Significant Hospital Events: Including procedures, antibiotic start and stop dates in addition to other pertinent events   3/19 whippple 3/24 shock, AMS, intubated. 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 3/25 L pigtail chest tube placed for effusion/empyema. Echo 70-75%, collapsed IVC with >50% resp variability, RA pressure 3, RV mod enlarged 3/26 pressor requirements improved some but remains on Vaso  and NE. Renal function worse, minimal UOP. Started on CRRT.  3/27 LFTs worse, persistent coagulopathy, worsening thrombocytopenia, hgb trending down, remains acidotic and hyperkalemic in spite of CRRT. Pressor requirements: about same. GCS 3 off sedation   Interim History / Subjective:  Progressive MODS over night. GCS3   Objective   Blood pressure 124/60, pulse 78, temperature (!) 96.7 F (35.9 C), temperature source Axillary, resp. rate (!) 28, height 5\' 8"  (1.727 m), weight 69.3 kg, SpO2 90%. CVP:  [4 mmHg-14 mmHg] 14 mmHg  Vent Mode: AC;PRVC FiO2 (%):  [45 %] 45 % Set Rate:  [26 bmp] 26 bmp Vt Set:  [550 mL] 550 mL PEEP:  [8 cmH20] 8 cmH20 Plateau Pressure:  [21 cmH20] 21 cmH20   Intake/Output Summary (Last 24 hours) at 06/15/2023 0753 Last data filed at 06/18/2023 0700 Gross per 24 hour  Intake 6251.8 ml  Output 812 ml  Net 5439.8 ml   Filed Weights   07/08/23 0500 07/09/23 0500 07/04/2023 0500  Weight: 61.7 kg 63.5 kg 69.3 kg    Examination: General this is a critically Ill comatose male who remains in refractory shock w/ MOF HENT orally intubated. Sclera are icteric  Pulm clear, dec'd bases left CT ~20cc last 24 hrs. ABG reviewed. Has on-going metabolic acidosis w/ Ph still 4.09 even w/ hyperventilation re: his Ve. His CXR on 3/26 was fairly clear  Card rrr. Mottled. Cool faint pulses. Remains on pressors but decreased dose requirements since adding vaso and stress dose steroids Neuro GCS 3 unresponsive to pain  Abd soft. Mid abd incision staples approximated. No bowel sounds. Right perc drain w/ brown drainage.  Derm diffuse ecchymosis, mottled. 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 Bilateral pleural effusions L > R - s/p L pigtail chest tube 3/25. Left exudative pleural effusion  plan Cont full vent support  PAD protocol RASS goal -1 VAP  bundle CT to sxn and cont to monitor output can be removed when < 50cc over 24 hrs for 2-3 consecutive days Am CXR   Septic shock with presumed intra-abdominal source status post recent surgical procedure, Whipple c/b severe lactic acidosis and MODS : Murky dark fluid noted coming from JP drain as well as oozing from around site of GJ tube insertion. Also some concern empyema/complicated pleural effusion (some fibrinous material noted on POCUS), s/p chest tube placement 3/25. -culture neg to date -wbc still high but a little improved.  -I am worried about ischemic gut  Plan  Continue broad spectrum antimicrobial coverage w/ vanc/merrem/diflucan day 4 of x  Titrate NE for map > 65 cont shock dose vaspressin  Cont stress dose steroids  Keep euvolemic (CVP 4 so there is room for volume) Check scvo2 Transfuse as needed. (Hyperkalemia complicates this)  Ct head and abd. If something that can be surgically intervened and CT head neg would be worth while replacing products to get to ER   AKI -/2 2 shock and hypovolemia c/b severe lactic acidosis and hyperkalemia, hypocalcemia  Now almost 10 liters positive. Still very hyperkalemic in spite of CRRT. Worried about on-going tissue ischemia  Started CRRT 3/26 Plan CRRT per nephro Replacing Ca this may also help contractility  Repeat lactic acid today  Serial labs  Acute metabolic encephalopathy  He is in acute liver failure Plan Check ammonia Will need CT head  Need to discuss w/ surgical team   Acute liver failure w/ rising Transaminitis, coagulopathy  & thrombocytopenia presumed from shock/low flow state Plan Treating shock  Trend LFTs Repeat Vit K, Trend INR Ck DIC panel  Will need platelets and Cryo if further procedures needed Start N acetylcysteine   Acute blood loss anemia  Post-op and prob c/b his coagulopathy. Sig drop over night but also now 10 liters positive  Plan Trend CBC Ideally needs blood will recheck K. 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 Plan F/u oncology outpt  Drain management per surgical team  May need re-exploration  [facility] (right click and "Reselect all SmartList Selections" daily)  Per primary   Critical care time: 66 min

## 2023-07-15 NOTE — Progress Notes (Signed)
 [facility] Progress Note  Name: Randy Allen MRN: [id number] DOB: [birth year]   Subjective:  He has been on CRRT.  I reached out to surgery on 3/26 about concern for ischemic bowel.  Has still been hyperkalemic despite 0K bath.  He had 32 mL UOP over 3/26 charted.  He has had zero UF with CRRT.  He has had dark stools; dark output from OG tube.  Spoke with nurse and with critical care, who just got off the phone with surgery.  Patient with worsening liver failure.  He has been on levo at 10 mcg/min and vasopressin at 0.025 units/min.    Review of systems:  Unable to obtain secondary to obtunded  ------------------- Background on consult:  Randy Allen is a 63 y.o. male with medical hx of HTN, HLD, Hypothyroidism and adenocarcinoma of the pancreatic head who presented for whipple procedure this hospitalization after 8 rounds of chemotherapy. His surgery was on 03/19. His post-op course was completed by anastomotic bleeding and hematemesis. His course was further complicated by hypotension, tachycardia and tachypnea and he was moved to the ICU. He was intubated in the ICU as there was concern for airway protection. Currently in the ICU, he is on multiple pressors including levophed and vasopressin. Concern is for septic vs hemorrhagic shock. His renal function has declined in the recent days and our team has been consulted to manage his AKI. Pt doesn't have any renal insufficiency at baseline. 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      Intake/Output Summary (Last 24 hours) at 07/07/2023 0852 Last data filed at 06/14/2023 0847 Gross per 24 hour  Intake 6622.62 ml  Output 812 ml  Net 5810.62 ml    Vitals:  Vitals:   06/17/2023 0719 06/16/2023 0742 07/07/2023 0800 06/22/2023 0847  BP: 124/60     Pulse: 76 78  82  Resp: (!) 29 (!) 28    Temp: (!) 96.7  F (35.9 C)  (!) 97.4 F (36.3 C) (!) 97.3 F (36.3 C)  TempSrc: Axillary  Axillary Axillary  SpO2:  90%    Weight:      Height:         Physical Exam:  General adult male in bed critically ill HEENT normocephalic atraumatic extraocular movements intact sclera anicteric Neck supple trachea midline Lungs coarse mechanical breath sounds FiO2 45 and PEEP of 8 Heart S1-S2 no rub Abdomen soft nontender midline abdominal incision is stapled has a JP drain Extremities no pitting edema; mottled GU foley in place with scant urine Access: right internal jugular nontunneled dialysis catheter   Medications reviewed   Labs:     Latest Ref Rng & Units 06/18/2023    6:39 AM 07/03/2023    6:03 AM 06/24/2023    4:03 AM  BMP  Glucose 70 - 99 mg/dL   478   BUN 8 - 23 mg/dL   32   Creatinine 2.95 - 1.24 mg/dL   6.21   Sodium 308 - 657 mmol/L  129  133   Potassium 3.5 - 5.1 mmol/L 7.2  6.5  6.8   Chloride 98 - 111 mmol/L   98   CO2 22 - 32 mmol/L   10   Calcium 8.9 - 10.3 mg/dL   6.5      Assessment/Plan:   Hyperkalemia - Continue CRRT - Change post-filter fluids to  bicarb - on bicarb gtt - concern for ischemic gut - check potassium every 4 hours  AKI - Multifactorial secondary to ischemic ATN with hypotension and now compounded by contrast and prerenal losses - Continue CRRT; change to keep even as tolerated   Metabolic acidosis - on CRRT as above   Septic shock As well as hemorrhagic shock Pressors per primary team and critical care PRBCs per primary team Antibiotics per primary team and critical care   Acute hypoxic respiratory failure On mechanical ventilation   Transaminitis with shock liver Trend LFTs per primary team   Anemia secondary to GI losses PRBCs per primary team and critical care    Hyperphosphatemia  - secondary to AKI - improved; watch for need for repletion    Pancreatic cancer Patient is status post Whipple procedure and previously  chemotherapy   Spoke with patient's wife and critical care.  Critical care has just gotten off the phone with surgery.  Concern for grave prognosis.  His wife is coming between 10-10:30 am.   Estanislado Emms, MD 07/11/2023 9:19 AM

## 2023-07-15 NOTE — Accreditation Note (Signed)
 Restraint death after removal of bilateral soft wrist restraints logged on 3/31//2025 at 0920 by Laurene Footman RN BSN.

## 2023-07-15 NOTE — IPAL (Signed)
  Interdisciplinary Goals of Care Family Meeting   Date carried out: 06/20/2023  Location of the meeting: Bedside  Member's involved: Nurse Practitioner, Bedside Registered Nurse, and Family Member or next of kin  Durable Power of Attorney or acting medical decision maker: Albesa Seen Orren   Discussion: We discussed goals of care for Randy Allen .  This is a 63 year old male who is s/p whipple. Now in MODS from septic shock c/b worsening thrombocytopenia, coagulopathy and now acute liver failure w ammonia level 146. We discussed how things have deteriorated and current plan of CT imaging eval for 1) spont cerebral bleed, cerebral edema 2) anything in abd that would be amendable to surgery    Code status:   Code Status: Limited: Do not attempt resuscitation (DNR) -DNR-LIMITED -Do Not Intubate/DNI    Disposition: Continue current acute care, DNR if arrests. If there has been any structural neurological injury such as bleeding or cerebral edema would consider wd of care   Time spent for the meeting: 32 min     Shelby Mattocks, NP  07/06/2023, 10:33 AM

## 2023-07-15 DEATH — deceased

## 2023-07-22 ENCOUNTER — Encounter: Payer: BC Managed Care – PPO | Admitting: Internal Medicine

## 2023-07-24 ENCOUNTER — Other Ambulatory Visit

## 2023-07-24 ENCOUNTER — Ambulatory Visit: Admitting: Oncology

## 2023-07-30 ENCOUNTER — Encounter: Payer: BC Managed Care – PPO | Admitting: Internal Medicine

## 2023-08-14 ENCOUNTER — Encounter: Payer: BC Managed Care – PPO | Admitting: Internal Medicine

## 2023-08-15 ENCOUNTER — Encounter: Payer: Self-pay | Admitting: Family Medicine
